# Patient Record
Sex: Male | Born: 1940 | Race: White | Hispanic: No | Smoking: Current every day smoker
Health system: Southern US, Community
[De-identification: ages and names within clinical notes are randomized; demographics above are authoritative.]

## PROBLEM LIST (undated history)

## (undated) DIAGNOSIS — R0600 Dyspnea, unspecified: Secondary | ICD-10-CM

## (undated) DIAGNOSIS — F419 Anxiety disorder, unspecified: Secondary | ICD-10-CM

## (undated) DIAGNOSIS — I10 Essential (primary) hypertension: Secondary | ICD-10-CM

## (undated) DIAGNOSIS — E7800 Pure hypercholesterolemia, unspecified: Secondary | ICD-10-CM

## (undated) DIAGNOSIS — R002 Palpitations: Secondary | ICD-10-CM

## (undated) DIAGNOSIS — K219 Gastro-esophageal reflux disease without esophagitis: Secondary | ICD-10-CM

## (undated) DIAGNOSIS — F32A Depression, unspecified: Secondary | ICD-10-CM

## (undated) DIAGNOSIS — F329 Major depressive disorder, single episode, unspecified: Secondary | ICD-10-CM

## (undated) HISTORY — PX: APPENDECTOMY: SHX54

## (undated) HISTORY — DX: Anxiety disorder, unspecified: F41.9

## (undated) HISTORY — DX: Palpitations: R00.2

## (undated) HISTORY — DX: Dyspnea, unspecified: R06.00

## (undated) HISTORY — DX: Depression, unspecified: F32.A

## (undated) HISTORY — DX: Major depressive disorder, single episode, unspecified: F32.9

---

## 2008-02-25 ENCOUNTER — Ambulatory Visit: Payer: Self-pay | Admitting: Cardiology

## 2008-03-02 ENCOUNTER — Ambulatory Visit: Payer: Self-pay | Admitting: Cardiology

## 2008-03-24 ENCOUNTER — Ambulatory Visit: Payer: Self-pay | Admitting: Cardiology

## 2008-04-06 ENCOUNTER — Ambulatory Visit: Payer: Self-pay | Admitting: Cardiology

## 2009-02-06 DIAGNOSIS — R002 Palpitations: Secondary | ICD-10-CM

## 2009-02-06 DIAGNOSIS — R0602 Shortness of breath: Secondary | ICD-10-CM

## 2010-07-02 ENCOUNTER — Ambulatory Visit: Admit: 2010-07-02 | Payer: Self-pay | Admitting: Internal Medicine

## 2010-10-15 NOTE — Assessment & Plan Note (Signed)
[facility]                          [facility] OFFICE NOTE   SHERRELL, FARISH                      MRN:          [id number]  DATE:02/25/2008                            DOB:          1940-08-16    [name] is a 70 year old gentleman, who I am asked to evaluate for  palpitations.  He also has dyspnea.  The patient has a long history of  palpitations occurring since he was a teenager.  They are described as a  skipping.   They also are described as a sudden flutter.  They do  not appear to be sustained.  They are not associated with any particular  activity.  There is no associated dyspnea, chest pain, or syncope.  He  does state that he feels nervous when he has these.  He has been taking  Inderal for years for these particular episodes.  They have increased in  frequency recently and we were asked to further evaluate.  Note, he does  have some dyspnea on exertion.  There is no orthopnea, PND, pedal edema,  chest pain, or history of syncope.   MEDICATIONS:  1. Inderal 60 mg p.o. b.i.d.  2. Flomax 0.4 mg p.o. daily.  3. Spiriva.  4. He also takes BC powders, sinus pill, tramadol, and Xanax as      needed.   ALLERGIES:  He has an allergy to PENICILLIN.   SOCIAL HISTORY:  He has a long history of tobacco abuse.  He does not  consume alcohol.  He does consume significant amounts of caffeine.   FAMILY HISTORY:  Significant for his father who had a myocardial  infarction, but this appears to be in his late 77s.  He has other  siblings who have palpitations.   PAST MEDICAL HISTORY:  There is no diabetes mellitus, hypertension, or  hyperlipidemia.  He does have COPD.  He has a history of anxiety and  depression.  He has had a previous appendectomy.   REVIEW OF SYSTEMS:  He denies any headaches or fevers or chills.  There  is no productive cough or hemoptysis.  There is no dysphagia,  odynophagia, melena, or hematochezia.  There is no  dysuria or hematuria.  There is no rashes or seizure activity.  There is no orthopnea, PND, or  pedal edema.  There is no claudication noted.  Remaining systems are  negative.   PHYSICAL EXAMINATION:  VITAL SIGNS:  Today, shows a blood pressure that  is elevated to 182/100.  His pulse is 78.  He weighs 129 pounds.  GENERAL:  He is well developed and somewhat frail.  He is in no acute  distress at present.  SKIN:  Warm and dry.  He does not appear to be depressed.  There is no  peripheral clubbing.  BACK:  Normal.  Note, he does have some staining on his fingers from his tobacco use.  HEENT:  Normal with normal eyelids.  NECK:  Supple with normal upstroke bilaterally.  No bruits noted.  There  is no jugular venous distention.  I cannot appreciate thyromegaly.  CHEST:  Diminished breath sounds throughout with a mild expiratory  wheeze.  CARDIOVASCULAR:  Regular rate and rhythm.  Normal S1 and S2.  No  murmurs, rubs, or gallops noted.  ABDOMEN:  Nontender and nondistended.  Positive bowel sounds.  No  hepatosplenomegaly.  No mass appreciated.  There is no abdominal bruit.  He has 2+ femoral pulses bilaterally.  No bruits.  EXTREMITIES:  No edema.  I could palpate no cords.  Distal pulses are  diminished.  NEUROLOGIC:  Grossly intact.   His electrocardiogram shows a sinus rhythm at a rate of 83.  The axis is  normal.  There are no significant ST changes.   DIAGNOSES:  1. Palpitations - we will plan to proceed with a CardioNet monitor to      more fully evaluate, although these sound to be most likely      premature ventricular contractions.  We will also schedule him to      have an echocardiogram to quantify his left ventricular function,      as well as a BMET and a TSH.  I will increase his Inderal to 120 mg      p.o. b.i.d., both blood pressure and his palpitations.  We will see      him back in 6 weeks to review the above.  2. Dyspnea - this is most likely due to his chronic  obstructive      pulmonary disease.  3. Tobacco abuse - we discussed the importance of discontinuing this      for between 3-10 minutes.  4. Anxiety/depression.     Madolyn Frieze Jens Som, MD, [facility]  Electronically Signed    BSC/MedQ  DD: 02/25/2008  DT: 02/25/2008  Job #: [id number]   cc:   Samuel Jester

## 2011-11-19 ENCOUNTER — Encounter: Payer: Self-pay | Admitting: *Deleted

## 2012-04-19 ENCOUNTER — Encounter (HOSPITAL_COMMUNITY): Payer: Self-pay | Admitting: Pharmacy Technician

## 2012-04-23 NOTE — Patient Instructions (Addendum)
Your procedure is scheduled on:  05/06/2012  Report to [facility] at   6;15   AM.  Call this number if you have problems the morning of surgery: [phone]   Remember:   Do not eat or drink :After Midnight.    Take these medicines the morning of surgery with A SIP OF WATER: Pantoprazole, propranolol, and Spiriva   Do not wear jewelry, make-up or nail polish.  Do not wear lotions, powders, or perfumes. You may wear deodorant.  Do not shave 48 hours prior to surgery.  Do not bring valuables to the hospital.  Contacts, dentures or bridgework may not be worn into surgery.  Patients discharged the day of surgery will not be allowed to drive home.  Name and phone number of your driver:    Please read over the following fact sheets that you were given: Pain Booklet, Surgical Site Infection Prevention, Anesthesia Post-op Instructions and Care and Recovery After Surgery  Cataract Surgery  A cataract is a clouding of the lens of the eye. When a lens becomes cloudy, vision is reduced based on the degree and nature of the clouding. Surgery may be needed to improve vision. Surgery removes the cloudy lens and usually replaces it with a substitute lens (intraocular lens, IOL). LET YOUR EYE DOCTOR KNOW ABOUT:  Allergies to food or medicine.   Medicines taken including herbs, eyedrops, over-the-counter medicines, and creams.   Use of steroids (by mouth or creams).   Previous problems with anesthetics or numbing medicine.   History of bleeding problems or blood clots.   Previous surgery.   Other health problems, including diabetes and kidney problems.   Possibility of pregnancy, if this applies.  RISKS AND COMPLICATIONS  Infection.   Inflammation of the eyeball (endophthalmitis) that can spread to both eyes (sympathetic ophthalmia).   Poor wound healing.   If an IOL is inserted, it can later fall out of proper position. This is very uncommon.   Clouding of the part of your eye that  holds an IOL in place. This is called an "after-cataract." These are uncommon, but easily treated.  BEFORE THE PROCEDURE  Do not eat or drink anything except small amounts of water for 8 to 12 before your surgery, or as directed by your caregiver.   Unless you are told otherwise, continue any eyedrops you have been prescribed.   Talk to your primary caregiver about all other medicines that you take (both prescription and non-prescription). In some cases, you may need to stop or change medicines near the time of your surgery. This is most important if you are taking blood-thinning medicine.Do not stop medicines unless you are told to do so.   Arrange for someone to drive you to and from the procedure.   Do not put contact lenses in either eye on the day of your surgery.  PROCEDURE There is more than one method for safely removing a cataract. Your doctor can explain the differences and help determine which is best for you. Phacoemulsification surgery is the most common form of cataract surgery.  An injection is given behind the eye or eyedrops are given to make this a painless procedure.   A small cut (incision) is made on the edge of the clear, dome-shaped surface that covers the front of the eye (cornea).   A tiny probe is painlessly inserted into the eye. This device gives off ultrasound waves that soften and break up the cloudy center of the lens. This makes  it easier for the cloudy lens to be removed by suction.   An IOL may be implanted.   The normal lens of the eye is covered by a clear capsule. Part of that capsule is intentionally left in the eye to support the IOL.   Your surgeon may or may not use stitches to close the incision.  There are other forms of cataract surgery that require a larger incision and stiches to close the eye. This approach is taken in cases where the doctor feels that the cataract cannot be easily removed using phacoemulsification. AFTER THE  PROCEDURE  When an IOL is implanted, it does not need care. It becomes a permanent part of your eye and cannot be seen or felt.   Your doctor will schedule follow-up exams to check on your progress.   Review your other medicines with your doctor to see which can be resumed after surgery.   Use eyedrops or take medicine as prescribed by your doctor.  Document Released: 05/08/2011 Document Reviewed: 05/05/2011 [facility] Patient Information 2012 [place].  .Cataract Surgery Care After Refer to this sheet in the next few weeks. These instructions provide you with information on caring for yourself after your procedure. Your caregiver may also give you more specific instructions. Your treatment has been planned according to current medical practices, but problems sometimes occur. Call your caregiver if you have any problems or questions after your procedure.  HOME CARE INSTRUCTIONS   Avoid strenuous activities as directed by your caregiver.   Ask your caregiver when you can resume driving.   Use eyedrops or other medicines to help healing and control pressure inside your eye as directed by your caregiver.   Only take over-the-counter or prescription medicines for pain, discomfort, or fever as directed by your caregiver.   Do not to touch or rub your eyes.   You may be instructed to use a protective shield during the first few days and nights after surgery. If not, wear sunglasses to protect your eyes. This is to protect the eye from pressure or from being accidentally bumped.   Keep the area around your eye clean and dry. Avoid swimming or allowing water to hit you directly in the face while showering. Keep soap and shampoo out of your eyes.   Do not bend or lift heavy objects. Bending increases pressure in the eye. You can walk, climb stairs, and do light household chores.   Do not put a contact lens into the eye that had surgery until your caregiver says it is okay to do so.    Ask your doctor when you can return to work. This will depend on the kind of work that you do. If you work in a dusty environment, you may be advised to wear protective eyewear for a period of time.   Ask your caregiver when it will be safe to engage in sexual activity.   Continue with your regular eye exams as directed by your caregiver.  What to expect:  It is normal to feel itching and mild discomfort for a few days after cataract surgery. Some fluid discharge is also common, and your eye may be sensitive to light and touch.   After 1 to 2 days, even moderate discomfort should disappear. In most cases, healing will take about 6 weeks.   If you received an intraocular lens (IOL), you may notice that colors are very bright or have a blue tinge. Also, if you have been in bright sunlight,  everything may appear reddish for a few hours. If you see these color tinges, it is because your lens is clear and no longer cloudy. Within a few months after receiving an IOL, these extra colors should go away. When you have healed, you will probably need new glasses.  SEEK MEDICAL CARE IF:   You have increased bruising around your eye.   You have discomfort not helped by medicine.  SEEK IMMEDIATE MEDICAL CARE IF:   You have a fever.   You have a worsening or sudden vision loss.   You have redness, swelling, or increasing pain in the eye.   You have a thick discharge from the eye that had surgery.  MAKE SURE YOU:  Understand these instructions.   Will watch your condition.   Will get help right away if you are not doing well or get worse.  Document Released: 12/06/2004 Document Revised: 05/08/2011 Document Reviewed: 01/10/2011 [facility] Patient Information 2012 [place], [place].

## 2012-04-26 ENCOUNTER — Encounter (HOSPITAL_COMMUNITY): Payer: Self-pay

## 2012-04-26 ENCOUNTER — Other Ambulatory Visit: Payer: Self-pay

## 2012-04-26 ENCOUNTER — Encounter (HOSPITAL_COMMUNITY)
Admission: RE | Admit: 2012-04-26 | Discharge: 2012-04-26 | Disposition: A | Discharge status: Home / Self Care | Payer: Medicare Other | Source: Ambulatory Visit | Attending: Ophthalmology | Admitting: Ophthalmology

## 2012-04-26 HISTORY — DX: Essential (primary) hypertension: I10

## 2012-04-26 HISTORY — DX: Gastro-esophageal reflux disease without esophagitis: K21.9

## 2012-04-26 HISTORY — DX: Pure hypercholesterolemia, unspecified: E78.00

## 2012-04-26 LAB — HEMOGLOBIN AND HEMATOCRIT, BLOOD: Hemoglobin: 13.6 g/dL (ref 13.0–17.0)

## 2012-04-26 LAB — BASIC METABOLIC PANEL
BUN: 12 mg/dL (ref 6–23)
Calcium: 9.5 mg/dL (ref 8.4–10.5)
GFR calc non Af Amer: 88 mL/min — ABNORMAL LOW (ref >90)
Glucose, Bld: 123 mg/dL — ABNORMAL HIGH (ref 70–99)

## 2012-05-05 MED ORDER — TETRACAINE HCL 0.5 % OP SOLN
OPHTHALMIC | Status: AC
  Filled 2012-05-05: qty 2

## 2012-05-05 MED ORDER — CYCLOPENTOLATE-PHENYLEPHRINE 0.2-1 % OP SOLN
OPHTHALMIC | Status: AC
  Filled 2012-05-05: qty 2

## 2012-05-05 MED ORDER — LIDOCAINE HCL (PF) 1 % IJ SOLN
INTRAMUSCULAR | Status: AC
  Filled 2012-05-05: qty 2

## 2012-05-05 MED ORDER — NEOMYCIN-POLYMYXIN-DEXAMETH 3.5-10000-0.1 OP OINT
TOPICAL_OINTMENT | OPHTHALMIC | Status: AC
  Filled 2012-05-05: qty 3.5

## 2012-05-05 MED ORDER — PHENYLEPHRINE HCL 2.5 % OP SOLN
OPHTHALMIC | Status: AC
  Filled 2012-05-05: qty 2

## 2012-05-05 MED ORDER — LIDOCAINE HCL 3.5 % OP GEL
OPHTHALMIC | Status: AC
  Filled 2012-05-05: qty 5

## 2012-05-06 ENCOUNTER — Encounter (HOSPITAL_COMMUNITY): Payer: Self-pay | Admitting: Anesthesiology

## 2012-05-06 ENCOUNTER — Ambulatory Visit (HOSPITAL_COMMUNITY): Payer: Medicare Other | Admitting: Anesthesiology

## 2012-05-06 ENCOUNTER — Encounter (HOSPITAL_COMMUNITY)
Admission: RE | Disposition: A | Discharge status: Home / Self Care | Payer: Self-pay | Source: Ambulatory Visit | Attending: Ophthalmology

## 2012-05-06 ENCOUNTER — Encounter (HOSPITAL_COMMUNITY): Payer: Self-pay | Admitting: *Deleted

## 2012-05-06 ENCOUNTER — Ambulatory Visit (HOSPITAL_COMMUNITY)
Admission: RE | Admit: 2012-05-06 | Discharge: 2012-05-06 | Disposition: A | Discharge status: Home / Self Care | Payer: Medicare Other | Source: Ambulatory Visit | Attending: Ophthalmology | Admitting: Ophthalmology

## 2012-05-06 DIAGNOSIS — Z01812 Encounter for preprocedural laboratory examination: Secondary | ICD-10-CM | POA: Insufficient documentation

## 2012-05-06 DIAGNOSIS — I10 Essential (primary) hypertension: Secondary | ICD-10-CM | POA: Insufficient documentation

## 2012-05-06 DIAGNOSIS — J4489 Other specified chronic obstructive pulmonary disease: Secondary | ICD-10-CM | POA: Insufficient documentation

## 2012-05-06 DIAGNOSIS — Z01810 Encounter for preprocedural cardiovascular examination: Secondary | ICD-10-CM | POA: Insufficient documentation

## 2012-05-06 DIAGNOSIS — H2589 Other age-related cataract: Secondary | ICD-10-CM | POA: Insufficient documentation

## 2012-05-06 DIAGNOSIS — J449 Chronic obstructive pulmonary disease, unspecified: Secondary | ICD-10-CM | POA: Insufficient documentation

## 2012-05-06 DIAGNOSIS — F17200 Nicotine dependence, unspecified, uncomplicated: Secondary | ICD-10-CM | POA: Insufficient documentation

## 2012-05-06 HISTORY — PX: CATARACT EXTRACTION W/PHACO: SHX586

## 2012-05-06 SURGERY — PHACOEMULSIFICATION, CATARACT, WITH IOL INSERTION
Anesthesia: Monitor Anesthesia Care | Site: Eye | Laterality: Right | Wound class: Clean

## 2012-05-06 MED ORDER — LIDOCAINE HCL (PF) 1 % IJ SOLN: INTRAMUSCULAR | Status: DC | PRN

## 2012-05-06 MED ORDER — MIDAZOLAM HCL 2 MG/2ML IJ SOLN
1.0000 mg | INTRAMUSCULAR | Status: DC | PRN
  Administered 2012-05-06 (×2): 2 mg via INTRAVENOUS

## 2012-05-06 MED ORDER — MIDAZOLAM HCL 2 MG/2ML IJ SOLN
INTRAMUSCULAR | Status: AC
  Filled 2012-05-06: qty 2

## 2012-05-06 MED ORDER — MIDAZOLAM HCL 2 MG/2ML IJ SOLN
INTRAMUSCULAR | Status: AC
  Filled 2012-05-06: qty 28

## 2012-05-06 MED ORDER — EPINEPHRINE HCL 1 MG/ML IJ SOLN
INTRAOCULAR | Status: DC | PRN
  Administered 2012-05-06: 07:00:00

## 2012-05-06 MED ORDER — PROVISC 10 MG/ML IO SOLN
INTRAOCULAR | Status: DC | PRN
  Administered 2012-05-06: 8.5 mg via INTRAOCULAR

## 2012-05-06 MED ORDER — TETRACAINE HCL 0.5 % OP SOLN
1.0000 [drp] | OPHTHALMIC | Status: AC
  Administered 2012-05-06 (×3): 1 [drp] via OPHTHALMIC

## 2012-05-06 MED ORDER — LIDOCAINE HCL 3.5 % OP GEL
1.0000 "application " | Freq: Once | OPHTHALMIC | Status: AC
  Administered 2012-05-06: 1 via OPHTHALMIC

## 2012-05-06 MED ORDER — PHENYLEPHRINE HCL 2.5 % OP SOLN
1.0000 [drp] | OPHTHALMIC | Status: AC
  Administered 2012-05-06 (×3): 1 [drp] via OPHTHALMIC

## 2012-05-06 MED ORDER — CYCLOPENTOLATE-PHENYLEPHRINE 0.2-1 % OP SOLN
1.0000 [drp] | OPHTHALMIC | Status: AC
  Administered 2012-05-06 (×3): 1 [drp] via OPHTHALMIC

## 2012-05-06 MED ORDER — BSS IO SOLN
INTRAOCULAR | Status: DC | PRN
  Administered 2012-05-06: 15 mL via INTRAOCULAR

## 2012-05-06 MED ORDER — LIDOCAINE HCL (PF) 1 % IJ SOLN
INTRAOCULAR | Status: DC | PRN
  Administered 2012-05-06: 07:00:00 via OPHTHALMIC

## 2012-05-06 MED ORDER — POVIDONE-IODINE 5 % OP SOLN
OPHTHALMIC | Status: DC | PRN
  Administered 2012-05-06: 1 via OPHTHALMIC

## 2012-05-06 MED ORDER — EPINEPHRINE HCL 1 MG/ML IJ SOLN
INTRAMUSCULAR | Status: AC
  Filled 2012-05-06: qty 1

## 2012-05-06 MED ORDER — NEOMYCIN-POLYMYXIN-DEXAMETH 0.1 % OP OINT
TOPICAL_OINTMENT | OPHTHALMIC | Status: DC | PRN
  Administered 2012-05-06: 1 via OPHTHALMIC

## 2012-05-06 MED ORDER — LACTATED RINGERS IV SOLN
INTRAVENOUS | Status: DC
  Administered 2012-05-06: 1000 mL via INTRAVENOUS

## 2012-05-06 MED ORDER — LIDOCAINE 3.5 % OP GEL OPTIME - NO CHARGE
OPHTHALMIC | Status: DC | PRN
  Administered 2012-05-06: 1 [drp] via OPHTHALMIC

## 2012-05-06 SURGICAL SUPPLY — 32 items

## 2012-05-06 NOTE — Transfer of Care (Signed)
Immediate Anesthesia Transfer of Care Note  Patient: [name]  Procedure(s) Performed: Procedure(s) (LRB) with comments: CATARACT EXTRACTION PHACO AND INTRAOCULAR LENS PLACEMENT (IOC) (Right) - CDE:22.64  Patient Location: PACU and Short Stay  Anesthesia Type:MAC  Level of Consciousness: awake, alert , oriented and patient cooperative  Airway & Oxygen Therapy: Patient Spontanous Breathing  Post-op Assessment: Report given to PACU RN, Post -op Vital signs reviewed and stable and Patient moving all extremities  Post vital signs: Reviewed and stable  Complications: No apparent anesthesia complications

## 2012-05-06 NOTE — Brief Op Note (Signed)
Pre-Op Dx: Cataract OD Post-Op Dx: Cataract OD Surgeon: Kenlyn Lose Anesthesia: Topical with MAC Surgery: Cataract Extraction with Intraocular lens Implant OD Implant: B&L enVista Specimen: None Complications: None 

## 2012-05-06 NOTE — Op Note (Signed)
[name], [name]               ACCOUNT NO.:  [id number]  MEDICAL RECORD NO.:  [id number]  LOCATION:  [facility]                          FACILITY:  [facility]  PHYSICIAN:  Susanne Greenhouse, MD       DATE OF BIRTH:  Mar 24, 1941  DATE OF PROCEDURE:  05/06/2012 DATE OF DISCHARGE:  05/06/2012                              OPERATIVE REPORT   PREOPERATIVE DIAGNOSIS:  Combined cataract, right eye, diagnosis code 366.19.  POSTOPERATIVE DIAGNOSIS:  Combined cataract, right eye, diagnosis code 366.19.  OPERATION PERFORMED:  Phacoemulsification with posterior chamber intraocular lens implantation, right eye.  SURGEON:  Bonne Dolores. Theodoro Koval, MD  ANESTHESIA:  Topical with monitored anesthesia care and IV sedation.  OPERATIVE SUMMARY:  In the preoperative area, dilating drops were placed into the right eye.  The patient was then brought into the operating room where he was placed under general anesthesia.  The eye was then prepped and draped.  Beginning with a 75 blade, a paracentesis port was made at the surgeon's 2 o'clock position.  The anterior chamber was then filled with a 1% nonpreserved lidocaine solution with epinephrine.  This was followed by Viscoat to deepen the chamber.  A small fornix-based peritomy was performed superiorly.  Next, a single iris hook was placed through the limbus superiorly.  A 2.4-mm keratome blade was then used to make a clear corneal incision over the iris hook.  A bent cystotome needle and Utrata forceps were used to create a continuous tear capsulotomy.  Hydrodissection was performed using balanced salt solution on a fine cannula.  The lens nucleus was then removed using phacoemulsification in a quadrant cracking technique.  The cortical material was then removed with irrigation and aspiration.  The capsular bag and anterior chamber were refilled with Provisc.  The wound was widened to approximately 3 mm and a posterior chamber intraocular lens was placed into the capsular  bag without difficulty using an [employer] lens injecting system.  A single 10-0 nylon suture was then used to close the incision as well as stromal hydration.  The Provisc was removed from the anterior chamber and capsular bag with irrigation and aspiration.  At this point, the wounds were tested for leak, which were negative.  The anterior chamber remained deep and stable.  The patient tolerated the procedure well.  There were no operative complications, and he awoke from general anesthesia without problem.  No surgical specimens.  Prosthetic device used is a Theme park manager, model EnVista, model number MX60, power of 23.0, serial number is [id number].          ______________________________ Susanne Greenhouse, MD     KEH/MEDQ  D:  05/06/2012  T:  05/06/2012  Job:  [id number]

## 2012-05-06 NOTE — Anesthesia Postprocedure Evaluation (Signed)
  Anesthesia Post-op Note  Patient: [name]  Procedure(s) Performed: Procedure(s) (LRB) with comments: CATARACT EXTRACTION PHACO AND INTRAOCULAR LENS PLACEMENT (IOC) (Right) - CDE:22.64  Patient Location: Short Stay  Anesthesia Type:MAC  Level of Consciousness: awake, alert , oriented and patient cooperative  Airway and Oxygen Therapy: Patient Spontanous Breathing  Post-op Pain: none  Post-op Assessment: Post-op Vital signs reviewed, Patient's Cardiovascular Status Stable, Respiratory Function Stable, Patent Airway, No signs of Nausea or vomiting and Pain level controlled  Post-op Vital Signs: Reviewed and stable  Complications: No apparent anesthesia complications

## 2012-05-06 NOTE — H&P (Signed)
I have reviewed the H&P, the patient was re-examined, and I have identified no interval changes in medical condition and plan of care since the history and physical of record  

## 2012-05-06 NOTE — Anesthesia Preprocedure Evaluation (Addendum)
Anesthesia Evaluation  Patient identified by MRN, date of birth, ID band Patient awake    Reviewed: Allergy & Precautions, H&P , NPO status , Patient's Chart, lab work & pertinent test results, reviewed documented beta blocker date and time   Airway Mallampati: II TM Distance: <3 FB     Dental  (+) Edentulous Upper and Edentulous Lower   Pulmonary shortness of breath, COPDCurrent Smoker,  breath sounds clear to auscultation  - wheezing      Cardiovascular hypertension, Pt. on home beta blockers and Pt. on medications + dysrhythmias (palpitations) Rhythm:Regular     Neuro/Psych PSYCHIATRIC DISORDERS Anxiety Depression    GI/Hepatic GERD-  Medicated,  Endo/Other    Renal/GU      Musculoskeletal   Abdominal   Peds  Hematology   Anesthesia Other Findings   Reproductive/Obstetrics                           Anesthesia Physical Anesthesia Plan  ASA: III  Anesthesia Plan: MAC   Post-op Pain Management:    Induction: Intravenous  Airway Management Planned: Nasal Cannula  Additional Equipment:   Intra-op Plan:   Post-operative Plan:   Informed Consent: I have reviewed the patients History and Physical, chart, labs and discussed the procedure including the risks, benefits and alternatives for the proposed anesthesia with the patient or authorized representative who has indicated his/her understanding and acceptance.     Plan Discussed with:   Anesthesia Plan Comments:         Anesthesia Quick Evaluation  

## 2012-05-10 ENCOUNTER — Encounter (HOSPITAL_COMMUNITY): Payer: Self-pay | Admitting: Ophthalmology

## 2012-05-13 ENCOUNTER — Encounter (HOSPITAL_COMMUNITY): Payer: Self-pay | Admitting: Pharmacy Technician

## 2012-05-18 ENCOUNTER — Encounter (HOSPITAL_COMMUNITY)
Admission: RE | Admit: 2012-05-18 | Discharge: 2012-05-18 | Payer: Medicare Other | Source: Ambulatory Visit | Admitting: Ophthalmology

## 2012-05-18 MED ORDER — FENTANYL CITRATE 0.05 MG/ML IJ SOLN: 25.0000 ug | INTRAMUSCULAR | Status: DC | PRN

## 2012-05-18 MED ORDER — ONDANSETRON HCL 4 MG/2ML IJ SOLN: 4.0000 mg | Freq: Once | INTRAMUSCULAR | Status: AC | PRN

## 2012-05-18 NOTE — Patient Instructions (Signed)
22 [name]  05/18/2012   Your procedure is scheduled on:  05/24/2012  Report to [facility] at 6:30 AM.  Call this number if you have problems the morning of surgery: [phone]   Remember:   Do not eat food:After Midnight.  May have clear liquids:until Midnight .  Clear liquids include soda, tea, black coffee, apple or grape juice, broth.  Take these medicines the morning of surgery with A SIP OF WATER: Spiriva, Flomax, Inderal, Protonix, and Lexapro   Do not wear jewelry, make-up or nail polish.  Do not wear lotions, powders, or perfumes. You may wear deodorant.  Do not shave 48 hours prior to surgery. Men may shave face and neck.  Do not bring valuables to the hospital.  Contacts, dentures or bridgework may not be worn into surgery.  Leave suitcase in the car. After surgery it may be brought to your room.  For patients admitted to the hospital, checkout time is 11:00 AM the day of discharge.   Patients discharged the day of surgery will not be allowed to drive home.  Name and phone number of your driver: Family  Special Instructions: N/A   Please read over the following fact sheets that you were given: Care and Recovery After Surgery

## 2012-05-24 ENCOUNTER — Ambulatory Visit (HOSPITAL_COMMUNITY)
Admission: RE | Admit: 2012-05-24 | Discharge: 2012-05-24 | Disposition: A | Discharge status: Home / Self Care | Payer: Medicare Other | Source: Ambulatory Visit | Attending: Ophthalmology | Admitting: Ophthalmology

## 2012-05-24 ENCOUNTER — Encounter (HOSPITAL_COMMUNITY): Payer: Self-pay | Admitting: *Deleted

## 2012-05-24 ENCOUNTER — Encounter (HOSPITAL_COMMUNITY): Payer: Self-pay | Admitting: Anesthesiology

## 2012-05-24 ENCOUNTER — Encounter (HOSPITAL_COMMUNITY)
Admission: RE | Disposition: A | Discharge status: Home / Self Care | Payer: Self-pay | Source: Ambulatory Visit | Attending: Ophthalmology

## 2012-05-24 ENCOUNTER — Ambulatory Visit (HOSPITAL_COMMUNITY): Payer: Medicare Other | Admitting: Anesthesiology

## 2012-05-24 DIAGNOSIS — F17200 Nicotine dependence, unspecified, uncomplicated: Secondary | ICD-10-CM | POA: Insufficient documentation

## 2012-05-24 DIAGNOSIS — I10 Essential (primary) hypertension: Secondary | ICD-10-CM | POA: Insufficient documentation

## 2012-05-24 DIAGNOSIS — H2589 Other age-related cataract: Secondary | ICD-10-CM | POA: Insufficient documentation

## 2012-05-24 DIAGNOSIS — J4489 Other specified chronic obstructive pulmonary disease: Secondary | ICD-10-CM | POA: Insufficient documentation

## 2012-05-24 DIAGNOSIS — J449 Chronic obstructive pulmonary disease, unspecified: Secondary | ICD-10-CM | POA: Insufficient documentation

## 2012-05-24 HISTORY — PX: CATARACT EXTRACTION W/PHACO: SHX586

## 2012-05-24 SURGERY — PHACOEMULSIFICATION, CATARACT, WITH IOL INSERTION
Anesthesia: Monitor Anesthesia Care | Site: Eye | Laterality: Left | Wound class: Clean

## 2012-05-24 MED ORDER — LACTATED RINGERS IV SOLN
INTRAVENOUS | Status: DC
  Administered 2012-05-24: 1000 mL via INTRAVENOUS

## 2012-05-24 MED ORDER — CYCLOPENTOLATE-PHENYLEPHRINE 0.2-1 % OP SOLN
1.0000 [drp] | OPHTHALMIC | Status: AC
  Administered 2012-05-24 (×3): 1 [drp] via OPHTHALMIC

## 2012-05-24 MED ORDER — EPINEPHRINE HCL 1 MG/ML IJ SOLN
INTRAMUSCULAR | Status: AC
  Filled 2012-05-24: qty 1

## 2012-05-24 MED ORDER — POVIDONE-IODINE 5 % OP SOLN
OPHTHALMIC | Status: DC | PRN
  Administered 2012-05-24: 1 via OPHTHALMIC

## 2012-05-24 MED ORDER — LIDOCAINE HCL (PF) 1 % IJ SOLN: INTRAMUSCULAR | Status: DC | PRN

## 2012-05-24 MED ORDER — EPINEPHRINE HCL 1 MG/ML IJ SOLN
INTRAOCULAR | Status: DC | PRN
  Administered 2012-05-24: 08:00:00

## 2012-05-24 MED ORDER — TETRACAINE HCL 0.5 % OP SOLN
1.0000 [drp] | OPHTHALMIC | Status: AC
  Administered 2012-05-24 (×3): 1 [drp] via OPHTHALMIC

## 2012-05-24 MED ORDER — MIDAZOLAM HCL 2 MG/2ML IJ SOLN
INTRAMUSCULAR | Status: AC
  Filled 2012-05-24: qty 2

## 2012-05-24 MED ORDER — PHENYLEPHRINE HCL 2.5 % OP SOLN
1.0000 [drp] | OPHTHALMIC | Status: AC
  Administered 2012-05-24 (×3): 1 [drp] via OPHTHALMIC

## 2012-05-24 MED ORDER — LIDOCAINE HCL (PF) 1 % IJ SOLN
INTRAOCULAR | Status: DC | PRN
  Administered 2012-05-24: 08:00:00 via OPHTHALMIC

## 2012-05-24 MED ORDER — BSS IO SOLN
INTRAOCULAR | Status: DC | PRN
  Administered 2012-05-24: 15 mL via INTRAOCULAR

## 2012-05-24 MED ORDER — MIDAZOLAM HCL 2 MG/2ML IJ SOLN
1.0000 mg | INTRAMUSCULAR | Status: DC | PRN
  Administered 2012-05-24 (×2): 2 mg via INTRAVENOUS

## 2012-05-24 MED ORDER — LIDOCAINE HCL 3.5 % OP GEL
1.0000 "application " | Freq: Once | OPHTHALMIC | Status: AC
  Administered 2012-05-24: 1 via OPHTHALMIC

## 2012-05-24 MED ORDER — PROVISC 10 MG/ML IO SOLN
INTRAOCULAR | Status: DC | PRN
  Administered 2012-05-24: 8.5 mg via INTRAOCULAR

## 2012-05-24 MED ORDER — NEOMYCIN-POLYMYXIN-DEXAMETH 0.1 % OP OINT
TOPICAL_OINTMENT | OPHTHALMIC | Status: DC | PRN
  Administered 2012-05-24: 1 via OPHTHALMIC

## 2012-05-24 SURGICAL SUPPLY — 32 items

## 2012-05-24 NOTE — Anesthesia Postprocedure Evaluation (Signed)
  Anesthesia Post-op Note  Patient: [name]  Procedure(s) Performed: Procedure(s) (LRB): CATARACT EXTRACTION PHACO AND INTRAOCULAR LENS PLACEMENT (IOC) (Left)  Patient Location:  Short Stay  Anesthesia Type: MAC  Level of Consciousness: awake  Airway and Oxygen Therapy: Patient Spontanous Breathing  Post-op Pain: none  Post-op Assessment: Post-op Vital signs reviewed, Patient's Cardiovascular Status Stable, Respiratory Function Stable, Patent Airway, No signs of Nausea or vomiting and Pain level controlled  Post-op Vital Signs: Reviewed and stable  Complications: No apparent anesthesia complications

## 2012-05-24 NOTE — Anesthesia Preprocedure Evaluation (Signed)
Anesthesia Evaluation  Patient identified by MRN, date of birth, ID band Patient awake    Reviewed: Allergy & Precautions, H&P , NPO status , Patient's Chart, lab work & pertinent test results, reviewed documented beta blocker date and time   Airway Mallampati: II TM Distance: <3 FB     Dental  (+) Edentulous Upper and Edentulous Lower   Pulmonary shortness of breath, COPDCurrent Smoker,  breath sounds clear to auscultation  - wheezing      Cardiovascular hypertension, Pt. on home beta blockers and Pt. on medications + dysrhythmias (palpitations) Rhythm:Regular     Neuro/Psych PSYCHIATRIC DISORDERS Anxiety Depression    GI/Hepatic GERD-  Medicated,  Endo/Other    Renal/GU      Musculoskeletal   Abdominal   Peds  Hematology   Anesthesia Other Findings   Reproductive/Obstetrics                           Anesthesia Physical Anesthesia Plan  ASA: III  Anesthesia Plan: MAC   Post-op Pain Management:    Induction: Intravenous  Airway Management Planned: Nasal Cannula  Additional Equipment:   Intra-op Plan:   Post-operative Plan:   Informed Consent: I have reviewed the patients History and Physical, chart, labs and discussed the procedure including the risks, benefits and alternatives for the proposed anesthesia with the patient or authorized representative who has indicated his/her understanding and acceptance.     Plan Discussed with:   Anesthesia Plan Comments:         Anesthesia Quick Evaluation

## 2012-05-24 NOTE — Anesthesia Procedure Notes (Signed)
Procedure Name: MAC Date/Time: 05/24/2012 7:50 AM Performed by: Franco Nones Pre-anesthesia Checklist: Patient identified, Emergency Drugs available, Suction available, Timeout performed and Patient being monitored Patient Re-evaluated:Patient Re-evaluated prior to inductionOxygen Delivery Method: Nasal Cannula

## 2012-05-24 NOTE — Op Note (Signed)
[name], [name]               ACCOUNT NO.:  [id number]  MEDICAL RECORD NO.:  [id number]  LOCATION:  [facility]                          FACILITY:  [facility]  PHYSICIAN:  Susanne Greenhouse, MD       DATE OF BIRTH:  01-03-1941  DATE OF PROCEDURE:  05/24/2012 DATE OF DISCHARGE:  05/24/2012                              OPERATIVE REPORT   PREOPERATIVE DIAGNOSIS:  Combined cataract, left eye, diagnosis code 366.19.  POSTOPERATIVE DIAGNOSIS:  Combined cataract, left eye, diagnosis code 366.19.  OPERATION PERFORMED:  Phacoemulsification with posterior chamber intraocular lens implantation, left eye.  SURGEON:  Bonne Dolores. Leary Mcnulty, MD  ANESTHESIA:  Topical with monitored anesthesia care and IV sedation.  OPERATIVE SUMMARY:  In the preoperative area, dilating drops were placed into the left eye.  The patient was then brought into the operating room where he was placed under general anesthesia.  The eye was then prepped and draped.  Beginning with a 75 blade, a paracentesis port was made at the surgeon's 2 o'clock position.  The anterior chamber was then filled with a 1% nonpreserved lidocaine solution with epinephrine.  This was followed by Viscoat to deepen the chamber.  A small fornix-based peritomy was performed superiorly.  Next, a single iris hook was placed through the limbus superiorly.  A 2.4-mm keratome blade was then used to make a clear corneal incision over the iris hook.  A bent cystotome needle and Utrata forceps were used to create a continuous tear capsulotomy.  Hydrodissection was performed using balanced salt solution on a fine cannula.  The lens nucleus was then removed using phacoemulsification in a quadrant cracking technique.  The cortical material was then removed with irrigation and aspiration.  The capsular bag and anterior chamber were refilled with Provisc.  The wound was widened to approximately 3 mm and a posterior chamber intraocular lens was placed into the capsular bag  without difficulty using an [employer] lens injecting system.  A single 10-0 nylon suture was then used to close the incision as well as stromal hydration.  The Provisc was removed from the anterior chamber and capsular bag with irrigation and aspiration.  At this point, the wounds were tested for leak, which were negative.  The anterior chamber remained deep and stable.  The patient tolerated the procedure well.  There were no operative complications, and he awoke from general anesthesia without problem.  No surgical specimens.  Prosthetic device used is a Theme park manager, model EnVista, model number MX60, power of 22.5, serial number is [id number].          ______________________________ Susanne Greenhouse, MD     KEH/MEDQ  D:  05/24/2012  T:  05/24/2012  Job:  [id number]

## 2012-05-24 NOTE — H&P (Signed)
I have reviewed the H&P, the patient was re-examined, and I have identified no interval changes in medical condition and plan of care since the history and physical of record  

## 2012-05-24 NOTE — Brief Op Note (Signed)
Pre-Op Dx: Cataract OS Post-Op Dx: Cataract OS Surgeon: Nelson Noone Anesthesia: Topical with MAC Surgery: Cataract Extraction with Intraocular lens Implant OS Implant: B&L enVista Specimen: None Complications: None 

## 2012-05-24 NOTE — Transfer of Care (Signed)
Immediate Anesthesia Transfer of Care Note  Patient: [name]  Procedure(s) Performed: Procedure(s) (LRB): CATARACT EXTRACTION PHACO AND INTRAOCULAR LENS PLACEMENT (IOC) (Left)  Patient Location: Shortstay  Anesthesia Type: MAC  Level of Consciousness: awake  Airway & Oxygen Therapy: Patient Spontanous Breathing   Post-op Assessment: Report given to PACU RN, Post -op Vital signs reviewed and stable and Patient moving all extremities  Post vital signs: Reviewed and stable  Complications: No apparent anesthesia complications

## 2012-05-31 ENCOUNTER — Encounter (HOSPITAL_COMMUNITY): Payer: Self-pay | Admitting: Ophthalmology

## 2014-09-13 ENCOUNTER — Inpatient Hospital Stay (HOSPITAL_COMMUNITY): Payer: Medicare Other | Admitting: Anesthesiology

## 2014-09-13 ENCOUNTER — Encounter (HOSPITAL_COMMUNITY)
Admission: EM | Disposition: A | Discharge status: Nursing Home (Includes ICF) | Payer: Medicare Other | Source: Home / Self Care | Attending: Cardiothoracic Surgery

## 2014-09-13 ENCOUNTER — Ambulatory Visit (HOSPITAL_COMMUNITY): Admit: 2014-09-13 | Payer: Self-pay | Admitting: Interventional Cardiology

## 2014-09-13 ENCOUNTER — Encounter (HOSPITAL_COMMUNITY): Payer: Self-pay

## 2014-09-13 ENCOUNTER — Inpatient Hospital Stay (HOSPITAL_COMMUNITY)
Admission: EM | Admit: 2014-09-13 | Discharge: 2014-10-05 | DRG: 003 | Disposition: A | Discharge status: Other Acute Inpatient Hospital | Payer: Medicare Other | Attending: Cardiothoracic Surgery | Admitting: Cardiothoracic Surgery

## 2014-09-13 ENCOUNTER — Emergency Department (HOSPITAL_COMMUNITY): Payer: Medicare Other

## 2014-09-13 ENCOUNTER — Inpatient Hospital Stay (HOSPITAL_COMMUNITY): Payer: Medicare Other

## 2014-09-13 ENCOUNTER — Other Ambulatory Visit (HOSPITAL_COMMUNITY): Payer: Medicare Other

## 2014-09-13 ENCOUNTER — Encounter (HOSPITAL_COMMUNITY)
Admission: EM | Disposition: A | Discharge status: Nursing Home (Includes ICF) | Payer: Self-pay | Source: Home / Self Care | Attending: Cardiothoracic Surgery

## 2014-09-13 DIAGNOSIS — G9341 Metabolic encephalopathy: Secondary | ICD-10-CM | POA: Diagnosis not present

## 2014-09-13 DIAGNOSIS — Z951 Presence of aortocoronary bypass graft: Secondary | ICD-10-CM

## 2014-09-13 DIAGNOSIS — F419 Anxiety disorder, unspecified: Secondary | ICD-10-CM | POA: Diagnosis present

## 2014-09-13 DIAGNOSIS — K8010 Calculus of gallbladder with chronic cholecystitis without obstruction: Secondary | ICD-10-CM | POA: Diagnosis present

## 2014-09-13 DIAGNOSIS — I9589 Other hypotension: Secondary | ICD-10-CM | POA: Insufficient documentation

## 2014-09-13 DIAGNOSIS — Z880 Allergy status to penicillin: Secondary | ICD-10-CM

## 2014-09-13 DIAGNOSIS — Z978 Presence of other specified devices: Secondary | ICD-10-CM | POA: Insufficient documentation

## 2014-09-13 DIAGNOSIS — J690 Pneumonitis due to inhalation of food and vomit: Secondary | ICD-10-CM | POA: Diagnosis not present

## 2014-09-13 DIAGNOSIS — E780 Pure hypercholesterolemia: Secondary | ICD-10-CM | POA: Diagnosis present

## 2014-09-13 DIAGNOSIS — R1110 Vomiting, unspecified: Secondary | ICD-10-CM

## 2014-09-13 DIAGNOSIS — K9420 Gastrostomy complication, unspecified: Secondary | ICD-10-CM

## 2014-09-13 DIAGNOSIS — I5023 Acute on chronic systolic (congestive) heart failure: Secondary | ICD-10-CM | POA: Diagnosis present

## 2014-09-13 DIAGNOSIS — Z95811 Presence of heart assist device: Secondary | ICD-10-CM | POA: Insufficient documentation

## 2014-09-13 DIAGNOSIS — I5021 Acute systolic (congestive) heart failure: Secondary | ICD-10-CM | POA: Diagnosis not present

## 2014-09-13 DIAGNOSIS — I480 Paroxysmal atrial fibrillation: Secondary | ICD-10-CM | POA: Diagnosis not present

## 2014-09-13 DIAGNOSIS — J9601 Acute respiratory failure with hypoxia: Secondary | ICD-10-CM

## 2014-09-13 DIAGNOSIS — R739 Hyperglycemia, unspecified: Secondary | ICD-10-CM | POA: Diagnosis not present

## 2014-09-13 DIAGNOSIS — K567 Ileus, unspecified: Secondary | ICD-10-CM | POA: Diagnosis not present

## 2014-09-13 DIAGNOSIS — E876 Hypokalemia: Secondary | ICD-10-CM | POA: Diagnosis not present

## 2014-09-13 DIAGNOSIS — I2101 ST elevation (STEMI) myocardial infarction involving left main coronary artery: Secondary | ICD-10-CM | POA: Diagnosis not present

## 2014-09-13 DIAGNOSIS — J810 Acute pulmonary edema: Secondary | ICD-10-CM | POA: Diagnosis not present

## 2014-09-13 DIAGNOSIS — E871 Hypo-osmolality and hyponatremia: Secondary | ICD-10-CM | POA: Diagnosis not present

## 2014-09-13 DIAGNOSIS — Z8709 Personal history of other diseases of the respiratory system: Secondary | ICD-10-CM

## 2014-09-13 DIAGNOSIS — R945 Abnormal results of liver function studies: Secondary | ICD-10-CM

## 2014-09-13 DIAGNOSIS — Z9911 Dependence on respirator [ventilator] status: Secondary | ICD-10-CM | POA: Diagnosis not present

## 2014-09-13 DIAGNOSIS — F17210 Nicotine dependence, cigarettes, uncomplicated: Secondary | ICD-10-CM | POA: Diagnosis present

## 2014-09-13 DIAGNOSIS — Z87898 Personal history of other specified conditions: Secondary | ICD-10-CM | POA: Diagnosis not present

## 2014-09-13 DIAGNOSIS — J939 Pneumothorax, unspecified: Secondary | ICD-10-CM

## 2014-09-13 DIAGNOSIS — K9423 Gastrostomy malfunction: Secondary | ICD-10-CM

## 2014-09-13 DIAGNOSIS — Z930 Tracheostomy status: Secondary | ICD-10-CM | POA: Insufficient documentation

## 2014-09-13 DIAGNOSIS — I469 Cardiac arrest, cause unspecified: Secondary | ICD-10-CM | POA: Diagnosis not present

## 2014-09-13 DIAGNOSIS — J449 Chronic obstructive pulmonary disease, unspecified: Secondary | ICD-10-CM | POA: Diagnosis present

## 2014-09-13 DIAGNOSIS — E872 Acidosis: Secondary | ICD-10-CM | POA: Diagnosis not present

## 2014-09-13 DIAGNOSIS — I313 Pericardial effusion (noninflammatory): Secondary | ICD-10-CM | POA: Diagnosis not present

## 2014-09-13 DIAGNOSIS — R52 Pain, unspecified: Secondary | ICD-10-CM

## 2014-09-13 DIAGNOSIS — I361 Nonrheumatic tricuspid (valve) insufficiency: Secondary | ICD-10-CM | POA: Diagnosis not present

## 2014-09-13 DIAGNOSIS — K8020 Calculus of gallbladder without cholecystitis without obstruction: Secondary | ICD-10-CM | POA: Insufficient documentation

## 2014-09-13 DIAGNOSIS — J90 Pleural effusion, not elsewhere classified: Secondary | ICD-10-CM | POA: Diagnosis not present

## 2014-09-13 DIAGNOSIS — I10 Essential (primary) hypertension: Secondary | ICD-10-CM | POA: Diagnosis present

## 2014-09-13 DIAGNOSIS — Z8249 Family history of ischemic heart disease and other diseases of the circulatory system: Secondary | ICD-10-CM | POA: Diagnosis not present

## 2014-09-13 DIAGNOSIS — R570 Cardiogenic shock: Secondary | ICD-10-CM | POA: Diagnosis present

## 2014-09-13 DIAGNOSIS — I2510 Atherosclerotic heart disease of native coronary artery without angina pectoris: Secondary | ICD-10-CM | POA: Diagnosis present

## 2014-09-13 DIAGNOSIS — D696 Thrombocytopenia, unspecified: Secondary | ICD-10-CM | POA: Diagnosis not present

## 2014-09-13 DIAGNOSIS — D62 Acute posthemorrhagic anemia: Secondary | ICD-10-CM | POA: Diagnosis not present

## 2014-09-13 DIAGNOSIS — Z789 Other specified health status: Secondary | ICD-10-CM | POA: Diagnosis not present

## 2014-09-13 DIAGNOSIS — I2102 ST elevation (STEMI) myocardial infarction involving left anterior descending coronary artery: Secondary | ICD-10-CM

## 2014-09-13 DIAGNOSIS — N179 Acute kidney failure, unspecified: Secondary | ICD-10-CM | POA: Diagnosis not present

## 2014-09-13 DIAGNOSIS — J811 Chronic pulmonary edema: Secondary | ICD-10-CM

## 2014-09-13 DIAGNOSIS — R0602 Shortness of breath: Secondary | ICD-10-CM

## 2014-09-13 DIAGNOSIS — E785 Hyperlipidemia, unspecified: Secondary | ICD-10-CM | POA: Diagnosis present

## 2014-09-13 DIAGNOSIS — J95822 Acute and chronic postprocedural respiratory failure: Secondary | ICD-10-CM | POA: Diagnosis not present

## 2014-09-13 DIAGNOSIS — J9690 Respiratory failure, unspecified, unspecified whether with hypoxia or hypercapnia: Secondary | ICD-10-CM

## 2014-09-13 DIAGNOSIS — F329 Major depressive disorder, single episode, unspecified: Secondary | ICD-10-CM | POA: Diagnosis present

## 2014-09-13 DIAGNOSIS — Z9289 Personal history of other medical treatment: Secondary | ICD-10-CM | POA: Insufficient documentation

## 2014-09-13 DIAGNOSIS — Z419 Encounter for procedure for purposes other than remedying health state, unspecified: Secondary | ICD-10-CM

## 2014-09-13 DIAGNOSIS — I2109 ST elevation (STEMI) myocardial infarction involving other coronary artery of anterior wall: Principal | ICD-10-CM | POA: Diagnosis present

## 2014-09-13 DIAGNOSIS — I213 ST elevation (STEMI) myocardial infarction of unspecified site: Secondary | ICD-10-CM | POA: Diagnosis present

## 2014-09-13 DIAGNOSIS — Z4659 Encounter for fitting and adjustment of other gastrointestinal appliance and device: Secondary | ICD-10-CM

## 2014-09-13 DIAGNOSIS — R791 Abnormal coagulation profile: Secondary | ICD-10-CM | POA: Diagnosis present

## 2014-09-13 DIAGNOSIS — Z79899 Other long term (current) drug therapy: Secondary | ICD-10-CM | POA: Diagnosis not present

## 2014-09-13 DIAGNOSIS — R7989 Other specified abnormal findings of blood chemistry: Secondary | ICD-10-CM | POA: Insufficient documentation

## 2014-09-13 DIAGNOSIS — K9189 Other postprocedural complications and disorders of digestive system: Secondary | ICD-10-CM

## 2014-09-13 DIAGNOSIS — I509 Heart failure, unspecified: Secondary | ICD-10-CM

## 2014-09-13 DIAGNOSIS — I25110 Atherosclerotic heart disease of native coronary artery with unstable angina pectoris: Secondary | ICD-10-CM

## 2014-09-13 DIAGNOSIS — K219 Gastro-esophageal reflux disease without esophagitis: Secondary | ICD-10-CM | POA: Diagnosis present

## 2014-09-13 DIAGNOSIS — Z9689 Presence of other specified functional implants: Secondary | ICD-10-CM

## 2014-09-13 DIAGNOSIS — K831 Obstruction of bile duct: Secondary | ICD-10-CM | POA: Diagnosis not present

## 2014-09-13 DIAGNOSIS — R17 Unspecified jaundice: Secondary | ICD-10-CM | POA: Diagnosis not present

## 2014-09-13 DIAGNOSIS — J948 Other specified pleural conditions: Secondary | ICD-10-CM | POA: Diagnosis not present

## 2014-09-13 HISTORY — PX: PATENT FORAMEN OVALE CLOSURE: SHX5483

## 2014-09-13 HISTORY — PX: CORONARY ARTERY BYPASS GRAFT: SHX141

## 2014-09-13 HISTORY — PX: CARDIAC CATHETERIZATION: SHX172

## 2014-09-13 HISTORY — PX: CORONARY ANGIOGRAM: SHX5466

## 2014-09-13 LAB — CBC WITH DIFFERENTIAL/PLATELET
BASOS ABS: 0 10*3/uL (ref 0.0–0.1)
BASOS PCT: 0 % (ref 0–1)
Eosinophils Absolute: 0 10*3/uL (ref 0.0–0.7)
Eosinophils Relative: 0 % (ref 0–5)
HCT: 23.7 % — ABNORMAL LOW (ref 39.0–52.0)
Hemoglobin: 8.2 g/dL — ABNORMAL LOW (ref 13.0–17.0)
Lymphocytes Relative: 4 % — ABNORMAL LOW (ref 12–46)
Lymphs Abs: 0.3 10*3/uL — ABNORMAL LOW (ref 0.7–4.0)
MCH: 32.7 pg (ref 26.0–34.0)
MCHC: 34.6 g/dL (ref 30.0–36.0)
MCV: 94.4 fL (ref 78.0–100.0)
MONO ABS: 0.1 10*3/uL (ref 0.1–1.0)
Monocytes Relative: 1 % — ABNORMAL LOW (ref 3–12)
NEUTROS ABS: 7 10*3/uL (ref 1.7–7.7)
Neutrophils Relative %: 96 % — ABNORMAL HIGH (ref 43–77)
PLATELETS: 81 10*3/uL — AB (ref 150–400)
RBC: 2.51 MIL/uL — AB (ref 4.22–5.81)
RDW: 15.8 % — AB (ref 11.5–15.5)
WBC: 7.4 10*3/uL (ref 4.0–10.5)

## 2014-09-13 LAB — BASIC METABOLIC PANEL
Anion gap: 9 (ref 5–15)
BUN: 11 mg/dL (ref 6–23)
CALCIUM: 8.7 mg/dL (ref 8.4–10.5)
CO2: 25 mmol/L (ref 19–32)
CREATININE: 1.11 mg/dL (ref 0.50–1.35)
Chloride: 100 mmol/L (ref 96–112)
GFR calc Af Amer: 75 mL/min — ABNORMAL LOW (ref >90)
GFR calc non Af Amer: 65 mL/min — ABNORMAL LOW (ref >90)
Glucose, Bld: 130 mg/dL — ABNORMAL HIGH (ref 70–99)
Potassium: 4.1 mmol/L (ref 3.5–5.1)
Sodium: 134 mmol/L — ABNORMAL LOW (ref 135–145)

## 2014-09-13 LAB — POCT I-STAT 7, (LYTES, BLD GAS, ICA,H+H)
Acid-base deficit: 3 mmol/L — ABNORMAL HIGH (ref 0.0–2.0)
Bicarbonate: 23.6 mEq/L (ref 20.0–24.0)
Calcium, Ion: 1.27 mmol/L (ref 1.13–1.30)
HCT: 26 % — ABNORMAL LOW (ref 39.0–52.0)
HEMOGLOBIN: 8.8 g/dL — AB (ref 13.0–17.0)
O2 Saturation: 100 %
PH ART: 7.334 — AB (ref 7.350–7.450)
Potassium: 3.6 mmol/L (ref 3.5–5.1)
SODIUM: 136 mmol/L (ref 135–145)
TCO2: 25 mmol/L (ref 0–100)
pCO2 arterial: 43.4 mmHg (ref 35.0–45.0)
pO2, Arterial: 289 mmHg — ABNORMAL HIGH (ref 80.0–100.0)

## 2014-09-13 LAB — POCT I-STAT, CHEM 8
BUN: 12 mg/dL (ref 6–23)
BUN: 10 mg/dL (ref 6–23)
BUN: 9 mg/dL (ref 6–23)
BUN: 10 mg/dL (ref 6–23)
BUN: 9 mg/dL (ref 6–23)
BUN: 9 mg/dL (ref 6–23)
BUN: 9 mg/dL (ref 6–23)
BUN: 8 mg/dL (ref 6–23)
BUN: 10 mg/dL (ref 6–23)
CALCIUM ION: 1.11 mmol/L — AB (ref 1.13–1.30)
CALCIUM ION: 1.2 mmol/L (ref 1.13–1.30)
CHLORIDE: 100 mmol/L (ref 96–112)
CHLORIDE: 94 mmol/L — AB (ref 96–112)
CHLORIDE: 97 mmol/L (ref 96–112)
CHLORIDE: 99 mmol/L (ref 96–112)
CHLORIDE: 101 mmol/L (ref 96–112)
CREATININE: 0.7 mg/dL (ref 0.50–1.35)
CREATININE: 0.7 mg/dL (ref 0.50–1.35)
Calcium, Ion: 1.04 mmol/L — ABNORMAL LOW (ref 1.13–1.30)
Calcium, Ion: 1.23 mmol/L (ref 1.13–1.30)
Calcium, Ion: 1.02 mmol/L — ABNORMAL LOW (ref 1.13–1.30)
Calcium, Ion: 1.33 mmol/L — ABNORMAL HIGH (ref 1.13–1.30)
Calcium, Ion: 1.03 mmol/L — ABNORMAL LOW (ref 1.13–1.30)
Calcium, Ion: 1.22 mmol/L (ref 1.13–1.30)
Calcium, Ion: 1.38 mmol/L — ABNORMAL HIGH (ref 1.13–1.30)
Chloride: 101 mmol/L (ref 96–112)
Chloride: 100 mmol/L (ref 96–112)
Chloride: 101 mmol/L (ref 96–112)
Chloride: 101 mmol/L (ref 96–112)
Creatinine, Ser: 0.8 mg/dL (ref 0.50–1.35)
Creatinine, Ser: 0.6 mg/dL (ref 0.50–1.35)
Creatinine, Ser: 0.8 mg/dL (ref 0.50–1.35)
Creatinine, Ser: 0.7 mg/dL (ref 0.50–1.35)
Creatinine, Ser: 0.6 mg/dL (ref 0.50–1.35)
Creatinine, Ser: 0.7 mg/dL (ref 0.50–1.35)
Creatinine, Ser: 1 mg/dL (ref 0.50–1.35)
GLUCOSE: 186 mg/dL — AB (ref 70–99)
GLUCOSE: 210 mg/dL — AB (ref 70–99)
Glucose, Bld: 197 mg/dL — ABNORMAL HIGH (ref 70–99)
Glucose, Bld: 229 mg/dL — ABNORMAL HIGH (ref 70–99)
Glucose, Bld: 213 mg/dL — ABNORMAL HIGH (ref 70–99)
Glucose, Bld: 216 mg/dL — ABNORMAL HIGH (ref 70–99)
Glucose, Bld: 243 mg/dL — ABNORMAL HIGH (ref 70–99)
Glucose, Bld: 265 mg/dL — ABNORMAL HIGH (ref 70–99)
Glucose, Bld: 178 mg/dL — ABNORMAL HIGH (ref 70–99)
HCT: 38 % — ABNORMAL LOW (ref 39.0–52.0)
HCT: 22 % — ABNORMAL LOW (ref 39.0–52.0)
HCT: 33 % — ABNORMAL LOW (ref 39.0–52.0)
HCT: 15 % — ABNORMAL LOW (ref 39.0–52.0)
HEMATOCRIT: 17 % — AB (ref 39.0–52.0)
HEMATOCRIT: 28 % — AB (ref 39.0–52.0)
HEMATOCRIT: 23 % — AB (ref 39.0–52.0)
HEMATOCRIT: 32 % — AB (ref 39.0–52.0)
HEMATOCRIT: 25 % — AB (ref 39.0–52.0)
HEMOGLOBIN: 5.8 g/dL — AB (ref 13.0–17.0)
HEMOGLOBIN: 9.5 g/dL — AB (ref 13.0–17.0)
HEMOGLOBIN: 8.5 g/dL — AB (ref 13.0–17.0)
HEMOGLOBIN: 5.1 g/dL — AB (ref 13.0–17.0)
Hemoglobin: 12.9 g/dL — ABNORMAL LOW (ref 13.0–17.0)
Hemoglobin: 7.5 g/dL — ABNORMAL LOW (ref 13.0–17.0)
Hemoglobin: 7.8 g/dL — ABNORMAL LOW (ref 13.0–17.0)
Hemoglobin: 10.9 g/dL — ABNORMAL LOW (ref 13.0–17.0)
Hemoglobin: 11.2 g/dL — ABNORMAL LOW (ref 13.0–17.0)
POTASSIUM: 3.6 mmol/L (ref 3.5–5.1)
POTASSIUM: 5 mmol/L (ref 3.5–5.1)
POTASSIUM: 3.5 mmol/L (ref 3.5–5.1)
Potassium: 3.8 mmol/L (ref 3.5–5.1)
Potassium: 3.6 mmol/L (ref 3.5–5.1)
Potassium: 5 mmol/L (ref 3.5–5.1)
Potassium: 3.8 mmol/L (ref 3.5–5.1)
Potassium: 3.2 mmol/L — ABNORMAL LOW (ref 3.5–5.1)
Potassium: 3.1 mmol/L — ABNORMAL LOW (ref 3.5–5.1)
SODIUM: 137 mmol/L (ref 135–145)
SODIUM: 135 mmol/L (ref 135–145)
SODIUM: 139 mmol/L (ref 135–145)
SODIUM: 139 mmol/L (ref 135–145)
Sodium: 138 mmol/L (ref 135–145)
Sodium: 131 mmol/L — ABNORMAL LOW (ref 135–145)
Sodium: 131 mmol/L — ABNORMAL LOW (ref 135–145)
Sodium: 138 mmol/L (ref 135–145)
Sodium: 140 mmol/L (ref 135–145)
TCO2: 24 mmol/L (ref 0–100)
TCO2: 19 mmol/L (ref 0–100)
TCO2: 21 mmol/L (ref 0–100)
TCO2: 23 mmol/L (ref 0–100)
TCO2: 18 mmol/L (ref 0–100)
TCO2: 19 mmol/L (ref 0–100)
TCO2: 21 mmol/L (ref 0–100)
TCO2: 21 mmol/L (ref 0–100)
TCO2: 19 mmol/L (ref 0–100)

## 2014-09-13 LAB — POCT I-STAT 3, ART BLOOD GAS (G3+)
ACID-BASE DEFICIT: 6 mmol/L — AB (ref 0.0–2.0)
ACID-BASE DEFICIT: 2 mmol/L (ref 0.0–2.0)
ACID-BASE DEFICIT: 2 mmol/L (ref 0.0–2.0)
Acid-base deficit: 5 mmol/L — ABNORMAL HIGH (ref 0.0–2.0)
Acid-base deficit: 10 mmol/L — ABNORMAL HIGH (ref 0.0–2.0)
BICARBONATE: 21.4 meq/L (ref 20.0–24.0)
BICARBONATE: 23.9 meq/L (ref 20.0–24.0)
Bicarbonate: 20.5 mEq/L (ref 20.0–24.0)
Bicarbonate: 15.9 mEq/L — ABNORMAL LOW (ref 20.0–24.0)
Bicarbonate: 23.2 mEq/L (ref 20.0–24.0)
O2 SAT: 100 %
O2 SAT: 100 %
O2 Saturation: 90 %
O2 Saturation: 69 %
O2 Saturation: 100 %
PCO2 ART: 39.6 mmHg (ref 35.0–45.0)
PCO2 ART: 43.6 mmHg (ref 35.0–45.0)
PH ART: 7.323 — AB (ref 7.350–7.450)
PH ART: 7.336 — AB (ref 7.350–7.450)
PO2 ART: 63 mmHg — AB (ref 80.0–100.0)
PO2 ART: 41 mmHg — AB (ref 80.0–100.0)
PO2 ART: 397 mmHg — AB (ref 80.0–100.0)
PO2 ART: 297 mmHg — AB (ref 80.0–100.0)
Patient temperature: 34.8
TCO2: 22 mmol/L (ref 0–100)
TCO2: 23 mmol/L (ref 0–100)
TCO2: 17 mmol/L (ref 0–100)
TCO2: 24 mmol/L (ref 0–100)
TCO2: 25 mmol/L (ref 0–100)
pCO2 arterial: 46.6 mmHg — ABNORMAL HIGH (ref 35.0–45.0)
pCO2 arterial: 36.1 mmHg (ref 35.0–45.0)
pCO2 arterial: 41.2 mmHg (ref 35.0–45.0)
pH, Arterial: 7.271 — ABNORMAL LOW (ref 7.350–7.450)
pH, Arterial: 7.251 — ABNORMAL LOW (ref 7.350–7.450)
pH, Arterial: 7.358 (ref 7.350–7.450)
pO2, Arterial: 435 mmHg — ABNORMAL HIGH (ref 80.0–100.0)

## 2014-09-13 LAB — PROTIME-INR
INR: 1.05 (ref 0.00–1.49)
INR: 2.48 — ABNORMAL HIGH (ref 0.00–1.49)
PROTHROMBIN TIME: 13.8 s (ref 11.6–15.2)
Prothrombin Time: 27 seconds — ABNORMAL HIGH (ref 11.6–15.2)

## 2014-09-13 LAB — COMPREHENSIVE METABOLIC PANEL
ALT: 32 U/L (ref 0–53)
ANION GAP: 7 (ref 5–15)
AST: 257 U/L — ABNORMAL HIGH (ref 0–37)
Albumin: 2.1 g/dL — ABNORMAL LOW (ref 3.5–5.2)
Alkaline Phosphatase: 23 U/L — ABNORMAL LOW (ref 39–117)
BILIRUBIN TOTAL: 1.5 mg/dL — AB (ref 0.3–1.2)
BUN: 9 mg/dL (ref 6–23)
CO2: 23 mmol/L (ref 19–32)
Calcium: 7.5 mg/dL — ABNORMAL LOW (ref 8.4–10.5)
Chloride: 108 mmol/L (ref 96–112)
Creatinine, Ser: 0.9 mg/dL (ref 0.50–1.35)
GFR calc Af Amer: >90 mL/min (ref >90)
GFR calc non Af Amer: 84 mL/min — ABNORMAL LOW (ref >90)
Glucose, Bld: 265 mg/dL — ABNORMAL HIGH (ref 70–99)
Potassium: 3.1 mmol/L — ABNORMAL LOW (ref 3.5–5.1)
Sodium: 138 mmol/L (ref 135–145)
Total Protein: 3.3 g/dL — ABNORMAL LOW (ref 6.0–8.3)

## 2014-09-13 LAB — DIFFERENTIAL
BASOS ABS: 0 10*3/uL (ref 0.0–0.1)
Basophils Relative: 0 % (ref 0–1)
EOS PCT: 0 % (ref 0–5)
Eosinophils Absolute: 0 10*3/uL (ref 0.0–0.7)
Lymphocytes Relative: 6 % — ABNORMAL LOW (ref 12–46)
Lymphs Abs: 0.7 10*3/uL (ref 0.7–4.0)
MONO ABS: 0.5 10*3/uL (ref 0.1–1.0)
Monocytes Relative: 5 % (ref 3–12)
NEUTROS ABS: 9.5 10*3/uL — AB (ref 1.7–7.7)
Neutrophils Relative %: 88 % — ABNORMAL HIGH (ref 43–77)

## 2014-09-13 LAB — PREPARE RBC (CROSSMATCH)

## 2014-09-13 LAB — GLUCOSE, CAPILLARY
GLUCOSE-CAPILLARY: 175 mg/dL — AB (ref 70–99)
GLUCOSE-CAPILLARY: 161 mg/dL — AB (ref 70–99)
Glucose-Capillary: 249 mg/dL — ABNORMAL HIGH (ref 70–99)
Glucose-Capillary: 254 mg/dL — ABNORMAL HIGH (ref 70–99)
Glucose-Capillary: 234 mg/dL — ABNORMAL HIGH (ref 70–99)

## 2014-09-13 LAB — TSH: TSH: 0.704 u[IU]/mL (ref 0.350–4.500)

## 2014-09-13 LAB — CBC
HCT: 17.7 % — ABNORMAL LOW (ref 39.0–52.0)
HEMATOCRIT: 42 % (ref 39.0–52.0)
Hemoglobin: 14.7 g/dL (ref 13.0–17.0)
Hemoglobin: 6.2 g/dL — CL (ref 13.0–17.0)
MCH: 35.5 pg — ABNORMAL HIGH (ref 26.0–34.0)
MCH: 30.8 pg (ref 26.0–34.0)
MCHC: 35 g/dL (ref 30.0–36.0)
MCHC: 35 g/dL (ref 30.0–36.0)
MCV: 101.4 fL — AB (ref 78.0–100.0)
MCV: 88.1 fL (ref 78.0–100.0)
Platelets: 178 10*3/uL (ref 150–400)
Platelets: 106 10*3/uL — ABNORMAL LOW (ref 150–400)
RBC: 4.14 MIL/uL — ABNORMAL LOW (ref 4.22–5.81)
RBC: 2.01 MIL/uL — ABNORMAL LOW (ref 4.22–5.81)
RDW: 12.7 % (ref 11.5–15.5)
RDW: 16.4 % — ABNORMAL HIGH (ref 11.5–15.5)
WBC: 10.8 10*3/uL — ABNORMAL HIGH (ref 4.0–10.5)
WBC: 8.4 10*3/uL (ref 4.0–10.5)

## 2014-09-13 LAB — HEMOGLOBIN AND HEMATOCRIT, BLOOD
HCT: 20.7 % — ABNORMAL LOW (ref 39.0–52.0)
Hemoglobin: 7.4 g/dL — ABNORMAL LOW (ref 13.0–17.0)

## 2014-09-13 LAB — ABO/RH: ABO/RH(D): O POS

## 2014-09-13 LAB — CARBOXYHEMOGLOBIN
Carboxyhemoglobin: 1.7 % — ABNORMAL HIGH (ref 0.5–1.5)
Methemoglobin: 1.4 % (ref 0.0–1.5)
O2 Saturation: 72.5 %
Total hemoglobin: 6.1 g/dL — CL (ref 13.5–18.0)

## 2014-09-13 LAB — TROPONIN I

## 2014-09-13 LAB — CREATININE, SERUM
Creatinine, Ser: 1.04 mg/dL (ref 0.50–1.35)
GFR calc Af Amer: 81 mL/min — ABNORMAL LOW (ref >90)
GFR calc non Af Amer: 70 mL/min — ABNORMAL LOW (ref >90)

## 2014-09-13 LAB — APTT
aPTT: 34 seconds (ref 24–37)
aPTT: 72 seconds — ABNORMAL HIGH (ref 24–37)
aPTT: 83 seconds — ABNORMAL HIGH (ref 24–37)

## 2014-09-13 LAB — BRAIN NATRIURETIC PEPTIDE: B Natriuretic Peptide: 96.8 pg/mL (ref 0.0–100.0)

## 2014-09-13 LAB — PLATELET COUNT: Platelets: 46 10*3/uL — ABNORMAL LOW (ref 150–400)

## 2014-09-13 LAB — T4, FREE: FREE T4: 0.78 ng/dL — AB (ref 0.80–1.80)

## 2014-09-13 LAB — FIBRINOGEN
FIBRINOGEN: 134 mg/dL — AB (ref 204–475)
Fibrinogen: 148 mg/dL — ABNORMAL LOW (ref 204–475)

## 2014-09-13 LAB — I-STAT TROPONIN, ED: Troponin i, poc: 3.48 ng/mL (ref 0.00–0.08)

## 2014-09-13 LAB — MAGNESIUM: Magnesium: 2.1 mg/dL (ref 1.5–2.5)

## 2014-09-13 LAB — MRSA PCR SCREENING: MRSA by PCR: NEGATIVE

## 2014-09-13 LAB — POCT ACTIVATED CLOTTING TIME: Activated Clotting Time: 472 seconds

## 2014-09-13 SURGERY — VENTRICULAR ASSIST DEVICE INSERTION

## 2014-09-13 SURGERY — CORONARY ARTERY BYPASS GRAFTING (CABG)
Anesthesia: General | Site: Chest

## 2014-09-13 MED ORDER — NOREPINEPHRINE BITARTRATE 1 MG/ML IV SOLN
0.0000 ug/min | INTRAVENOUS | Status: DC
  Administered 2014-09-13: 50 ug/min via INTRAVENOUS
  Administered 2014-09-13: 65 ug/min via INTRAVENOUS
  Administered 2014-09-14: 35 ug/min via INTRAVENOUS
  Administered 2014-09-15 (×2): 24 ug/min via INTRAVENOUS
  Filled 2014-09-13 (×6): qty 16

## 2014-09-13 MED ORDER — METOPROLOL TARTRATE 12.5 MG HALF TABLET
12.5000 mg | ORAL_TABLET | Freq: Two times a day (BID) | ORAL | Status: DC
  Filled 2014-09-13 (×7): qty 1

## 2014-09-13 MED ORDER — EPINEPHRINE HCL 1 MG/ML IJ SOLN
0.0000 ug/min | INTRAVENOUS | Status: DC
  Administered 2014-09-13: 15 ug/min via INTRAVENOUS
  Administered 2014-09-13 (×2): 20 ug/min via INTRAVENOUS
  Administered 2014-09-13: 15 ug/min via INTRAVENOUS
  Administered 2014-09-14 – 2014-09-15 (×11): 20 ug/min via INTRAVENOUS
  Administered 2014-09-16: 16 ug/min via INTRAVENOUS
  Administered 2014-09-16 (×2): 18 ug/min via INTRAVENOUS
  Administered 2014-09-16: 20 ug/min via INTRAVENOUS
  Administered 2014-09-16: 16 ug/min via INTRAVENOUS
  Administered 2014-09-16 (×2): 20 ug/min via INTRAVENOUS
  Administered 2014-09-17: 14 ug/min via INTRAVENOUS
  Administered 2014-09-17 (×3): 15 ug/min via INTRAVENOUS
  Administered 2014-09-17: 16 ug/min via INTRAVENOUS
  Administered 2014-09-18 (×2): 12 ug/min via INTRAVENOUS
  Administered 2014-09-18: 17 ug/min via INTRAVENOUS
  Administered 2014-09-18: 12 ug/min via INTRAVENOUS
  Administered 2014-09-19: 20 ug/min via INTRAVENOUS
  Administered 2014-09-19: 16 ug/min via INTRAVENOUS
  Administered 2014-09-20: 15 ug/min via INTRAVENOUS
  Administered 2014-09-20: 20 ug/min via INTRAVENOUS
  Administered 2014-09-21: 13 ug/min via INTRAVENOUS
  Administered 2014-09-21: 20 ug/min via INTRAVENOUS
  Administered 2014-09-22: 12 ug/min via INTRAVENOUS
  Administered 2014-09-22: 7.5 ug/min via INTRAVENOUS
  Administered 2014-09-22: 9.5 ug/min via INTRAVENOUS
  Administered 2014-09-23: 4 ug/min via INTRAVENOUS
  Administered 2014-09-24: 2.5 ug/min via INTRAVENOUS
  Administered 2014-09-25 – 2014-09-27 (×3): 2 ug/min via INTRAVENOUS
  Filled 2014-09-13 (×52): qty 4

## 2014-09-13 MED ORDER — ACETAMINOPHEN 500 MG PO TABS
1000.0000 mg | ORAL_TABLET | Freq: Four times a day (QID) | ORAL | Status: DC
  Filled 2014-09-13 (×8): qty 2

## 2014-09-13 MED ORDER — SODIUM CHLORIDE 0.9 % IJ SOLN
INTRAMUSCULAR | Status: DC | PRN
  Administered 2014-09-13 (×4): 4 mL via TOPICAL

## 2014-09-13 MED ORDER — ROCURONIUM BROMIDE 100 MG/10ML IV SOLN
INTRAVENOUS | Status: DC | PRN
  Administered 2014-09-13: 30 mg via INTRAVENOUS
  Administered 2014-09-13 (×2): 50 mg via INTRAVENOUS
  Administered 2014-09-13: 70 mg via INTRAVENOUS

## 2014-09-13 MED ORDER — DOPAMINE-DEXTROSE 3.2-5 MG/ML-% IV SOLN
INTRAVENOUS | Status: DC | PRN
  Administered 2014-09-13: 3 ug/kg/min via INTRAVENOUS

## 2014-09-13 MED ORDER — LIDOCAINE IN D5W 4-5 MG/ML-% IV SOLN: 1.0000 mg/min | INTRAVENOUS | Status: DC

## 2014-09-13 MED ORDER — ETOMIDATE 2 MG/ML IV SOLN
INTRAVENOUS | Status: DC | PRN
  Administered 2014-09-13: 10 mg via INTRAVENOUS

## 2014-09-13 MED ORDER — SODIUM CHLORIDE 0.9 % IJ SOLN: 3.0000 mL | INTRAMUSCULAR | Status: DC | PRN

## 2014-09-13 MED ORDER — NITROGLYCERIN IN D5W 200-5 MCG/ML-% IV SOLN
2.0000 ug/min | INTRAVENOUS | Status: DC
  Filled 2014-09-13: qty 250

## 2014-09-13 MED ORDER — LIDOCAINE IN D5W 4-5 MG/ML-% IV SOLN
1.0000 mg/min | INTRAVENOUS | Status: AC
  Administered 2014-09-13: 1 mg/min via INTRAVENOUS
  Filled 2014-09-13: qty 500

## 2014-09-13 MED ORDER — SODIUM CHLORIDE 0.9 % IV SOLN: Freq: Once | INTRAVENOUS | Status: DC

## 2014-09-13 MED ORDER — BISACODYL 5 MG PO TBEC
10.0000 mg | DELAYED_RELEASE_TABLET | Freq: Every day | ORAL | Status: DC
  Administered 2014-09-27: 10 mg via ORAL
  Filled 2014-09-13 (×2): qty 2

## 2014-09-13 MED ORDER — NITROGLYCERIN IN D5W 200-5 MCG/ML-% IV SOLN: 0.0000 ug/min | INTRAVENOUS | Status: DC

## 2014-09-13 MED ORDER — NOREPINEPHRINE BITARTRATE 1 MG/ML IV SOLN
INTRAVENOUS | Status: AC
  Filled 2014-09-13: qty 4

## 2014-09-13 MED ORDER — DOPAMINE-DEXTROSE 3.2-5 MG/ML-% IV SOLN
0.0000 ug/kg/min | INTRAVENOUS | Status: DC
  Filled 2014-09-13: qty 250

## 2014-09-13 MED ORDER — ALBUMIN HUMAN 5 % IV SOLN
12.5000 g | Freq: Three times a day (TID) | INTRAVENOUS | Status: AC | PRN
  Administered 2014-09-13 (×3): 12.5 g via INTRAVENOUS
  Filled 2014-09-13 (×2): qty 250

## 2014-09-13 MED ORDER — DOCUSATE SODIUM 100 MG PO CAPS: 200.0000 mg | ORAL_CAPSULE | Freq: Every day | ORAL | Status: DC

## 2014-09-13 MED ORDER — HEPARIN SODIUM (PORCINE) 5000 UNIT/ML IJ SOLN: 50000.0000 [IU] | INTRAVENOUS | Status: DC

## 2014-09-13 MED ORDER — HEPARIN SODIUM (PORCINE) 5000 UNIT/ML IJ SOLN
50000.0000 [IU] | INTRAVENOUS | Status: DC
  Filled 2014-09-13 (×2): qty 10

## 2014-09-13 MED ORDER — PHENYLEPHRINE HCL 10 MG/ML IJ SOLN
30.0000 ug/min | INTRAVENOUS | Status: DC
  Filled 2014-09-13: qty 2

## 2014-09-13 MED ORDER — HEPARIN SODIUM (PORCINE) 5000 UNIT/ML IJ SOLN
INTRAMUSCULAR | Status: AC
  Administered 2014-09-13: 5000 [IU]
  Filled 2014-09-13: qty 1

## 2014-09-13 MED ORDER — SODIUM CHLORIDE 0.9 % IV SOLN
INTRAVENOUS | Status: DC
  Filled 2014-09-13: qty 2.5

## 2014-09-13 MED ORDER — BIVALIRUDIN 250 MG IV SOLR
INTRAVENOUS | Status: AC
  Filled 2014-09-13: qty 250

## 2014-09-13 MED ORDER — AMIODARONE LOAD VIA INFUSION
150.0000 mg | Freq: Once | INTRAVENOUS | Status: AC
  Administered 2014-09-13: 150 mg via INTRAVENOUS
  Filled 2014-09-13: qty 83.34

## 2014-09-13 MED ORDER — LIDOCAINE HCL (CARDIAC) 20 MG/ML IV SOLN
INTRAVENOUS | Status: DC | PRN
  Administered 2014-09-13: 100 mg via INTRAVENOUS

## 2014-09-13 MED ORDER — SODIUM CHLORIDE 0.45 % IV SOLN
INTRAVENOUS | Status: DC | PRN
Start: 2014-09-13 — End: 2014-10-01
  Administered 2014-09-14 – 2014-09-15 (×2): via INTRAVENOUS
  Administered 2014-09-17: 10 mL/h via INTRAVENOUS
  Administered 2014-09-18: 10 mL via INTRAVENOUS
  Administered 2014-09-20 – 2014-09-22 (×2): via INTRAVENOUS
  Administered 2014-09-24: 10 mL/h via INTRAVENOUS
  Administered 2014-09-26: 50 mL/h via INTRAVENOUS

## 2014-09-13 MED ORDER — ASPIRIN 81 MG PO CHEW
CHEWABLE_TABLET | ORAL | Status: AC
  Filled 2014-09-13: qty 4

## 2014-09-13 MED ORDER — TIOTROPIUM BROMIDE MONOHYDRATE 18 MCG IN CAPS
18.0000 ug | ORAL_CAPSULE | Freq: Every day | RESPIRATORY_TRACT | Status: DC
  Filled 2014-09-13: qty 5

## 2014-09-13 MED ORDER — MILRINONE IN DEXTROSE 20 MG/100ML IV SOLN
0.3000 ug/kg/min | INTRAVENOUS | Status: DC
  Administered 2014-09-13: 0.3 ug/kg/min via INTRAVENOUS
  Administered 2014-09-13 – 2014-09-16 (×3): 0.2 ug/kg/min via INTRAVENOUS
  Filled 2014-09-13 (×3): qty 100

## 2014-09-13 MED ORDER — MORPHINE SULFATE 2 MG/ML IJ SOLN
2.0000 mg | INTRAMUSCULAR | Status: DC | PRN
  Administered 2014-09-14 (×4): 2 mg via INTRAVENOUS
  Administered 2014-09-14: 4 mg via INTRAVENOUS
  Administered 2014-09-15: 2 mg via INTRAVENOUS
  Administered 2014-09-15 (×2): 4 mg via INTRAVENOUS
  Administered 2014-09-15: 2 mg via INTRAVENOUS
  Administered 2014-09-16 – 2014-09-17 (×3): 4 mg via INTRAVENOUS
  Administered 2014-09-17 – 2014-09-18 (×4): 2 mg via INTRAVENOUS
  Administered 2014-09-19: 4 mg via INTRAVENOUS
  Administered 2014-09-19 (×2): 2 mg via INTRAVENOUS
  Administered 2014-09-19 – 2014-09-20 (×4): 4 mg via INTRAVENOUS
  Administered 2014-09-20 (×2): 2 mg via INTRAVENOUS
  Administered 2014-09-20: 4 mg via INTRAVENOUS
  Filled 2014-09-13 (×2): qty 2
  Filled 2014-09-13: qty 1
  Filled 2014-09-13 (×4): qty 2
  Filled 2014-09-13 (×4): qty 1
  Filled 2014-09-13 (×3): qty 2
  Filled 2014-09-13: qty 1
  Filled 2014-09-13 (×5): qty 2
  Filled 2014-09-13: qty 1

## 2014-09-13 MED ORDER — MILRINONE IN DEXTROSE 20 MG/100ML IV SOLN
0.3750 ug/kg/min | INTRAVENOUS | Status: AC
  Administered 2014-09-13: .3 ug/kg/min via INTRAVENOUS
  Filled 2014-09-13: qty 100

## 2014-09-13 MED ORDER — ONDANSETRON HCL 4 MG/2ML IJ SOLN
4.0000 mg | Freq: Four times a day (QID) | INTRAMUSCULAR | Status: DC | PRN
  Administered 2014-09-20 – 2014-10-03 (×4): 4 mg via INTRAVENOUS
  Filled 2014-09-13 (×4): qty 2

## 2014-09-13 MED ORDER — SUCCINYLCHOLINE CHLORIDE 20 MG/ML IJ SOLN
INTRAMUSCULAR | Status: DC | PRN
  Administered 2014-09-13: 100 mg via INTRAVENOUS

## 2014-09-13 MED ORDER — VANCOMYCIN HCL IN DEXTROSE 1-5 GM/200ML-% IV SOLN
1000.0000 mg | Freq: Two times a day (BID) | INTRAVENOUS | Status: AC
  Administered 2014-09-13 – 2014-09-14 (×3): 1000 mg via INTRAVENOUS
  Filled 2014-09-13 (×3): qty 200

## 2014-09-13 MED ORDER — MIDAZOLAM HCL 2 MG/2ML IJ SOLN
INTRAMUSCULAR | Status: DC | PRN
  Administered 2014-09-13: 2 mg via INTRAVENOUS
  Administered 2014-09-13: 3 mg via INTRAVENOUS
  Administered 2014-09-13: 5 mg via INTRAVENOUS

## 2014-09-13 MED ORDER — CETYLPYRIDINIUM CHLORIDE 0.05 % MT LIQD
7.0000 mL | Freq: Four times a day (QID) | OROMUCOSAL | Status: DC
  Administered 2014-09-14 – 2014-10-05 (×87): 7 mL via OROMUCOSAL

## 2014-09-13 MED ORDER — CHLORHEXIDINE GLUCONATE 0.12 % MT SOLN
15.0000 mL | Freq: Two times a day (BID) | OROMUCOSAL | Status: DC
  Administered 2014-09-13 – 2014-10-05 (×44): 15 mL via OROMUCOSAL
  Filled 2014-09-13 (×44): qty 15

## 2014-09-13 MED ORDER — ACETAMINOPHEN 160 MG/5ML PO SOLN: 650.0000 mg | Freq: Once | ORAL | Status: AC

## 2014-09-13 MED ORDER — FENTANYL CITRATE 0.05 MG/ML IJ SOLN
INTRAMUSCULAR | Status: DC | PRN
  Administered 2014-09-13: 500 ug via INTRAVENOUS

## 2014-09-13 MED ORDER — LEVALBUTEROL HCL 1.25 MG/0.5ML IN NEBU
1.2500 mg | INHALATION_SOLUTION | Freq: Four times a day (QID) | RESPIRATORY_TRACT | Status: DC
  Administered 2014-09-13 (×2): 1.25 mg via RESPIRATORY_TRACT
  Filled 2014-09-13 (×3): qty 0.5

## 2014-09-13 MED ORDER — TRAMADOL HCL 50 MG PO TABS
50.0000 mg | ORAL_TABLET | ORAL | Status: DC | PRN
Start: 2014-09-13 — End: 2014-10-05
  Administered 2014-10-05 (×2): 50 mg via ORAL
  Filled 2014-09-13 (×2): qty 1

## 2014-09-13 MED ORDER — ESCITALOPRAM OXALATE 5 MG PO TABS
15.0000 mg | ORAL_TABLET | Freq: Every day | ORAL | Status: DC
  Administered 2014-09-14: 15 mg via ORAL
  Filled 2014-09-13: qty 1

## 2014-09-13 MED ORDER — SODIUM CHLORIDE 0.9 % IJ SOLN
3.0000 mL | INTRAMUSCULAR | Status: DC | PRN
Start: 2014-09-13 — End: 2014-09-14

## 2014-09-13 MED ORDER — LEVOFLOXACIN IN D5W 750 MG/150ML IV SOLN
750.0000 mg | INTRAVENOUS | Status: AC
  Administered 2014-09-14 – 2014-09-15 (×2): 750 mg via INTRAVENOUS
  Filled 2014-09-13 (×2): qty 150

## 2014-09-13 MED ORDER — MIDAZOLAM HCL 2 MG/2ML IJ SOLN
INTRAMUSCULAR | Status: AC
  Filled 2014-09-13: qty 2

## 2014-09-13 MED ORDER — MIDAZOLAM HCL 2 MG/2ML IJ SOLN
2.0000 mg | INTRAMUSCULAR | Status: DC | PRN
  Administered 2014-09-14 – 2014-09-18 (×43): 2 mg via INTRAVENOUS
  Administered 2014-09-20: 4 mg via INTRAVENOUS
  Administered 2014-09-25: 2 mg via INTRAVENOUS
  Filled 2014-09-13 (×44): qty 2

## 2014-09-13 MED ORDER — LACTATED RINGERS IV SOLN
INTRAVENOUS | Status: DC
  Administered 2014-09-16: 12:00:00 via INTRAVENOUS

## 2014-09-13 MED ORDER — CHLORHEXIDINE GLUCONATE CLOTH 2 % EX PADS: 6.0000 | MEDICATED_PAD | Freq: Once | CUTANEOUS | Status: DC

## 2014-09-13 MED ORDER — HEMOSTATIC AGENTS (NO CHARGE) OPTIME
TOPICAL | Status: DC | PRN
  Administered 2014-09-13 (×3): 1 via TOPICAL

## 2014-09-13 MED ORDER — ALBUMIN HUMAN 5 % IV SOLN
INTRAVENOUS | Status: DC | PRN
  Administered 2014-09-13 (×2): via INTRAVENOUS

## 2014-09-13 MED ORDER — SODIUM CHLORIDE 0.9 % IV SOLN
INTRAVENOUS | Status: DC
  Administered 2014-09-13 (×2): 7.6 [IU]/h via INTRAVENOUS
  Administered 2014-09-14: 20:00:00 via INTRAVENOUS
  Administered 2014-09-14: 28.6 [IU]/h via INTRAVENOUS
  Administered 2014-09-15: 19:00:00 via INTRAVENOUS
  Administered 2014-09-16: 2.8 [IU]/h via INTRAVENOUS
  Filled 2014-09-13 (×5): qty 2.5

## 2014-09-13 MED ORDER — SODIUM CHLORIDE 0.9 % IV SOLN
INTRAVENOUS | Status: DC
  Administered 2014-09-18: 12:00:00 via INTRAVENOUS

## 2014-09-13 MED ORDER — ALBUMIN HUMAN 5 % IV SOLN
INTRAVENOUS | Status: AC
  Filled 2014-09-13: qty 750

## 2014-09-13 MED ORDER — FENTANYL CITRATE 0.05 MG/ML IJ SOLN
INTRAMUSCULAR | Status: AC
  Filled 2014-09-13: qty 5

## 2014-09-13 MED ORDER — PLASMA-LYTE 148 IV SOLN
INTRAVENOUS | Status: DC | PRN
  Administered 2014-09-13: 500 mL via INTRAVASCULAR

## 2014-09-13 MED ORDER — HEPARIN SODIUM (PORCINE) 1000 UNIT/ML IJ SOLN
INTRAMUSCULAR | Status: DC | PRN
  Administered 2014-09-13: 1000 [IU] via INTRAVENOUS
  Administered 2014-09-13: 5000 [IU] via INTRAVENOUS

## 2014-09-13 MED ORDER — ALBUMIN HUMAN 5 % IV SOLN
12.5000 g | Freq: Once | INTRAVENOUS | Status: AC
  Administered 2014-09-13: 12.5 g via INTRAVENOUS

## 2014-09-13 MED ORDER — ASPIRIN 81 MG PO CHEW
324.0000 mg | CHEWABLE_TABLET | Freq: Every day | ORAL | Status: DC
  Administered 2014-09-14 – 2014-10-05 (×20): 324 mg
  Filled 2014-09-13 (×20): qty 4

## 2014-09-13 MED ORDER — METOPROLOL TARTRATE 1 MG/ML IV SOLN: 2.5000 mg | INTRAVENOUS | Status: DC | PRN

## 2014-09-13 MED ORDER — MAGNESIUM SULFATE 50 % IJ SOLN
40.0000 meq | INTRAMUSCULAR | Status: DC
  Filled 2014-09-13: qty 10

## 2014-09-13 MED ORDER — LACTATED RINGERS IV SOLN
INTRAVENOUS | Status: DC
  Administered 2014-09-14: 20 mL/h via INTRAVENOUS
  Administered 2014-09-22: 23:00:00 via INTRAVENOUS

## 2014-09-13 MED ORDER — FAMOTIDINE IN NACL 20-0.9 MG/50ML-% IV SOLN
20.0000 mg | Freq: Two times a day (BID) | INTRAVENOUS | Status: AC
  Administered 2014-09-13 (×2): 20 mg via INTRAVENOUS
  Filled 2014-09-13: qty 50

## 2014-09-13 MED ORDER — TEMAZEPAM 15 MG PO CAPS: 15.0000 mg | ORAL_CAPSULE | Freq: Once | ORAL | Status: DC | PRN

## 2014-09-13 MED ORDER — AMIODARONE HCL IN DEXTROSE 360-4.14 MG/200ML-% IV SOLN
60.0000 mg/h | INTRAVENOUS | Status: AC
  Filled 2014-09-13: qty 200

## 2014-09-13 MED ORDER — VANCOMYCIN HCL 1000 MG IV SOLR
1000.0000 mg | INTRAVENOUS | Status: DC | PRN
  Administered 2014-09-13: 1250 mg via INTRAVENOUS

## 2014-09-13 MED ORDER — AMIODARONE HCL IN DEXTROSE 360-4.14 MG/200ML-% IV SOLN
30.0000 mg/h | INTRAVENOUS | Status: DC
  Administered 2014-09-13: 60 mg/h via INTRAVENOUS
  Administered 2014-09-13 – 2014-09-19 (×13): 30 mg/h via INTRAVENOUS
  Administered 2014-09-19 – 2014-09-22 (×11): 60 mg/h via INTRAVENOUS
  Administered 2014-09-22 – 2014-09-30 (×18): 30 mg/h via INTRAVENOUS
  Filled 2014-09-13 (×92): qty 200

## 2014-09-13 MED ORDER — PROTAMINE SULFATE 10 MG/ML IV SOLN
INTRAVENOUS | Status: AC
  Filled 2014-09-13: qty 25

## 2014-09-13 MED ORDER — PHENYLEPHRINE HCL 10 MG/ML IJ SOLN
20.0000 mg | INTRAVENOUS | Status: DC | PRN
  Administered 2014-09-13: 26 ug/min via INTRAVENOUS

## 2014-09-13 MED ORDER — ASPIRIN 81 MG PO CHEW
324.0000 mg | CHEWABLE_TABLET | Freq: Once | ORAL | Status: AC
  Administered 2014-09-13: 324 mg via ORAL

## 2014-09-13 MED ORDER — POTASSIUM CHLORIDE 10 MEQ/50ML IV SOLN
10.0000 meq | INTRAVENOUS | Status: AC
  Administered 2014-09-13 (×3): 10 meq via INTRAVENOUS

## 2014-09-13 MED ORDER — AMINOCAPROIC ACID 250 MG/ML IV SOLN
10.0000 g | INTRAVENOUS | Status: DC | PRN
  Administered 2014-09-13: 5 g/h via INTRAVENOUS

## 2014-09-13 MED ORDER — LIDOCAINE HCL (PF) 1 % IJ SOLN
INTRAMUSCULAR | Status: AC
  Filled 2014-09-13: qty 30

## 2014-09-13 MED ORDER — ASPIRIN EC 81 MG PO TBEC: 81.0000 mg | DELAYED_RELEASE_TABLET | Freq: Every day | ORAL | Status: DC

## 2014-09-13 MED ORDER — POTASSIUM CHLORIDE 10 MEQ/50ML IV SOLN
10.0000 meq | INTRAVENOUS | Status: AC
  Administered 2014-09-13 (×3): 10 meq via INTRAVENOUS
  Filled 2014-09-13 (×2): qty 50

## 2014-09-13 MED ORDER — MIDAZOLAM HCL 10 MG/2ML IJ SOLN
INTRAMUSCULAR | Status: AC
  Filled 2014-09-13: qty 2

## 2014-09-13 MED ORDER — NOREPINEPHRINE BITARTRATE 1 MG/ML IV SOLN
0.0000 ug/min | INTRAVENOUS | Status: DC
  Administered 2014-09-13: 30 ug/min via INTRAVENOUS
  Filled 2014-09-13: qty 4

## 2014-09-13 MED ORDER — SIMVASTATIN 20 MG PO TABS
20.0000 mg | ORAL_TABLET | Freq: Every evening | ORAL | Status: DC
  Filled 2014-09-13 (×3): qty 1

## 2014-09-13 MED ORDER — HEPARIN (PORCINE) IN NACL 2-0.9 UNIT/ML-% IJ SOLN
INTRAMUSCULAR | Status: AC
  Filled 2014-09-13: qty 1000

## 2014-09-13 MED ORDER — DEXMEDETOMIDINE HCL IN NACL 400 MCG/100ML IV SOLN
0.4000 ug/kg/h | INTRAVENOUS | Status: DC
  Filled 2014-09-13: qty 100

## 2014-09-13 MED ORDER — NITROGLYCERIN 0.4 MG SL SUBL: 0.4000 mg | SUBLINGUAL_TABLET | SUBLINGUAL | Status: DC | PRN

## 2014-09-13 MED ORDER — ACETAMINOPHEN 160 MG/5ML PO SOLN
1000.0000 mg | Freq: Four times a day (QID) | ORAL | Status: DC
  Administered 2014-09-14 – 2014-09-15 (×5): 1000 mg
  Filled 2014-09-13 (×5): qty 40.6

## 2014-09-13 MED ORDER — HEPARIN (PORCINE) IN NACL 100-0.45 UNIT/ML-% IJ SOLN
200.0000 [IU]/h | INTRAMUSCULAR | Status: DC
  Filled 2014-09-13: qty 250

## 2014-09-13 MED ORDER — FUROSEMIDE 10 MG/ML IJ SOLN
INTRAMUSCULAR | Status: AC
  Filled 2014-09-13: qty 4

## 2014-09-13 MED ORDER — EPINEPHRINE HCL 0.1 MG/ML IJ SOSY
PREFILLED_SYRINGE | INTRAMUSCULAR | Status: DC | PRN
  Administered 2014-09-13: .01 mg via INTRAVENOUS
  Administered 2014-09-13: .05 mg via INTRAVENOUS
  Administered 2014-09-13: .001 mg via INTRAVENOUS

## 2014-09-13 MED ORDER — INSULIN REGULAR HUMAN 100 UNIT/ML IJ SOLN
250.0000 [IU] | INTRAMUSCULAR | Status: DC | PRN
  Administered 2014-09-13: 3.8 [IU]/h via INTRAVENOUS

## 2014-09-13 MED ORDER — OXYCODONE HCL 5 MG PO TABS: 5.0000 mg | ORAL_TABLET | ORAL | Status: DC | PRN

## 2014-09-13 MED ORDER — LEVOFLOXACIN IN D5W 750 MG/150ML IV SOLN: 750.0000 mg | INTRAVENOUS | Status: DC

## 2014-09-13 MED ORDER — SODIUM CHLORIDE 0.9 % IJ SOLN
3.0000 mL | Freq: Two times a day (BID) | INTRAMUSCULAR | Status: DC
  Administered 2014-09-13 – 2014-09-14 (×2): 3 mL via INTRAVENOUS

## 2014-09-13 MED ORDER — PANTOPRAZOLE SODIUM 40 MG PO TBEC: 40.0000 mg | DELAYED_RELEASE_TABLET | Freq: Every day | ORAL | Status: DC

## 2014-09-13 MED ORDER — BISACODYL 10 MG RE SUPP
10.0000 mg | Freq: Every day | RECTAL | Status: DC
  Administered 2014-09-20 – 2014-10-01 (×10): 10 mg via RECTAL
  Filled 2014-09-13 (×10): qty 1

## 2014-09-13 MED ORDER — INSULIN REGULAR BOLUS VIA INFUSION
0.0000 [IU] | Freq: Three times a day (TID) | INTRAVENOUS | Status: DC
  Filled 2014-09-13: qty 10

## 2014-09-13 MED ORDER — LEVOFLOXACIN IN D5W 500 MG/100ML IV SOLN
INTRAVENOUS | Status: DC | PRN
  Administered 2014-09-13: 500 mg via INTRAVENOUS

## 2014-09-13 MED ORDER — SODIUM CHLORIDE 0.9 % IV SOLN
INTRAVENOUS | Status: DC
  Filled 2014-09-13: qty 40

## 2014-09-13 MED ORDER — SODIUM CHLORIDE 0.9 % IV SOLN
250.0000 mL | INTRAVENOUS | Status: DC
  Administered 2014-09-14 – 2014-09-21 (×2): 250 mL via INTRAVENOUS

## 2014-09-13 MED ORDER — DEXMEDETOMIDINE HCL IN NACL 200 MCG/50ML IV SOLN
0.0000 ug/kg/h | INTRAVENOUS | Status: DC
  Administered 2014-09-13: 0.7 ug/kg/h via INTRAVENOUS
  Administered 2014-09-13: 0.5 ug/kg/h via INTRAVENOUS
  Administered 2014-09-14 (×2): 0.7 ug/kg/h via INTRAVENOUS
  Filled 2014-09-13 (×3): qty 50

## 2014-09-13 MED ORDER — ALBUMIN HUMAN 5 % IV SOLN
250.0000 mL | INTRAVENOUS | Status: AC | PRN
  Administered 2014-09-13 (×4): 250 mL via INTRAVENOUS
  Filled 2014-09-13 (×2): qty 250

## 2014-09-13 MED ORDER — SODIUM CHLORIDE 0.9 % IV SOLN
INTRAVENOUS | Status: DC
  Filled 2014-09-13: qty 30

## 2014-09-13 MED ORDER — ACETAMINOPHEN 325 MG PO TABS: 650.0000 mg | ORAL_TABLET | ORAL | Status: DC | PRN

## 2014-09-13 MED ORDER — ACETAMINOPHEN 650 MG RE SUPP
650.0000 mg | Freq: Once | RECTAL | Status: AC
  Administered 2014-09-13: 650 mg via RECTAL

## 2014-09-13 MED ORDER — CALCIUM CHLORIDE 10 % IV SOLN
INTRAVENOUS | Status: DC | PRN
  Administered 2014-09-13: .2 g via INTRAVENOUS
  Administered 2014-09-13: 1 g via INTRAVENOUS

## 2014-09-13 MED ORDER — MORPHINE SULFATE 2 MG/ML IJ SOLN
1.0000 mg | INTRAMUSCULAR | Status: AC | PRN
  Filled 2014-09-13: qty 1

## 2014-09-13 MED ORDER — PHENYLEPHRINE HCL 10 MG/ML IJ SOLN
0.0000 ug/min | INTRAVENOUS | Status: DC
  Administered 2014-09-13: 50 ug/min via INTRAVENOUS
  Filled 2014-09-13: qty 4

## 2014-09-13 MED ORDER — EPINEPHRINE HCL 1 MG/ML IJ SOLN
4000.0000 ug | INTRAVENOUS | Status: DC | PRN
  Administered 2014-09-13: 5 ug/min via INTRAVENOUS

## 2014-09-13 MED ORDER — POTASSIUM CHLORIDE 10 MEQ/50ML IV SOLN
10.0000 meq | INTRAVENOUS | Status: AC
  Administered 2014-09-13: 10 meq via INTRAVENOUS
  Filled 2014-09-13: qty 50

## 2014-09-13 MED ORDER — FENTANYL CITRATE 0.05 MG/ML IJ SOLN
INTRAMUSCULAR | Status: AC
  Filled 2014-09-13: qty 2

## 2014-09-13 MED ORDER — HEPARIN SODIUM (PORCINE) 5000 UNIT/ML IJ SOLN: 4000.0000 [IU] | Freq: Once | INTRAMUSCULAR | Status: DC

## 2014-09-13 MED ORDER — DOPAMINE-DEXTROSE 3.2-5 MG/ML-% IV SOLN
0.0000 ug/kg/min | INTRAVENOUS | Status: DC
  Administered 2014-09-13: 3 ug/kg/min via INTRAVENOUS
  Administered 2014-09-15 – 2014-10-01 (×9): 5 ug/kg/min via INTRAVENOUS
  Filled 2014-09-13 (×11): qty 250

## 2014-09-13 MED ORDER — ONDANSETRON HCL 4 MG/2ML IJ SOLN: 4.0000 mg | Freq: Four times a day (QID) | INTRAMUSCULAR | Status: DC | PRN

## 2014-09-13 MED ORDER — BISACODYL 5 MG PO TBEC
5.0000 mg | DELAYED_RELEASE_TABLET | Freq: Once | ORAL | Status: DC
  Filled 2014-09-13: qty 1

## 2014-09-13 MED ORDER — ASPIRIN EC 325 MG PO TBEC
325.0000 mg | DELAYED_RELEASE_TABLET | Freq: Every day | ORAL | Status: DC
  Administered 2014-09-17: 325 mg via ORAL
  Filled 2014-09-13 (×22): qty 1

## 2014-09-13 MED ORDER — SODIUM BICARBONATE 8.4 % IV SOLN
INTRAVENOUS | Status: AC
  Filled 2014-09-13: qty 50

## 2014-09-13 MED ORDER — POTASSIUM CHLORIDE 2 MEQ/ML IV SOLN
80.0000 meq | INTRAVENOUS | Status: DC
  Filled 2014-09-13: qty 40

## 2014-09-13 MED ORDER — SODIUM CHLORIDE 0.9 % IJ SOLN
3.0000 mL | Freq: Two times a day (BID) | INTRAMUSCULAR | Status: DC
  Administered 2014-09-14 – 2014-09-21 (×11): 3 mL via INTRAVENOUS
  Administered 2014-09-21: 10 mL via INTRAVENOUS
  Administered 2014-09-22: 3 mL via INTRAVENOUS

## 2014-09-13 MED ORDER — SODIUM CHLORIDE 0.9 % IV SOLN
Freq: Once | INTRAVENOUS | Status: AC
  Administered 2014-09-13: 20:00:00 via INTRAVENOUS

## 2014-09-13 MED ORDER — LACTATED RINGERS IV SOLN: 500.0000 mL | Freq: Once | INTRAVENOUS | Status: DC | PRN

## 2014-09-13 MED ORDER — PROTAMINE SULFATE 10 MG/ML IV SOLN
INTRAVENOUS | Status: DC | PRN
Start: 2014-09-13 — End: 2014-09-13
  Administered 2014-09-13 (×2): 50 mg via INTRAVENOUS
  Administered 2014-09-13: 30 mg via INTRAVENOUS
  Administered 2014-09-13: 50 mg via INTRAVENOUS
  Administered 2014-09-13: 10 mg via INTRAVENOUS

## 2014-09-13 MED ORDER — NITROGLYCERIN 1 MG/10 ML FOR IR/CATH LAB
INTRA_ARTERIAL | Status: AC
  Filled 2014-09-13: qty 10

## 2014-09-13 MED ORDER — MAGNESIUM SULFATE 4 GM/100ML IV SOLN
4.0000 g | Freq: Once | INTRAVENOUS | Status: AC
  Administered 2014-09-13: 4 g via INTRAVENOUS
  Filled 2014-09-13: qty 100

## 2014-09-13 MED ORDER — METOPROLOL TARTRATE 12.5 MG HALF TABLET: 12.5000 mg | ORAL_TABLET | Freq: Once | ORAL | Status: DC

## 2014-09-13 MED ORDER — SODIUM CHLORIDE 0.9 % IV SOLN
20.0000 ug | INTRAVENOUS | Status: AC
  Administered 2014-09-13: 20 ug via INTRAVENOUS
  Filled 2014-09-13: qty 5

## 2014-09-13 MED ORDER — 0.9 % SODIUM CHLORIDE (POUR BTL) OPTIME
TOPICAL | Status: DC | PRN
  Administered 2014-09-13: 6000 mL

## 2014-09-13 MED ORDER — METOPROLOL TARTRATE 25 MG/10 ML ORAL SUSPENSION
12.5000 mg | Freq: Two times a day (BID) | ORAL | Status: DC
  Filled 2014-09-13 (×7): qty 5

## 2014-09-13 MED ORDER — EPINEPHRINE HCL 1 MG/ML IJ SOLN
0.0000 ug/min | INTRAVENOUS | Status: DC
  Filled 2014-09-13: qty 4

## 2014-09-13 MED ORDER — AMIODARONE HCL IN DEXTROSE 360-4.14 MG/200ML-% IV SOLN
60.0000 mg/h | INTRAVENOUS | Status: DC
  Filled 2014-09-13: qty 200

## 2014-09-13 MED ORDER — DEXMEDETOMIDINE HCL IN NACL 200 MCG/50ML IV SOLN
INTRAVENOUS | Status: DC | PRN
  Administered 2014-09-13: 11:00:00 via INTRAVENOUS
  Administered 2014-09-13: 0.4 ug/kg/h via INTRAVENOUS

## 2014-09-13 MED ORDER — CALCIUM CHLORIDE 10 % IV SOLN
1.0000 g | Freq: Once | INTRAVENOUS | Status: AC
Start: 2014-09-13 — End: 2014-09-13
  Administered 2014-09-13: 1 g via INTRAVENOUS

## 2014-09-13 MED ORDER — VANCOMYCIN HCL IN DEXTROSE 1-5 GM/200ML-% IV SOLN
1000.0000 mg | Freq: Once | INTRAVENOUS | Status: DC
  Filled 2014-09-13: qty 200

## 2014-09-13 MED ORDER — PHENYLEPHRINE HCL 10 MG/ML IJ SOLN
0.0000 ug/min | INTRAVENOUS | Status: DC
  Administered 2014-09-13: 60 ug/min via INTRAVENOUS
  Filled 2014-09-13: qty 2

## 2014-09-13 MED ORDER — BUDESONIDE 0.25 MG/2ML IN SUSP
0.2500 mg | Freq: Two times a day (BID) | RESPIRATORY_TRACT | Status: DC
  Administered 2014-09-13 – 2014-09-14 (×2): 0.25 mg via RESPIRATORY_TRACT
  Filled 2014-09-13 (×4): qty 2

## 2014-09-13 MED ORDER — SODIUM CHLORIDE 0.9 % IV SOLN: 250.0000 mL | INTRAVENOUS | Status: DC | PRN

## 2014-09-13 MED ORDER — NOREPINEPHRINE BITARTRATE 1 MG/ML IV SOLN
4000.0000 ug | INTRAVENOUS | Status: DC | PRN
  Administered 2014-09-13: 3 ug/min via INTRAVENOUS

## 2014-09-13 MED ORDER — DEXMEDETOMIDINE HCL IN NACL 400 MCG/100ML IV SOLN
0.1000 ug/kg/h | INTRAVENOUS | Status: DC
  Filled 2014-09-13: qty 100

## 2014-09-13 MED ORDER — VANCOMYCIN HCL 10 G IV SOLR
1250.0000 mg | INTRAVENOUS | Status: DC
  Filled 2014-09-13: qty 1250

## 2014-09-13 MED ORDER — LACTATED RINGERS IV SOLN
INTRAVENOUS | Status: DC | PRN
  Administered 2014-09-13 (×5): via INTRAVENOUS

## 2014-09-13 MED ORDER — PLASMA-LYTE 148 IV SOLN
INTRAVENOUS | Status: DC
  Filled 2014-09-13: qty 2.5

## 2014-09-13 MED ORDER — LEVOFLOXACIN IN D5W 500 MG/100ML IV SOLN
500.0000 mg | INTRAVENOUS | Status: DC
  Filled 2014-09-13: qty 100

## 2014-09-13 MED ORDER — NOREPINEPHRINE BITARTRATE 1 MG/ML IV SOLN
0.0000 ug/min | INTRAVENOUS | Status: DC
  Filled 2014-09-13: qty 4

## 2014-09-13 MED ORDER — LIDOCAINE HCL (CARDIAC) 20 MG/ML IV SOLN
INTRAVENOUS | Status: AC
  Filled 2014-09-13: qty 5

## 2014-09-13 MED ORDER — PROTAMINE SULFATE 10 MG/ML IV SOLN
INTRAVENOUS | Status: AC
  Filled 2014-09-13: qty 5

## 2014-09-13 SURGICAL SUPPLY — 106 items
ADAPTER CARDIO PERF ANTE/RETRO (ADAPTER) ×4 IMPLANT
BAG DECANTER FOR FLEXI CONT (MISCELLANEOUS) ×4 IMPLANT
BANDAGE ELASTIC 4 VELCRO ST LF (GAUZE/BANDAGES/DRESSINGS) ×4 IMPLANT
BANDAGE ELASTIC 6 VELCRO ST LF (GAUZE/BANDAGES/DRESSINGS) ×4 IMPLANT
BASKET HEART  (ORDER IN 25'S) (MISCELLANEOUS) ×1
BASKET HEART (ORDER IN 25'S) (MISCELLANEOUS) ×1
BASKET HEART (ORDER IN 25S) (MISCELLANEOUS) ×2 IMPLANT
BLADE STERNUM SYSTEM 6 (BLADE) ×4 IMPLANT
BLADE SURG 12 STRL SS (BLADE) ×4 IMPLANT
BLADE SURG ROTATE 9660 (MISCELLANEOUS) ×4 IMPLANT
BNDG GAUZE ELAST 4 BULKY (GAUZE/BANDAGES/DRESSINGS) ×4 IMPLANT
CANISTER SUCTION 2500CC (MISCELLANEOUS) ×4 IMPLANT
CANN PRFSN 3/8XRT ANG TPR 14 (MISCELLANEOUS) ×2
CANNULA GUNDRY RCSP 15FR (MISCELLANEOUS) ×4 IMPLANT
CANNULA PRFSN 3/8XRT ANG TPR14 (MISCELLANEOUS) ×2 IMPLANT
CANNULA VEN MTL TIP RT (MISCELLANEOUS) ×2
CANNULA VRC MALB SNGL STG 28FR (MISCELLANEOUS) ×2 IMPLANT
CATH CPB KIT VANTRIGT (MISCELLANEOUS) ×4 IMPLANT
CATH HEART VENT LEFT (CATHETERS) ×4 IMPLANT
CATH ROBINSON RED A/P 18FR (CATHETERS) ×16 IMPLANT
CATH THORACIC 36FR RT ANG (CATHETERS) IMPLANT
CLIP TI WIDE RED SMALL 24 (CLIP) ×4 IMPLANT
CONN 1/2X1/2X1/2  BEN (MISCELLANEOUS) ×2
CONN 1/2X1/2X1/2 BEN (MISCELLANEOUS) ×2 IMPLANT
CONN 3/8X1/2 ST GISH (MISCELLANEOUS) ×8 IMPLANT
COVER SURGICAL LIGHT HANDLE (MISCELLANEOUS) ×4 IMPLANT
CRADLE DONUT ADULT HEAD (MISCELLANEOUS) ×4 IMPLANT
DRAIN CHANNEL 32F RND 10.7 FF (WOUND CARE) IMPLANT
DRAPE CARDIOVASCULAR INCISE (DRAPES) ×2
DRAPE SLUSH/WARMER DISC (DRAPES) ×4 IMPLANT
DRAPE SRG 135X102X78XABS (DRAPES) ×2 IMPLANT
DRSG AQUACEL AG ADV 3.5X14 (GAUZE/BANDAGES/DRESSINGS) ×4 IMPLANT
ELECT BLADE 4.0 EZ CLEAN MEGAD (MISCELLANEOUS) ×4
ELECT BLADE 6.5 EXT (BLADE) ×4 IMPLANT
ELECT CAUTERY BLADE 6.4 (BLADE) ×4 IMPLANT
ELECT REM PT RETURN 9FT ADLT (ELECTROSURGICAL) ×8
ELECTRODE BLDE 4.0 EZ CLN MEGD (MISCELLANEOUS) ×2 IMPLANT
ELECTRODE REM PT RTRN 9FT ADLT (ELECTROSURGICAL) ×4 IMPLANT
GAUZE SPONGE 4X4 12PLY STRL (GAUZE/BANDAGES/DRESSINGS) ×8 IMPLANT
GLOVE BIO SURGEON STRL SZ7.5 (GLOVE) ×12 IMPLANT
GOWN STRL REUS W/ TWL LRG LVL3 (GOWN DISPOSABLE) ×8 IMPLANT
GOWN STRL REUS W/TWL LRG LVL3 (GOWN DISPOSABLE) ×8
HEMOSTAT POWDER SURGIFOAM 1G (HEMOSTASIS) ×16 IMPLANT
HEMOSTAT SURGICEL 2X14 (HEMOSTASIS) ×4 IMPLANT
INSERT FOGARTY XLG (MISCELLANEOUS) IMPLANT
KIT BASIN OR (CUSTOM PROCEDURE TRAY) ×4 IMPLANT
KIT ROOM TURNOVER OR (KITS) ×4 IMPLANT
KIT SUCTION CATH 14FR (SUCTIONS) ×8 IMPLANT
KIT VASOVIEW W/TROCAR VH 2000 (KITS) ×4 IMPLANT
LEAD PACING MYOCARDI (MISCELLANEOUS) ×4 IMPLANT
LINE VENT (MISCELLANEOUS) ×4 IMPLANT
LOOP VESSEL SUPERMAXI WHITE (MISCELLANEOUS) ×4 IMPLANT
MARKER GRAFT CORONARY BYPASS (MISCELLANEOUS) ×12 IMPLANT
NS IRRIG 1000ML POUR BTL (IV SOLUTION) ×24 IMPLANT
PACK OPEN HEART (CUSTOM PROCEDURE TRAY) ×4 IMPLANT
PAD ARMBOARD 7.5X6 YLW CONV (MISCELLANEOUS) ×8 IMPLANT
PAD ELECT DEFIB RADIOL ZOLL (MISCELLANEOUS) ×4 IMPLANT
PENCIL BUTTON HOLSTER BLD 10FT (ELECTRODE) ×4 IMPLANT
PUNCH AORTIC ROT 4.0MM RCL 40 (MISCELLANEOUS) ×4 IMPLANT
PUNCH AORTIC ROTATE  4.5MM 8IN (MISCELLANEOUS) ×4 IMPLANT
PUNCH AORTIC ROTATE 4.0MM (MISCELLANEOUS) IMPLANT
PUNCH AORTIC ROTATE 4.5MM 8IN (MISCELLANEOUS) IMPLANT
PUNCH AORTIC ROTATE 5MM 8IN (MISCELLANEOUS) IMPLANT
SET CARDIOPLEGIA MPS 5001102 (MISCELLANEOUS) ×4 IMPLANT
SPONGE LAP 18X18 X RAY DECT (DISPOSABLE) ×8 IMPLANT
SPONGE LAP 4X18 X RAY DECT (DISPOSABLE) ×4 IMPLANT
SURGIFLO W/THROMBIN 8M KIT (HEMOSTASIS) ×8 IMPLANT
SUT BONE WAX W31G (SUTURE) ×4 IMPLANT
SUT ETHIBOND 2 0 SH (SUTURE) ×2
SUT ETHIBOND 2 0 SH 36X2 (SUTURE) ×2 IMPLANT
SUT MNCRL AB 4-0 PS2 18 (SUTURE) IMPLANT
SUT PROLENE 3 0 SH DA (SUTURE) ×4 IMPLANT
SUT PROLENE 3 0 SH1 36 (SUTURE) IMPLANT
SUT PROLENE 4 0 RB 1 (SUTURE) ×10
SUT PROLENE 4 0 SH DA (SUTURE) ×12 IMPLANT
SUT PROLENE 4-0 RB1 .5 CRCL 36 (SUTURE) ×10 IMPLANT
SUT PROLENE 5 0 C 1 36 (SUTURE) IMPLANT
SUT PROLENE 6 0 C 1 30 (SUTURE) ×16 IMPLANT
SUT PROLENE 6 0 CC (SUTURE) ×12 IMPLANT
SUT PROLENE 8 0 BV175 6 (SUTURE) IMPLANT
SUT PROLENE BLUE 7 0 (SUTURE) ×8 IMPLANT
SUT SILK  1 MH (SUTURE) ×2
SUT SILK 1 MH (SUTURE) ×2 IMPLANT
SUT SILK 1 TIES 10X30 (SUTURE) ×4 IMPLANT
SUT SILK 2 0 SH CR/8 (SUTURE) ×4 IMPLANT
SUT SILK 3 0 SH CR/8 (SUTURE) IMPLANT
SUT STEEL 6MS V (SUTURE) ×8 IMPLANT
SUT STEEL SZ 6 DBL 3X14 BALL (SUTURE) ×4 IMPLANT
SUT VIC AB 1 CTX 36 (SUTURE) ×6
SUT VIC AB 1 CTX36XBRD ANBCTR (SUTURE) ×6 IMPLANT
SUT VIC AB 2-0 CT1 27 (SUTURE)
SUT VIC AB 2-0 CT1 36 (SUTURE) ×4 IMPLANT
SUT VIC AB 2-0 CT1 TAPERPNT 27 (SUTURE) IMPLANT
SUT VIC AB 2-0 CTX 27 (SUTURE) IMPLANT
SUT VIC AB 3-0 X1 27 (SUTURE) ×4 IMPLANT
SUTURE E-PAK OPEN HEART (SUTURE) ×4 IMPLANT
SYSTEM SAHARA CHEST DRAIN ATS (WOUND CARE) ×4 IMPLANT
TAPE CLOTH SURG 4X10 WHT LF (GAUZE/BANDAGES/DRESSINGS) ×4 IMPLANT
TOWEL OR 17X24 6PK STRL BLUE (TOWEL DISPOSABLE) ×8 IMPLANT
TOWEL OR 17X26 10 PK STRL BLUE (TOWEL DISPOSABLE) ×8 IMPLANT
TRAY FOLEY IC TEMP SENS 16FR (CATHETERS) ×4 IMPLANT
TUBING INSUFFLATION (TUBING) ×4 IMPLANT
UNDERPAD 30X30 INCONTINENT (UNDERPADS AND DIAPERS) ×4 IMPLANT
VENT LEFT HEART 12002 (CATHETERS) ×8
VRC MALLEABLE SINGLE STG 28FR (MISCELLANEOUS) ×4
WATER STERILE IRR 1000ML POUR (IV SOLUTION) ×8 IMPLANT

## 2014-09-13 NOTE — Brief Op Note (Signed)
09/13/2014      [address]       [place] [id number]             [phone]     09/13/2014  10:27 AM  PATIENT:  [name]  74 y.o. male  PRE-OPERATIVE DIAGNOSIS:  coronary artery disease  POST-OPERATIVE DIAGNOSIS:  coronary artery disease  PROCEDURE:  Procedure(s): EMERGENCY CORONARY ARTERY BYPASS GRAFTING (CABG)X3 LIMA-LAD; SVG-OM; SVG-RAMUS PATENT FORAMEN OVALE CLOSURE EVH WITH LEFT GREATER SAPHENOUS VEIN  SURGEON:  Surgeon(s): [name], MD  PHYSICIAN ASSISTANT: Danahi Reddish PA-C  ANESTHESIA:   general  PATIENT CONDITION:  ICU - intubated and hemodynamically stable.  PRE-OPERATIVE WEIGHT: 61kg  EBL: SEE ANEST/PERFUSION RECORDS  COMPLICATIONS: NO KNOWN, IMPELLA PLACED IN CATH LAB

## 2014-09-13 NOTE — Progress Notes (Signed)
Advised Dr. Donata ClayVan Trigt that we are unable to doppler a pulse in right foot.  Sanguinous drainage from OGT - ordered irrigation of OGT with cold water until clear.  Advised we are at maximum on all pressors.  Ordered an albumin with calcium chloride X1.

## 2014-09-13 NOTE — ED Notes (Signed)
Pt ready for cath lab, awaiting backup cath lab team.

## 2014-09-13 NOTE — Progress Notes (Signed)
Order for sheath removal verified with RN at bedside. Rt Radial artery access site assessed: level 0. 6French Sheath removed and TR band applied with 10cc of air at 15:20. RN confirmed condition of site.

## 2014-09-13 NOTE — ED Notes (Signed)
Per EMS, pt complaining of epigastric pain since yesterday at 1200. Pt has taken goody powders and 2-2mg  clozapam and 2-0.5mg  clozapam without relief. Pt is on potassium and doesn't know why and states that he does not have a cardiologist. Pt not complaining of cp, back pain, shoulder pain. Pt has hx of gerd. Pt on 3L supplemental o2. Pt is not on o2 at home. ST depression noted on 12 lead.

## 2014-09-13 NOTE — Progress Notes (Signed)
Patient ID: Justin MattocksDennis W Caffey, male   DOB: 13-Jan-1941, 74 y.o.   MRN: [id number]   SICU Evening Rounds:   Hemodynamically labile on Milrinone 0.2, levophed 60 mcg, epi 20 mcg, dop 5 mcg, neo 50, Impella flow at 3.0 CI = 1.9 Co-ox 72.5 posotp  Asleep on vent   Urine output good  CT output 100 per hr now from MT. Postop coagulopathy  CBC    Component Value Date/Time   WBC 7.4 09/13/2014 1310   RBC 2.51* 09/13/2014 1310   HGB 8.5* 09/13/2014 1331   HCT 25.0* 09/13/2014 1331   PLT 81* 09/13/2014 1310   MCV 94.4 09/13/2014 1310   MCH 32.7 09/13/2014 1310   MCHC 34.6 09/13/2014 1310   RDW 15.8* 09/13/2014 1310   LYMPHSABS 0.3* 09/13/2014 1310   MONOABS 0.1 09/13/2014 1310   EOSABS 0.0 09/13/2014 1310   BASOSABS 0.0 09/13/2014 1310     BMET    Component Value Date/Time   NA 138 09/13/2014 1400   K 3.1* 09/13/2014 1400   CL 108 09/13/2014 1400   CO2 23 09/13/2014 1400   GLUCOSE 265* 09/13/2014 1400   BUN 9 09/13/2014 1400   CREATININE 0.90 09/13/2014 1400   CALCIUM 7.5* 09/13/2014 1400   GFRNONAA 84* 09/13/2014 1400   GFRAA >90 09/13/2014 1400     A/P:  He is critically in on multiple high dose inotropes and vasopressors. Continue current plans.

## 2014-09-13 NOTE — H&P (Signed)
[name] is an 74 y.o. male.   Primary Cardiologist: PMD: Chief Complaint: chest pain HPI: 74 y/o man with h/o HTN, HLD and tobacco use presents with epigastric pain that began about 11 pm ad has been continuous. This was associated with SOB and nausea. Symptoms initially were intermittent.  Family reveals that the patient is genereally inactive other than walking in teh yard. He has been getting tired easily and has smoked a lot for a long time. In the ED, ECG revealed SR with ST elevation in aVR, aVL, V1-2 with diffuse deep ST depressions inferior and anterolateral leads.  His pain is currently better, down to a 3/10.  Past Medical History  Diagnosis Date  . Dyspnea   . Palpitations   . Anxiety and depression   . GERD (gastroesophageal reflux disease)   . Anxiety   . Depression   . Hypertension   . Hypercholesteremia     Past Surgical History  Procedure Laterality Date  . Appendectomy    . Cataract extraction w/phaco  05/06/2012    Procedure: CATARACT EXTRACTION PHACO AND INTRAOCULAR LENS PLACEMENT (IOC);  Surgeon: Tonny Branch, MD;  Location: [facility];  Service: Ophthalmology;  Laterality: Right;  CDE:22.64  . Cataract extraction w/phaco  05/24/2012    Procedure: CATARACT EXTRACTION PHACO AND INTRAOCULAR LENS PLACEMENT (IOC);  Surgeon: Tonny Branch, MD;  Location: [facility];  Service: Ophthalmology;  Laterality: Left;  CDE: 27.08    Family History  Problem Relation Age of Onset  . Coronary artery disease     Social History:  reports that he has been smoking Cigarettes.  He has a 40 pack-year smoking history. He does not have any smokeless tobacco history on file. He reports that he does not drink alcohol. His drug history is not on file.  Allergies:  Allergies  Allergen Reactions  . Penicillins Nausea And Vomiting    Medications Prior to Admission  Medication Sig Dispense Refill  . ALPRAZolam (XANAX) 0.5 MG tablet Take 0.5 mg by mouth 3 (three) times daily as needed.  Anxiety.    Marland Kitchen escitalopram (LEXAPRO) 10 MG tablet Take 15 mg by mouth daily.    . pantoprazole (PROTONIX) 40 MG tablet Take 40 mg by mouth daily.    . potassium chloride SA (K-DUR,KLOR-CON) 20 MEQ tablet Take 20 mEq by mouth daily.    . propranolol ER (INDERAL LA) 120 MG 24 hr capsule Take 120 mg by mouth 2 (two) times daily.     . simvastatin (ZOCOR) 20 MG tablet Take 20 mg by mouth every evening.    . Tamsulosin HCl (FLOMAX) 0.4 MG CAPS Take 0.4 mg by mouth at bedtime.     Marland Kitchen tiotropium (SPIRIVA) 18 MCG inhalation capsule Place 18 mcg into inhaler and inhale daily.      Results for orders placed or performed during the hospital encounter of 09/13/14 (from the past 48 hour(s))  CBC     Status: Abnormal   Collection Time: 09/13/14  3:57 AM  Result Value Ref Range   WBC 10.8 (H) 4.0 - 10.5 K/uL   RBC 4.14 (L) 4.22 - 5.81 MIL/uL   Hemoglobin 14.7 13.0 - 17.0 g/dL   HCT 42.0 39.0 - 52.0 %   MCV 101.4 (H) 78.0 - 100.0 fL   MCH 35.5 (H) 26.0 - 34.0 pg   MCHC 35.0 30.0 - 36.0 g/dL   RDW 12.7 11.5 - 15.5 %   Platelets 178 150 - 400 K/uL  Differential  Status: Abnormal   Collection Time: 09/13/14  3:57 AM  Result Value Ref Range   Neutrophils Relative % 88 (H) 43 - 77 %   Neutro Abs 9.5 (H) 1.7 - 7.7 K/uL   Lymphocytes Relative 6 (L) 12 - 46 %   Lymphs Abs 0.7 0.7 - 4.0 K/uL   Monocytes Relative 5 3 - 12 %   Monocytes Absolute 0.5 0.1 - 1.0 K/uL   Eosinophils Relative 0 0 - 5 %   Eosinophils Absolute 0.0 0.0 - 0.7 K/uL   Basophils Relative 0 0 - 1 %   Basophils Absolute 0.0 0.0 - 0.1 K/uL  Protime-INR     Status: None   Collection Time: 09/13/14  3:57 AM  Result Value Ref Range   Prothrombin Time 13.8 11.6 - 15.2 seconds   INR 1.05 0.00 - 1.49  APTT     Status: None   Collection Time: 09/13/14  3:57 AM  Result Value Ref Range   aPTT 34 24 - 37 seconds  Basic metabolic panel     Status: Abnormal   Collection Time: 09/13/14  3:57 AM  Result Value Ref Range   Sodium 134 (L) 135  - 145 mmol/L   Potassium 4.1 3.5 - 5.1 mmol/L   Chloride 100 96 - 112 mmol/L   CO2 25 19 - 32 mmol/L   Glucose, Bld 130 (H) 70 - 99 mg/dL   BUN 11 6 - 23 mg/dL   Creatinine, Ser 1.11 0.50 - 1.35 mg/dL   Calcium 8.7 8.4 - 10.5 mg/dL   GFR calc non Af Amer 65 (L) >90 mL/min   GFR calc Af Amer 75 (L) >90 mL/min    Comment: (NOTE) The eGFR has been calculated using the CKD EPI equation. This calculation has not been validated in all clinical situations. eGFR's persistently <90 mL/min signify possible Chronic Kidney Disease.    Anion gap 9 5 - 15  I-stat troponin, ED (0, 3, 6 hours) - do not order at [facility]     Status: Abnormal   Collection Time: 09/13/14  3:58 AM  Result Value Ref Range   Troponin i, poc 3.48 (HH) 0.00 - 0.08 ng/mL   Comment NOTIFIED PHYSICIAN    Comment 3            Comment: Due to the release kinetics of cTnI, a negative result within the first hours of the onset of symptoms does not rule out myocardial infarction with certainty. If myocardial infarction is still suspected, repeat the test at appropriate intervals.   I-STAT 3, arterial blood gas (G3+)     Status: Abnormal   Collection Time: 09/13/14  5:05 AM  Result Value Ref Range   pH, Arterial 7.323 (L) 7.350 - 7.450   pCO2 arterial 39.6 35.0 - 45.0 mmHg   pO2, Arterial 63.0 (L) 80.0 - 100.0 mmHg   Bicarbonate 20.5 20.0 - 24.0 mEq/L   TCO2 22 0 - 100 mmol/L   O2 Saturation 90.0 %   Acid-base deficit 5.0 (H) 0.0 - 2.0 mmol/L   Sample type ARTERIAL   POCT Activated clotting time     Status: None   Collection Time: 09/13/14  5:19 AM  Result Value Ref Range   Activated Clotting Time 472 seconds  Type and screen     Status: None (Preliminary result)   Collection Time: 09/13/14  6:15 AM  Result Value Ref Range   ABO/RH(D) O POS    Antibody Screen NEG    Sample  Expiration 09/16/2014    Unit Number [id number]    Blood Component Type RED CELLS,LR    Unit division 00    Status of Unit ISSUED     Transfusion Status OK TO TRANSFUSE    Crossmatch Result Compatible    Unit Number [id number]    Blood Component Type RED CELLS,LR    Unit division 00    Status of Unit ISSUED    Transfusion Status OK TO TRANSFUSE    Crossmatch Result Compatible    Unit Number [id number]    Blood Component Type RED CELLS,LR    Unit division 00    Status of Unit ISSUED    Transfusion Status OK TO TRANSFUSE    Crossmatch Result Compatible    Unit Number [id number]    Blood Component Type RED CELLS,LR    Unit division 00    Status of Unit ISSUED    Transfusion Status OK TO TRANSFUSE    Crossmatch Result Compatible    Unit Number [id number]    Blood Component Type RED CELLS,LR    Unit division 00    Status of Unit ISSUED    Transfusion Status OK TO TRANSFUSE    Crossmatch Result Compatible    Unit Number [id number]    Blood Component Type RED CELLS,LR    Unit division 00    Status of Unit ISSUED    Transfusion Status OK TO TRANSFUSE    Crossmatch Result Compatible   ABO/Rh     Status: None   Collection Time: 09/13/14  6:15 AM  Result Value Ref Range   ABO/RH(D) O POS   Prepare RBC (crossmatch)     Status: None   Collection Time: 09/13/14  6:15 AM  Result Value Ref Range   Order Confirmation ORDER PROCESSED BY BLOOD BANK   I-STAT 3, arterial blood gas (G3+)     Status: Abnormal   Collection Time: 09/13/14  6:16 AM  Result Value Ref Range   pH, Arterial 7.271 (L) 7.350 - 7.450   pCO2 arterial 46.6 (H) 35.0 - 45.0 mmHg   pO2, Arterial 41.0 (L) 80.0 - 100.0 mmHg   Bicarbonate 21.4 20.0 - 24.0 mEq/L   TCO2 23 0 - 100 mmol/L   O2 Saturation 69.0 %   Acid-base deficit 6.0 (H) 0.0 - 2.0 mmol/L   Sample type ARTERIAL   Prepare Pheresed Platelets     Status: None (Preliminary result)   Collection Time: 09/13/14  9:49 AM  Result Value Ref Range   Unit Number [id number]    Blood Component Type PLTP LR1 PAS    Unit division 00    Status of Unit ISSUED    Transfusion Status OK  TO TRANSFUSE   Prepare platelet pheresis     Status: None (Preliminary result)   Collection Time: 09/13/14  9:50 AM  Result Value Ref Range   Unit Number [id number]    Blood Component Type PLTP LR1 PAS    Unit division 00    Status of Unit ISSUED    Transfusion Status OK TO TRANSFUSE   Prepare fresh frozen plasma     Status: None (Preliminary result)   Collection Time: 09/13/14  9:51 AM  Result Value Ref Range   Unit Number [id number]    Blood Component Type THAWED PLASMA    Unit division 00    Status of Unit ISSUED    Transfusion Status OK TO TRANSFUSE    Unit Number [id number]    Blood Component Type THAWED PLASMA  Unit division 00    Status of Unit ISSUED    Transfusion Status OK TO TRANSFUSE   Fibrinogen     Status: Abnormal   Collection Time: 09/13/14 10:06 AM  Result Value Ref Range   Fibrinogen 134 (L) 204 - 475 mg/dL  Hemoglobin and hematocrit, blood     Status: Abnormal   Collection Time: 09/13/14 10:06 AM  Result Value Ref Range   Hemoglobin 7.4 (L) 13.0 - 17.0 g/dL    Comment: RESULT CALLED TO, READ BACK BY AND VERIFIED WITH: M PELANCE,RN AT 1030 09/13/14 BY K BARR    HCT 20.7 (L) 39.0 - 52.0 %    Comment: RESULT CALLED TO, READ BACK BY AND VERIFIED WITH: Vaughan Sine AT 1030 09/13/14 BY K BARR   Platelet count     Status: Abnormal   Collection Time: 09/13/14 10:06 AM  Result Value Ref Range   Platelets 46 (L) 150 - 400 K/uL    Comment: RESULT CALLED TO, READ BACK BY AND VERIFIED WITH: M PELANCE,RN AT 1030 09/13/14 BY K BARR PLATELET COUNT CONFIRMED BY SMEAR SPECIMEN CHECKED FOR CLOTS   Prepare RBC (crossmatch)     Status: None   Collection Time: 09/13/14 10:14 AM  Result Value Ref Range   Order Confirmation ORDER PROCESSED BY BLOOD BANK    Dg Chest Portable 1 View  09/13/2014   CLINICAL DATA:  Chest pain and shortness of breath  EXAM: PORTABLE CHEST - 1 VIEW  COMPARISON:  None.  FINDINGS: Hyperinflation and emphysematous change. There is no  edema, consolidation, effusion, or pneumothorax. There is a 6 mm nodular density projecting over the right base. Normal heart size and aortic contours.  IMPRESSION: 1. COPD without acute superimposed disease. 2. Probable nipple shadow on the right. When clinically feasible, recommend two view chest with nipple markers.   Electronically Signed   By: Monte Fantasia M.D.   On: 09/13/2014 04:17    ROS:CP, SHOB, no bleeding problems, no upcoming elective surgery  OBJECTIVE:   Vitals:   Filed Vitals:   09/13/14 0415 09/13/14 0423 09/13/14 0430 09/13/14 0457  BP: 101/69 103/73 93/67   Pulse:  92 64 100  Temp:      TempSrc:      Resp:  21 28   Height:      Weight:      SpO2: 99% 97% 99%    I&O's:   Intake/Output Summary (Last 24 hours) at 09/13/14 1149 Last data filed at 09/13/14 1138  Gross per 24 hour  Intake   2771 ml  Output   3000 ml  Net   -229 ml   TELEMETRY: Reviewed telemetry pt in AFib, although he was initially in NSR:     PHYSICAL EXAM General: Well developed, well nourished, in no acute distress Head:   Normal cephalic and atramatic  Lungs:  Bilateral wheezing. Heart:  Irregularly irregular S1 S2  No JVD.  1+ right radial pulse; 2+ right femoral pulse Abdomen: abdomen soft and non-tender Msk:  Back normal,  Normal strength and tone for age. Extremities:  No edema.   Neuro: Alert and oriented. Psych:  Normal affect, responds appropriately  LABS: Basic Metabolic Panel:  Recent Labs  09/13/14 0357  NA 134*  K 4.1  CL 100  CO2 25  GLUCOSE 130*  BUN 11  CREATININE 1.11  CALCIUM 8.7   Liver Function Tests: No results for input(s): AST, ALT, ALKPHOS, BILITOT, PROT, ALBUMIN in the last 72 hours. No results  for input(s): LIPASE, AMYLASE in the last 72 hours. CBC:  Recent Labs  09/13/14 0357 09/13/14 1006  WBC 10.8*  --   NEUTROABS 9.5*  --   HGB 14.7 7.4*  HCT 42.0 20.7*  MCV 101.4*  --   PLT 178 46*   Cardiac Enzymes: No results for input(s):  CKTOTAL, CKMB, CKMBINDEX, TROPONINI in the last 72 hours. BNP: Invalid input(s): POCBNP D-Dimer: No results for input(s): DDIMER in the last 72 hours. Hemoglobin A1C: No results for input(s): HGBA1C in the last 72 hours. Fasting Lipid Panel: No results for input(s): CHOL, HDL, LDLCALC, TRIG, CHOLHDL, LDLDIRECT in the last 72 hours. Thyroid Function Tests: No results for input(s): TSH, T4TOTAL, T3FREE, THYROIDAB in the last 72 hours.  Invalid input(s): FREET3 Anemia Panel: No results for input(s): VITAMINB12, FOLATE, FERRITIN, TIBC, IRON, RETICCTPCT in the last 72 hours. Coag Panel:   Lab Results  Component Value Date   INR 1.05 09/13/2014       Assessment/Plan 1) acute anterolateral wall MI: ECG is markedly abnormal. There is ST elevation in aVR which is concerning for possible left main disease. Plan for emergency heart catheterization. If stent is required, he should be a good candidate for drug-eluting stent. 2) blood pressure is borderline. I'm also afraid that he may be having some signs of heart failure. He has had several hours of symptoms. He likely has underlying lung disease as well. Will use Levophed  as first pressor of choice.  3)  He'll need aggressive risk factor modification and tobacco cessation. Further plans based on the cath result.   Madina Galati S. 09/13/2014, 11:49 AM

## 2014-09-13 NOTE — Transfer of Care (Signed)
Immediate Anesthesia Transfer of Care Note  Patient: [name]  Procedure(s) Performed: Procedure(s): CORONARY ARTERY BYPASS GRAFTING (CABG), ON PUMP, TIMES THREE, USING LEFT INTERNAL MAMMARY ARTERY, LEFT GREATER SAPHENOUS VEIN HARVESTED ENDOSCOPICALLY (N/A) PATENT FORAMEN OVALE CLOSURE  Patient Location: SICU  Anesthesia Type:General  Level of Consciousness: sedated and unresponsive  Airway & Oxygen Therapy: Patient remains intubated per anesthesia plan and Patient placed on Ventilator (see vital sign flow sheet for setting)  Post-op Assessment: Report given to RN and Post -op Vital signs reviewed and stable  Post vital signs: Reviewed and stable  Last Vitals:  Filed Vitals:   09/13/14 1319  BP:   Pulse: 91  Temp:   Resp: 18    Complications: No apparent anesthesia complications

## 2014-09-13 NOTE — OR Nursing (Signed)
Second call to SICU at 1229.

## 2014-09-13 NOTE — ED Notes (Signed)
[facility] Rapid Response Nurse in room

## 2014-09-13 NOTE — Progress Notes (Addendum)
Spoke with Dr. Maren BeachVanTrigt pt not maintaining goal MAP.  MAP goal reduced to 60.  Levophed can be titrated to 10570mcg/min.  Milrinone can be reduced to 0.72mcg/kg/min.  If pt tolerates reduction in sedation, titrate.  CBC at 1900 and call for a hgb less than 8.2.  Obtain CVPs.  Pt can be administered up to a total of 7 albumins.  4 have been administered.

## 2014-09-13 NOTE — Progress Notes (Signed)
Day of Surgery Procedure(s) (LRB): LEFT HEART CATHETERIZATION WITH CORONARY ANGIOGRAM (N/A)   Emergency Cath lab Consult Patient examined and coronary angiogram is reviewed with his cardiologist Dr. Hedy JacobJV  74 year old Caucasian male smoker with acute epigastric pain last night persistent. He presented to the ED with ST elevation MI, hypotension, and initial troponin 3.4. Chest x-ray showed COPD without significant edema. 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 The patient underwent PCI via the right radial artery of the distal left main which opened flow to the ramus and circumflex. The patient required levo fed for blood pressure support until a transfemoral arterial Impala LVAD was placed into the LV. The patient has not been intubated but is on oxygen mask nonrebreather.  I have discussed emergency CABG with the patient which has been recommended by the patient's cardiologist. He demonstrates understanding of this procedure and the severity of his current condition.  The patient's past medical history is positive for smoking history, prior cataract surgery, and no major previous thoracic trauma or procedures.  The patient presented in atrial fibrillation but there is no record of previous cardiac arrhythmia in his problem list.  Subjective: The patient is short of breath on the Cath Lab table with an Impala LVAD via the right femoral artery and a sheath in the right radial artery.  The patient is responsive and moves all extremities.   Objective: Vital signs in last 24 hours: Temp:  [97.6 F (36.4 C)] 97.6 F (36.4 C) (04/13 0344) Pulse Rate:  [64-92] 64 (04/13 0430) Cardiac Rhythm:  [-] Atrial fibrillation (04/13 0425) Resp:  [19-28] 28 (04/13 0430) BP: (93-104)/(45-81) 93/67 mmHg (04/13 0430) SpO2:  [94 %-100 %] 99 % (04/13 0430) Weight:  [135 lb (61.236 kg)] 135 lb  (61.236 kg) (04/13 0358)  Hemodynamic parameters for last 24 hours:   patient has been hypotensive unstable  Intake/Output from previous day:   Intake/Output this shift:     General: Elderly Caucasian male on the Cath Lab table acutely ill HEENT: Normocephalic pupils equal , dentition adequate Neck: Supple without JVD, adenopathy, or bruit Chest coarse breath sounds but, symmetrical breath sounds,i, no tenderness             or deformity Cardiovascular: Irregular rhythm of atrial fibrillation, no murmur, no gallop, peripheral pulses not             palpable in all extremities Abdomen:  Soft, nontender, no palpable mass or organomegaly Extremities: Warm, mild  Clubbing without  cyanosis edema or tenderness,              no venous stasis changes of the legs Rectal/GU: Deferred Neuro: Grossly non--focal and symmetrical throughout Skin: Clean and dry without rash or ulceration   Lab Results:  Recent Labs  09/13/14 0357  WBC 10.8*  HGB 14.7  HCT 42.0  PLT 178   BMET:  Recent Labs  09/13/14 0357  NA 134*  K 4.1  CL 100  CO2 25  GLUCOSE 130*  BUN 11  CREATININE 1.11  CALCIUM 8.7    PT/INR:  Recent Labs  09/13/14 0357  LABPROT 13.8  INR 1.05   ABG No results found for: PHART, HCO3, TCO2, ACIDBASEDEF, O2SAT CBG (last 3)  No results for input(s): GLUCAP in the last 72 hours.  Assessment/Plan: S/P Procedure(s) (LRB): LEFT HEART CATHETERIZATION WITH CORONARY ANGIOGRAM (N/A) Plan emergency CABG with left IMA to LAD and vein  grafts to the ramus and circumflex.   LOS: 0 days    Kathlee Nations Trigt III 09/13/2014

## 2014-09-13 NOTE — ED Provider Notes (Addendum)
CSN: [id number]     Arrival date & time 09/13/14  8341 History  This chart was scribed for Derwood Kaplan, MD by Tanda Rockers, ED Scribe. This patient was seen in room B14C/B14C and the patient's care was started at 3:41 AM.    Chief Complaint  Patient presents with  . Abdominal Pain  . Code STEMI   The history is provided by the patient and a relative. No language interpreter was used.     HPI Comments: [name] is a 74 y.o. male brought in by ambulance, with hx GERD, HTN, HLD who presents to the Emergency Department complaining of epigastric abdominal pain that began around noon or 4 pm yesterday, worsening around 1 AM tonight. Denies chest pain, back pain or shortness of breath. No similar symptoms in the last couple of days. Family does note that pt has been having dizzy spells as of late. Pt denies melena, hematochezia, or any other symptoms.  Pt does smoke a pack of cigarettes per day.  Past Medical History  Diagnosis Date  . Dyspnea   . Palpitations   . Anxiety and depression   . GERD (gastroesophageal reflux disease)   . Anxiety   . Depression   . Hypertension   . Hypercholesteremia    Past Surgical History  Procedure Laterality Date  . Appendectomy    . Cataract extraction w/phaco  05/06/2012    Procedure: CATARACT EXTRACTION PHACO AND INTRAOCULAR LENS PLACEMENT (IOC);  Surgeon: Gemma Payor, MD;  Location: [facility];  Service: Ophthalmology;  Laterality: Right;  CDE:22.64  . Cataract extraction w/phaco  05/24/2012    Procedure: CATARACT EXTRACTION PHACO AND INTRAOCULAR LENS PLACEMENT (IOC);  Surgeon: Gemma Payor, MD;  Location: [facility];  Service: Ophthalmology;  Laterality: Left;  CDE: 27.08   Family History  Problem Relation Age of Onset  . Coronary artery disease     History  Substance Use Topics  . Smoking status: Current Every Day Smoker -- 1.00 packs/day for 40 years    Types: Cigarettes  . Smokeless tobacco: Not on file  . Alcohol Use: No    Review of  Systems  Respiratory: Negative for shortness of breath.   Cardiovascular: Negative for chest pain.  Gastrointestinal: Positive for abdominal pain (Epigastric pain. ).  Musculoskeletal: Negative for back pain.  Neurological: Negative for numbness.  All other systems reviewed and are negative.     Allergies  Penicillins  Home Medications   Prior to Admission medications   Medication Sig Start Date End Date Taking? Authorizing Provider  ALPRAZolam Prudy Feeler) 0.5 MG tablet Take 0.5 mg by mouth 3 (three) times daily as needed. Anxiety.    Historical Provider, MD  Aspirin-Salicylamide-Caffeine (BC HEADACHE POWDER PO) Take 1 packet by mouth daily as needed. headaches    Historical Provider, MD  Besifloxacin HCl (BESIVANCE) 0.6 % SUSP Apply 1 drop to eye 3 (three) times daily. Instill in the right eye    Historical Provider, MD  Difluprednate (DUREZOL) 0.05 % EMUL Apply 1 drop to eye 2 (two) times daily. Instill in the right eye    Historical Provider, MD  escitalopram (LEXAPRO) 10 MG tablet Take 15 mg by mouth daily.    Historical Provider, MD  fluticasone (FLONASE) 50 MCG/ACT nasal spray Place 2 sprays into the nose daily.    Historical Provider, MD  nepafenac (NEVANAC) 0.1 % ophthalmic suspension Place 1 drop into the right eye daily.    Historical Provider, MD  pantoprazole (PROTONIX) 40 MG tablet Take  40 mg by mouth daily.    Historical Provider, MD  potassium chloride SA (K-DUR,KLOR-CON) 20 MEQ tablet Take 20 mEq by mouth daily.    Historical Provider, MD  propranolol ER (INDERAL LA) 120 MG 24 hr capsule Take 120 mg by mouth 2 (two) times daily.     Historical Provider, MD  simvastatin (ZOCOR) 20 MG tablet Take 20 mg by mouth every evening.    Historical Provider, MD  Tamsulosin HCl (FLOMAX) 0.4 MG CAPS Take 0.4 mg by mouth at bedtime.     Historical Provider, MD  tiotropium (SPIRIVA) 18 MCG inhalation capsule Place 18 mcg into inhaler and inhale daily.    Historical Provider, MD   Triage  Vitals: BP 95/45 mmHg  Pulse 86  Temp(Src) 97.6 F (36.4 C) (Oral)  Resp 25  SpO2 100%   Physical Exam  Constitutional: He is oriented to person, place, and time. He appears well-developed and well-nourished. No distress.  HENT:  Head: Normocephalic and atraumatic.  Eyes: Conjunctivae and EOM are normal.  Neck: Neck supple. No JVD present. No tracheal deviation present.  Cardiovascular: Normal rate, regular rhythm, normal heart sounds and intact distal pulses.   Pulmonary/Chest: Effort normal and breath sounds normal. No respiratory distress.  Abdominal: Soft. He exhibits no mass. There is no tenderness.  Musculoskeletal: Normal range of motion. He exhibits no edema (No pitting edema in lower extremities. ).  Neurological: He is alert and oriented to person, place, and time.  2+ equal radial pulses.  1+ distal femoral pulses.    Skin: Skin is warm and dry.  Psychiatric: He has a normal mood and affect. His behavior is normal.  Nursing note and vitals reviewed.   ED Course  Procedures (including critical care time)  DIAGNOSTIC STUDIES: Oxygen Saturation is 100% on RA, normal by my interpretation.    COORDINATION OF CARE: 3:45 AM-Discussed treatment plan which includes immediate consult with cardiologist on call for code STEMI with pt at bedside and pt agreed to plan.   Labs Review Labs Reviewed  CBC  DIFFERENTIAL  PROTIME-INR  APTT  BASIC METABOLIC PANEL  I-STAT TROPOININ, ED    Imaging Review No results found.   EKG Interpretation   Date/Time:  Wednesday September 13 2014 03:40:28 EDT Ventricular Rate:  82 PR Interval:  174 QRS Duration: 111 QT Interval:  324 QTC Calculation: 378 R Axis:   -28 Text Interpretation:  Sinus rhythm ST elevation avR, avL, v1-v2 ST  depression inferior and lateral leads ** ** ACUTE MI / STEMI ** **  Confirmed by Rhunette Croft, MD, Janey Genta [phone]) on 09/13/2014 4:04:21 AM      ED ECG REPORT   Date: 09/13/2014  Rate: 82  Rhythm: normal  sinus rhythm  QRS Axis: normal  Intervals: normal  ST/T Wave abnormalities: ST elevations anteriorly, ST depressions inferiorly and ST depressions laterally  Conduction Disutrbances:none  Narrative Interpretation:   Old EKG Reviewed: changes noted  I have personally reviewed the EKG tracing and agree with the computerized printout as noted.  MDM   Final diagnoses:  ST elevation myocardial infarction (STEMI), unspecified artery    I personally performed the services described in this documentation, which was scribed in my presence. The recorded information has been reviewed and is accurate.  Pt comes in with cc of epigastric abd pain. Patient is a poor historian. Pain started around dinner time, but reports it was 12 pm or 4 pm.  No chest pain, back pain, jaw, shoulder pain. No nausea, no  diaphoresis, no dib. Pt has no palpable mass. He has equal and bounding femoral and radial pulse bilaterally.  STEMI concerns, and CATH Lab was activated immediately. Heparin and Aspirin to be started in the ER. Pt to be managed in the ER until on call cath tem arrives, as the cath team is in the middle of intervention currently.  CRITICAL CARE Performed by: Derwood KaplanNanavati, Braidyn Peace   Total critical care time: 35 minutes  Critical care time was exclusive of separately billable procedures and treating other patients.  Critical care was necessary to treat or prevent imminent or life-threatening deterioration.  Critical care was time spent personally by me on the following activities: development of treatment plan with patient and/or surrogate as well as nursing, discussions with consultants, evaluation of patient's response to treatment, examination of patient, obtaining history from patient or surrogate, ordering and performing treatments and interventions, ordering and review of laboratory studies, ordering and review of radiographic studies, pulse oximetry and re-evaluation of patient's  condition.     Derwood KaplanAnkit Chinita Schimpf, MD 09/14/14 [id number]    Derwood KaplanAnkit Marline Morace, MD 09/16/14 [phone]

## 2014-09-13 NOTE — Anesthesia Procedure Notes (Signed)
Procedure Name: Intubation Date/Time: 09/13/2014 6:35 AM Performed by: Molli HazardGORDON, Nazareth Norenberg M Pre-anesthesia Checklist: Patient identified, Emergency Drugs available, Suction available and Patient being monitored Patient Re-evaluated:Patient Re-evaluated prior to inductionOxygen Delivery Method: Ambu bag Preoxygenation: Pre-oxygenation with 100% oxygen Intubation Type: IV induction, Rapid sequence and Cricoid Pressure applied Ventilation: Mask ventilation without difficulty Laryngoscope Size: Miller and 2 Grade View: Grade I Tube type: Oral Tube size: 8.0 mm Number of attempts: 1 Placement Confirmation: ETT inserted through vocal cords under direct vision,  positive ETCO2 and breath sounds checked- equal and bilateral Secured at: 23 cm Tube secured with: Tape Dental Injury: Teeth and Oropharynx as per pre-operative assessment  Comments: Pt intubated in cath lab by Lynelle Smoke. Harlis Champoux, CRNA.

## 2014-09-13 NOTE — Progress Notes (Signed)
Utilization Review Completed.  

## 2014-09-13 NOTE — Anesthesia Postprocedure Evaluation (Signed)
  Anesthesia Post-op Note  Patient: Justin MattocksDennis W Knight  Procedure(s) Performed: Procedure(s): CORONARY ARTERY BYPASS GRAFTING (CABG), ON PUMP, TIMES THREE, USING LEFT INTERNAL MAMMARY ARTERY, LEFT GREATER SAPHENOUS VEIN HARVESTED ENDOSCOPICALLY (N/A) PATENT FORAMEN OVALE CLOSURE  Patient Location: SICU  Anesthesia Type:General  Level of Consciousness: sedated and unresponsive  Airway and Oxygen Therapy: Patient remains intubated per anesthesia plan and Patient placed on Ventilator (see vital sign flow sheet for setting)  Post-op Pain: none  Post-op Assessment: Post-op Vital signs reviewed, Patient's Cardiovascular Status Stable, Respiratory Function Stable, Patent Airway and No signs of Nausea or vomiting  Post-op Vital Signs: Reviewed and stable  Last Vitals:  Filed Vitals:   09/13/14 1319  BP:   Pulse: 91  Temp:   Resp: 18    Complications: No apparent anesthesia complications

## 2014-09-13 NOTE — Progress Notes (Signed)
CRITICAL VALUE ALERT  Critical value received:  1925  Date of notification:  09/13/14  Time of notification:  1925  Critical value read back: yes  Nurse who received alert: Lorita OfficerN. Chianna Spirito  MD notified (1st page):  Dr. Maren BeachVanTrigt  Time of first page:  1927  MD notified (2nd page):  Time of second page:  Responding MD:  n/a  Time MD responded:  [phone]

## 2014-09-13 NOTE — OR Nursing (Signed)
First call to SICU at 1123.

## 2014-09-13 NOTE — ED Notes (Signed)
Dr Rhunette CroftNanavati in room with pt

## 2014-09-13 NOTE — Progress Notes (Signed)
Confirmed parameters for pressors / verfied maximum levels with Dr. Maren BeachVanTrigt

## 2014-09-13 NOTE — CV Procedure (Signed)
PROCEDURE:  Left heart catheterization with selective coronary angiography, PCI Left Main, LAD, Impella ventricular assist device placement.  INDICATIONS:  Acute anterior/ anterolateral ST elevation MI, cardiogenic shock, acute respiratory failure  The risks, benefits, and details of the procedure were explained to the patient.  The patient verbalized understanding and wanted to proceed.  Emergency consent was obtained.  PROCEDURE TECHNIQUE:  After Xylocaine anesthesia a 73F slender sheath was placed in the right radial artery with a single anterior needle wall stick.   IV Heparin was given.  Right coronary angiography was done using a Judkins R4 guide catheter.  Left coronary angiography was done using a CLS 3.0 guide catheter. The intervention was performed. Left heart cath was done using a pigtail catheter. Right femoral access was obtained after Xylocaine anesthesia in a sterile fashion. An Impella ventricular assist device was placed.  The ventricular system device was secured as were the guide catheter and coronary wires. The patient was transported to the operating room. Before transport, he was intubated.   CONTRAST:  Total of 90 cc.  COMPLICATIONS:  None.    HEMODYNAMICS:  Aortic pressure was 80/50; LV pressure was 80/15; LVEDP 38.  There was no gradient between the left ventricle and aorta.    ANGIOGRAPHIC DATA:   The left main coronary artery is occluded in the mid to distal section. There is moderate disease in the mid vessel. Proximally, the vessel is large and patent.  The left anterior descending artery is seen only after opening the left main. The LAD is occluded proximally.  The left circumflex artery is a medium size vessel. There is a large first marginal which is patent. There is a large ramus vessel which is patent.  The second marginal is widely patent as well. The circumflex system looks small and this is likely due to his cardiogenic shock and generalized  vasoconstriction.  The right coronary artery is a large, dominant vessel. The proximal vessel has mild, diffuse disease. The posterior lateral artery is very large and widely patent. The posterior descending artery is large and patent. There are collaterals going from the PDA into the LAD. The distal LAD fills well with collaterals.  LEFT VENTRICULOGRAM:  Left ventricular angiogram was not done.  LVEDP was 38 mmHg.  PCI NARRATIVE: A CLS 3.0 guide catheter was used to engage the left main. IV Angiomax used for anticoagulation. ACT was used to check that the angina max therapeutic. A pro-water wire was initially advanced into the circumflex. This restored TIMI 3 flow into the circumflex system. It appeared that the LAD had proximal disease. We placed a BMW wire down the vessel that we thought was the LAD. A 2.5 x 12 balloon was inflated several times. There is improvement in flow in what turned out to be a ramus vessel. There is a residual 40% stenosis in the left main. However the orientation of this BMW wire did not seem like the LAD. Further angiography revealed that the proximal LAD was occluded. The pro-water wire was then removed from the circumflex and then wired into the LAD system.  A 2.0 x 12 balloon was advanced to the proximal LAD and inflated. Despite several inflations, there is no significant flow restored to the LAD system. It was unclear whether our wire was in a septal perforator or if just the LAD was very underfilled. Given his multivessel disease and cardiogenic shock, it seemed that emergent bypass surgery would be a better treatment option  for him. I was hesitant to be more aggressive with balloons as we did not want to perforate the LAD. At that point, cardiac surgery was called. The decision was made to place an Impella ventricular assist device. The patient was hypotensive and Levophed was started. He also became hypoxemic and he was changed to a nonrebreather mask to help with his  oxygen saturations.  The right groin was prepped. Femoral angiography showed that the right common femoral was suitable for a large bore sheath. A pigtail catheter was placed into the left ventricle. The 0.018 inch wire was then advanced into the left ventricle for the pigtail. The Impella device was advanced. The patient's hemodynamics improved immediately. The Levophed was weaned and his blood pressure maintained in the 100-110 systolic range. He did sound like he was in pulmonary edema with a cough. The surgeon came and reviewed the films. He agreed that emergent bypass surgery was the best treatment option. All the catheters were secured. The Impella position was adjusted.  Due to his hypoxemia and need for emergency surgery, the patient was intubated in the Cath Lab and then transported to the operating room. The guide catheter which been placed to the right radial artery was secured and the wires were left in place. The sheath was sewn in. The Impella catheter was also sewn in.  IMPRESSIONS:  1. Occluded left main coronary artery.  This was treated with balloon angioplasty with restoration of TIMI-3 flow into the circumflex system.. 2. Occluded left anterior descending artery and its branches.  This was treated with balloon angioplasty but did not have any significant flow return into the proximal to mid LAD. 3. Widely patent left circumflex artery and its branches. 4. Widely patent right coronary artery with right to left collaterals feeding particularly the distal LAD. 5. Likely severe left ventricular systolic dysfunction.  LVEDP 38 mmHg.  6. Cardiogenic shock. Successful placement of Impella ventricular assist device.   RECOMMENDATION:  Emergent, successful balloon angioplasty of the left main and LAD to restore some forward flow to his left system. Due to his multivessel disease, he will go to the operating room for emergency bypass surgery as percutaneous intervention with stents would  likely not be successful.  Further care will be based on his surgical course. He will likely need the Impella ventricular assist device for a few days after surgery.

## 2014-09-13 NOTE — Progress Notes (Signed)
Advised Dr. Maren BeachVanTrigt of critical HGB of 6.2 - 2 units of PRBCs ordered.  Advised that oncoming nurse is to page on-call physician with any further concerns.

## 2014-09-13 NOTE — Progress Notes (Signed)
Cryo sent up with pt from OR. Issue time not listed, Blood bank notified told to write in "time of out cooler." Cryo administered at 1350, end time 1430. VS saved in doc flowsheets. Blood bank notified as well as [employer] who said to insert progress note. Information verified by Renold GentaBrianna Holley-RN, Unit #[id number]. Ranald Alessio L

## 2014-09-13 NOTE — Progress Notes (Signed)
  Echocardiogram 2D Echocardiogram has been performed.  Leta JunglingCooper, Jaylun Fleener M 09/13/2014, 8:39 AM

## 2014-09-13 NOTE — ED Notes (Signed)
Shown high lab vaules to dr.nanavati

## 2014-09-13 NOTE — ED Notes (Signed)
Zoll in room and pads placed on patient.

## 2014-09-13 NOTE — Progress Notes (Signed)
Impella alarming placement signal lumen blocked.  Discussed with [employer] rep who advised that this is due to the narrow pulse pressure of the placement signal.  We did follow the procedure to aspirate and flush the placement signal lumen with no resolution of alarm.

## 2014-09-13 NOTE — Anesthesia Preprocedure Evaluation (Signed)
Anesthesia Evaluation  Patient identified by MRN, date of birth, ID band Patient awake    Reviewed: Allergy & Precautions, H&P , NPO status , Patient's Chart, lab work & pertinent test results, reviewed documented beta blocker date and time   Airway Mallampati: II  TM Distance: <3 FB     Dental  (+) Edentulous Upper, Edentulous Lower   Pulmonary shortness of breath, COPDCurrent Smoker,  breath sounds clear to auscultation  - wheezing      Cardiovascular hypertension, Pt. on home beta blockers and Pt. on medications + Past MI + dysrhythmias (palpitations) Rhythm:Regular     Neuro/Psych PSYCHIATRIC DISORDERS Anxiety Depression negative neurological ROS  negative psych ROS   GI/Hepatic Neg liver ROS, GERD-  Medicated,  Endo/Other  negative endocrine ROS  Renal/GU negative Renal ROS     Musculoskeletal negative musculoskeletal ROS (+)   Abdominal   Peds  Hematology negative hematology ROS (+)   Anesthesia Other Findings   Reproductive/Obstetrics negative OB ROS                             Anesthesia Physical  Anesthesia Plan  ASA: V and emergent  Anesthesia Plan: General   Post-op Pain Management:    Induction: Intravenous  Airway Management Planned: Oral ETT  Additional Equipment: Arterial line, CVP, PA Cath, Ultrasound Guidance Line Placement and TEE  Intra-op Plan:   Post-operative Plan: Post-operative intubation/ventilation  Informed Consent: I have reviewed the patients History and Physical, chart, labs and discussed the procedure including the risks, benefits and alternatives for the proposed anesthesia with the patient or authorized representative who has indicated his/her understanding and acceptance.   Dental advisory given  Plan Discussed with: CRNA  Anesthesia Plan Comments:         Anesthesia Quick Evaluation

## 2014-09-14 ENCOUNTER — Inpatient Hospital Stay (HOSPITAL_COMMUNITY): Payer: Medicare Other

## 2014-09-14 ENCOUNTER — Encounter (HOSPITAL_COMMUNITY): Payer: Self-pay | Admitting: Interventional Cardiology

## 2014-09-14 DIAGNOSIS — Z951 Presence of aortocoronary bypass graft: Secondary | ICD-10-CM

## 2014-09-14 DIAGNOSIS — J9601 Acute respiratory failure with hypoxia: Secondary | ICD-10-CM

## 2014-09-14 LAB — POCT I-STAT, CHEM 8
BUN: 10 mg/dL (ref 6–23)
BUN: 13 mg/dL (ref 6–23)
BUN: 18 mg/dL (ref 6–23)
CALCIUM ION: 0.9 mmol/L — AB (ref 1.13–1.30)
CALCIUM ION: 1.3 mmol/L (ref 1.13–1.30)
CREATININE: 1.1 mg/dL (ref 0.50–1.35)
CREATININE: 1.5 mg/dL — AB (ref 0.50–1.35)
Calcium, Ion: 1.12 mmol/L — ABNORMAL LOW (ref 1.13–1.30)
Chloride: 102 mmol/L (ref 96–112)
Chloride: 88 mmol/L — ABNORMAL LOW (ref 96–112)
Chloride: 99 mmol/L (ref 96–112)
Creatinine, Ser: 1.1 mg/dL (ref 0.50–1.35)
Glucose, Bld: 124 mg/dL — ABNORMAL HIGH (ref 70–99)
Glucose, Bld: 136 mg/dL — ABNORMAL HIGH (ref 70–99)
Glucose, Bld: 186 mg/dL — ABNORMAL HIGH (ref 70–99)
HCT: 19 % — ABNORMAL LOW (ref 39.0–52.0)
HEMATOCRIT: 13 % — AB (ref 39.0–52.0)
HEMATOCRIT: 28 % — AB (ref 39.0–52.0)
HEMOGLOBIN: 4.4 g/dL — AB (ref 13.0–17.0)
Hemoglobin: 6.5 g/dL — CL (ref 13.0–17.0)
Hemoglobin: 9.5 g/dL — ABNORMAL LOW (ref 13.0–17.0)
POTASSIUM: 3.8 mmol/L (ref 3.5–5.1)
Potassium: 3.2 mmol/L — ABNORMAL LOW (ref 3.5–5.1)
Potassium: 3.9 mmol/L (ref 3.5–5.1)
Sodium: 131 mmol/L — ABNORMAL LOW (ref 135–145)
Sodium: 135 mmol/L (ref 135–145)
Sodium: 158 mmol/L — ABNORMAL HIGH (ref 135–145)
TCO2: 18 mmol/L (ref 0–100)
TCO2: 22 mmol/L (ref 0–100)
TCO2: 46 mmol/L (ref 0–100)

## 2014-09-14 LAB — CBC
HCT: 15.9 % — ABNORMAL LOW (ref 39.0–52.0)
HCT: 33.8 % — ABNORMAL LOW (ref 39.0–52.0)
HCT: 29.9 % — ABNORMAL LOW (ref 39.0–52.0)
HCT: 28.2 % — ABNORMAL LOW (ref 39.0–52.0)
HCT: 28.2 % — ABNORMAL LOW (ref 39.0–52.0)
HEMOGLOBIN: 10.4 g/dL — AB (ref 13.0–17.0)
Hemoglobin: 5.5 g/dL — CL (ref 13.0–17.0)
Hemoglobin: 12 g/dL — ABNORMAL LOW (ref 13.0–17.0)
Hemoglobin: 10.8 g/dL — ABNORMAL LOW (ref 13.0–17.0)
Hemoglobin: 10.2 g/dL — ABNORMAL LOW (ref 13.0–17.0)
MCH: 28.5 pg (ref 26.0–34.0)
MCH: 29.7 pg (ref 26.0–34.0)
MCH: 30 pg (ref 26.0–34.0)
MCH: 30 pg (ref 26.0–34.0)
MCH: 30.4 pg (ref 26.0–34.0)
MCHC: 34.6 g/dL (ref 30.0–36.0)
MCHC: 35.5 g/dL (ref 30.0–36.0)
MCHC: 36.1 g/dL — ABNORMAL HIGH (ref 30.0–36.0)
MCHC: 36.9 g/dL — ABNORMAL HIGH (ref 30.0–36.0)
MCHC: 36.2 g/dL — ABNORMAL HIGH (ref 30.0–36.0)
MCV: 82.4 fL (ref 78.0–100.0)
MCV: 84.5 fL (ref 78.0–100.0)
MCV: 84.2 fL (ref 78.0–100.0)
MCV: 81.3 fL (ref 78.0–100.0)
MCV: 82.2 fL (ref 78.0–100.0)
PLATELETS: 127 10*3/uL — AB (ref 150–400)
PLATELETS: 108 10*3/uL — AB (ref 150–400)
Platelets: 51 10*3/uL — ABNORMAL LOW (ref 150–400)
Platelets: 112 10*3/uL — ABNORMAL LOW (ref 150–400)
Platelets: 100 10*3/uL — ABNORMAL LOW (ref 150–400)
RBC: 1.93 MIL/uL — ABNORMAL LOW (ref 4.22–5.81)
RBC: 4 MIL/uL — ABNORMAL LOW (ref 4.22–5.81)
RBC: 3.55 MIL/uL — AB (ref 4.22–5.81)
RBC: 3.47 MIL/uL — ABNORMAL LOW (ref 4.22–5.81)
RBC: 3.43 MIL/uL — ABNORMAL LOW (ref 4.22–5.81)
RDW: 17.4 % — ABNORMAL HIGH (ref 11.5–15.5)
RDW: 14.8 % (ref 11.5–15.5)
RDW: 15 % (ref 11.5–15.5)
RDW: 15.5 % (ref 11.5–15.5)
RDW: 15.9 % — ABNORMAL HIGH (ref 11.5–15.5)
WBC: 10.1 10*3/uL (ref 4.0–10.5)
WBC: 10.3 10*3/uL (ref 4.0–10.5)
WBC: 5.1 10*3/uL (ref 4.0–10.5)
WBC: 6.1 10*3/uL (ref 4.0–10.5)
WBC: 8.5 10*3/uL (ref 4.0–10.5)

## 2014-09-14 LAB — PREPARE CRYOPRECIPITATE
Unit division: 0
Unit division: 0
Unit division: 0

## 2014-09-14 LAB — BLOOD GAS, ARTERIAL
Acid-base deficit: 2.9 mmol/L — ABNORMAL HIGH (ref 0.0–2.0)
Bicarbonate: 21.2 mEq/L (ref 20.0–24.0)
Drawn by: 43098
FIO2: 0.85 %
MECHVT: 600 mL
O2 Saturation: 99.7 %
PEEP: 8 cmH2O
Patient temperature: 98.6
RATE: 22 resp/min
TCO2: 22.3 mmol/L (ref 0–100)
pCO2 arterial: 35.5 mmHg (ref 35.0–45.0)
pH, Arterial: 7.393 (ref 7.350–7.450)
pO2, Arterial: 216 mmHg — ABNORMAL HIGH (ref 80.0–100.0)

## 2014-09-14 LAB — CALCIUM, IONIZED: CALCIUM ION: 1.16 mmol/L (ref 1.12–1.32)

## 2014-09-14 LAB — POCT I-STAT 3, ART BLOOD GAS (G3+)
ACID-BASE DEFICIT: 6 mmol/L — AB (ref 0.0–2.0)
ACID-BASE EXCESS: 1 mmol/L (ref 0.0–2.0)
Acid-base deficit: 7 mmol/L — ABNORMAL HIGH (ref 0.0–2.0)
BICARBONATE: 20.5 meq/L (ref 20.0–24.0)
BICARBONATE: 20.7 meq/L (ref 20.0–24.0)
BICARBONATE: 24.7 meq/L — AB (ref 20.0–24.0)
BICARBONATE: 25.5 meq/L — AB (ref 20.0–24.0)
O2 Saturation: 100 %
O2 Saturation: 100 %
O2 Saturation: 100 %
O2 Saturation: 84 %
PH ART: 7.22 — AB (ref 7.350–7.450)
PH ART: 7.315 — AB (ref 7.350–7.450)
PO2 ART: 253 mmHg — AB (ref 80.0–100.0)
PO2 ART: 60 mmHg — AB (ref 80.0–100.0)
Patient temperature: 36.5
Patient temperature: 37.4
Patient temperature: 37.6
TCO2: 22 mmol/L (ref 0–100)
TCO2: 22 mmol/L (ref 0–100)
TCO2: 26 mmol/L (ref 0–100)
TCO2: 27 mmol/L (ref 0–100)
pCO2 arterial: 36.8 mmHg (ref 35.0–45.0)
pCO2 arterial: 43.8 mmHg (ref 35.0–45.0)
pCO2 arterial: 49.8 mmHg — ABNORMAL HIGH (ref 35.0–45.0)
pCO2 arterial: 50.4 mmHg — ABNORMAL HIGH (ref 35.0–45.0)
pH, Arterial: 7.275 — ABNORMAL LOW (ref 7.350–7.450)
pH, Arterial: 7.436 (ref 7.350–7.450)
pO2, Arterial: 275 mmHg — ABNORMAL HIGH (ref 80.0–100.0)
pO2, Arterial: 280 mmHg — ABNORMAL HIGH (ref 80.0–100.0)

## 2014-09-14 LAB — BASIC METABOLIC PANEL
Anion gap: 14 (ref 5–15)
BUN: 11 mg/dL (ref 6–23)
CO2: 20 mmol/L (ref 19–32)
Calcium: 8.8 mg/dL (ref 8.4–10.5)
Chloride: 103 mmol/L (ref 96–112)
Creatinine, Ser: 1.24 mg/dL (ref 0.50–1.35)
GFR calc Af Amer: 66 mL/min — ABNORMAL LOW (ref >90)
GFR calc non Af Amer: 57 mL/min — ABNORMAL LOW (ref >90)
Glucose, Bld: 188 mg/dL — ABNORMAL HIGH (ref 70–99)
Potassium: 3.2 mmol/L — ABNORMAL LOW (ref 3.5–5.1)
Sodium: 137 mmol/L (ref 135–145)

## 2014-09-14 LAB — PREPARE RBC (CROSSMATCH)

## 2014-09-14 LAB — PREPARE FRESH FROZEN PLASMA
Unit division: 0
Unit division: 0

## 2014-09-14 LAB — CARBOXYHEMOGLOBIN
Carboxyhemoglobin: 1.1 % (ref 0.5–1.5)
Carboxyhemoglobin: 1.4 % (ref 0.5–1.5)
Methemoglobin: 1.4 % (ref 0.0–1.5)
Methemoglobin: 1.4 % (ref 0.0–1.5)
O2 Saturation: 73.4 %
O2 Saturation: 79.5 %
Total hemoglobin: 12 g/dL — ABNORMAL LOW (ref 13.5–18.0)
Total hemoglobin: 9.7 g/dL — ABNORMAL LOW (ref 13.5–18.0)

## 2014-09-14 LAB — GLUCOSE, CAPILLARY
GLUCOSE-CAPILLARY: 132 mg/dL — AB (ref 70–99)
GLUCOSE-CAPILLARY: 182 mg/dL — AB (ref 70–99)
GLUCOSE-CAPILLARY: 170 mg/dL — AB (ref 70–99)
GLUCOSE-CAPILLARY: 267 mg/dL — AB (ref 70–99)
GLUCOSE-CAPILLARY: 120 mg/dL — AB (ref 70–99)
GLUCOSE-CAPILLARY: 117 mg/dL — AB (ref 70–99)
Glucose-Capillary: 114 mg/dL — ABNORMAL HIGH (ref 70–99)
Glucose-Capillary: 96 mg/dL (ref 70–99)
Glucose-Capillary: 121 mg/dL — ABNORMAL HIGH (ref 70–99)
Glucose-Capillary: 138 mg/dL — ABNORMAL HIGH (ref 70–99)
Glucose-Capillary: 57 mg/dL — ABNORMAL LOW (ref 70–99)
Glucose-Capillary: 169 mg/dL — ABNORMAL HIGH (ref 70–99)
Glucose-Capillary: 130 mg/dL — ABNORMAL HIGH (ref 70–99)
Glucose-Capillary: 146 mg/dL — ABNORMAL HIGH (ref 70–99)
Glucose-Capillary: 142 mg/dL — ABNORMAL HIGH (ref 70–99)
Glucose-Capillary: 127 mg/dL — ABNORMAL HIGH (ref 70–99)
Glucose-Capillary: 115 mg/dL — ABNORMAL HIGH (ref 70–99)

## 2014-09-14 LAB — PREPARE PLATELET PHERESIS
UNIT DIVISION: 0
Unit division: 0
Unit division: 0

## 2014-09-14 LAB — LACTIC ACID, PLASMA
LACTIC ACID, VENOUS: 3.1 mmol/L — AB (ref 0.5–2.0)
Lactic Acid, Venous: 7.4 mmol/L (ref 0.5–2.0)
Lactic Acid, Venous: 5.1 mmol/L (ref 0.5–2.0)
Lactic Acid, Venous: 3.1 mmol/L (ref 0.5–2.0)

## 2014-09-14 LAB — HEMOGLOBIN A1C
Hgb A1c MFr Bld: 5.3 % (ref 4.8–5.6)
Mean Plasma Glucose: 105 mg/dL

## 2014-09-14 LAB — CREATININE, SERUM
CREATININE: 1.76 mg/dL — AB (ref 0.50–1.35)
GFR calc Af Amer: 43 mL/min — ABNORMAL LOW (ref >90)
GFR calc non Af Amer: 37 mL/min — ABNORMAL LOW (ref >90)

## 2014-09-14 LAB — MAGNESIUM
MAGNESIUM: 1.9 mg/dL (ref 1.5–2.5)
MAGNESIUM: 1.7 mg/dL (ref 1.5–2.5)
Magnesium: 1.9 mg/dL (ref 1.5–2.5)

## 2014-09-14 LAB — PROTIME-INR
INR: 1.6 — ABNORMAL HIGH (ref 0.00–1.49)
Prothrombin Time: 19.2 seconds — ABNORMAL HIGH (ref 11.6–15.2)

## 2014-09-14 LAB — FIBRINOGEN: Fibrinogen: 284 mg/dL (ref 204–475)

## 2014-09-14 LAB — TROPONIN I

## 2014-09-14 MED ORDER — HEPARIN (PORCINE) IN NACL 100-0.45 UNIT/ML-% IJ SOLN
200.0000 [IU]/h | INTRAMUSCULAR | Status: DC
  Administered 2014-09-16: 200 [IU]/h via INTRAVENOUS
  Administered 2014-09-18 – 2014-09-20 (×3): 900 [IU]/h via INTRAVENOUS
  Filled 2014-09-14 (×9): qty 250

## 2014-09-14 MED ORDER — SODIUM CHLORIDE 0.9 % IV SOLN
1.0000 g | Freq: Once | INTRAVENOUS | Status: AC
  Administered 2014-09-14: 1 g via INTRAVENOUS
  Filled 2014-09-14: qty 10

## 2014-09-14 MED ORDER — SODIUM CHLORIDE 0.9 % IV SOLN: Freq: Once | INTRAVENOUS | Status: AC

## 2014-09-14 MED ORDER — IPRATROPIUM-ALBUTEROL 0.5-2.5 (3) MG/3ML IN SOLN
3.0000 mL | Freq: Four times a day (QID) | RESPIRATORY_TRACT | Status: DC
  Administered 2014-09-14 – 2014-09-21 (×28): 3 mL via RESPIRATORY_TRACT
  Filled 2014-09-14 (×28): qty 3

## 2014-09-14 MED ORDER — SODIUM BICARBONATE 8.4 % IV SOLN
INTRAVENOUS | Status: AC
  Administered 2014-09-14: 50 meq
  Filled 2014-09-14: qty 50

## 2014-09-14 MED ORDER — PANTOPRAZOLE SODIUM 40 MG IV SOLR
40.0000 mg | INTRAVENOUS | Status: DC
  Administered 2014-09-14 – 2014-09-24 (×11): 40 mg via INTRAVENOUS
  Filled 2014-09-14 (×15): qty 40

## 2014-09-14 MED ORDER — DEXMEDETOMIDINE HCL IN NACL 400 MCG/100ML IV SOLN
0.4000 ug/kg/h | INTRAVENOUS | Status: DC
  Administered 2014-09-14 – 2014-09-16 (×6): 0.7 ug/kg/h via INTRAVENOUS
  Administered 2014-09-16: 1 ug/kg/h via INTRAVENOUS
  Administered 2014-09-16: 0.7 ug/kg/h via INTRAVENOUS
  Administered 2014-09-17 – 2014-09-19 (×14): 1.2 ug/kg/h via INTRAVENOUS
  Administered 2014-09-19: 1.199 ug/kg/h via INTRAVENOUS
  Administered 2014-09-19 – 2014-09-21 (×13): 1.2 ug/kg/h via INTRAVENOUS
  Administered 2014-09-22 – 2014-09-26 (×20): 1 ug/kg/h via INTRAVENOUS
  Administered 2014-09-27: 0.7 ug/kg/h via INTRAVENOUS
  Administered 2014-09-27: 0.6 ug/kg/h via INTRAVENOUS
  Filled 2014-09-14 (×58): qty 100

## 2014-09-14 MED ORDER — COAGULATION FACTOR VIIA RECOMB 1 MG IV SOLR
45.0000 ug/kg | Freq: Once | INTRAVENOUS | Status: AC
  Administered 2014-09-14: 3000 ug via INTRAVENOUS
  Filled 2014-09-14: qty 3

## 2014-09-14 MED ORDER — DEXTROSE 5 % IV SOLN
50000.0000 [IU] | INTRAVENOUS | Status: DC
  Administered 2014-09-15 – 2014-09-20 (×3): 50000 [IU]
  Filled 2014-09-14 (×8): qty 10

## 2014-09-14 MED ORDER — SODIUM BICARBONATE 8.4 % IV SOLN
50.0000 meq | Freq: Once | INTRAVENOUS | Status: AC
  Administered 2014-09-14: 50 meq via INTRAVENOUS
  Filled 2014-09-14: qty 50

## 2014-09-14 MED ORDER — POTASSIUM CHLORIDE 10 MEQ/50ML IV SOLN
10.0000 meq | INTRAVENOUS | Status: AC
  Administered 2014-09-14 (×3): 10 meq via INTRAVENOUS
  Filled 2014-09-14 (×3): qty 50

## 2014-09-14 MED ORDER — BUDESONIDE 0.5 MG/2ML IN SUSP
0.5000 mg | Freq: Two times a day (BID) | RESPIRATORY_TRACT | Status: DC
Start: 2014-09-14 — End: 2014-10-05
  Administered 2014-09-14 – 2014-10-04 (×40): 0.5 mg via RESPIRATORY_TRACT
  Filled 2014-09-14 (×46): qty 2

## 2014-09-14 MED ORDER — LEVALBUTEROL HCL 0.63 MG/3ML IN NEBU
0.6300 mg | INHALATION_SOLUTION | RESPIRATORY_TRACT | Status: DC | PRN
  Administered 2014-09-19: 0.63 mg via RESPIRATORY_TRACT
  Filled 2014-09-14 (×2): qty 3

## 2014-09-14 MED ORDER — LEVALBUTEROL HCL 1.25 MG/0.5ML IN NEBU
1.2500 mg | INHALATION_SOLUTION | Freq: Four times a day (QID) | RESPIRATORY_TRACT | Status: DC
  Administered 2014-09-14 (×2): 1.25 mg via RESPIRATORY_TRACT
  Filled 2014-09-14 (×5): qty 0.5

## 2014-09-14 MED FILL — Magnesium Sulfate Inj 50%: INTRAMUSCULAR | Qty: 10 | Status: AC

## 2014-09-14 MED FILL — Heparin Sodium (Porcine) Inj 1000 Unit/ML: INTRAMUSCULAR | Qty: 30 | Status: AC

## 2014-09-14 MED FILL — Sodium Chloride IV Soln 0.9%: INTRAVENOUS | Qty: 50 | Status: AC

## 2014-09-14 MED FILL — Potassium Chloride Inj 2 mEq/ML: INTRAVENOUS | Qty: 40 | Status: AC

## 2014-09-14 NOTE — Progress Notes (Signed)
Patient Profile: 74 y/o male admitted for acute anterolateral MI. 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 Taken for emergent surgical revascularization. Day 1 s/p 3V CABG.    Subjective: Intubated. Critically ill.   Objective: Vital signs in last 24 hours: Temp:  [93.9 F (34.4 C)-100.2 F (37.9 C)] 99.3 F (37.4 C) (04/14 1400) Pulse Rate:  [25-106] 89 (04/14 1400) Resp:  [10-32] 22 (04/14 1400) BP: (34-98)/(14-68) 66/54 mmHg (04/13 1900) SpO2:  [54 %-100 %] 100 % (04/14 1400) Arterial Line BP: (53-150)/(25-117) 97/76 mmHg (04/14 1400) FiO2 (%):  [50 %-100 %] 85 % (04/14 1200) Weight:  [161 lb 13.1 oz (73.4 kg)] 161 lb 13.1 oz (73.4 kg) (04/14 0500)    Intake/Output from previous day: 04/13 0701 - 04/14 0700 In: 19281.7 [I.V.:8111.9; VQQVZ:5638; NG/GT:30; IV Piggyback:3350] Out: 75643 [Urine:5195; Emesis/NG output:500; Blood:2400; Chest Tube:3950] Intake/Output this shift: Total I/O In: 1644.5 [I.V.:1335.1; Other:99.4; NG/GT:60; IV Piggyback:150] Out: 700 [Urine:175; Emesis/NG output:100; Chest Tube:425]  Medications Current Facility-Administered Medications  Medication Dose Route Frequency Provider Last Rate Last Dose  . 0.45 % sodium chloride infusion   Intravenous Continuous PRN Rowe Clack, PA-C 20 mL/hr at 09/14/14 0600    . 0.9 %  sodium chloride infusion  250 mL Intravenous PRN Mariea Stable, MD 20 mL/hr at 09/13/14 1325 250 mL at 09/13/14 1325  . 0.9 %  sodium chloride infusion  250 mL Intravenous Continuous Rowe Clack, PA-C 1 mL/hr at 09/14/14 0611 250 mL at 09/14/14 0611  . 0.9 %  sodium chloride infusion   Intravenous Continuous Wayne E Gold, PA-C 20 mL/hr at 09/13/14 1315 20 mL/hr at 09/13/14 1315  . 0.9 %  sodium chloride infusion   Intravenous Once Kathlee Nations Trigt, MD      . 0.9 %  sodium chloride infusion   Intravenous Once Kathlee Nations Trigt, MD      . 0.9 %  sodium chloride infusion   Intravenous Once Kerin Perna, MD      . acetaminophen (TYLENOL)  tablet 1,000 mg  1,000 mg Oral 4 times per day Rowe Clack, PA-C   1,000 mg at 09/14/14 0000   Or  . acetaminophen (TYLENOL) solution 1,000 mg  1,000 mg Per Tube 4 times per day Rowe Clack, PA-C   1,000 mg at 09/14/14 1147  . acetaminophen (TYLENOL) tablet 650 mg  650 mg Oral Q4H PRN Mariea Stable, MD      . albumin human 5 % solution 12.5 g  12.5 g Intravenous TID PRN Kerin Perna, MD   12.5 g at 09/13/14 1932  . amiodarone (NEXTERONE PREMIX) 360 MG/200ML (1.8 mg/mL) IV infusion  30 mg/hr Intravenous Continuous Kerin Perna, MD 16.7 mL/hr at 09/14/14 1421 30 mg/hr at 09/14/14 1421  . antiseptic oral rinse (CPC / CETYLPYRIDINIUM CHLORIDE 0.05%) solution 7 mL  7 mL Mouth Rinse QID Kerin Perna, MD   7 mL at 09/14/14 1200  . aspirin EC tablet 325 mg  325 mg Oral Daily Wayne E Gold, PA-C       Or  . aspirin chewable tablet 324 mg  324 mg Per Tube Daily Rowe Clack, PA-C   324 mg at 09/14/14 0901  . bisacodyl (DULCOLAX) EC tablet 10 mg  10 mg Oral Daily Rowe Clack, PA-C   10 mg at 09/14/14 1000   Or  . bisacodyl (DULCOLAX) suppository 10 mg  10 mg Rectal Daily Rowe Clack, PA-C      .  budesonide (PULMICORT) nebulizer solution 0.5 mg  0.5 mg Nebulization BID Rahul P Desai, PA-C      . chlorhexidine (PERIDEX) 0.12 % solution 15 mL  15 mL Mouth Rinse BID Kerin Perna, MD   15 mL at 09/14/14 0747  . dexmedetomidine (PRECEDEX) 400 MCG/100ML (4 mcg/mL) infusion  0.4-1.2 mcg/kg/hr Intravenous Titrated Kerin Perna, MD 12.8 mL/hr at 09/14/14 1422 0.7 mcg/kg/hr at 09/14/14 1422  . docusate sodium (COLACE) capsule 200 mg  200 mg Oral Daily Wayne E Gold, PA-C   200 mg at 09/14/14 1000  . DOPamine (INTROPIN) 800 mg in dextrose 5 % 250 mL (3.2 mg/mL) infusion  0-5 mcg/kg/min Intravenous Titrated Kerin Perna, MD 5.7 mL/hr at 09/14/14 1200 5 mcg/kg/min at 09/14/14 1200  . EPINEPHrine (ADRENALIN) 4 mg in dextrose 5 % 250 mL (0.016 mg/mL) infusion  0-20 mcg/min Intravenous Titrated Kerin Perna, MD 75 mL/hr at 09/14/14 1308 20 mcg/min at 09/14/14 1308  . [START ON 09/15/2014] heparin 50,000 Units in dextrose 5 % 1,000 mL (50 Units/mL)  50,000 Units Intracatheter Continuous Kerin Perna, MD      . Melene Muller ON 09/15/2014] heparin ADULT infusion 100 units/mL (25000 units/250 mL)  200-700 Units/hr Intravenous Continuous Kerin Perna, MD      . insulin regular (NOVOLIN R,HUMULIN R) 250 Units in sodium chloride 0.9 % 250 mL (1 Units/mL) infusion   Intravenous Continuous Glenice Laine Gold, PA-C 28.6 mL/hr at 09/14/14 0956 28.6 Units/hr at 09/14/14 0956  . insulin regular bolus via infusion 0-10 Units  0-10 Units Intravenous TID WC Wayne E Gold, PA-C   0 Units at 09/13/14 1800  . ipratropium-albuterol (DUONEB) 0.5-2.5 (3) MG/3ML nebulizer solution 3 mL  3 mL Nebulization Q6H Rahul P Desai, PA-C      . lactated ringers infusion   Intravenous Continuous Rowe Clack, PA-C 20 mL/hr at 09/13/14 1315    . lactated ringers infusion   Intravenous Continuous Rowe Clack, PA-C 20 mL/hr at 09/14/14 0825 20 mL/hr at 09/14/14 0825  . levalbuterol (XOPENEX) nebulizer solution 0.63 mg  0.63 mg Nebulization Q3H PRN Rahul P Desai, PA-C      . levofloxacin (LEVAQUIN) IVPB 750 mg  750 mg Intravenous Q24H Kerin Perna, MD   750 mg at 09/14/14 0901  . lidocaine (cardiac) IV  infusion 4 mg/mL  1 mg/min Intravenous Titrated Kerin Perna, MD 15 mL/hr at 09/14/14 0800 1 mg/min at 09/14/14 0800  . metoprolol (LOPRESSOR) injection 2.5-5 mg  2.5-5 mg Intravenous Q2H PRN Wayne E Gold, PA-C      . metoprolol tartrate (LOPRESSOR) tablet 12.5 mg  12.5 mg Oral BID Wayne E Gold, PA-C   12.5 mg at 09/13/14 1645   Or  . metoprolol tartrate (LOPRESSOR) 25 mg/10 mL oral suspension 12.5 mg  12.5 mg Per Tube BID Wayne E Gold, PA-C      . midazolam (VERSED) injection 2 mg  2 mg Intravenous Q1H PRN Wayne E Gold, PA-C   2 mg at 09/14/14 1421  . milrinone (PRIMACOR) 20 MG/100ML (0.2 mg/mL) infusion  0.3 mcg/kg/min Intravenous  Continuous Kerin Perna, MD 3.7 mL/hr at 09/14/14 1200 0.2 mcg/kg/min at 09/14/14 1200  . morphine 2 MG/ML injection 2-5 mg  2-5 mg Intravenous Q1H PRN Wayne E Gold, PA-C   2 mg at 09/14/14 1051  . nitroGLYCERIN (NITROSTAT) SL tablet 0.4 mg  0.4 mg Sublingual Q5 Min x 3 PRN Mariea Stable, MD      .  nitroGLYCERIN 50 mg in dextrose 5 % 250 mL (0.2 mg/mL) infusion  0-100 mcg/min Intravenous Titrated Rowe ClackWayne E Gold, PA-C   Stopped at 09/13/14 1315  . norepinephrine (LEVOPHED) 16 mg in dextrose 5 % 250 mL (0.064 mg/mL) infusion  0-70 mcg/min Intravenous Titrated [name], MD 32.8 mL/hr at 09/14/14 1307 35 mcg/min at 09/14/14 1307  . ondansetron (ZOFRAN) injection 4 mg  4 mg Intravenous Q6H PRN Wayne E Gold, PA-C      . oxyCODONE (Oxy IR/ROXICODONE) immediate release tablet 5-10 mg  5-10 mg Oral Q3H PRN Wayne E Gold, PA-C      . pantoprazole (PROTONIX) injection 40 mg  40 mg Intravenous Q24H [name], MD   40 mg at 09/14/14 [id number]  . phenylephrine (NEO-SYNEPHRINE) 40 mg in dextrose 5 % 250 mL (0.16 mg/mL) infusion  0-50 mcg/min Intravenous Titrated [name], MD   Stopped at 09/14/14 0245  . simvastatin (ZOCOR) tablet 20 mg  20 mg Oral QPM Mariea StableManrique Alvarez, MD      . sodium chloride 0.9 % injection 3 mL  3 mL Intravenous Q12H Mariea StableManrique Alvarez, MD   3 mL at 09/14/14 0829  . sodium chloride 0.9 % injection 3 mL  3 mL Intravenous PRN Mariea StableManrique Alvarez, MD      . sodium chloride 0.9 % injection 3 mL  3 mL Intravenous Q12H Wayne E Gold, PA-C   3 mL at 09/14/14 0830  . sodium chloride 0.9 % injection 3 mL  3 mL Intravenous PRN Rowe ClackWayne E Gold, PA-C      . traMADol Janean Sark(ULTRAM) tablet 50-100 mg  50-100 mg Oral Q4H PRN Wayne E Gold, PA-C      . vancomycin (VANCOCIN) IVPB 1000 mg/200 mL premix  1,000 mg Intravenous Q12H [name], MD   1,000 mg at 09/14/14 0554    PE: General appearance: Intubated. Critically ill Lungs: clear to auscultation bilaterally Heart: regular rate and  rhythm Pulses: 2+ and symmetric Neurologic:Intubated and sedated  Lab Results:   Recent Labs  09/14/14 0020 09/14/14 0027 09/14/14 0322 09/14/14 0730  WBC 8.5  --  5.1 6.1  HGB 5.5* 4.4* 12.0* 10.8*  HCT 15.9* 13.0* 33.8* 29.9*  PLT 51*  --  112* 127*   BMET  Recent Labs  09/13/14 0357  09/13/14 1400  09/13/14 2352 09/14/14 0027 09/14/14 0322  NA 134*  < > 138  < > 135 158* 137  K 4.1  < > 3.1*  < > 3.9 3.2* 3.2*  CL 100  < > 108  < > 102 88* 103  CO2 25  --  23  --   --   --  20  GLUCOSE 130*  < > 265*  < > 136* 186* 188*  BUN 11  < > 9  < > 13 10 11   CREATININE 1.11  < > 0.90  < > 1.10 1.10 1.24  CALCIUM 8.7  --  7.5*  --   --   --  8.8  < > = values in this interval not displayed. PT/INR  Recent Labs  09/13/14 0357 09/13/14 1859 09/14/14 0323  LABPROT 13.8 27.0* 19.2*  INR 1.05 2.48* 1.60*    Assessment/Plan  Active Problems:   STEMI (ST elevation myocardial infarction)   Cardiogenic shock   Acute MI, anterolateral wall, initial episode of care   SOB (shortness of breath)   S/P CABG x 3   Patient in SICU. He remains critically ill after emergent  CABG for multivessel CAD. Cardiogenic shock on Impella and multiple pressor support. CT surgery and Critical Care managing. Nothing to add. General Cardiology will be available if needed.    LOS: 1 day    Brittainy M. Delmer Islam 09/14/2014 2:23 PM  Attending Note:   The patient was seen and examined.  Agree with assessment and plan as noted above.  Changes made to the above note as needed.  Continue supportive care Critically ill    Alvia Grove., MD, [facility] 09/14/2014, 3:39 PM [address]  [address] Office [phone] Pager [phone]

## 2014-09-14 NOTE — Progress Notes (Signed)
Upon initial assessment of the patient, it was noted that his pressure via arterial line and impella sense device was non-pulsatile. MAP's are correlating via devices and in the 60-70's. O2 saturation also non-pulsatile. MD Laneta SimmersBartle and PA Gold made aware. Order for 2D echo  Will monitor   [name], [name]

## 2014-09-14 NOTE — Progress Notes (Signed)
CRITICAL VALUE ALERT  Critical value received:  Lactic acid 5.1  Date of notification:  09/14/2014  Time of notification:  0417  Critical value read back:Yes.    Nurse who received alert:  Roselie AwkwardShannon Love  MD notified (1st page):  Bartle  Time of first page:  0440  MD notified (2nd page):  Time of second page:  Responding MD:  Laneta SimmersBartle  Time MD responded:  [phone]

## 2014-09-14 NOTE — Progress Notes (Signed)
[facility] BRIEF SICU PROGRESS NOTE  1 Day Post-Op  S/P Procedure(s) (LRB): CORONARY ARTERY BYPASS GRAFTING (CABG), ON PUMP, TIMES THREE, USING LEFT INTERNAL MAMMARY ARTERY, LEFT GREATER SAPHENOUS VEIN HARVESTED ENDOSCOPICALLY (N/A) PATENT FORAMEN OVALE CLOSURE   Sedated on vent AV paced Impella flows stable 2.8-3.0 L/min MAP 70-80 on Epi @ 20 Dop @5  Milrinone @ 0.2 Levophed @33  PA pressures relatively low O2 sats 100% on FiO2 85% UOP 20-60 mL/hr Labs stable w/ Hgb 10.4, creatinine up to 1.5  Plan: Continue current plan.  Wean drips slowly as tolerated.  Wean FiO2  [name] 09/14/2014 8:21 PM

## 2014-09-14 NOTE — Progress Notes (Signed)
Pt. Was breath-staking and his PIP continued to rise from the 50's to the 60's, unresolved by HHN, suctioning, or repositioning. Pt placed in PRVC mode and PIP decreased to low 40's. Nurses at bedside.

## 2014-09-14 NOTE — Progress Notes (Signed)
CRITICAL VALUE ALERT  Critical value received:  HGB 5.5  Date of notification:  09/14/14  Time of notification:  0104  Critical value read back:Yes.    Nurse who received alert:  Roselie AwkwardShannon Liela Rylee, RN  MD notified (1st page):  Dr. Laneta SimmersBartle at bedside

## 2014-09-14 NOTE — Progress Notes (Signed)
Dr. Laneta SimmersBartle updated with morning lab results and informed of low cardiac index. Orders received for a repeat CBC.  Will continue to monitor.

## 2014-09-14 NOTE — Progress Notes (Signed)
Vent MODE change: Pt. Was breath-staking and his PIP continued to rise from the 50's to the 60's, unresolved by HHN, suctioning, or repositioning. Pt placed in PRVC mode and PIP decreased to low 40's. Nurses at bedside.

## 2014-09-14 NOTE — Progress Notes (Signed)
Order frequency of HHN remains Q6 hours, but treatment times have been modified per RT treatment times.

## 2014-09-14 NOTE — Progress Notes (Signed)
Assisted the CODE team in resuscitating this patient. Pt ultimately placed back on the vent.

## 2014-09-14 NOTE — Progress Notes (Signed)
Patient ID: [name], male   DOB: 1941/05/09, 74 y.o.   MRN: [id number]  Cardiothoracic Surgery  Patient had been " relatively" stable this evening on multiple high dose inotropes and vasopressors but then BP started to decline and he suddenly lost pacemaker capture and rhythm with no pulse. Code blue called and CPR started. Pt given multiple amps of bicarb for acidosis and epi with return of wide complex tachycardia but minimal BP. Hgb from lab came back 5.5. His chest tube output this evening was only about 100 cc per hour through all the tubes together. After resuscitation started about 1500 cc drained from the pleural tubes that looked dark. MT output has remained low. 6 units of blood ordered and is just about it. He was cardioverted and is now paced at 90. With volume replacement he now has systolic BP in the 90's. Review of his labs from earlier this evening shows ongoing coagulopathy with fibrinogen of 148, INR of 2.48, PTT 83.  Plts now 51K. Will correct coagulopathy. CXR looks ok with narrow heart shadow and no significant pleural effusion so I think the chest tubes are draining ok now. It may be that the pleural tubes clotted off earlier. Cardiology fellow did a bedside echo with the small portable ultrasound but could not see much. The MT's seem to be draining ok so I am less suspicious about tamponade. Overall prognosis is not very good for this critically ill patient.

## 2014-09-14 NOTE — Anesthesia Postprocedure Evaluation (Signed)
Anesthesia Post Note  Patient: Justin MattocksDennis W Geffre  Procedure(s) Performed: Procedure(s) (LRB): CORONARY ARTERY BYPASS GRAFTING (CABG), ON PUMP, TIMES THREE, USING LEFT INTERNAL MAMMARY ARTERY, LEFT GREATER SAPHENOUS VEIN HARVESTED ENDOSCOPICALLY (N/A) PATENT FORAMEN OVALE CLOSURE  Anesthesia type: General  Patient location: SICU  Post pain: sedated and intubated  Post assessment: Post-op Vital signs reviewed  Last Vitals: BP 66/54 mmHg  Pulse 91  Temp(Src) 37.2 C (Core (Comment))  Resp 22  Ht 5\' 7"  (1.702 m)  Wt 161 lb 13.1 oz (73.4 kg)  BMI 25.34 kg/m2  SpO2 100%  Post vital signs: Reviewed  Level of consciousness:  sedated and intubated  Postop course: pt on maximum medical therapy with very little improvement. Coded last night as well.   Complications: No apparent anesthesia complications

## 2014-09-14 NOTE — Progress Notes (Signed)
Decreased fio2 to 60 % per abg results. Will continue to decrease throughout the night

## 2014-09-14 NOTE — Progress Notes (Addendum)
T[facility] DAILY ICU PROGRESS NOTE                   [address]            [place] [id number]          [phone]   1 Day Post-Op Procedure(s) (LRB): CORONARY ARTERY BYPASS GRAFTING (CABG), ON PUMP, TIMES THREE, USING LEFT INTERNAL MAMMARY ARTERY, LEFT GREATER SAPHENOUS VEIN HARVESTED ENDOSCOPICALLY (N/A) PATENT FORAMEN OVALE CLOSURE  Total Length of Stay:  LOS: 1 day   Subjective: Events noted cardiac arrest last night from prob tamponade from occluded chest tubes currently no inherent LV ejection with full LVAD support 3L/min  adequate RV function with CO-ox 72% systemic flow Impella generated. Complete asystole under the pacer- av paced with good thresholds. Patient responds to stimuli Persistent coagulopathy- hold heparin today and give one dose of novoseven  Objective: Vital signs in last 24 hours: Temp:  [93.9 F (34.4 C)-100.2 F (37.9 C)] 100.2 F (37.9 C) (04/14 0715) Pulse Rate:  [31-106] 42 (04/14 0630) Cardiac Rhythm:  [-] Atrial paced (04/14 0400) Resp:  [10-32] 22 (04/14 0715) BP: (34-98)/(14-68) 66/54 mmHg (04/13 1900) SpO2:  [54 %-100 %] 86 % (04/14 0729) Arterial Line BP: (53-150)/(25-117) 74/71 mmHg (04/14 0645) FiO2 (%):  [50 %-100 %] 85 % (04/14 0729) Weight:  [161 lb 13.1 oz (73.4 kg)] 161 lb 13.1 oz (73.4 kg) (04/14 0500)  Filed Weights   09/13/14 0358 09/14/14 0500  Weight: 135 lb (61.236 kg) 161 lb 13.1 oz (73.4 kg)    Weight change: 26 lb 13.1 oz (12.164 kg)   Hemodynamic parameters for last 24 hours: PAP: (13-66)/(7-50) 26/20 mmHg CVP:  [9 mmHg-46 mmHg] 16 mmHg CO:  [2.4 L/min-4.4 L/min] 2.4 L/min CI:  [1.4 L/min/m2-2.6 L/min/m2] 1.4 L/min/m2  Intake/Output from previous day: 04/13 0701 - 04/14 0700 In: 19281.7 [I.V.:8111.9; BJYNW:2956; NG/GT:30; IV Piggyback:3350] Out: 21308 [Urine:5195; Emesis/NG output:500; Blood:2400; Chest Tube:3950]  Vent Mode:  [-] PRVC FiO2 (%):  [50 %-100 %] 85 % Set Rate:  [12 bmp-22 bmp] 22  bmp Vt Set:  [600 mL] 600 mL PEEP:  [5 cmH20-8 cmH20] 8 cmH20 Pressure Support:  [10 cmH20] 10 cmH20 Plateau Pressure:  [12 cmH20-32 cmH20] 32 cmH20   Intake/Output this shift:    Current Meds: Scheduled Meds: . sodium chloride   Intravenous Once  . sodium chloride   Intravenous Once  . sodium chloride   Intravenous Once  . acetaminophen  1,000 mg Oral 4 times per day   Or  . acetaminophen (TYLENOL) oral liquid 160 mg/5 mL  1,000 mg Per Tube 4 times per day  . antiseptic oral rinse  7 mL Mouth Rinse QID  . aspirin EC  325 mg Oral Daily   Or  . aspirin  324 mg Per Tube Daily  . bisacodyl  10 mg Oral Daily   Or  . bisacodyl  10 mg Rectal Daily  . budesonide (PULMICORT) nebulizer solution  0.25 mg Nebulization BID  . chlorhexidine  15 mL Mouth Rinse BID  . docusate sodium  200 mg Oral Daily  . escitalopram  15 mg Oral Daily  . insulin regular  0-10 Units Intravenous TID WC  . levalbuterol  1.25 mg Nebulization Q6H  . levofloxacin (LEVAQUIN) IV  750 mg Intravenous Q24H  . metoprolol tartrate  12.5 mg Oral BID   Or  . metoprolol tartrate  12.5 mg Per Tube BID  . [START ON 09/15/2014] pantoprazole  40 mg Oral Daily  . potassium chloride  10 mEq Intravenous Q1 Hr x 3  . simvastatin  20 mg Oral QPM  . sodium chloride  3 mL Intravenous Q12H  . sodium chloride  3 mL Intravenous Q12H  . tiotropium  18 mcg Inhalation Daily  . vancomycin  1,000 mg Intravenous Q12H   Continuous Infusions: . sodium chloride 20 mL/hr at 09/14/14 0600  . sodium chloride 250 mL (09/14/14 0611)  . sodium chloride 20 mL/hr (09/13/14 1315)  . amiodarone 30 mg/hr (09/14/14 0532)  . dexmedetomidine 0.7 mcg/kg/hr (09/14/14 0532)  . DOPamine 5 mcg/kg/min (09/13/14 1900)  . EPINEPHrine 4 mg in dextrose 5% 250 mL infusion (16 mcg/mL) 20 mcg/min (09/14/14 0555)  . impella catheter heparin 50 unit/mL in dextrose 5%    . heparin    . insulin (NOVOLIN-R) infusion 8.4 Units/hr (09/14/14 0500)  . lactated ringers  20 mL/hr at 09/13/14 1315  . lactated ringers 20 mL/hr (09/13/14 1315)  . lidocaine 1 mg/min (09/13/14 1900)  . milrinone 0.2 mcg/kg/min (09/13/14 1900)  . nitroGLYCERIN Stopped (09/13/14 1315)  . norepinephrine (LEVOPHED) Adult infusion 10 mcg/min (09/14/14 0600)  . phenylephrine (NEO-SYNEPHRINE) Adult infusion Stopped (09/14/14 0245)   PRN Meds:.sodium chloride, sodium chloride, acetaminophen, albumin human, metoprolol, midazolam, morphine injection, nitroGLYCERIN, ondansetron (ZOFRAN) IV, oxyCODONE, sodium chloride, sodium chloride, traMADol  Sedated on vent, responds to painful stimuli, pupils react( has had cataract sugery Coarse breath sounds Mild edema + doppler signal in extremities  Lab Results: CBC: Recent Labs  09/14/14 0020 09/14/14 0027 09/14/14 0322  WBC 8.5  --  5.1  HGB 5.5* 4.4* 12.0*  HCT 15.9* 13.0* 33.8*  PLT 51*  --  112*   BMET:  Recent Labs  09/13/14 1400  09/14/14 0027 09/14/14 0322  NA 138  < > 158* 137  K 3.1*  < > 3.2* 3.2*  CL 108  < > 88* 103  CO2 23  --   --  20  GLUCOSE 265*  < > 186* 188*  BUN 9  < > 10 11  CREATININE 0.90  < > 1.10 1.24  CALCIUM 7.5*  --   --  8.8  < > = values in this interval not displayed.  PT/INR:  Recent Labs  09/14/14 0323  LABPROT 19.2*  INR 1.60*   ABG    Component Value Date/Time   PHART 7.393 09/14/2014 0403   PCO2ART 35.5 09/14/2014 0403   PO2ART 216.0* 09/14/2014 0403   HCO3 21.2 09/14/2014 0403   TCO2 22.3 09/14/2014 0403   ACIDBASEDEF 2.9* 09/14/2014 0403   O2SAT 73.4 09/14/2014 0411     Radiology: Dg Chest Portable 1 View  09/13/2014   CLINICAL DATA:  74 year old male status post CABG. Initial encounter.  EXAM: PORTABLE CHEST - 1 VIEW  COMPARISON:  09/13/2014 preoperative study.  FINDINGS: Portable AP supine view at 1255 hours. Intubated, endotracheal tube tip at the level the clavicles. Enteric tube courses to the left upper quadrant, tip not included. Mediastinal drain and bilateral chest  tubes in place. No pneumothorax identified. Right IJ approach Swan-Ganz catheter, tip at the level of the main pulmonary artery. Questionable intra aortic catheter or balloon pump, versus overlying external wires.  Sequelae of CABG.  Overall stable mediastinal contours.  Mild pulmonary vascular congestion. No consolidation. Mild atelectasis along the minor fissure.  IMPRESSION: 1. Lines and tubes appear appropriately placed as above. No pneumothorax. 2. Mild pulmonary vascular congestion.   Electronically Signed   By: HRexene Edison  Margo Aye M.D.   On: 09/13/2014 13:21   Dg Chest Portable 1 View  09/13/2014   CLINICAL DATA:  Chest pain and shortness of breath  EXAM: PORTABLE CHEST - 1 VIEW  COMPARISON:  None.  FINDINGS: Hyperinflation and emphysematous change. There is no edema, consolidation, effusion, or pneumothorax. There is a 6 mm nodular density projecting over the right base. Normal heart size and aortic contours.  IMPRESSION: 1. COPD without acute superimposed disease. 2. Probable nipple shadow on the right. When clinically feasible, recommend two view chest with nipple markers.   Electronically Signed   By: Marnee Spring M.D.   On: 09/13/2014 04:17     Assessment/Plan: S/P Procedure(s) (LRB): CORONARY ARTERY BYPASS GRAFTING (CABG), ON PUMP, TIMES THREE, USING LEFT INTERNAL MAMMARY ARTERY, LEFT GREATER SAPHENOUS VEIN HARVESTED ENDOSCOPICALLY (N/A) PATENT FORAMEN OVALE CLOSURE  Cont LVAD support for at least 72hrs but wait to start heparin with severe postop coagulopathy With increasing O2 requirement and severe COPD will ask for CCM consult Situation d/w daughter Burna Mortimer [phone]   GOLD,WAYNE E 09/14/2014 7:36 AM

## 2014-09-14 NOTE — Op Note (Signed)
[name], [name] NO.:  [id number]  MEDICAL RECORD NO.:  [id number]  LOCATION:  2S04C                        FACILITY:  [facility]  PHYSICIAN:  Kerin Perna, M.D.  DATE OF BIRTH:  [birth year]  DATE OF PROCEDURE:  09/13/2014 DATE OF DISCHARGE:                              OPERATIVE REPORT   OPERATION: 1. Emergency coronary artery bypass grafting x3 (left internal mammary     artery to LAD, saphenous vein graft to ramus intermediate,     saphenous vein graft to obtuse marginal). 2. Endoscopic harvest of right leg greater saphenous vein.  SURGEON:  Kerin Perna MD.  ASSISTANT:  Rowe Clack, PA-C  PREOPERATIVE DIAGNOSES: 1. Acute myocardial infarction. 2. Cardiogenic shock with severe LV dysfunction. 3. Preoperative percutaneous LVAD Impella CP.  POSTOPERATIVE DIAGNOSES: 1. Acute myocardial infarction. 2. Cardiogenic shock with severe LV dysfunction. 3. Preoperative percutaneous LVAD Impella CP.  ANESTHESIA:  General by Dr. Claybon Jabs.  INDICATIONS:  The patient is a 74 year old Caucasian male, heavy smoker with COPD who presented to the emergency department after several hours of chest and epigastric pain which worsened associated with shortness of breath.  On admission, he was hypotensive and had positive cardiac enzymes with a troponin of 3.2.  Emergency cardiac catheterization for ST elevation MI had demonstrated a dominant right coronary with minimal disease.  His left main had a total occlusion which was opened with PTCA and wires.  A ventriculogram was not performed.  The cath was performed via right radial artery and Impella percutaneous LVAD was placed via right femoral artery into the ventricle through the aortic valve to provide adequate cardiac output.  The patient had been on Levophed and high-dose pressors until the Impella had been placed.  I evaluated the patient in the cardiac catheterization lab with the patient's cardiologist  and agreed with the recommendation for emergency coronary artery bypass grafting.  The patient was initially responsive and breathing spontaneously, and I discussed the procedure with the patient including the risks, benefits, alternatives, and he agreed to proceed with surgery.  Over the course of preparation for surgery, his pulmonary status worsened and he required intubation in the cardiac cath lab.  I did discuss the procedure with the patient's family who were in the waiting room and reviewed the same information regarding the details of surgery, the alternatives to surgery, the risks involved, and the high risk nature of this operation with a 30% risk of death or major morbidity.  DESCRIPTION OF PROCEDURE:  The patient was brought directly to the operating room and placed supine on the operating room table.  General anesthesia was induced.  Invasive monitoring lines were placed and a transesophageal echo probe was placed.  This demonstrated akinesia of the anterior wall.  The inferior wall had adequate function.  RV function was adequate.  There was no significant MR.  The patient was prepped and draped as a sterile field.  A proper time- out was performed.  A sternal incision was made as the saphenous vein was harvested endoscopically.  The patient had received bivalirudin and heparin in the cath lab and was fully anticoagulated.  The mammary artery was quickly harvested  as a pedicle graft and then the sternal retractor was placed.  The pericardium was opened and pursestrings were placed in the ascending aorta and right atrium.  The patient was then cannulated for bypass and the ACT was documented as being therapeutic greater than 500 seconds.  The vein was harvested.  The coronary arteries were identified for grafting.  The LAD was diffusely diseased and the anastomosis was placed distally.  It was chronically occluded proximally.  The ramus intermediate branch was the largest  vessel and this was in the myocardial.  The circumflex marginal was a small, but graftable vessel.  The circumflex marginal was a 1.2-mm in diameter.  A left ventricular vent was placed via the right superior pulmonary vein and cardioplegic cannulas were placed for both antegrade and retrograde cold blood cardioplegia.  The patient was cooled to 32 degrees.  The aortic crossclamp was applied with the Impella catheter in place. Cardioplegia was administered through the antegrade aortic and retrograde coronary sinus catheters and a good cardioplegic arrest. Septal temperature dropped less than 14 degrees.  Cardioplegia was delivered every 20 minutes.  It should be noted that the patient has severe emphysema and both lungs cover the anterior surface of the heart.  Numerous blebs were noted in both lungs.  The first distal anastomosis was to the circumflex marginal.  This was a 1.2-mm vessel at a proximal 95% stenosis.  A reverse saphenous vein was sewn end-to-side with running 7-0 Prolene with good flow through the graft.  Cardioplegia was redosed.  The second distal anastomosis was the ramus intermediate branch of the left coronary.  It had a 95% proximal stenosis.  It was intramyocardial. A reverse saphenous vein was sewn end-to-side with running 7-0 Prolene with good flow through the graft.  Cardioplegia was redosed.  The third distal anastomosis was to the distal LAD.  The left IMA pedicle was brought through an opening in the left lateral pericardium, was brought down onto the LAD and sewn end-to-side with running 8-0 Prolene.  There was good flow through the anastomosis after briefly releasing the pedicle bulldog on the mammary artery.  The bulldog was reapplied and the pedicle was secured with 6-0 Prolene's.  Cardioplegia was redosed.  While the crossclamp was still in place, 2 proximal vein anastomoses were placed on the ascending aorta.  The ascending aorta was  very thickened and had plaque in the wall.  A 4.5 mm punch and running 6-0 Prolene was used to establish the proximal anastomoses.  Prior to removing the crossclamp, air was evacuated from the coronaries with a dose of retrograde warm blood cardioplegia.  The crossclamp was removed.  The heart was cardioverted back to a regular rhythm.  The vein grafts were de-aired in open, each had good flow.  Hemostasis was documented at the proximal and distal sites.  The patient was rewarmed and reperfused.  The right atrium was very friable and the cannulation sutures tore easily.  For that reason, a right atriotomy was not performed to close a PFO which had been noted by the preoperative screening transesophageal echocardiogram.  The patient was rewarmed and reperfused.  Temporary pacing wires were applied.  It was evident the patient had significant coagulopathy and platelets and FFP, as well as cryoprecipitate were ordered as the patient's platelet count was only 40,000 and the fibrogen level was only 140.  The patient was started on low-to-moderate dose of pressors and the Impella catheter was turned to full speed as we weaned the  patient off cardiopulmonary bypass and filled the heart with volume.  The patient came off bypass on the first attempt.  Echo showed improvement in global LV function.  Protamine was administered without adverse reaction. There was good significant coagulopathy and the patient was given blood products including platelets, FFP, and cryoprecipitate with improved coagulation function.  The superior pericardial fat was closed over the aorta.  Bilateral pleural tubes in the anterior and posterior mediastinal chest tubes were placed and brought through separate incisions.  These were secured to the skin.  The sternum was closed with interrupted steel wire.  The chest was closed carefully.  Hemodynamics remained adequate.  The pectoralis fascia was closed in running  #1 Vicryl.  The subcutaneous and skin layers were closed in running Vicryl. Sterile dressings were applied.  Total cardiopulmonary bypass time was 130 minutes.     [name], M.D.     PV/MEDQ  D:  09/13/2014  T:  09/14/2014  Job:  [id number]

## 2014-09-14 NOTE — Progress Notes (Signed)
INITIAL NUTRITION ASSESSMENT  DOCUMENTATION CODES Per approved criteria  -Not Applicable   INTERVENTION:  If TF started recommend, Vital AF 1.2 formula -- initiate at 15 ml/hr and increase by 10 ml every 4 hours to goal rate of 65 ml/hr to provide 1872 kcals, 117 gm protein, 1259 ml of free water RD to follow for nutrition care plan  NUTRITION DIAGNOSIS: Inadequate oral intake related to inability to eat as evidenced by NPO status  Goal: Pt to meet >/= 90% of their estimated nutrition needs   Monitor:  TF initiation & tolerance, respiratory status, weight, labs, I/O's  Reason for Assessment: VDRF  74 y.o. male  Admitting Dx: chest pain  ASSESSMENT: 74 y/o Male with h/o HTN, HLD and tobacco use presents with epigastric pain that began about 11 pm ad has been continuous. This was associated with SOB and nausea. Symptoms initially were intermittent.He has been getting tired easily and has smoked a lot for a long time. In the ED, ECG revealed SR with ST elevation in aVR, aVL, V1-2 with diffuse deep ST depressions inferior and anterolateral leads.  Patient s/p procedures 4/13: CORONARY ARTERY BYPASS GRAFTING (CABG), ON PUMP, TIMES THREE, USING LEFT INTERNAL MAMMARY ARTERY, LEFT GREATER SAPHENOUS VEIN HARVESTED ENDOSCOPICALLY  PATENT FORAMEN OVALE CLOSURE  Patient is currently intubated on ventilator support -- OGT in place MV: 12.7 L/min Temp (24hrs), Avg:97.5 F (36.4 C), Min:93.9 F (34.4 C), Max:100.2 F (37.9 C)   RD unable to obtain nutrition hx at this time.  No nutrition problems identified PTA.  Nutrition focused physical exam completed.  No muscle or subcutaneous fat depletion noticed.  Height: Ht Readings from Last 1 Encounters:  09/13/14  (1.702 m)    Weight: Wt Readings from Last 1 Encounters:  09/14/14 161 lb 13.1 oz (73.4 kg)    Ideal Body Weight: 148 lb  % Ideal Body Weight: 108%  Wt Readings from Last 10 Encounters:  09/14/14 161 lb  13.1 oz (73.4 kg)  05/18/12 130 lb (58.968 kg)    Usual Body Weight: unable to obtain  % Usual Body Weight: ---  BMI:  Body mass index is 25.34 kg/(m^2).  Estimated Nutritional Needs: Kcal: 1900-2150 Protein: 110-120 gm Fluid: per MD  Skin: surgical chest, leg incisions   Diet Order: Diet NPO time specified Except for: Sips with Meds  EDUCATION NEEDS: -No education needs identified at this time   Intake/Output Summary (Last 24 hours) at 09/14/14 1149 Last data filed at 09/14/14 1100  Gross per 24 hour  Intake 16296.19 ml  Output   9360 ml  Net 6936.19 ml   Labs:   Recent Labs Lab 09/13/14 0357  09/13/14 1400  09/13/14 1859 09/13/14 2352 09/14/14 0020 09/14/14 0027 09/14/14 0322  NA 134*  < > 138  < >  --  135  --  158* 137  K 4.1  < > 3.1*  < >  --  3.9  --  3.2* 3.2*  CL 100  < > 108  < >  --  102  --  88* 103  CO2 25  --  23  --   --   --   --   --  20  BUN 11  < > 9  < >  --  13  --  10 11  CREATININE 1.11  < > 0.90  < > 1.04 1.10  --  1.10 1.24  CALCIUM 8.7  --  7.5*  --   --   --   --   --  8.8  MG  --   --   --   --  2.1  --  1.9  --  1.9  GLUCOSE 130*  < > 265*  < >  --  136*  --  186* 188*  < > = values in this interval not displayed.  CBG (last 3)   Recent Labs  09/14/14 0403 09/14/14 0458 09/14/14 0605  GLUCAP 170* 138* 57*    Scheduled Meds: . sodium chloride   Intravenous Once  . sodium chloride   Intravenous Once  . sodium chloride   Intravenous Once  . acetaminophen  1,000 mg Oral 4 times per day   Or  . acetaminophen (TYLENOL) oral liquid 160 mg/5 mL  1,000 mg Per Tube 4 times per day  . antiseptic oral rinse  7 mL Mouth Rinse QID  . aspirin EC  325 mg Oral Daily   Or  . aspirin  324 mg Per Tube Daily  . bisacodyl  10 mg Oral Daily   Or  . bisacodyl  10 mg Rectal Daily  . budesonide (PULMICORT) nebulizer solution  0.25 mg Nebulization BID  . chlorhexidine  15 mL Mouth Rinse BID  . docusate sodium  200 mg Oral Daily  .  escitalopram  15 mg Oral Daily  . insulin regular  0-10 Units Intravenous TID WC  . levalbuterol  1.25 mg Nebulization Q6H  . levofloxacin (LEVAQUIN) IV  750 mg Intravenous Q24H  . metoprolol tartrate  12.5 mg Oral BID   Or  . metoprolol tartrate  12.5 mg Per Tube BID  . pantoprazole (PROTONIX) IV  40 mg Intravenous Q24H  . simvastatin  20 mg Oral QPM  . sodium chloride  3 mL Intravenous Q12H  . sodium chloride  3 mL Intravenous Q12H  . tiotropium  18 mcg Inhalation Daily  . vancomycin  1,000 mg Intravenous Q12H    Continuous Infusions: . sodium chloride 20 mL/hr at 09/14/14 0600  . sodium chloride 250 mL (09/14/14 0611)  . sodium chloride 20 mL/hr (09/13/14 1315)  . amiodarone 30 mg/hr (09/14/14 0800)  . dexmedetomidine 0.7 mcg/kg/hr (09/14/14 0957)  . DOPamine 5 mcg/kg/min (09/14/14 0800)  . EPINEPHrine 4 mg in dextrose 5% 250 mL infusion (16 mcg/mL) 20 mcg/min (09/14/14 0924)  . [START ON 09/15/2014] impella catheter heparin 50 unit/mL in dextrose 5%    . [START ON 09/15/2014] heparin    . insulin (NOVOLIN-R) infusion 28.6 Units/hr (09/14/14 0956)  . lactated ringers 20 mL/hr at 09/13/14 1315  . lactated ringers 20 mL/hr (09/14/14 0825)  . lidocaine 1 mg/min (09/14/14 0800)  . milrinone 0.2 mcg/kg/min (09/14/14 0800)  . nitroGLYCERIN Stopped (09/13/14 1315)  . norepinephrine (LEVOPHED) Adult infusion 30 mcg/min (09/14/14 1100)  . phenylephrine (NEO-SYNEPHRINE) Adult infusion Stopped (09/14/14 0245)    Past Medical History  Diagnosis Date  . Dyspnea   . Palpitations   . Anxiety and depression   . GERD (gastroesophageal reflux disease)   . Anxiety   . Depression   . Hypertension   . Hypercholesteremia     Past Surgical History  Procedure Laterality Date  . Appendectomy    . Cataract extraction w/phaco  05/06/2012    Procedure: CATARACT EXTRACTION PHACO AND INTRAOCULAR LENS PLACEMENT (IOC);  Surgeon: Gemma Payor, MD;  Location: [facility];  Service: Ophthalmology;   Laterality: Right;  CDE:22.64  . Cataract extraction w/phaco  05/24/2012    Procedure: CATARACT EXTRACTION PHACO AND INTRAOCULAR LENS PLACEMENT (IOC);  Surgeon: Gemma Payor,  MD;  Location: [facility];  Service: Ophthalmology;  Laterality: Left;  CDE: 27.08  . Cardiac catheterization  09/13/2014    Procedure: VENTRICULAR ASSIST DEVICE INSERTION;  Surgeon: Corky CraftsJayadeep S Varanasi, MD;  [facility];  Service: Cardiovascular;;  . Cardiac catheterization  09/13/2014    Procedure: CORONARY BALLOON ANGIOPLASTY;  Surgeon: Corky CraftsJayadeep S Varanasi, MD;  [facility];  Service: Cardiovascular;;  . Coronary angiogram  09/13/2014    Procedure: CORONARY ANGIOGRAM;  Surgeon: Corky CraftsJayadeep S Varanasi, MD;  [facility];  Service: Cardiovascular;;    Maureen ChattersKatie Mataeo Ingwersen, RD, LDN Pager #: [phone] After-Hours Pager #: [phone]

## 2014-09-14 NOTE — Progress Notes (Signed)
  Patient Name: [name]   MRN: [id number]   Date of Birth/ Sex: July 02, 1940 , male      Admission Date: 09/13/2014  Attending Provider: [name], MD  Primary Diagnosis: <principal problem not specified>   Indication: Pt was in his usual state of health until this AM, when he was noted to be pulseless. Code blue was subsequently called. At the time of arrival on scene, ACLS protocol was underway.   Technical Description:  - CPR performance duration:  8 minutes  - Was defibrillation or cardioversion used? Yes   - Was external pacer placed? No  - Was patient intubated pre/post CPR? Yes   Medications Administered: Y = Yes; Blank = No Amiodarone  y  Atropine  n  Calcium  n  Epinephrine  y  Lidocaine  n  Magnesium  n  Norepinephrine  n  Phenylephrine  n  Sodium bicarbonate  y  Vasopressin  n   Post CPR evaluation:  - Final Status - Was patient successfully resuscitated ? Yes - What is current rhythm? NSR - What is current hemodynamic status? Stable  Miscellaneous Information:  - Labs sent, including: CBC,, troponinm chem 8, arterial blood gas, lactate  - Primary team notified?  Yes  - Family Notified? Yes  - Additional notes/ transfer status:      Annett Gularacy N McLean, MD  09/14/2014, 12:33 AM

## 2014-09-14 NOTE — Progress Notes (Signed)
Pt acutely developed SQ air in upper chest and abdomen with onset of acute drop in BP. MD Molli KnockYacoub on floor and came to bedside. STAT PCXR ordered.   Will monitor  [name], [name]

## 2014-09-14 NOTE — Consult Note (Signed)
PULMONARY / CRITICAL CARE MEDICINE   Name: [name] MRN: [id number] DOB: [birth year]    ADMISSION DATE:  09/13/2014 CONSULTATION DATE:  09/14/2014  REFERRING MD :  Donata Clay  CHIEF COMPLAINT:  Epigastric pain  INITIAL PRESENTATION:  74 y.o. M brought to [facility] 4/13 with epigastric pain.  Found to have acute anterolateral MI.  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  Following day 4/13, developed worsening shock so PCCM consulted for assistance with medical management.   STUDIES:  EKG 4/13 >>>  ST elevation in aVR, aVL, V1-2 with diffuse deep ST depressions inferior and anterolateral leads PCI 4/13 >>> occluded left main which was treated with balloon angioplasty with restoration of TIMI 3 flow into the circumflex system, occluded LAD and it's branches also treated with balloon angioplasty but did not have any significant flow return into the proximal mid LAD, widely patent left circ and it's branches, widely patent RCA with right to left collaterals feeding particularly the distal LAD, likely severe LV systolic dysfunction, LVEDP 38, cardiogenic shock with successful placement of Impella. Echo 4/13 >>> CXR 4/14 >>> slightly increased vascular congestion with new right and mid lower lung infiltrate.  SIGNIFICANT EVENTS: 4/13 - PCI then to OR for emergent CABG (CABG x 3, LIMA - LAD, SVG - OM, SVG - Ramus, PFO closure, EVH with left greater saphenous vein).  Suffered cardiac arrest later that night likely from tamponade due to occlude chest tubes.  No inherent LV ejection with full LVAD support at 3L / min.  Complete asystole under the pacer - av paced with good thresholds.  Had persistent coagulopathy, given one dose of novosen. 4/14 - PCCM consulted   HISTORY OF PRESENT ILLNESS:  Pt is encephalopathic; therefore, this HPI is obtained from chart review. [name] is a 74 y.o. M with  PMH as outlined below.  He presented to [facility] ED during early AM hours 4/13 with epigastric pain that began the night prior and was associated with SOB and nausea.  In ED, EKG revealed acute anterolateral MI (ST elevation in aVR, aVL, V1-2 with diffuse deep ST depressions inferior and anterolateral leads). He was taken for emergent PCI which revealed severe multivessel disease.  Due to his multivessel disease, he was taken to the OR for emergent CABG that same morning.  He had CABG x 3 with PFO closure.  Later that night, he suffered a cardiac arrest likely from tamponade due to an occluded chest tube.  He had no inherent LV ejection with full LVAD support at 3L / min.  Complete asystole under the pacer - av paced with good thresholds.  Had persistent coagulopathy, given one dose of novosen.  On 4/14, PCCM was consulted due to labile hemodynamics despite support via multiple vasopressors.  Impella BP and arterial line BP correlating with mean in high 50's (currently on levophed, epinephrine, dopamine, 0.72mcg milrinone).  In addition, RN noted new crepitus on exam on right and left chest, extending into upper abdomen.   US probe immediately placed on bilateral chest walls due to concern for pneumothorax.  Sliding lung seen bilaterally as well as [place] sign on M-mode.  STAT CXR also obtained and did not show any pneumothorax.  PAST MEDICAL HISTORY :   has a past medical history of Dyspnea; Palpitations; Anxiety and depression; GERD (gastroesophageal reflux disease); Anxiety; Depression;  Hypertension; and Hypercholesteremia.  has past surgical history that includes Appendectomy; Cataract extraction w/PHACO (05/06/2012); Cataract extraction w/PHACO (05/24/2012); Cardiac catheterization (09/13/2014); Cardiac catheterization (09/13/2014); and coronary angiogram (09/13/2014). Prior to Admission medications   Medication Sig Start Date End Date Taking? Authorizing Provider  ALPRAZolam Prudy Feeler)  0.5 MG tablet Take 0.5 mg by mouth 3 (three) times daily as needed. Anxiety.   Yes Historical Provider, MD  escitalopram (LEXAPRO) 10 MG tablet Take 15 mg by mouth daily.   Yes Historical Provider, MD  pantoprazole (PROTONIX) 40 MG tablet Take 40 mg by mouth daily.   Yes Historical Provider, MD  potassium chloride SA (K-DUR,KLOR-CON) 20 MEQ tablet Take 20 mEq by mouth daily.   Yes Historical Provider, MD  propranolol ER (INDERAL LA) 120 MG 24 hr capsule Take 120 mg by mouth 2 (two) times daily.    Yes Historical Provider, MD  simvastatin (ZOCOR) 20 MG tablet Take 20 mg by mouth every evening.   Yes Historical Provider, MD  Tamsulosin HCl (FLOMAX) 0.4 MG CAPS Take 0.4 mg by mouth at bedtime.    Yes Historical Provider, MD  tiotropium (SPIRIVA) 18 MCG inhalation capsule Place 18 mcg into inhaler and inhale daily.   Yes Historical Provider, MD   Allergies  Allergen Reactions  . Penicillins Nausea And Vomiting    FAMILY HISTORY:  Family History  Problem Relation Age of Onset  . Coronary artery disease      SOCIAL HISTORY:  reports that he has been smoking Cigarettes.  He has a 40 pack-year smoking history. He does not have any smokeless tobacco history on file. He reports that he does not drink alcohol. His drug history is not on file.  REVIEW OF SYSTEMS:  Unable to obtain as pt is encephalopathic.  SUBJECTIVE:   VITAL SIGNS: Temp:  [93.9 F (34.4 C)-100.2 F (37.9 C)] 100 F (37.8 C) (04/14 1030) Pulse Rate:  [25-106] 25 (04/14 1030) Resp:  [10-32] 22 (04/14 1030) BP: (34-98)/(14-68) 66/54 mmHg (04/13 1900) SpO2:  [54 %-100 %] 90 % (04/14 1030) Arterial Line BP: (53-150)/(25-117) 62/57 mmHg (04/14 1030) FiO2 (%):  [50 %-100 %] 85 % (04/14 1000) Weight:  [73.4 kg (161 lb 13.1 oz)] 73.4 kg (161 lb 13.1 oz) (04/14 0500) HEMODYNAMICS: PAP: (13-66)/(7-50) 30/20 mmHg CVP:  [9 mmHg-46 mmHg] 19 mmHg CO:  [2.4 L/min-4.4 L/min] 2.4 L/min CI:  [1.4 L/min/m2-2.6 L/min/m2] 1.4  L/min/m2 VENTILATOR SETTINGS: Vent Mode:  [-] PRVC FiO2 (%):  [50 %-100 %] 85 % Set Rate:  [12 bmp-22 bmp] 22 bmp Vt Set:  [600 mL] 600 mL PEEP:  [5 cmH20-8 cmH20] 8 cmH20 Pressure Support:  [10 cmH20] 10 cmH20 Plateau Pressure:  [12 cmH20-32 cmH20] 32 cmH20 INTAKE / OUTPUT: Intake/Output      04/13 0701 - 04/14 0700 04/14 0701 - 04/15 0700   I.V. (mL/kg) 8111.9 (110.5) 569.7 (7.8)   Blood 7576    Other 213.8 42.6   NG/GT 30 60   IV Piggyback 3350 150   Total Intake(mL/kg) 19281.7 (262.7) 822.3 (11.2)   Urine (mL/kg/hr) 5195 (2.9) 90 (0.3)   Emesis/NG output 500 (0.3) 100 (0.3)   Other 0 (0)    Blood 2400 (1.4)    Chest Tube 3950 (2.2) 50 (0.2)   Total Output 96045 240   Net +7236.7 +582.3          PHYSICAL EXAMINATION: General: Critically ill appearing. Neuro: Sedated, does not follow commands. HEENT: [place]/AT. PERRL, sclerae anicteric. Cardiovascular: RRR, no M/R/G. Crepitus in  bilateral chest walls extending inferiorly into abdomen. Lungs: Respirations shallow and labored on full vent support. Abdomen: BS x 4, soft, NT/ND.  Musculoskeletal: No gross deformities, no edema.  Skin: Intact, warm, no rashes.  LABS:  CBC  Recent Labs Lab 09/14/14 0020 09/14/14 0027 09/14/14 0322 09/14/14 0730  WBC 8.5  --  5.1 6.1  HGB 5.5* 4.4* 12.0* 10.8*  HCT 15.9* 13.0* 33.8* 29.9*  PLT 51*  --  112* 127*   Coag's  Recent Labs Lab 09/13/14 0357 09/13/14 1400 09/13/14 1859 09/14/14 0323  APTT 34 72* 83*  --   INR 1.05  --  2.48* 1.60*   BMET  Recent Labs Lab 09/13/14 0357  09/13/14 1400  09/13/14 2352 09/14/14 0027 09/14/14 0322  NA 134*  < > 138  < > 135 158* 137  K 4.1  < > 3.1*  < > 3.9 3.2* 3.2*  CL 100  < > 108  < > 102 88* 103  CO2 25  --  23  --   --   --  20  BUN 11  < > 9  < > 13 10 11   CREATININE 1.11  < > 0.90  < > 1.10 1.10 1.24  GLUCOSE 130*  < > 265*  < > 136* 186* 188*  < > = values in this interval not displayed. Electrolytes  Recent  Labs Lab 09/13/14 0357 09/13/14 1400 09/13/14 1859 09/14/14 0020 09/14/14 0322  CALCIUM 8.7 7.5*  --   --  8.8  MG  --   --  2.1 1.9 1.9   Sepsis Markers  Recent Labs Lab 09/14/14 0025 09/14/14 0325  LATICACIDVEN 7.4* 5.1*   ABG  Recent Labs Lab 09/14/14 0038 09/14/14 0202 09/14/14 0403  PHART 7.315* 7.275* 7.393  PCO2ART 49.8* 43.8 35.5  PO2ART 280.0* 275.0* 216.0*   Liver Enzymes  Recent Labs Lab 09/13/14 1400  AST 257*  ALT 32  ALKPHOS 23*  BILITOT 1.5*  ALBUMIN 2.1*   Cardiac Enzymes  Recent Labs Lab 09/13/14 1400 09/13/14 1859 09/14/14 0020  TROPONINI >80.00* >80.00* >80.00*   Glucose  Recent Labs Lab 09/13/14 2204 09/13/14 2258 09/14/14 0241 09/14/14 0403 09/14/14 0458 09/14/14 0605  GLUCAP 96 121* 182* 170* 138* 57*    Imaging Dg Chest Portable 1 View  09/13/2014   CLINICAL DATA:  74 year old male status post CABG. Initial encounter.  EXAM: PORTABLE CHEST - 1 VIEW  COMPARISON:  09/13/2014 preoperative study.  FINDINGS: Portable AP supine view at 1255 hours. Intubated, endotracheal tube tip at the level the clavicles. Enteric tube courses to the left upper quadrant, tip not included. Mediastinal drain and bilateral chest tubes in place. No pneumothorax identified. Right IJ approach Swan-Ganz catheter, tip at the level of the main pulmonary artery. Questionable intra aortic catheter or balloon pump, versus overlying external wires.  Sequelae of CABG.  Overall stable mediastinal contours.  Mild pulmonary vascular congestion. No consolidation. Mild atelectasis along the minor fissure.  IMPRESSION: 1. Lines and tubes appear appropriately placed as above. No pneumothorax. 2. Mild pulmonary vascular congestion.   Electronically Signed   By: Odessa FlemingH  Hall M.D.   On: 09/13/2014 13:21   Dg Chest Portable 1 View  09/13/2014   CLINICAL DATA:  Chest pain and shortness of breath  EXAM: PORTABLE CHEST - 1 VIEW  COMPARISON:  None.  FINDINGS: Hyperinflation and  emphysematous change. There is no edema, consolidation, effusion, or pneumothorax. There is a 6 mm nodular density projecting over the  right base. Normal heart size and aortic contours.  IMPRESSION: 1. COPD without acute superimposed disease. 2. Probable nipple shadow on the right. When clinically feasible, recommend two view chest with nipple markers.   Electronically Signed   By: Marnee Spring M.D.   On: 09/13/2014 04:17     ASSESSMENT / PLAN:  CARDIOVASCULAR Bilateral chest tubes and mediastinal chest tube 4/13 >>> A:  Cardiogenic shock following acute anterolateral MI S/p impella placement S/p CABG x 3 with closure of PFO 4/13 Zenaida Niece Trigt) Hx HTN, HLD P:  Continue vasopressors per TCTS. Goal MAP > 60. CVP's. Trend lactate. Hold outpatient propranolol.  PULMONARY OETT 4/13 >>> A: Acute hypoxic respiratory failure following CABG x 3 Hx COPD Tobacco use disorder P:   Full mechanical support, wean as able. VAP bundle. SBT once more hemodynamically stable. DuoNebs / Levalbuterol / Budesonide. ABG and CXR in AM.  RENAL A:   Hypokalemia P:   Potassium x 3 runs. BMP in AM.  GASTROINTESTINAL A:   GERD Nutrition P:   Famotidine. NPO.  HEMATOLOGIC A:   Anemia Thrombocytopenia Coagulopathy - received one dose novoseven P:  Transfuse for Hgb < 8. Monitor platelets. SCD's. CBC in AM.  INFECTIOUS A:   Surgical prophylaxis P:   Levaquin.  ENDOCRINE A:   Hyperglycemia P:   CBG's q4hr. SSI.  NEUROLOGIC A:   Acute metabolic encephalopathy Anxiety / Depression P:   Sedation:  Precedex gtt / Midazolam PRN / Morphine PRN. RASS goal: 0 to -1. Hold WUA until more hemodynamically stable. D/c outpatient escitalopram. Hold outpatient alprazolam.  Family updated: None.  Interdisciplinary Family Meeting v Palliative Care Meeting:  Due by: 4/20.  Rutherford Guys, PA - C  Pulmonary & Critical Care Medicine Pager: [phone]  or [phone]  [phone]  No heart beat noted on echo, BP maintained strictly artificially.  Multiple pressors for BP support.  Replace electrolytes as indicated.  Continue IVF.  Maintain on full vent support.  The patient is critically ill with multiple organ systems failure and requires high complexity decision making for assessment and support, frequent evaluation and titration of therapies, application of advanced monitoring technologies and extensive interpretation of multiple databases.   Critical Care Time devoted to patient care services described in this note is  35  Minutes. This time reflects time of care of this signee Dr Koren Bound. This critical care time does not reflect procedure time, or teaching time or supervisory time of PA/NP/Med student/Med Resident etc but could involve care discussion time.  Alyson Reedy, M.D. [facility] Pulmonary/Critical Care Medicine. Pager: [phone]. After hours pager: [phone].  09/14/2014, 11:06 AM

## 2014-09-15 ENCOUNTER — Inpatient Hospital Stay (HOSPITAL_COMMUNITY): Payer: Medicare Other

## 2014-09-15 LAB — PREPARE PLATELET PHERESIS
UNIT DIVISION: 0
Unit division: 0

## 2014-09-15 LAB — POCT ACTIVATED CLOTTING TIME
ACTIVATED CLOTTING TIME: 147 s
Activated Clotting Time: 140 seconds
Activated Clotting Time: 140 seconds
Activated Clotting Time: 153 seconds
Activated Clotting Time: 147 seconds
Activated Clotting Time: 147 seconds
Activated Clotting Time: 134 seconds

## 2014-09-15 LAB — BLOOD GAS, ARTERIAL
Acid-base deficit: 2.3 mmol/L — ABNORMAL HIGH (ref 0.0–2.0)
Bicarbonate: 21.3 mEq/L (ref 20.0–24.0)
FIO2: 0.6 %
MECHVT: 600 mL
O2 Saturation: 98.6 %
PEEP: 8 cmH2O
Patient temperature: 98.6
RATE: 22 resp/min
TCO2: 22.2 mmol/L (ref 0–100)
pCO2 arterial: 32.2 mmHg — ABNORMAL LOW (ref 35.0–45.0)
pH, Arterial: 7.435 (ref 7.350–7.450)
pO2, Arterial: 140 mmHg — ABNORMAL HIGH (ref 80.0–100.0)

## 2014-09-15 LAB — PREPARE FRESH FROZEN PLASMA
UNIT DIVISION: 0
Unit division: 0

## 2014-09-15 LAB — CBC
HCT: 28.1 % — ABNORMAL LOW (ref 39.0–52.0)
HCT: 26.8 % — ABNORMAL LOW (ref 39.0–52.0)
HCT: 30 % — ABNORMAL LOW (ref 39.0–52.0)
Hemoglobin: 10.2 g/dL — ABNORMAL LOW (ref 13.0–17.0)
Hemoglobin: 9.7 g/dL — ABNORMAL LOW (ref 13.0–17.0)
Hemoglobin: 10.8 g/dL — ABNORMAL LOW (ref 13.0–17.0)
MCH: 29.9 pg (ref 26.0–34.0)
MCH: 29.8 pg (ref 26.0–34.0)
MCH: 29.8 pg (ref 26.0–34.0)
MCHC: 36.3 g/dL — ABNORMAL HIGH (ref 30.0–36.0)
MCHC: 36.2 g/dL — ABNORMAL HIGH (ref 30.0–36.0)
MCHC: 36 g/dL (ref 30.0–36.0)
MCV: 82.4 fL (ref 78.0–100.0)
MCV: 82.2 fL (ref 78.0–100.0)
MCV: 82.9 fL (ref 78.0–100.0)
Platelets: 78 10*3/uL — ABNORMAL LOW (ref 150–400)
Platelets: 60 10*3/uL — ABNORMAL LOW (ref 150–400)
Platelets: 101 10*3/uL — ABNORMAL LOW (ref 150–400)
RBC: 3.41 MIL/uL — ABNORMAL LOW (ref 4.22–5.81)
RBC: 3.26 MIL/uL — ABNORMAL LOW (ref 4.22–5.81)
RBC: 3.62 MIL/uL — ABNORMAL LOW (ref 4.22–5.81)
RDW: 15.9 % — ABNORMAL HIGH (ref 11.5–15.5)
RDW: 15.9 % — ABNORMAL HIGH (ref 11.5–15.5)
RDW: 15.7 % — ABNORMAL HIGH (ref 11.5–15.5)
WBC: 10.5 10*3/uL (ref 4.0–10.5)
WBC: 10.6 10*3/uL — ABNORMAL HIGH (ref 4.0–10.5)
WBC: 12.6 10*3/uL — ABNORMAL HIGH (ref 4.0–10.5)

## 2014-09-15 LAB — GLUCOSE, CAPILLARY
GLUCOSE-CAPILLARY: 107 mg/dL — AB (ref 70–99)
GLUCOSE-CAPILLARY: 109 mg/dL — AB (ref 70–99)
GLUCOSE-CAPILLARY: 114 mg/dL — AB (ref 70–99)
GLUCOSE-CAPILLARY: 114 mg/dL — AB (ref 70–99)
GLUCOSE-CAPILLARY: 115 mg/dL — AB (ref 70–99)
GLUCOSE-CAPILLARY: 118 mg/dL — AB (ref 70–99)
GLUCOSE-CAPILLARY: 119 mg/dL — AB (ref 70–99)
GLUCOSE-CAPILLARY: 119 mg/dL — AB (ref 70–99)
GLUCOSE-CAPILLARY: 121 mg/dL — AB (ref 70–99)
GLUCOSE-CAPILLARY: 121 mg/dL — AB (ref 70–99)
GLUCOSE-CAPILLARY: 125 mg/dL — AB (ref 70–99)
GLUCOSE-CAPILLARY: 133 mg/dL — AB (ref 70–99)
Glucose-Capillary: 129 mg/dL — ABNORMAL HIGH (ref 70–99)
Glucose-Capillary: 162 mg/dL — ABNORMAL HIGH (ref 70–99)
Glucose-Capillary: 153 mg/dL — ABNORMAL HIGH (ref 70–99)
Glucose-Capillary: 141 mg/dL — ABNORMAL HIGH (ref 70–99)
Glucose-Capillary: 135 mg/dL — ABNORMAL HIGH (ref 70–99)
Glucose-Capillary: 124 mg/dL — ABNORMAL HIGH (ref 70–99)
Glucose-Capillary: 118 mg/dL — ABNORMAL HIGH (ref 70–99)
Glucose-Capillary: 115 mg/dL — ABNORMAL HIGH (ref 70–99)
Glucose-Capillary: 120 mg/dL — ABNORMAL HIGH (ref 70–99)
Glucose-Capillary: 112 mg/dL — ABNORMAL HIGH (ref 70–99)
Glucose-Capillary: 131 mg/dL — ABNORMAL HIGH (ref 70–99)
Glucose-Capillary: 141 mg/dL — ABNORMAL HIGH (ref 70–99)
Glucose-Capillary: 130 mg/dL — ABNORMAL HIGH (ref 70–99)
Glucose-Capillary: 129 mg/dL — ABNORMAL HIGH (ref 70–99)
Glucose-Capillary: 132 mg/dL — ABNORMAL HIGH (ref 70–99)
Glucose-Capillary: 114 mg/dL — ABNORMAL HIGH (ref 70–99)
Glucose-Capillary: 102 mg/dL — ABNORMAL HIGH (ref 70–99)
Glucose-Capillary: 97 mg/dL (ref 70–99)
Glucose-Capillary: 101 mg/dL — ABNORMAL HIGH (ref 70–99)

## 2014-09-15 LAB — CARBOXYHEMOGLOBIN
Carboxyhemoglobin: 1.3 % (ref 0.5–1.5)
Methemoglobin: 1.4 % (ref 0.0–1.5)
O2 Saturation: 70.3 %
Total hemoglobin: 11.9 g/dL — ABNORMAL LOW (ref 13.5–18.0)

## 2014-09-15 LAB — PHOSPHORUS: Phosphorus: 2.9 mg/dL (ref 2.3–4.6)

## 2014-09-15 LAB — COMPREHENSIVE METABOLIC PANEL
ALT: 24 U/L (ref 0–53)
AST: 181 U/L — ABNORMAL HIGH (ref 0–37)
Albumin: 2.2 g/dL — ABNORMAL LOW (ref 3.5–5.2)
Alkaline Phosphatase: 36 U/L — ABNORMAL LOW (ref 39–117)
Anion gap: 8 (ref 5–15)
BUN: 17 mg/dL (ref 6–23)
CO2: 21 mmol/L (ref 19–32)
Calcium: 7.4 mg/dL — ABNORMAL LOW (ref 8.4–10.5)
Chloride: 100 mmol/L (ref 96–112)
Creatinine, Ser: 1.64 mg/dL — ABNORMAL HIGH (ref 0.50–1.35)
GFR calc Af Amer: 47 mL/min — ABNORMAL LOW (ref >90)
GFR calc non Af Amer: 40 mL/min — ABNORMAL LOW (ref >90)
Glucose, Bld: 121 mg/dL — ABNORMAL HIGH (ref 70–99)
Potassium: 3.5 mmol/L (ref 3.5–5.1)
Sodium: 129 mmol/L — ABNORMAL LOW (ref 135–145)
Total Bilirubin: 2.4 mg/dL — ABNORMAL HIGH (ref 0.3–1.2)
Total Protein: 3.7 g/dL — ABNORMAL LOW (ref 6.0–8.3)

## 2014-09-15 LAB — LACTIC ACID, PLASMA: LACTIC ACID, VENOUS: 3.5 mmol/L — AB (ref 0.5–2.0)

## 2014-09-15 LAB — PREPARE CRYOPRECIPITATE
Unit division: 0
Unit division: 0

## 2014-09-15 LAB — PREPARE RBC (CROSSMATCH)

## 2014-09-15 LAB — MAGNESIUM: MAGNESIUM: 1.6 mg/dL (ref 1.5–2.5)

## 2014-09-15 MED ORDER — POTASSIUM CHLORIDE 10 MEQ/50ML IV SOLN
10.0000 meq | INTRAVENOUS | Status: AC
  Administered 2014-09-15 (×3): 10 meq via INTRAVENOUS
  Filled 2014-09-15 (×3): qty 50

## 2014-09-15 MED ORDER — POTASSIUM CHLORIDE 10 MEQ/50ML IV SOLN
10.0000 meq | INTRAVENOUS | Status: AC
  Administered 2014-09-15 (×2): 10 meq via INTRAVENOUS
  Filled 2014-09-15 (×2): qty 50

## 2014-09-15 MED ORDER — SODIUM CHLORIDE 0.9 % IV SOLN
Freq: Once | INTRAVENOUS | Status: AC
  Administered 2014-09-15: 10:00:00 via INTRAVENOUS

## 2014-09-15 MED ORDER — SODIUM CHLORIDE 0.9 % IV SOLN
Freq: Once | INTRAVENOUS | Status: AC
  Administered 2014-09-15: 14:00:00 via INTRAVENOUS

## 2014-09-15 MED FILL — Calcium Chloride Inj 10%: INTRAVENOUS | Qty: 10 | Status: AC

## 2014-09-15 MED FILL — Sodium Chloride IV Soln 0.9%: INTRAVENOUS | Qty: 4000 | Status: AC

## 2014-09-15 MED FILL — Electrolyte-R (PH 7.4) Solution: INTRAVENOUS | Qty: 5000 | Status: AC

## 2014-09-15 MED FILL — Heparin Sodium (Porcine) Inj 1000 Unit/ML: INTRAMUSCULAR | Qty: 10 | Status: AC

## 2014-09-15 MED FILL — Mannitol IV Soln 20%: INTRAVENOUS | Qty: 500 | Status: AC

## 2014-09-15 MED FILL — Medication: Qty: 1 | Status: AC

## 2014-09-15 MED FILL — Sodium Bicarbonate IV Soln 8.4%: INTRAVENOUS | Qty: 50 | Status: AC

## 2014-09-15 MED FILL — Lidocaine HCl IV Inj 20 MG/ML: INTRAVENOUS | Qty: 10 | Status: AC

## 2014-09-15 NOTE — Progress Notes (Signed)
Echocardiogram Echocardiogram Transesophageal has been performed.  Dorothey BasemanReel, Eliah Marquard M 09/15/2014, 11:48 AM

## 2014-09-15 NOTE — Progress Notes (Signed)
2 Days Post-Op Procedure(s) (LRB): CORONARY ARTERY BYPASS GRAFTING (CABG), ON PUMP, TIMES THREE, USING LEFT INTERNAL MAMMARY ARTERY, LEFT GREATER SAPHENOUS VEIN HARVESTED ENDOSCOPICALLY (N/A) PATENT FORAMEN OVALE CLOSURE Subjective:  Intubated on vent. Nurse notes that he will arouse and move extremities but not really following commands. Still on Precedex.  Objective: Vital signs in last 24 hours: Temp:  [97.7 F (36.5 C)-100.2 F (37.9 C)] 97.7 F (36.5 C) (04/15 0729) Pulse Rate:  [25-102] 87 (04/15 0729) Cardiac Rhythm:  [-] Normal sinus rhythm (04/15 0645) Resp:  [0-29] 22 (04/15 0715) BP: (86)/(65) 86/65 mmHg (04/15 0729) SpO2:  [73 %-100 %] 100 % (04/15 0729) Arterial Line BP: (57-123)/(53-90) 87/67 mmHg (04/15 0715) FiO2 (%):  [40 %-85 %] 40 % (04/15 0729) Weight:  [75.1 kg (165 lb 9.1 oz)] 75.1 kg (165 lb 9.1 oz) (04/15 0500)  Hemodynamic parameters for last 24 hours: PAP: (28-47)/(15-27) 33/16 mmHg CVP:  [8 mmHg-18 mmHg] 11 mmHg CO:  [2.5 L/min-3.5 L/min] 3.4 L/min CI:  [1.5 L/min/m2-2 L/min/m2] 2 L/min/m2   On Milrinone 0.2, epi 20, levophed 20, dop 5, amio 30 Co-ox 70%  Intake/Output from previous day: 04/14 0701 - 04/15 0700 In: 5303.1 [I.V.:4434.4; NG/GT:90; IV Piggyback:450] Out: 2895 [Urine:1080; Emesis/NG output:550; Chest Tube:1265] Intake/Output this shift: Total I/O In: 2010.2 [I.V.:1960.2; IV Piggyback:50] Out: 180 [Urine:50; Chest Tube:130]  Heart: regular rate and rhythm, S1, S2 normal, no murmur, click, rub or gallop Lungs: clear to auscultation bilaterally Abdomen: soft, nontender, hypoactive BS Extremities: edema mild Wound: dressings dry  Lab Results:  Recent Labs  09/14/14 2115 09/15/14 0319  WBC 10.1 10.5  HGB 10.2* 10.2*  HCT 28.2* 28.1*  PLT 100* 78*   BMET:  Recent Labs  09/14/14 0322  09/14/14 1604 09/15/14 0319  NA 137  --  131* 129*  K 3.2*  --  3.8 3.5  CL 103  --  99 100  CO2 20  --   --  21  GLUCOSE 188*  --   124* 121*  BUN 11  --  18 17  CREATININE 1.24  < > 1.50* 1.64*  CALCIUM 8.8  --   --  7.4*  < > = values in this interval not displayed.  PT/INR:  Recent Labs  09/14/14 0323  LABPROT 19.2*  INR 1.60*   ABG    Component Value Date/Time   PHART 7.435 09/15/2014 0500   HCO3 21.3 09/15/2014 0500   TCO2 22.2 09/15/2014 0500   ACIDBASEDEF 2.3* 09/15/2014 0500   O2SAT 70.3 09/15/2014 0500   O2SAT 98.6 09/15/2014 0500   CBG (last 3)   Recent Labs  09/15/14 0458 09/15/14 0557 09/15/14 0643  GLUCAP 118* 115* 120*   CXR: bibasilar atelectasis, may be small effusions but he has pleural tubes in adequate position so I suspect it is mostly atelectasis. Heart shadow size ok. Mild pulm vasc congestion.  Assessment/Plan: S/P Procedure(s) (LRB): CORONARY ARTERY BYPASS GRAFTING (CABG), ON PUMP, TIMES THREE, USING LEFT INTERNAL MAMMARY ARTERY, LEFT GREATER SAPHENOUS VEIN HARVESTED ENDOSCOPICALLY (N/A) PATENT FORAMEN OVALE CLOSURE  CV: hemodynamics fairly stable with adequate CI and Co-ox 70 on current high dose inotrope and vasopressor support with Impella at 3.0. PAP and CVP on the low normal side. Will discuss getting a TEE today with Dr. Gala Romney to evaluate Impella position and ventricular function since he has not had this done since his cardiac arrest and CPR wed overnight. The Impella waveforms look ok. Transthoracic echo yesterday could see much due to surgery,  tubes, sub Q air.  Start heparin in the Impella purge today to get ACT 160. Chest tube output has been ok.  Resp: Vent dependent at this time due to critical illness.  Renal: Good urine output 50/hr with fairly stable creat of 1.64 on no diuretic.  GI: NPO. Likely will need tube feeds for a little while.  Thrombocytopenia: Will give a unit today. Check HIT for completeness.   LOS: 2 days    Alleen BorneBryan K Tayjah Lobdell 09/15/2014

## 2014-09-15 NOTE — CV Procedure (Signed)
    TRANSESOPHAGEAL ECHOCARDIOGRAM   NAME:  [name]   MRN: [id number] DOB:  16-Feb-1941   ADMIT DATE: 09/13/2014  INDICATIONS: Shock assess Impella position.   PROCEDURE:   Informed consent was obtained prior to the procedure. The risks, benefits and alternatives for the procedure were discussed with family who comprehended these risks.  Risks include, but are not limited to, cough, sore throat, vomiting, nausea, somnolence, esophageal and stomach trauma or perforation, bleeding, low blood pressure, aspiration, pneumonia, infection, trauma to the teeth and death.    The patient was sedated on the vent. After a procedural time-out, the transesophageal probe was inserted in the esophagus and stomach without difficulty and multiple views were obtained.    COMPLICATIONS:    There were no immediate complications.  FINDINGS:  LEFT VENTRICLE: EF = 25-30%.Akinesis on anterior, anteroseptal and anterolateral walls. Impella catheter properly positioned with inflow site about 3.5 cm below AoV.   RIGHT VENTRICLE: Normal size and function. Catheter in place.   LEFT ATRIUM: Dialted  LEFT ATRIAL APPENDAGE: Not visualized  RIGHT ATRIUM: Normal  AORTIC VALVE:  Trileaflet. Mild AI due to Impella catheter  MITRAL VALVE:    Normal. Trivial MR  TRICUSPID VALVE: Normal. Mild TR  PULMONIC VALVE: Not visualized  INTERATRIAL SEPTUM: No obvious PFO or ASD.  PERICARDIUM: No effusion  DESCENDING AORTA: Moderate plaque  Daniel Bensimhon,MD 11:33 AM

## 2014-09-15 NOTE — Consult Note (Signed)
PULMONARY / CRITICAL CARE MEDICINE   Name: [name] MRN: [id number] DOB: February 23, 1941    ADMISSION DATE:  09/13/2014 CONSULTATION DATE:  09/15/2014  REFERRING MD :  Donata Clay  CHIEF COMPLAINT:  Epigastric pain  INITIAL PRESENTATION:  74 y.o. M brought to [facility] 4/13 with epigastric pain.  Found to have acute anterolateral MI.  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  Following day 4/13, developed worsening shock so PCCM consulted for assistance with medical management.   STUDIES:  EKG 4/13 >>>  ST elevation in aVR, aVL, V1-2 with diffuse deep ST depressions inferior and anterolateral leads PCI 4/13 >>> occluded left main which was treated with balloon angioplasty with restoration of TIMI 3 flow into the circumflex system, occluded LAD and it's branches also treated with balloon angioplasty but did not have any significant flow return into the proximal mid LAD, widely patent left circ and it's branches, widely patent RCA with right to left collaterals feeding particularly the distal LAD, likely severe LV systolic dysfunction, LVEDP 38, cardiogenic shock with successful placement of Impella. Echo 4/13 >>> CXR 4/14 >>> slightly increased vascular congestion with new right and mid lower lung infiltrate.  SIGNIFICANT EVENTS: 4/13 - PCI then to OR for emergent CABG (CABG x 3, LIMA - LAD, SVG - OM, SVG - Ramus, PFO closure, EVH with left greater saphenous vein).  Suffered cardiac arrest later that night likely from tamponade due to occlude chest tubes.  No inherent LV ejection with full LVAD support at 3L / min.  Complete asystole under the pacer - av paced with good thresholds.  Had persistent coagulopathy, given one dose of novosen. 4/14 - PCCM consulted  SUBJECTIVE: No events overnight, unresponsive, has a heart beat today.  VITAL SIGNS: Temp:  [97.7 F (36.5 C)-100 F (37.8 C)] 98.1 F  (36.7 C) (04/15 1000) Pulse Rate:  [25-102] 87 (04/15 1000) Resp:  [0-29] 0 (04/15 1000) BP: (86)/(65) 86/65 mmHg (04/15 0729) SpO2:  [73 %-100 %] 100 % (04/15 1000) Arterial Line BP: (57-123)/(48-90) 81/62 mmHg (04/15 1000) FiO2 (%):  [40 %-85 %] 40 % (04/15 0828) Weight:  [75.1 kg (165 lb 9.1 oz)] 75.1 kg (165 lb 9.1 oz) (04/15 0500) HEMODYNAMICS: PAP: (28-47)/(14-27) 30/18 mmHg CVP:  [8 mmHg-19 mmHg] 12 mmHg CO:  [2.5 L/min-3.5 L/min] 3.3 L/min CI:  [1.5 L/min/m2-2 L/min/m2] 1.9 L/min/m2 VENTILATOR SETTINGS: Vent Mode:  [-] PRVC FiO2 (%):  [40 %-85 %] 40 % Set Rate:  [22 bmp] 22 bmp Vt Set:  [600 mL] 600 mL PEEP:  [8 cmH20] 8 cmH20 Plateau Pressure:  [21 cmH20-35 cmH20] 21 cmH20 INTAKE / OUTPUT: Intake/Output      04/14 0701 - 04/15 0700 04/15 0701 - 04/16 0700   I.V. (mL/kg) 4434.4 (59) 2476.8 (33)   Blood     Other 328.7 33.6   NG/GT 90    IV Piggyback 450 50   Total Intake(mL/kg) 5303.1 (70.6) 2560.4 (34.1)   Urine (mL/kg/hr) 1080 (0.6) 225 (0.9)   Emesis/NG output 550 (0.3)    Other     Blood     Chest Tube 1265 (0.7) 680 (2.7)   Total Output 2895 905   Net +2408.1 +1655.4         PHYSICAL EXAMINATION: General: Critically ill appearing. Neuro: Sedated, does not follow commands but woke up during TTE. HEENT: [place]/AT. PERRL, sclerae anicteric. Cardiovascular:  RRR, no M/R/G. Crepitus in bilateral chest walls extending inferiorly into abdomen. Lungs: Respirations shallow and labored on full vent support. Abdomen: BS x 4, soft, NT/ND.  Musculoskeletal: No gross deformities, no edema.  Skin: Intact, warm, no rashes.  LABS:  CBC  Recent Labs Lab 09/14/14 2115 09/15/14 0319 09/15/14 0910  WBC 10.1 10.5 10.6*  HGB 10.2* 10.2* 9.7*  HCT 28.2* 28.1* 26.8*  PLT 100* 78* 60*   Coag's  Recent Labs Lab 09/13/14 0357 09/13/14 1400 09/13/14 1859 09/14/14 0323  APTT 34 72* 83*  --   INR 1.05  --  2.48* 1.60*   BMET  Recent Labs Lab 09/13/14 1400   09/14/14 0322 09/14/14 1510 09/14/14 1604 09/15/14 0319  NA 138  < > 137  --  131* 129*  K 3.1*  < > 3.2*  --  3.8 3.5  CL 108  < > 103  --  99 100  CO2 23  --  20  --   --  21  BUN 9  < > 11  --  18 17  CREATININE 0.90  < > 1.24 1.76* 1.50* 1.64*  GLUCOSE 265*  < > 188*  --  124* 121*  < > = values in this interval not displayed. Electrolytes  Recent Labs Lab 09/13/14 1400  09/14/14 0322 09/14/14 1510 09/15/14 0319  CALCIUM 7.5*  --  8.8  --  7.4*  MG  --   < > 1.9 1.7 1.6  PHOS  --   --   --   --  2.9  < > = values in this interval not displayed. Sepsis Markers  Recent Labs Lab 09/14/14 1314 09/14/14 1600 09/14/14 2359  LATICACIDVEN 3.1* 3.1* 3.5*   ABG  Recent Labs Lab 09/14/14 0403 09/14/14 1558 09/15/14 0500  PHART 7.393 7.436 7.435  PCO2ART 35.5 36.8 32.2*  PO2ART 216.0* 253.0* 140.0*   Liver Enzymes  Recent Labs Lab 09/13/14 1400 09/15/14 0319  AST 257* 181*  ALT 32 24  ALKPHOS 23* 36*  BILITOT 1.5* 2.4*  ALBUMIN 2.1* 2.2*   Cardiac Enzymes  Recent Labs Lab 09/13/14 1400 09/13/14 1859 09/14/14 0020  TROPONINI >80.00* >80.00* >80.00*   Glucose  Recent Labs Lab 09/15/14 0200 09/15/14 0306 09/15/14 0359 09/15/14 0458 09/15/14 0557 09/15/14 0643  GLUCAP 118* 115* 119* 118* 115* 120*    Imaging Dg Chest Port 1 View  09/14/2014   CLINICAL DATA:  Coronary artery disease.  Status post CABG.  EXAM: PORTABLE CHEST - 1 VIEW  COMPARISON:  09/14/2014 and 09/13/2014  FINDINGS: Endotracheal tube, Swan-Ganz catheter, and chest tubes appear in good position, unchanged. Tip of the NG tube is at the gastroesophageal junction.  There are hazy bilateral infiltrates which may represent pulmonary edema. There small bilateral pleural effusions with slight atelectasis at the left lung base posterior medially.  Subcutaneous emphysema has slightly diminished since the prior exam.  IMPRESSION: Diminished subcutaneous emphysema. Increased haziness in the  lungs which probably represents slight pulmonary edema. Small bilateral effusions with left base atelectasis, unchanged.   Electronically Signed   By: Francene BoyersJames  Maxwell M.D.   On: 09/14/2014 11:30   Dg Chest Port 1 View  09/14/2014   CLINICAL DATA:  Postop CABG.  EXAM: PORTABLE CHEST - 1 VIEW  COMPARISON:  09/14/2014 at 0032 hours  FINDINGS: Sequelae of CABG are again identified. Endotracheal tube is unchanged, terminating at the level of the clavicular heads. Right jugular Swan-Ganz catheter is unchanged, projecting over the main pulmonary  artery. Impella device remains in place. Enteric tube courses into the left upper abdomen. Mediastinal drain and bilateral chest tubes remain. The cardiomediastinal silhouette is unchanged. No pleural effusion or pneumothorax is identified. Pulmonary vascular congestion is stable to slightly increased. Left perihilar opacities have mildly improved. There is worsening opacity in the right mid and lower lung. Bilateral subcutaneous chest wall emphysema is new.  IMPRESSION: 1. Unchanged support devices as above. 2. Stable to slightly increased vascular congestion with new right mid and lower lung infiltrate. Improved left perihilar opacities.   Electronically Signed   By: Sebastian Ache   On: 09/14/2014 08:07   Dg Chest Port 1 View  09/14/2014   CLINICAL DATA:  Post CPR.  EXAM: PORTABLE CHEST - 1 VIEW  COMPARISON:  09/13/2014 at 12:55  FINDINGS: Left lung base is excluded from the field of view. Endotracheal tube at the thoracic inlet. Enteric tube in place, tip below the diaphragm not included. Tip of the right Swan-Ganz catheter in the pulmonary outflow tract. Chest tubes and mediastinal drains remain in place. Patient is post median sternotomy. Increasing left perihilar opacities. Increasing vascular congestion. No definite pneumothorax.  IMPRESSION: 1. Increasing left perihilar opacity, may reflect atelectasis or aspiration. Worsening vascular congestion. 2. Left lung base  excluded from the field of view. 3. Support apparatus appears unchanged.   Electronically Signed   By: Rubye Oaks M.D.   On: 09/14/2014 02:28   ASSESSMENT / PLAN:  CARDIOVASCULAR Bilateral chest tubes and mediastinal chest tube 4/13 >>> A:  Cardiogenic shock following acute anterolateral MI S/p impella placement S/p CABG x 3 with closure of PFO 4/13 Zenaida Niece Trigt) Hx HTN, HLD P:  Continue vasopressors Epi 20, norepi 24, dopa 5 and milrinone 2.5. Goal MAP > 60 but doubt will achieve. PCWP negligible. Trend lactate. Hold outpatient propranolol.  PULMONARY OETT 4/13 >>> A: Acute hypoxic respiratory failure following CABG x 3 Hx COPD Tobacco use disorder P:   Full mechanical support, hold weaning. VAP bundle. DuoNebs / Levalbuterol / Budesonide. ABG and CXR in AM.  RENAL A:   Hypokalemia P:   Potassium x 2 runs. BMP in AM.  GASTROINTESTINAL A:   GERD Nutrition P:   Famotidine. NPO for TEE today, will reevaluate for TF in AM but unable to put head up with cardiac assist device.  HEMATOLOGIC A:   Anemia Thrombocytopenia Coagulopathy - received one dose novoseven P:  Transfuse for Hgb < 8. Monitor platelets. SCD's. CBC in AM.  INFECTIOUS A:   Surgical prophylaxis P:   Levaquin.  ENDOCRINE A:   Hyperglycemia P:   CBG's q4hr. SSI.  NEUROLOGIC A:   Acute metabolic encephalopathy Anxiety / Depression P:   Sedation:  Precedex gtt / Midazolam PRN / Morphine PRN. RASS goal: 0 to -1. Hold WUA until more hemodynamically stable. D/c outpatient escitalopram. Hold outpatient alprazolam.  Family updated: None.  Interdisciplinary Family Meeting v Palliative Care Meeting:  Due by: 4/20.  The patient is critically ill with multiple organ systems failure and requires high complexity decision making for assessment and support, frequent evaluation and titration of therapies, application of advanced monitoring technologies and extensive interpretation of  multiple databases.   Critical Care Time devoted to patient care services described in this note is  35  Minutes. This time reflects time of care of this signee Dr Koren Bound. This critical care time does not reflect procedure time, or teaching time or supervisory time of PA/NP/Med student/Med Resident etc but could involve care  discussion time.  Alyson Reedy, M.D. [facility] Pulmonary/Critical Care Medicine. Pager: [phone]. After hours pager: [phone].  09/15/2014, 10:24 AM

## 2014-09-15 NOTE — Progress Notes (Addendum)
TCTS DAILY ICU PROGRESS NOTE                   [address]            [place] [address]          [phone]   2 Days Post-Op Procedure(s) (LRB): CORONARY ARTERY BYPASS GRAFTING (CABG), ON PUMP, TIMES THREE, USING LEFT INTERNAL MAMMARY ARTERY, LEFT GREATER SAPHENOUS VEIN HARVESTED ENDOSCOPICALLY (N/A) PATENT FORAMEN OVALE CLOSURE  Total Length of Stay:  LOS: 2 days   Subjective: Has regained some cardiac pulsatility, sedated on vent  Objective: Vital signs in last 24 hours: Temp:  [97.7 F (36.5 C)-100.2 F (37.9 C)] 97.7 F (36.5 C) (04/15 0729) Pulse Rate:  [25-102] 87 (04/15 0729) Cardiac Rhythm:  [-] Normal sinus rhythm (04/15 0645) Resp:  [0-29] 22 (04/15 0715) BP: (86)/(65) 86/65 mmHg (04/15 0729) SpO2:  [73 %-100 %] 100 % (04/15 0729) Arterial Line BP: (57-123)/(53-90) 87/67 mmHg (04/15 0715) FiO2 (%):  [40 %-85 %] 40 % (04/15 0729) Weight:  [165 lb 9.1 oz (75.1 kg)] 165 lb 9.1 oz (75.1 kg) (04/15 0500)  Filed Weights   09/13/14 0358 09/14/14 0500 09/15/14 0500  Weight: 135 lb (61.236 kg) 161 lb 13.1 oz (73.4 kg) 165 lb 9.1 oz (75.1 kg)    Weight change: 3 lb 12 oz (1.7 kg)   Hemodynamic parameters for last 24 hours: PAP: (27-47)/(15-27) 33/16 mmHg CVP:  [8 mmHg-19 mmHg] 11 mmHg CO:  [2.4 L/min-3.5 L/min] 3.4 L/min CI:  [1.4 L/min/m2-2 L/min/m2] 2 L/min/m2  Intake/Output from previous day: 04/14 0701 - 04/15 0700 In: 5303.1 [I.V.:4434.4; NG/GT:90; IV Piggyback:450] Out: 2895 [Urine:1080; Emesis/NG output:550; Chest Tube:1265]   Vent Mode:  [-] PRVC FiO2 (%):  [40 %-85 %] 40 % Set Rate:  [22 bmp] 22 bmp Vt Set:  [600 mL] 600 mL PEEP:  [8 cmH20] 8 cmH20 Plateau Pressure:  [27 cmH20-35 cmH20] 29 cmH20 Intake/Output this shift:    Current Meds: Scheduled Meds: . sodium chloride   Intravenous Once  . sodium chloride   Intravenous Once  . sodium chloride   Intravenous Once  . acetaminophen  1,000 mg Oral 4 times per day   Or  .  acetaminophen (TYLENOL) oral liquid 160 mg/5 mL  1,000 mg Per Tube 4 times per day  . antiseptic oral rinse  7 mL Mouth Rinse QID  . aspirin EC  325 mg Oral Daily   Or  . aspirin  324 mg Per Tube Daily  . bisacodyl  10 mg Oral Daily   Or  . bisacodyl  10 mg Rectal Daily  . budesonide (PULMICORT) nebulizer solution  0.5 mg Nebulization BID  . chlorhexidine  15 mL Mouth Rinse BID  . docusate sodium  200 mg Oral Daily  . insulin regular  0-10 Units Intravenous TID WC  . ipratropium-albuterol  3 mL Nebulization Q6H  . levofloxacin (LEVAQUIN) IV  750 mg Intravenous Q24H  . metoprolol tartrate  12.5 mg Oral BID   Or  . metoprolol tartrate  12.5 mg Per Tube BID  . pantoprazole (PROTONIX) IV  40 mg Intravenous Q24H  . potassium chloride  10 mEq Intravenous Q1 Hr x 3  . simvastatin  20 mg Oral QPM  . sodium chloride  3 mL Intravenous Q12H   Continuous Infusions: . sodium chloride 20 mL/hr at 09/14/14 0600  . sodium chloride 250 mL (09/14/14 0611)  . sodium chloride 20 mL/hr (09/13/14 1315)  .  amiodarone 30 mg/hr (09/15/14 0500)  . dexmedetomidine 0.7 mcg/kg/hr (09/14/14 2300)  . DOPamine 5 mcg/kg/min (09/15/14 0100)  . EPINEPHrine 4 mg in dextrose 5% 250 mL infusion (16 mcg/mL) 20 mcg/min (09/15/14 0345)  . impella catheter heparin 50 unit/mL in dextrose 5%    . heparin    . insulin (NOVOLIN-R) infusion 8.7 Units/hr (09/15/14 0600)  . lactated ringers 20 mL/hr at 09/13/14 1315  . lactated ringers 20 mL/hr (09/14/14 0825)  . lidocaine 1 mg/min (09/14/14 0800)  . milrinone 0.2 mcg/kg/min (09/15/14 0100)  . nitroGLYCERIN Stopped (09/13/14 1315)  . norepinephrine (LEVOPHED) Adult infusion 20 mcg/min (09/15/14 0530)  . phenylephrine (NEO-SYNEPHRINE) Adult infusion Stopped (09/14/14 0245)   PRN Meds:.sodium chloride, acetaminophen, albumin human, levalbuterol, metoprolol, midazolam, morphine injection, nitroGLYCERIN, ondansetron (ZOFRAN) IV, oxyCODONE, sodium chloride, traMADol  General  appearance: sedated on vent Neurologic: moves extrems at times,not following commands Heart: regular rate and rhythm Lungs: slightly coarse, sub q air a bit improved Abdomen: soft Extremities: no LE edema Wound: dressings intact some oozing + DP doppler signals , feet cool Lab Results: CBC: Recent Labs  09/14/14 2115 09/15/14 0319  WBC 10.1 10.5  HGB 10.2* 10.2*  HCT 28.2* 28.1*  PLT 100* 78*   BMET:  Recent Labs  09/14/14 0322  09/14/14 1604 09/15/14 0319  NA 137  --  131* 129*  K 3.2*  --  3.8 3.5  CL 103  --  99 100  CO2 20  --   --  21  GLUCOSE 188*  --  124* 121*  BUN 11  --  18 17  CREATININE 1.24  < > 1.50* 1.64*  CALCIUM 8.8  --   --  7.4*  < > = values in this interval not displayed.  PT/INR:  Recent Labs  09/14/14 0323  LABPROT 19.2*  INR 1.60*    ABG    Component Value Date/Time   PHART 7.435 09/15/2014 0500   PCO2ART 32.2* 09/15/2014 0500   PO2ART 140.0* 09/15/2014 0500   HCO3 21.3 09/15/2014 0500   TCO2 22.2 09/15/2014 0500   ACIDBASEDEF 2.3* 09/15/2014 0500   O2SAT 70.3 09/15/2014 0500   O2SAT 98.6 09/15/2014 0500    Radiology: Dg Chest Port 1 View  09/14/2014   CLINICAL DATA:  Coronary artery disease.  Status post CABG.  EXAM: PORTABLE CHEST - 1 VIEW  COMPARISON:  09/14/2014 and 09/13/2014  FINDINGS: Endotracheal tube, Swan-Ganz catheter, and chest tubes appear in good position, unchanged. Tip of the NG tube is at the gastroesophageal junction.  There are hazy bilateral infiltrates which may represent pulmonary edema. There small bilateral pleural effusions with slight atelectasis at the left lung base posterior medially.  Subcutaneous emphysema has slightly diminished since the prior exam.  IMPRESSION: Diminished subcutaneous emphysema. Increased haziness in the lungs which probably represents slight pulmonary edema. Small bilateral effusions with left base atelectasis, unchanged.   Electronically Signed   By: Francene Boyers M.D.   On:  09/14/2014 11:30   Dg Chest Port 1 View  09/14/2014   CLINICAL DATA:  Postop CABG.  EXAM: PORTABLE CHEST - 1 VIEW  COMPARISON:  09/14/2014 at 0032 hours  FINDINGS: Sequelae of CABG are again identified. Endotracheal tube is unchanged, terminating at the level of the clavicular heads. Right jugular Swan-Ganz catheter is unchanged, projecting over the main pulmonary artery. Impella device remains in place. Enteric tube courses into the left upper abdomen. Mediastinal drain and bilateral chest tubes remain. The cardiomediastinal silhouette is unchanged. No  pleural effusion or pneumothorax is identified. Pulmonary vascular congestion is stable to slightly increased. Left perihilar opacities have mildly improved. There is worsening opacity in the right mid and lower lung. Bilateral subcutaneous chest wall emphysema is new.  IMPRESSION: 1. Unchanged support devices as above. 2. Stable to slightly increased vascular congestion with new right mid and lower lung infiltrate. Improved left perihilar opacities.   Electronically Signed   By: Sebastian AcheAllen  Grady   On: 09/14/2014 08:07   Dg Chest Port 1 View  09/14/2014   CLINICAL DATA:  Post CPR.  EXAM: PORTABLE CHEST - 1 VIEW  COMPARISON:  09/13/2014 at 12:55  FINDINGS: Left lung base is excluded from the field of view. Endotracheal tube at the thoracic inlet. Enteric tube in place, tip below the diaphragm not included. Tip of the right Swan-Ganz catheter in the pulmonary outflow tract. Chest tubes and mediastinal drains remain in place. Patient is post median sternotomy. Increasing left perihilar opacities. Increasing vascular congestion. No definite pneumothorax.  IMPRESSION: 1. Increasing left perihilar opacity, may reflect atelectasis or aspiration. Worsening vascular congestion. 2. Left lung base excluded from the field of view. 3. Support apparatus appears unchanged.   Electronically Signed   By: Rubye OaksMelanie  Ehinger M.D.   On: 09/14/2014 02:28     Assessment/Plan: S/P  Procedure(s) (LRB): CORONARY ARTERY BYPASS GRAFTING (CABG), ON PUMP, TIMES THREE, USING LEFT INTERNAL MAMMARY ARTERY, LEFT GREATER SAPHENOUS VEIN HARVESTED ENDOSCOPICALLY (N/A) PATENT FORAMEN OVALE CLOSURE  1 remains critically ill on multiple pressors, Impella with good flows. Plan to start heparin today. INR is 1.6 2 VDRF- CCM assisting with management. Sub q air is improved, i dont see pneumothorax 3 platelets low, may need to transfuse with oozing - monitor for now 4 Creat is rising slowly- UOP is low, does not appear signif volume overloaded 5 H/H stable, no leukocytosis- CT drainage is slowing but had almost 4 liters yesterday- keep all in place 6 neuro status is uncertain  GOLD,WAYNE E 09/15/2014 7:30 AM  Patient seen and examined, agree with above He has a rhythm and some pulsatility  Ct output decreasing but remains relatively high Will start heparin via pump but hold off on systemic heparin due to thrombocytopenia and oozing  Viviann SpareSteven C. Dorris FetchHendrickson, MD [facility] [phone]

## 2014-09-15 NOTE — Progress Notes (Signed)
      [address]       [place] [id number]             [phone]      Intubated, sedated  Maintaining good flows with impella  BP 120/79 mmHg  Pulse 90  Temp(Src) 99 F (37.2 C) (Core (Comment))  Resp 0  Ht 5\' 7"  (1.702 m)  Wt 165 lb 9.1 oz (75.1 kg)  BMI 25.93 kg/m2  SpO2 99%   Intake/Output Summary (Last 24 hours) at 09/15/14 1843 Last data filed at 09/15/14 1800  Gross per 24 hour  Intake 6785.93 ml  Output   4408 ml  Net 2377.93 ml   HCt 30 and PLT 101 post transfusion  Continue current care  Almir Botts C. Dorris FetchHendrickson, MD [facility] [phone]

## 2014-09-15 NOTE — Progress Notes (Signed)
CRITICAL VALUE ALERT  Critical value received:  Lactic acid 3.5  Date of notification:  09/15/2014  Time of notification:  0038  Critical value read back:Yes.    Nurse who received alert:  Devona KonigAvery Daniel  MD not notified per previous instructions.

## 2014-09-16 ENCOUNTER — Inpatient Hospital Stay (HOSPITAL_COMMUNITY): Payer: Medicare Other

## 2014-09-16 ENCOUNTER — Encounter (HOSPITAL_COMMUNITY): Payer: Self-pay | Admitting: Cardiothoracic Surgery

## 2014-09-16 DIAGNOSIS — Z4659 Encounter for fitting and adjustment of other gastrointestinal appliance and device: Secondary | ICD-10-CM | POA: Insufficient documentation

## 2014-09-16 DIAGNOSIS — Z789 Other specified health status: Secondary | ICD-10-CM

## 2014-09-16 DIAGNOSIS — Z95811 Presence of heart assist device: Secondary | ICD-10-CM

## 2014-09-16 DIAGNOSIS — Z9289 Personal history of other medical treatment: Secondary | ICD-10-CM

## 2014-09-16 DIAGNOSIS — Z87898 Personal history of other specified conditions: Secondary | ICD-10-CM

## 2014-09-16 DIAGNOSIS — Z978 Presence of other specified devices: Secondary | ICD-10-CM | POA: Insufficient documentation

## 2014-09-16 LAB — COMPREHENSIVE METABOLIC PANEL
ALT: 23 U/L (ref 0–53)
AST: 148 U/L — ABNORMAL HIGH (ref 0–37)
Albumin: 1.9 g/dL — ABNORMAL LOW (ref 3.5–5.2)
Alkaline Phosphatase: 52 U/L (ref 39–117)
Anion gap: 8 (ref 5–15)
BUN: 13 mg/dL (ref 6–23)
CO2: 21 mmol/L (ref 19–32)
Calcium: 7.4 mg/dL — ABNORMAL LOW (ref 8.4–10.5)
Chloride: 99 mmol/L (ref 96–112)
Creatinine, Ser: 1.43 mg/dL — ABNORMAL HIGH (ref 0.50–1.35)
GFR calc Af Amer: 55 mL/min — ABNORMAL LOW (ref >90)
GFR calc non Af Amer: 48 mL/min — ABNORMAL LOW (ref >90)
Glucose, Bld: 100 mg/dL — ABNORMAL HIGH (ref 70–99)
Potassium: 4.5 mmol/L (ref 3.5–5.1)
Sodium: 128 mmol/L — ABNORMAL LOW (ref 135–145)
Total Bilirubin: 3.6 mg/dL — ABNORMAL HIGH (ref 0.3–1.2)
Total Protein: 3.9 g/dL — ABNORMAL LOW (ref 6.0–8.3)

## 2014-09-16 LAB — GLUCOSE, CAPILLARY
GLUCOSE-CAPILLARY: 100 mg/dL — AB (ref 70–99)
GLUCOSE-CAPILLARY: 99 mg/dL (ref 70–99)
GLUCOSE-CAPILLARY: 98 mg/dL (ref 70–99)
GLUCOSE-CAPILLARY: 116 mg/dL — AB (ref 70–99)
GLUCOSE-CAPILLARY: 120 mg/dL — AB (ref 70–99)
GLUCOSE-CAPILLARY: 124 mg/dL — AB (ref 70–99)
GLUCOSE-CAPILLARY: 124 mg/dL — AB (ref 70–99)
GLUCOSE-CAPILLARY: 123 mg/dL — AB (ref 70–99)
GLUCOSE-CAPILLARY: 113 mg/dL — AB (ref 70–99)
Glucose-Capillary: 100 mg/dL — ABNORMAL HIGH (ref 70–99)
Glucose-Capillary: 105 mg/dL — ABNORMAL HIGH (ref 70–99)
Glucose-Capillary: 108 mg/dL — ABNORMAL HIGH (ref 70–99)
Glucose-Capillary: 112 mg/dL — ABNORMAL HIGH (ref 70–99)
Glucose-Capillary: 94 mg/dL (ref 70–99)
Glucose-Capillary: 95 mg/dL (ref 70–99)
Glucose-Capillary: 112 mg/dL — ABNORMAL HIGH (ref 70–99)
Glucose-Capillary: 113 mg/dL — ABNORMAL HIGH (ref 70–99)
Glucose-Capillary: 115 mg/dL — ABNORMAL HIGH (ref 70–99)
Glucose-Capillary: 124 mg/dL — ABNORMAL HIGH (ref 70–99)
Glucose-Capillary: 191 mg/dL — ABNORMAL HIGH (ref 70–99)
Glucose-Capillary: 99 mg/dL (ref 70–99)
Glucose-Capillary: 118 mg/dL — ABNORMAL HIGH (ref 70–99)

## 2014-09-16 LAB — CBC
HCT: 30.3 % — ABNORMAL LOW (ref 39.0–52.0)
Hemoglobin: 11 g/dL — ABNORMAL LOW (ref 13.0–17.0)
MCH: 29.7 pg (ref 26.0–34.0)
MCHC: 36.3 g/dL — ABNORMAL HIGH (ref 30.0–36.0)
MCV: 81.9 fL (ref 78.0–100.0)
Platelets: 72 10*3/uL — ABNORMAL LOW (ref 150–400)
RBC: 3.7 MIL/uL — ABNORMAL LOW (ref 4.22–5.81)
RDW: 16 % — ABNORMAL HIGH (ref 11.5–15.5)
WBC: 13.1 10*3/uL — ABNORMAL HIGH (ref 4.0–10.5)

## 2014-09-16 LAB — CARBOXYHEMOGLOBIN
Carboxyhemoglobin: 1.4 % (ref 0.5–1.5)
Methemoglobin: 1.6 % — ABNORMAL HIGH (ref 0.0–1.5)
O2 Saturation: 66.4 %
Total hemoglobin: 11 g/dL — ABNORMAL LOW (ref 13.5–18.0)

## 2014-09-16 LAB — MAGNESIUM: Magnesium: 1.5 mg/dL (ref 1.5–2.5)

## 2014-09-16 LAB — BLOOD GAS, ARTERIAL
Acid-base deficit: 1.6 mmol/L (ref 0.0–2.0)
Bicarbonate: 21.9 mEq/L (ref 20.0–24.0)
FIO2: 0.4 %
MECHVT: 600 mL
O2 Saturation: 97.7 %
PEEP: 5 cmH2O
Patient temperature: 98.6
RATE: 18 resp/min
TCO2: 22.9 mmol/L (ref 0–100)
pCO2 arterial: 32.1 mmHg — ABNORMAL LOW (ref 35.0–45.0)
pH, Arterial: 7.448 (ref 7.350–7.450)
pO2, Arterial: 101 mmHg — ABNORMAL HIGH (ref 80.0–100.0)

## 2014-09-16 LAB — POCT ACTIVATED CLOTTING TIME
ACTIVATED CLOTTING TIME: 159 s
ACTIVATED CLOTTING TIME: 177 s
Activated Clotting Time: 153 seconds
Activated Clotting Time: 153 seconds
Activated Clotting Time: 153 seconds
Activated Clotting Time: 153 seconds
Activated Clotting Time: 153 seconds
Activated Clotting Time: 159 seconds
Activated Clotting Time: 171 seconds
Activated Clotting Time: 177 seconds

## 2014-09-16 LAB — PLATELET COUNT: Platelets: 57 10*3/uL — ABNORMAL LOW (ref 150–400)

## 2014-09-16 LAB — PREPARE PLATELET PHERESIS: Unit division: 0

## 2014-09-16 LAB — URINALYSIS, ROUTINE W REFLEX MICROSCOPIC
Bilirubin Urine: NEGATIVE
Glucose, UA: NEGATIVE mg/dL
Ketones, ur: 15 mg/dL — AB
NITRITE: NEGATIVE
Protein, ur: 100 mg/dL — AB
SPECIFIC GRAVITY, URINE: 1.007 (ref 1.005–1.030)
UROBILINOGEN UA: 1 mg/dL (ref 0.0–1.0)
pH: 6.5 (ref 5.0–8.0)

## 2014-09-16 LAB — URINE MICROSCOPIC-ADD ON

## 2014-09-16 LAB — HEMOGLOBIN AND HEMATOCRIT, BLOOD
HEMATOCRIT: 27.7 % — AB (ref 39.0–52.0)
Hemoglobin: 10.1 g/dL — ABNORMAL LOW (ref 13.0–17.0)

## 2014-09-16 LAB — PHOSPHORUS: Phosphorus: 2.7 mg/dL (ref 2.3–4.6)

## 2014-09-16 LAB — HEPARIN INDUCED THROMBOCYTOPENIA PNL: HEPARIN INDUCED PLT AB: 0.232 {OD_unit} (ref 0.000–0.400)

## 2014-09-16 MED ORDER — SODIUM CHLORIDE 0.9 % IV SOLN
Freq: Once | INTRAVENOUS | Status: AC
  Administered 2014-09-16: 19:00:00 via INTRAVENOUS

## 2014-09-16 MED ORDER — MILRINONE IN DEXTROSE 20 MG/100ML IV SOLN
0.1250 ug/kg/min | INTRAVENOUS | Status: DC
  Administered 2014-09-16 – 2014-09-20 (×10): 0.375 ug/kg/min via INTRAVENOUS
  Administered 2014-09-21: 0.25 ug/kg/min via INTRAVENOUS
  Administered 2014-09-21: 0.375 ug/kg/min via INTRAVENOUS
  Administered 2014-09-22 – 2014-09-27 (×8): 0.25 ug/kg/min via INTRAVENOUS
  Administered 2014-09-28 – 2014-09-29 (×3): 0.125 ug/kg/min via INTRAVENOUS
  Filled 2014-09-16 (×21): qty 100

## 2014-09-16 MED ORDER — ATORVASTATIN CALCIUM 40 MG PO TABS
40.0000 mg | ORAL_TABLET | Freq: Every day | ORAL | Status: DC
  Administered 2014-09-16 – 2014-10-04 (×17): 40 mg via ORAL
  Filled 2014-09-16 (×20): qty 1

## 2014-09-16 NOTE — Progress Notes (Signed)
      [address]       [place] [id number]             [phone]      Sedated. Did wake up and follow commands earlier  BP 95/64 mmHg  Pulse 90  Temp(Src) 98.2 F (36.8 C) (Core (Comment))  Resp 21  Ht 5\' 7"  (1.702 m)  Wt 166 lb 7.2 oz (75.5 kg)  BMI 26.06 kg/m2  SpO2 100%   Intake/Output Summary (Last 24 hours) at 09/16/14 1912 Last data filed at 09/16/14 1900  Gross per 24 hour  Intake 4536.6 ml  Output   5433 ml  Net -896.4 ml    CBG OK  RN unable to get [place] in place  Dewy RoseSteven C. Dorris FetchHendrickson, MD [facility] [phone]

## 2014-09-16 NOTE — Progress Notes (Signed)
PULMONARY / CRITICAL CARE MEDICINE   Name: [name] MRN: [id number] DOB: [birth year]    ADMISSION DATE:  09/13/2014 CONSULTATION DATE:  09/16/2014  REFERRING MD :  Donata ClayVan Trigt  CHIEF COMPLAINT:  Epigastric pain  INITIAL PRESENTATION:  74 y.o. M [facility] with epigastric pain.  Found to have acute anterolateral MI.  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  Following day 4/13, developed worsening shock so PCCM consulted for assistance with medical management.  STUDIES:  EKG 4/13 >>>  ST elevation in aVR, aVL, V1-2 with diffuse deep ST depressions inferior and anterolateral leads PCI 4/13 >>> occluded left main which was treated with balloon angioplasty with restoration of TIMI 3 flow into the circumflex system, occluded LAD and it's branches also treated with balloon angioplasty but did not have any significant flow return into the proximal mid LAD, widely patent left circ and it's branches, widely patent RCA with right to left collaterals feeding particularly the distal LAD, likely severe LV systolic dysfunction, LVEDP 38, cardiogenic shock with successful placement of Impella. Echo 4/13 >>>diffuse hypokinesis EF 35% CXR 4/14 >>> slightly increased vascular congestion with new right and mid lower lung infiltrate.  SIGNIFICANT EVENTS: 4/13 - PCI then to OR for emergent CABG (CABG x 3, LIMA - LAD, SVG - OM, SVG - Ramus, PFO closure, EVH with left greater saphenous vein).  Suffered cardiac arrest later that night likely from tamponade due to occlude chest tubes.  No inherent LV ejection with full LVAD support at 3L / min.  Complete asystole under the pacer - av paced with good thresholds.  Had persistent coagulopathy, given one dose of novosen. 4/14 - PCCM consulted  SUBJECTIVE: No distress   VITAL SIGNS: Temp:  [97.9 F (36.6 C)-99.9 F (37.7 C)] 99.3 F (37.4 C) (04/16  0800) Pulse Rate:  [31-92] 91 (04/16 0800) Resp:  [0-31] 20 (04/16 0800) BP: (65-120)/(56-79) 118/77 mmHg (04/16 0336) SpO2:  [93 %-100 %] 100 % (04/16 0811) Arterial Line BP: (57-124)/(48-79) 124/79 mmHg (04/16 0800) FiO2 (%):  [40 %] 40 % (04/16 0811) Weight:  [75.5 kg (166 lb 7.2 oz)] 75.5 kg (166 lb 7.2 oz) (04/16 0500) HEMODYNAMICS: PAP: (30-45)/(14-28) 33/19 mmHg CVP:  [9 mmHg-19 mmHg] 9 mmHg CO:  [3.2 L/min-3.5 L/min] 3.5 L/min CI:  [1.8 L/min/m2-2.1 L/min/m2] 2.1 L/min/m2 VENTILATOR SETTINGS: Vent Mode:  [-] PRVC FiO2 (%):  [40 %] 40 % Set Rate:  [18 bmp] 18 bmp Vt Set:  [600 mL] 600 mL PEEP:  [5 cmH20] 5 cmH20 Plateau Pressure:  [17 cmH20-20 cmH20] 17 cmH20 INTAKE / OUTPUT: Intake/Output      04/15 0701 - 04/16 0700 04/16 0701 - 04/17 0700   I.V. (mL/kg) 5625.8 (74.5) 158.6 (2.1)   Blood 531    Other 274.7 11.9   NG/GT 30    IV Piggyback 100    Total Intake(mL/kg) 6561.5 (86.9) 170.5 (2.3)   Urine (mL/kg/hr) 3410 (1.9) 500 (3.7)   Emesis/NG output 300 (0.2) 100 (0.7)   Chest Tube 1570 (0.9) 30 (0.2)   Total Output 5280 630   Net +1281.5 -459.6         PHYSICAL EXAMINATION: General: Critically ill appearing. Neuro: Sedated, will f/c HEENT: [place]/AT. PERRL, sclerae anicteric. Cardiovascular: RRR, no M/R/G.  Lungs: Respirations shallow and labored on full vent support. Abdomen: BS x 4, soft, NT/ND.  Musculoskeletal: No  gross deformities, no edema.  Skin: Intact, warm, no rashes.  LABS:  CBC  Recent Labs Lab 09/15/14 0910 09/15/14 1630 09/16/14 0400  WBC 10.6* 12.6* 13.1*  HGB 9.7* 10.8* 11.0*  HCT 26.8* 30.0* 30.3*  PLT 60* 101* 72*   Coag's  Recent Labs Lab 09/13/14 0357 09/13/14 1400 09/13/14 1859 09/14/14 0323  APTT 34 72* 83*  --   INR 1.05  --  2.48* 1.60*   BMET  Recent Labs Lab 09/14/14 0322  09/14/14 1604 09/15/14 0319 09/16/14 0400  NA 137  --  131* 129* 128*  K 3.2*  --  3.8 3.5 4.5  CL 103  --  99 100 99  CO2 20  --   --   21 21  BUN 11  --  CREATININE 1.24  < > 1.50* 1.64* 1.43*  GLUCOSE 188*  --  124* 121* 100*  < > = values in this interval not displayed. Electrolytes  Recent Labs Lab 09/14/14 0322 09/14/14 1510 09/15/14 0319 09/16/14 0400  CALCIUM 8.8  --  7.4* 7.4*  MG 1.9 1.7 1.6 1.5  PHOS  --   --  2.9 2.7   Sepsis Markers  Recent Labs Lab 09/14/14 1314 09/14/14 1600 09/14/14 2359  LATICACIDVEN 3.1* 3.1* 3.5*   ABG  Recent Labs Lab 09/14/14 1558 09/15/14 0500 09/16/14 0359  PHART 7.436 7.435 7.448  PCO2ART 36.8 32.2* 32.1*  PO2ART 253.0* 140.0* 101.0*   Liver Enzymes  Recent Labs Lab 09/13/14 1400 09/15/14 0319 09/16/14 0400  AST 257* 181* 148*  ALT 32 24 23  ALKPHOS 23* 36* 52  BILITOT 1.5* 2.4* 3.6*  ALBUMIN 2.1* 2.2* 1.9*   Cardiac Enzymes  Recent Labs Lab 09/13/14 1400 09/13/14 1859 09/14/14 0020  TROPONINI >80.00* >80.00* >80.00*   Glucose  Recent Labs Lab 09/16/14 0202 09/16/14 0257 09/16/14 0354 09/16/14 0500 09/16/14 0546 09/16/14 0645  GLUCAP 100* 99 98 94 95 108*    Imaging Dg Chest Port 1 View  09/15/2014   CLINICAL DATA:  CABG  EXAM: PORTABLE CHEST - 1 VIEW  COMPARISON:  09/14/2014  FINDINGS: Endotracheal tube in good position. Swan-Ganz catheter tip in the main pulmonary artery. Bilateral chest tubes remain in good position. No pneumothorax.  Progression of bibasilar atelectasis and small effusions. Vascular congestion and mild edema slightly improved.  IMPRESSION: Support lines remain in good position  Increase in bibasilar atelectasis. Mild improvement in pulmonary edema.   Electronically Signed   By: Marlan Palau M.D.   On: 09/15/2014 08:22  marked improved aeration on CXR  ASSESSMENT / PLAN:  CARDIOVASCULAR Bilateral chest tubes and mediastinal chest tube 4/13 >>> A:  Cardiogenic shock following acute anterolateral MI; EF 25-30% S/p impella placement S/p CABG x 3 with closure of PFO 4/13 Zenaida Niece Trigt) Hx HTN, HLD P:   Continue vasopressors  Per CVTS; off all except epi Goal MAP > 60  impella per thoracic surg, seems to have made a huge difference Hold outpatient propranolol.  PULMONARY OETT 4/13 >>> A: Acute hypoxic respiratory failure following CABG x 3 Hx COPD Tobacco use disorder pulm edema improved 4/16 P:   Full mechanical support, hold weaning. VAP bundle. DuoNebs / Levalbuterol / Budesonide. ABG and CXR in AM. abg reviewed, consider slight reduction MV  RENAL A:   Hyponatremia, volume overlaod P:   BMP in AM Improved with CO improvement Na should improve once to neg balance  GASTROINTESTINAL A:   GERD Nutrition P:  Famotidine. TF start  HEMATOLOGIC A:   Anemia Thrombocytopenia Coagulopathy - received one dose novoseven P:  Transfuse for Hgb < 8. Monitor platelets. SCD's. CBC in AM.  INFECTIOUS A:   Surgical prophylaxis P:   Levaquin.  ENDOCRINE A:   Hyperglycemia P:   CBG's q4hr. SSI.  NEUROLOGIC A:   Acute metabolic encephalopathy Anxiety / Depression P:   Sedation:  Precedex gtt / Midazolam PRN / Morphine PRN. RASS goal: 0 to -1. Hold WUA until more hemodynamically stable. D/c outpatient escitalopram. Hold outpatient alprazolam.  Family updated: None.  Interdisciplinary Family Meeting v Palliative Care Meeting:  Due by: 4/20.  NP summary S/p arrest w/ cardiogenic shock. Still on impella. Weaning pressors. Goal is to support over weekend and not wean impella until Monday. We will see again then.   Simonne Martinet ACNP-BC [facility] Pulmonary/Critical Care Pager # [phone] OR # [phone] if no answer  09/16/2014, 8:49 AM   STAFF NOTE: I, Rory Percy, MD FACP have personally reviewed patient's available data, including medical history, events of note, physical examination and test results as part of my evaluation. I have discussed with resident/NP and other care providers such as pharmacist, RN and RRT. In addition, I personally  evaluated patient and elicited key findings of: awake, appears well, pcxr and renal fxn improved with cardiac output support, unable to wean while on impella support, reduce rate vent slight, abg in am, feeds needed, appears epi will be dc'ed today as neg balance and improved CO, will return Monday , hope can attempt weaning if impella weaned The patient is critically ill with multiple organ systems failure and requires high complexity decision making for assessment and support, frequent evaluation and titration of therapies, application of advanced monitoring technologies and extensive interpretation of multiple databases.   Critical Care Time devoted to patient care services described in this note is30 Minutes. This time reflects time of care of this signee: Rory Percy, MD FACP. This critical care time does not reflect procedure time, or teaching time or supervisory time of PA/NP/Med student/Med Resident etc but could involve care discussion time. Rest per NP/medical resident whose note is outlined above and that I agree with   Mcarthur Rossetti. Tyson Alias, MD, FACP Pgr: [phone] [place] [facility] 09/16/2014 9:15 AM

## 2014-09-16 NOTE — Progress Notes (Signed)
3 Days Post-Op Procedure(s) (LRB): CORONARY ARTERY BYPASS GRAFTING (CABG), ON PUMP, TIMES THREE, USING LEFT INTERNAL MAMMARY ARTERY, LEFT GREATER SAPHENOUS VEIN HARVESTED ENDOSCOPICALLY (N/A) PATENT FORAMEN OVALE CLOSURE Subjective: Intubated and sedated Per RN has woken up and followed some commands   Objective: Vital signs in last 24 hours: Temp:  [98.1 F (36.7 C)-99.9 F (37.7 C)] 99.1 F (37.3 C) (04/16 0845) Pulse Rate:  [87-92] 92 (04/16 0845) Cardiac Rhythm:  [-] A-V Sequential paced (04/16 0800) Resp:  [0-31] 9 (04/16 0845) BP: (65-120)/(56-79) 118/77 mmHg (04/16 0336) SpO2:  [95 %-100 %] 99 % (04/16 0845) Arterial Line BP: (67-124)/(56-79) 89/62 mmHg (04/16 0845) FiO2 (%):  [40 %] 40 % (04/16 0811) Weight:  [166 lb 7.2 oz (75.5 kg)] 166 lb 7.2 oz (75.5 kg) (04/16 0500)  Hemodynamic parameters for last 24 hours: PAP: (25-45)/(12-28) 34/18 mmHg CVP:  [5 mmHg-15 mmHg] 9 mmHg CO:  [3.2 L/min-3.5 L/min] 3.5 L/min CI:  [1.8 L/min/m2-2.1 L/min/m2] 2.1 L/min/m2  Intake/Output from previous day: 04/15 0701 - 04/16 0700 In: 6561.5 [I.V.:5625.8; Blood:531; NG/GT:30; IV Piggyback:100] Out: 5280 [Urine:3410; Emesis/NG output:300; Chest Tube:1570] Intake/Output this shift: Total I/O In: 170.5 [I.V.:158.6; Other:11.9] Out: 630 [Urine:500; Emesis/NG output:100; Chest Tube:30]  General appearance: alert and no distress Neurologic: sedated Heart: regular rate and rhythm Lungs: clear to auscultation bilaterally Abdomen: normal findings: soft  Lab Results:  Recent Labs  09/15/14 1630 09/16/14 0400  WBC 12.6* 13.1*  HGB 10.8* 11.0*  HCT 30.0* 30.3*  PLT 101* 72*   BMET:  Recent Labs  09/15/14 0319 09/16/14 0400  NA 129* 128*  K 3.5 4.5  CL 100 99  CO2 21 21  GLUCOSE 121* 100*  BUN 17 13  CREATININE 1.64* 1.43*  CALCIUM 7.4* 7.4*    PT/INR:  Recent Labs  09/14/14 0323  LABPROT 19.2*  INR 1.60*   ABG    Component Value Date/Time   PHART 7.448  09/16/2014 0359   HCO3 21.9 09/16/2014 0359   TCO2 22.9 09/16/2014 0359   ACIDBASEDEF 1.6 09/16/2014 0359   O2SAT 66.4 09/16/2014 0359   O2SAT 97.7 09/16/2014 0359   CBG (last 3)   Recent Labs  09/16/14 0500 09/16/14 0546 09/16/14 0645  GLUCAP 94 95 108*    Assessment/Plan: S/P Procedure(s) (LRB): CORONARY ARTERY BYPASS GRAFTING (CABG), ON PUMP, TIMES THREE, USING LEFT INTERNAL MAMMARY ARTERY, LEFT GREATER SAPHENOUS VEIN HARVESTED ENDOSCOPICALLY (N/A) PATENT FORAMEN OVALE CLOSURE -  CV- remains stable with Impella support  Junctional rhythm in 80s, does conduct- change pacer to AAI  Continue full impella support  RESP- intubated and sedated. Improved RLL aeration on chest x-ray  RENAL- creatinine better this AM  ENDO- CBG well controlled  Nutrition- will place Panda in preparation for tube feeds  Anemia stable  Thrombocytopenia- PLT 72K this AM, HITT has been sent, but needs heparin for pump  No overt bleeding at present    LOS: 3 days    Loreli SlotSteven C Hendrickson 09/16/2014

## 2014-09-16 NOTE — Progress Notes (Signed)
Advanced Heart Failure Rounding Note   Subjective:    Currently sedated but responds to commands when awake.   Off levophed and neo. Remains on epi 20 and milrinone 0.2  Impella waveform looks good. Flow 2.80-3.2  Swan numbers done personally at bedside with RNs  CVP 9 PA 33/18 (23) PCWP 11 SVR 1523 Thermo 3.4/2.0    Objective:   Weight Range:  Vital Signs:   Temp:  [98.1 F (36.7 C)-99.9 F (37.7 C)] 99.1 F (37.3 C) (04/16 0845) Pulse Rate:  [88-92] 92 (04/16 0845) Resp:  [0-31] 9 (04/16 0845) BP: (65-120)/(56-79) 97/67 mmHg (04/16 0900) SpO2:  [95 %-100 %] 99 % (04/16 0845) Arterial Line BP: (67-124)/(56-79) 89/62 mmHg (04/16 0845) FiO2 (%):  [40 %] 40 % (04/16 0811) Weight:  [75.5 kg (166 lb 7.2 oz)] 75.5 kg (166 lb 7.2 oz) (04/16 0500)    Weight change: Filed Weights   09/14/14 0500 09/15/14 0500 09/16/14 0500  Weight: 73.4 kg (161 lb 13.1 oz) 75.1 kg (165 lb 9.1 oz) 75.5 kg (166 lb 7.2 oz)    Intake/Output:   Intake/Output Summary (Last 24 hours) at 09/16/14 1031 Last data filed at 09/16/14 1000  Gross per 24 hour  Intake 5053.5 ml  Output   5285 ml  Net -231.5 ml     Physical Exam: General:  Intubated sedated HEENT: ETT Neck: supple.  RIJ swan Carotids 2+ bilat; no bruits. No lymphadenopathy or thryomegaly appreciated. Cor: Sternal dressing. +CTs PMI nondisplaced. Regular rate & rhythm. Impella hum Lungs: clear Abdomen: soft, nontender, nondistended. No hepatosplenomegaly. No bruits or masses. Hypoactive bowel sounds. Extremities: no cyanosis, clubbing, rash, 1+ edema Impella site ok Neuro: alert & orientedx3, cranial nerves grossly intact. moves all 4 extremities w/o difficulty. Affect pleasant  Telemetry: A-paced at 90s. Junctional in 70s underneath  Labs: Basic Metabolic Panel:  Recent Labs Lab 09/13/14 0357  09/13/14 1400  09/14/14 0020 09/14/14 0027 09/14/14 0322 09/14/14 1510 09/14/14 1604 09/15/14 0319 09/16/14 0400  NA  134*  < > 138  < >  --  158* 137  --  131* 129* 128*  K 4.1  < > 3.1*  < >  --  3.2* 3.2*  --  3.8 3.5 4.5  CL 100  < > 108  < >  --  88* 103  --  99 100 99  CO2 25  --  23  --   --   --  20  --   --  21 21  GLUCOSE 130*  < > 265*  < >  --  186* 188*  --  124* 121* 100*  BUN 11  < > 9  < >  --  10 11  --  18 17 13   CREATININE 1.11  < > 0.90  < >  --  1.10 1.24 1.76* 1.50* 1.64* 1.43*  CALCIUM 8.7  --  7.5*  --   --   --  8.8  --   --  7.4* 7.4*  MG  --   --   --   < > 1.9  --  1.9 1.7  --  1.6 1.5  PHOS  --   --   --   --   --   --   --   --   --  2.9 2.7  < > = values in this interval not displayed.  Liver Function Tests:  Recent Labs Lab 09/13/14 1400 09/15/14 0319 09/16/14 0400  AST 257* 181*  148*  ALT 32 24 23  ALKPHOS 23* 36* 52  BILITOT 1.5* 2.4* 3.6*  PROT 3.3* 3.7* 3.9*  ALBUMIN 2.1* 2.2* 1.9*   No results for input(s): LIPASE, AMYLASE in the last 168 hours. No results for input(s): AMMONIA in the last 168 hours.  CBC:  Recent Labs Lab 09/13/14 0357  09/13/14 1310  09/14/14 2115 09/15/14 0319 09/15/14 0910 09/15/14 1630 09/16/14 0400  WBC 10.8*  --  7.4  < > 10.1 10.5 10.6* 12.6* 13.1*  NEUTROABS 9.5*  --  7.0  --   --   --   --   --   --   HGB 14.7  < > 8.2*  < > 10.2* 10.2* 9.7* 10.8* 11.0*  HCT 42.0  < > 23.7*  < > 28.2* 28.1* 26.8* 30.0* 30.3*  MCV 101.4*  --  94.4  < > 82.2 82.4 82.2 82.9 81.9  PLT 178  < > 81*  < > 100* 78* 60* 101* 72*  < > = values in this interval not displayed.  Cardiac Enzymes:  Recent Labs Lab 09/13/14 1400 09/13/14 1859 09/14/14 0020  TROPONINI >80.00* >80.00* >80.00*    BNP: BNP (last 3 results)  Recent Labs  09/13/14 1310  BNP 96.8    ProBNP (last 3 results) No results for input(s): PROBNP in the last 8760 hours.    Other results:  Imaging: Dg Chest Port 1 View  09/16/2014   CLINICAL DATA:  Postop day 3 from Emergency CABG surgery.  EXAM: PORTABLE CHEST - 1 VIEW  COMPARISON:  09/15/2014  FINDINGS:  Cardiac silhouette normal in size and configuration. There is no mediastinal widening.  Lung base opacity has significantly improved since the prior study. There is no evidence of pneumonia or pulmonary edema.  There is no pneumothorax.  Endotracheal tube, nasogastric tube, right internal jugular Swan-Ganz catheter, mediastinal tube and bilateral chest tubes are stable.  There is persistent bilateral subcutaneous emphysema.  IMPRESSION: 1. Improved lung aeration. Specifically, lung base opacity noted previously has mostly resolved. 2. No evidence of pulmonary edema or pneumonia.  No pneumothorax. 3. Support apparatus is stable and well positioned.   Electronically Signed   By: Amie Portland M.D.   On: 09/16/2014 07:39   Dg Chest Port 1 View  09/15/2014   CLINICAL DATA:  CABG  EXAM: PORTABLE CHEST - 1 VIEW  COMPARISON:  09/14/2014  FINDINGS: Endotracheal tube in good position. Swan-Ganz catheter tip in the main pulmonary artery. Bilateral chest tubes remain in good position. No pneumothorax.  Progression of bibasilar atelectasis and small effusions. Vascular congestion and mild edema slightly improved.  IMPRESSION: Support lines remain in good position  Increase in bibasilar atelectasis. Mild improvement in pulmonary edema.   Electronically Signed   By: Marlan Palau M.D.   On: 09/15/2014 08:22   Dg Chest Port 1 View  09/14/2014   CLINICAL DATA:  Coronary artery disease.  Status post CABG.  EXAM: PORTABLE CHEST - 1 VIEW  COMPARISON:  09/14/2014 and 09/13/2014  FINDINGS: Endotracheal tube, Swan-Ganz catheter, and chest tubes appear in good position, unchanged. Tip of the NG tube is at the gastroesophageal junction.  There are hazy bilateral infiltrates which may represent pulmonary edema. There small bilateral pleural effusions with slight atelectasis at the left lung base posterior medially.  Subcutaneous emphysema has slightly diminished since the prior exam.  IMPRESSION: Diminished subcutaneous emphysema.  Increased haziness in the lungs which probably represents slight pulmonary edema. Small bilateral effusions with left  base atelectasis, unchanged.   Electronically Signed   By: Francene Boyers M.D.   On: 09/14/2014 11:30      Medications:     Scheduled Medications: . antiseptic oral rinse  7 mL Mouth Rinse QID  . aspirin EC  325 mg Oral Daily   Or  . aspirin  324 mg Per Tube Daily  . bisacodyl  10 mg Oral Daily   Or  . bisacodyl  10 mg Rectal Daily  . budesonide (PULMICORT) nebulizer solution  0.5 mg Nebulization BID  . chlorhexidine  15 mL Mouth Rinse BID  . docusate sodium  200 mg Oral Daily  . ipratropium-albuterol  3 mL Nebulization Q6H  . metoprolol tartrate  12.5 mg Oral BID   Or  . metoprolol tartrate  12.5 mg Per Tube BID  . pantoprazole (PROTONIX) IV  40 mg Intravenous Q24H  . simvastatin  20 mg Oral QPM  . sodium chloride  3 mL Intravenous Q12H     Infusions: . sodium chloride 10 mL/hr at 09/15/14 1745  . sodium chloride 250 mL (09/14/14 0611)  . sodium chloride Stopped (09/15/14 1600)  . amiodarone 30 mg/hr (09/16/14 0900)  . dexmedetomidine 0.7 mcg/kg/hr (09/16/14 0900)  . DOPamine 5 mcg/kg/min (09/16/14 0900)  . EPINEPHrine 4 mg in dextrose 5% 250 mL infusion (16 mcg/mL) 20 mcg/min (09/16/14 0900)  . impella catheter heparin 50 unit/mL in dextrose 5%    . heparin    . insulin (NOVOLIN-R) infusion 2 mL/hr at 09/16/14 1000  . lactated ringers Stopped (09/15/14 1200)  . lactated ringers 20 mL/hr at 09/15/14 1500  . lidocaine Stopped (09/15/14 0800)  . milrinone 0.2 mcg/kg/min (09/16/14 0900)  . nitroGLYCERIN Stopped (09/13/14 1315)  . norepinephrine (LEVOPHED) Adult infusion Stopped (09/16/14 1000)  . phenylephrine (NEO-SYNEPHRINE) Adult infusion Stopped (09/14/14 0245)     PRN Medications:  sodium chloride, albumin human, levalbuterol, metoprolol, midazolam, morphine injection, nitroGLYCERIN, ondansetron (ZOFRAN) IV, oxyCODONE, sodium chloride,  traMADol   Assessment:   1. Cardiogenic shock    -Impella CP in place 2. Acute systolic HF EF 25-30% by TEE 4/15 3. Acute anterolateral STEMI    --Emergent CABG 4/13 4. Acute respiratory failure 5. Thrombocytopenia 6. Acute renal failure, stage III 7. Hyponatremia   Plan/Discussion:    Improving slowly but most of output still coming from pump. Continue to wean epi slowly. Increase milrinone to 0.375. Continue Impella support. Agree with heparin. Watch platelets closely. Switch simva to atorva. Continue ASA.    The patient is critically ill with multiple organ systems failure and requires high complexity decision making for assessment and support, frequent evaluation and titration of therapies, application of advanced monitoring technologies and extensive interpretation of multiple databases.   Critical Care Time devoted to patient care services described in this note is 35 Minutes.   Length of Stay: 3   Arvilla Meres MD 09/16/2014, 10:31 AM  Advanced Heart Failure Team Pager [phone] (M-F; 7a - 4p)  Please contact [facility] for night-coverage after hours (4p -7a ) and weekends on [email or web address]

## 2014-09-17 ENCOUNTER — Inpatient Hospital Stay (HOSPITAL_COMMUNITY): Payer: Medicare Other

## 2014-09-17 LAB — POCT I-STAT 3, ART BLOOD GAS (G3+)
Acid-base deficit: 1 mmol/L (ref 0.0–2.0)
BICARBONATE: 23 meq/L (ref 20.0–24.0)
O2 Saturation: 97 %
PCO2 ART: 34.4 mmHg — AB (ref 35.0–45.0)
PO2 ART: 94 mmHg (ref 80.0–100.0)
TCO2: 24 mmol/L (ref 0–100)
pH, Arterial: 7.435 (ref 7.350–7.450)

## 2014-09-17 LAB — CBC
HCT: 25.2 % — ABNORMAL LOW (ref 39.0–52.0)
HEMATOCRIT: 24.1 % — AB (ref 39.0–52.0)
HEMOGLOBIN: 8.5 g/dL — AB (ref 13.0–17.0)
Hemoglobin: 9.1 g/dL — ABNORMAL LOW (ref 13.0–17.0)
MCH: 30 pg (ref 26.0–34.0)
MCH: 29.7 pg (ref 26.0–34.0)
MCHC: 36.1 g/dL — ABNORMAL HIGH (ref 30.0–36.0)
MCHC: 35.3 g/dL (ref 30.0–36.0)
MCV: 83.2 fL (ref 78.0–100.0)
MCV: 84.3 fL (ref 78.0–100.0)
PLATELETS: 67 10*3/uL — AB (ref 150–400)
Platelets: 85 10*3/uL — ABNORMAL LOW (ref 150–400)
RBC: 3.03 MIL/uL — ABNORMAL LOW (ref 4.22–5.81)
RBC: 2.86 MIL/uL — AB (ref 4.22–5.81)
RDW: 16.4 % — ABNORMAL HIGH (ref 11.5–15.5)
RDW: 16.4 % — AB (ref 11.5–15.5)
WBC: 12.9 10*3/uL — ABNORMAL HIGH (ref 4.0–10.5)
WBC: 12.9 10*3/uL — AB (ref 4.0–10.5)

## 2014-09-17 LAB — TYPE AND SCREEN
ABO/RH(D): O POS
ANTIBODY SCREEN: NEGATIVE
UNIT DIVISION: 0
UNIT DIVISION: 0
UNIT DIVISION: 0
UNIT DIVISION: 0
UNIT DIVISION: 0
UNIT DIVISION: 0
UNIT DIVISION: 0
UNIT DIVISION: 0
UNIT DIVISION: 0
Unit division: 0
Unit division: 0
Unit division: 0
Unit division: 0
Unit division: 0
Unit division: 0
Unit division: 0
Unit division: 0

## 2014-09-17 LAB — POCT ACTIVATED CLOTTING TIME
ACTIVATED CLOTTING TIME: 171 s
ACTIVATED CLOTTING TIME: 165 s
ACTIVATED CLOTTING TIME: 159 s
ACTIVATED CLOTTING TIME: 171 s
Activated Clotting Time: 165 seconds
Activated Clotting Time: 165 seconds
Activated Clotting Time: 159 seconds
Activated Clotting Time: 159 seconds
Activated Clotting Time: 171 seconds
Activated Clotting Time: 171 seconds

## 2014-09-17 LAB — GLUCOSE, CAPILLARY
GLUCOSE-CAPILLARY: 100 mg/dL — AB (ref 70–99)
GLUCOSE-CAPILLARY: 102 mg/dL — AB (ref 70–99)
GLUCOSE-CAPILLARY: 102 mg/dL — AB (ref 70–99)
GLUCOSE-CAPILLARY: 105 mg/dL — AB (ref 70–99)
GLUCOSE-CAPILLARY: 105 mg/dL — AB (ref 70–99)
GLUCOSE-CAPILLARY: 107 mg/dL — AB (ref 70–99)
GLUCOSE-CAPILLARY: 110 mg/dL — AB (ref 70–99)
GLUCOSE-CAPILLARY: 115 mg/dL — AB (ref 70–99)
GLUCOSE-CAPILLARY: 127 mg/dL — AB (ref 70–99)
Glucose-Capillary: 101 mg/dL — ABNORMAL HIGH (ref 70–99)
Glucose-Capillary: 102 mg/dL — ABNORMAL HIGH (ref 70–99)
Glucose-Capillary: 104 mg/dL — ABNORMAL HIGH (ref 70–99)
Glucose-Capillary: 108 mg/dL — ABNORMAL HIGH (ref 70–99)
Glucose-Capillary: 115 mg/dL — ABNORMAL HIGH (ref 70–99)
Glucose-Capillary: 96 mg/dL (ref 70–99)
Glucose-Capillary: 97 mg/dL (ref 70–99)
Glucose-Capillary: 97 mg/dL (ref 70–99)

## 2014-09-17 LAB — COMPREHENSIVE METABOLIC PANEL
ALT: 19 U/L (ref 0–53)
AST: 132 U/L — ABNORMAL HIGH (ref 0–37)
Albumin: 1.6 g/dL — ABNORMAL LOW (ref 3.5–5.2)
Alkaline Phosphatase: 93 U/L (ref 39–117)
Anion gap: 8 (ref 5–15)
BUN: 13 mg/dL (ref 6–23)
CO2: 23 mmol/L (ref 19–32)
Calcium: 7.3 mg/dL — ABNORMAL LOW (ref 8.4–10.5)
Chloride: 99 mmol/L (ref 96–112)
Creatinine, Ser: 1.35 mg/dL (ref 0.50–1.35)
GFR calc Af Amer: 59 mL/min — ABNORMAL LOW (ref >90)
GFR calc non Af Amer: 51 mL/min — ABNORMAL LOW (ref >90)
Glucose, Bld: 100 mg/dL — ABNORMAL HIGH (ref 70–99)
Potassium: 3.7 mmol/L (ref 3.5–5.1)
Sodium: 130 mmol/L — ABNORMAL LOW (ref 135–145)
Total Bilirubin: 4.6 mg/dL — ABNORMAL HIGH (ref 0.3–1.2)
Total Protein: 3.5 g/dL — ABNORMAL LOW (ref 6.0–8.3)

## 2014-09-17 LAB — PREPARE PLATELET PHERESIS: UNIT DIVISION: 0

## 2014-09-17 LAB — CARBOXYHEMOGLOBIN
Carboxyhemoglobin: 1.7 % — ABNORMAL HIGH (ref 0.5–1.5)
Methemoglobin: 1.3 % (ref 0.0–1.5)
O2 Saturation: 69 %
Total hemoglobin: 9.8 g/dL — ABNORMAL LOW (ref 13.5–18.0)

## 2014-09-17 MED ORDER — POTASSIUM CHLORIDE 10 MEQ/50ML IV SOLN
10.0000 meq | INTRAVENOUS | Status: AC
  Administered 2014-09-17 (×3): 10 meq via INTRAVENOUS
  Filled 2014-09-17 (×2): qty 50

## 2014-09-17 MED ORDER — INSULIN ASPART 100 UNIT/ML ~~LOC~~ SOLN
0.0000 [IU] | SUBCUTANEOUS | Status: DC
  Administered 2014-09-17 – 2014-09-19 (×4): 2 [IU] via SUBCUTANEOUS
  Administered 2014-09-20: 1 [IU] via SUBCUTANEOUS
  Administered 2014-09-20: 2 [IU] via SUBCUTANEOUS
  Administered 2014-09-20: 1 [IU] via SUBCUTANEOUS
  Administered 2014-09-20: 2 [IU] via SUBCUTANEOUS
  Administered 2014-09-20: 1 [IU] via SUBCUTANEOUS
  Administered 2014-09-21 (×4): 2 [IU] via SUBCUTANEOUS
  Administered 2014-09-21 – 2014-09-22 (×2): 1 [IU] via SUBCUTANEOUS
  Administered 2014-09-23 – 2014-09-24 (×2): 2 [IU] via SUBCUTANEOUS
  Administered 2014-09-24 – 2014-09-25 (×3): 1 [IU] via SUBCUTANEOUS
  Administered 2014-09-25: 2 [IU] via SUBCUTANEOUS
  Administered 2014-09-25: 1 [IU] via SUBCUTANEOUS
  Administered 2014-09-25 – 2014-09-27 (×3): 2 [IU] via SUBCUTANEOUS
  Administered 2014-09-28 – 2014-10-01 (×7): 1 [IU] via SUBCUTANEOUS
  Administered 2014-10-01: 2 [IU] via SUBCUTANEOUS
  Administered 2014-10-01 – 2014-10-04 (×14): 1 [IU] via SUBCUTANEOUS
  Administered 2014-10-05: 2 [IU] via SUBCUTANEOUS

## 2014-09-17 MED ORDER — INSULIN DETEMIR 100 UNIT/ML ~~LOC~~ SOLN
15.0000 [IU] | Freq: Two times a day (BID) | SUBCUTANEOUS | Status: DC
  Administered 2014-09-17 – 2014-10-05 (×28): 15 [IU] via SUBCUTANEOUS
  Filled 2014-09-17 (×40): qty 0.15

## 2014-09-17 NOTE — Plan of Care (Addendum)
Pharmacy called re: heparin: at 700u/hr with ACT 159, orders received. Aware that Dr. Dorris FetchHendrickson said to call him if ACT 130, then he would address the level.

## 2014-09-17 NOTE — Plan of Care (Signed)
Patient with developing foot drop and external rotation  of lower legs, footdrop boots started.

## 2014-09-17 NOTE — Progress Notes (Signed)
Orthopedic Tech Progress Note Patient Details:  [name] 01/02/1941 [id number]  Ortho Devices Type of Ortho Device:  (prafo boots) Ortho Device/Splint Location: bilateral Ortho Device/Splint Interventions: Application   [name], [name] 09/17/2014, 12:04 PM

## 2014-09-17 NOTE — Progress Notes (Signed)
4 Days Post-Op Procedure(s) (LRB): CORONARY ARTERY BYPASS GRAFTING (CABG), ON PUMP, TIMES THREE, USING LEFT INTERNAL MAMMARY ARTERY, LEFT GREATER SAPHENOUS VEIN HARVESTED ENDOSCOPICALLY (N/A) PATENT FORAMEN OVALE CLOSURE Subjective: Intubated, sedated  Objective: Vital signs in last 24 hours: Temp:  [98.1 F (36.7 C)-99.7 F (37.6 C)] 99 F (37.2 C) (04/17 0900) Pulse Rate:  [45-91] 89 (04/17 0900) Cardiac Rhythm:  [-] Atrial paced (04/17 0733) Resp:  [7-30] 18 (04/17 0900) BP: (85-98)/(60-66) 93/64 mmHg (04/17 0800) SpO2:  [94 %-100 %] 100 % (04/17 0900) Arterial Line BP: (61-106)/(43-69) 86/62 mmHg (04/17 0900) FiO2 (%):  [40 %] 40 % (04/17 0805) Weight:  [158 lb 11.7 oz (72 kg)] 158 lb 11.7 oz (72 kg) (04/17 0600)  Hemodynamic parameters for last 24 hours: PAP: (26-39)/(7-22) 34/22 mmHg CVP:  [8 mmHg-18 mmHg] 8 mmHg PCWP:  [11 mmHg] 11 mmHg CO:  [3.4 L/min-4.2 L/min] 4.1 L/min CI:  [2 L/min/m2-2.5 L/min/m2] 2.4 L/min/m2  Intake/Output from previous day: 04/16 0701 - 04/17 0700 In: 4424.5 [I.V.:3518.4; Blood:390; NG/GT:180; IV Piggyback:50] Out: 5750 [Urine:4220; Emesis/NG output:650; Chest Tube:880] Intake/Output this shift: Total I/O In: 385.8 [I.V.:259.4; Other:26.4; IV Piggyback:100] Out: 330 [Urine:300; Chest Tube:30]  General appearance: no distress Neurologic: follows commands when sedation light Heart: regular rate and rhythm Lungs: clear to auscultation bilaterally Abdomen: normal findings: soft, non-tender Wound: oozing from impella site  Lab Results:  Recent Labs  09/16/14 0400 09/16/14 1500 09/17/14 0353  WBC 13.1*  --  12.9*  HGB 11.0* 10.1* 9.1*  HCT 30.3* 27.7* 25.2*  PLT 72* 57* 85*   BMET:  Recent Labs  09/16/14 0400 09/17/14 0353  NA 128* 130*  K 4.5 3.7  CL 99 99  CO2 21 23  GLUCOSE 100* 100*  BUN 13 13  CREATININE 1.43* 1.35  CALCIUM 7.4* 7.3*    PT/INR: No results for input(s): LABPROT, INR in the last 72 hours. ABG     Component Value Date/Time   PHART 7.435 09/17/2014 0352   HCO3 23.0 09/17/2014 0352   TCO2 24 09/17/2014 0352   ACIDBASEDEF 1.0 09/17/2014 0352   O2SAT 97.0 09/17/2014 0352   CBG (last 3)   Recent Labs  09/17/14 0558 09/17/14 0636 09/17/14 0750  GLUCAP 101* 97 102*    Assessment/Plan: S/P Procedure(s) (LRB): CORONARY ARTERY BYPASS GRAFTING (CABG), ON PUMP, TIMES THREE, USING LEFT INTERNAL MAMMARY ARTERY, LEFT GREATER SAPHENOUS VEIN HARVESTED ENDOSCOPICALLY (N/A) PATENT FORAMEN OVALE CLOSURE -  CV- remains stable with impella functioning well, in SR in 80s- change pacer to back up  RESP- remains intubated, gas exchange is good, CXR ok  RENAL- creatinine back to normal range, good UOP  ENDO CBG well controlled  Nutrition- I placed panda. Bilious fluid aspirated- KUB to check position  Thrombocytopenia- improved after transfusion, HITT panel sent, still pending   LOS: 4 days    Loreli SlotSteven C Jhovany Weidinger 09/17/2014

## 2014-09-17 NOTE — Progress Notes (Signed)
      [address]       [place] [id number]             [phone]      Stable day  BP 93/64 mmHg  Pulse 88  Temp(Src) 99.9 F (37.7 C) (Core (Comment))  Resp 19  Ht 5\' 7"  (1.702 m)  Wt 158 lb 11.7 oz (72 kg)  BMI 24.85 kg/m2  SpO2 100%   Intake/Output Summary (Last 24 hours) at 09/17/14 1825 Last data filed at 09/17/14 1800  Gross per 24 hour  Intake 4987.9 ml  Output   5300 ml  Net -312.1 ml    Panda at pylorus will hold off on TF until postpyloric given high OG output  Davi Rotan C. Dorris FetchHendrickson, MD [facility] [phone]

## 2014-09-17 NOTE — Progress Notes (Signed)
K+= 3.7 and creat= 1.35 w/ urine o/p > 30cc/hr; TCTS KCL protocol initiated with 10 mEq KCL in 50cc IV x 3, each over one hour.

## 2014-09-17 NOTE — Progress Notes (Signed)
Advanced Heart Failure Rounding Note   Subjective:    Currently sedated but responds to commands when awake.   Off levophed and neo. EPI now at 15. Milrinone at 0.375.   Impella waveform looks good. Flow 2.8. Co-ox 69%.   PLTs at 85k after transfusion. Hgb down slightly. No urobili. Renal function improved. UO is great off lasix.  Oozing around Impella sheath    Swan numbers done personally at bedside with RNs   PA 27/12 (17) PCWP 10 Thermo 4.1/2.4    Objective:   Weight Range:  Vital Signs:   Temp:  [98.1 F (36.7 C)-99.7 F (37.6 C)] 99.1 F (37.3 C) (04/17 1100) Pulse Rate:  [45-91] 87 (04/17 1100) Resp:  [7-30] 19 (04/17 1100) BP: (85-98)/(60-66) 93/64 mmHg (04/17 0800) SpO2:  [94 %-100 %] 100 % (04/17 1100) Arterial Line BP: (61-113)/(43-70) 113/70 mmHg (04/17 1100) FiO2 (%):  [40 %] 40 % (04/17 0805) Weight:  [72 kg (158 lb 11.7 oz)] 72 kg (158 lb 11.7 oz) (04/17 0600)    Weight change: Filed Weights   09/15/14 0500 09/16/14 0500 09/17/14 0600  Weight: 75.1 kg (165 lb 9.1 oz) 75.5 kg (166 lb 7.2 oz) 72 kg (158 lb 11.7 oz)    Intake/Output:   Intake/Output Summary (Last 24 hours) at 09/17/14 1107 Last data filed at 09/17/14 1100  Gross per 24 hour  Intake 5025.9 ml  Output   5285 ml  Net -259.1 ml     Physical Exam: General:  Intubated sedated HEENT: ETT Neck: supple.  RIJ swan Carotids 2+ bilat; no bruits. No lymphadenopathy or thryomegaly appreciated. Cor: Sternal dressing. +CTs PMI nondisplaced. Regular rate & rhythm. Impella hum Lungs: clear Abdomen: soft, nontender, nondistended. No hepatosplenomegaly. No bruits or masses. Hypoactive bowel sounds. Extremities: no cyanosis, clubbing, rash, tr edema Impella site oozing.  Neuro: alert & orientedx3, cranial nerves grossly intact. moves all 4 extremities w/o difficulty. Affect pleasant  Telemetry: SR 80s   Labs: Basic Metabolic Panel:  Recent Labs Lab 09/13/14 1400  09/14/14 0020   09/14/14 0322 09/14/14 1510 09/14/14 1604 09/15/14 0319 09/16/14 0400 09/17/14 0353  NA 138  < >  --   < > 137  --  131* 129* 128* 130*  K 3.1*  < >  --   < > 3.2*  --  3.8 3.5 4.5 3.7  CL 108  < >  --   < > 103  --  99 100 99 99  CO2 23  --   --   --  20  --   --  GLUCOSE 265*  < >  --   < > 188*  --  124* 121* 100* 100*  BUN 9  < >  --   < > 11  --  CREATININE 0.90  < >  --   < > 1.24 1.76* 1.50* 1.64* 1.43* 1.35  CALCIUM 7.5*  --   --   --  8.8  --   --  7.4* 7.4* 7.3*  MG  --   < > 1.9  --  1.9 1.7  --  1.6 1.5  --   PHOS  --   --   --   --   --   --   --  2.9 2.7  --   < > = values in this interval not displayed.  Liver Function Tests:  Recent Labs Lab 09/13/14 1400 09/15/14 0319 09/16/14 0400  09/17/14 0353  AST 257* 181* 148* 132*  ALT 32 ALKPHOS 23* 36* 52 93  BILITOT 1.5* 2.4* 3.6* 4.6*  PROT 3.3* 3.7* 3.9* 3.5*  ALBUMIN 2.1* 2.2* 1.9* 1.6*   No results for input(s): LIPASE, AMYLASE in the last 168 hours. No results for input(s): AMMONIA in the last 168 hours.  CBC:  Recent Labs Lab 09/13/14 0357  09/13/14 1310  09/15/14 0319 09/15/14 0910 09/15/14 1630 09/16/14 0400 09/16/14 1500 09/17/14 0353  WBC 10.8*  --  7.4  < > 10.5 10.6* 12.6* 13.1*  --  12.9*  NEUTROABS 9.5*  --  7.0  --   --   --   --   --   --   --   HGB 14.7  < > 8.2*  < > 10.2* 9.7* 10.8* 11.0* 10.1* 9.1*  HCT 42.0  < > 23.7*  < > 28.1* 26.8* 30.0* 30.3* 27.7* 25.2*  MCV 101.4*  --  94.4  < > 82.4 82.2 82.9 81.9  --  83.2  PLT 178  < > 81*  < > 78* 60* 101* 72* 57* 85*  < > = values in this interval not displayed.  Cardiac Enzymes:  Recent Labs Lab 09/13/14 1400 09/13/14 1859 09/14/14 0020  TROPONINI >80.00* >80.00* >80.00*    BNP: BNP (last 3 results)  Recent Labs  09/13/14 1310  BNP 96.8    ProBNP (last 3 results) No results for input(s): PROBNP in the last 8760 hours.    Other results:  Imaging: Dg Chest Port 1  View  09/17/2014   CLINICAL DATA:  Bypass surgery.  EXAM: PORTABLE CHEST - 1 VIEW  COMPARISON:  09/16/2014.  FINDINGS: The support apparatus is stable. The Swan-Ganz catheter tip is in the proximal left pulmonary artery. No definite pneumothorax and resolving subcutaneous emphysema bilaterally. Patchy areas of atelectasis but no definite pleural effusion or pulmonary edema.  IMPRESSION: Stable support apparatus.  No definite pneumothorax and resolving subcutaneous emphysema.  Streaky areas of atelectasis but no edema or effusion.   Electronically Signed   By: Rudie Meyer M.D.   On: 09/17/2014 07:18   Dg Chest Port 1 View  09/16/2014   CLINICAL DATA:  Postop day 3 from Emergency CABG surgery.  EXAM: PORTABLE CHEST - 1 VIEW  COMPARISON:  09/15/2014  FINDINGS: Cardiac silhouette normal in size and configuration. There is no mediastinal widening.  Lung base opacity has significantly improved since the prior study. There is no evidence of pneumonia or pulmonary edema.  There is no pneumothorax.  Endotracheal tube, nasogastric tube, right internal jugular Swan-Ganz catheter, mediastinal tube and bilateral chest tubes are stable.  There is persistent bilateral subcutaneous emphysema.  IMPRESSION: 1. Improved lung aeration. Specifically, lung base opacity noted previously has mostly resolved. 2. No evidence of pulmonary edema or pneumonia.  No pneumothorax. 3. Support apparatus is stable and well positioned.   Electronically Signed   By: Amie Portland M.D.   On: 09/16/2014 07:39     Medications:     Scheduled Medications: . antiseptic oral rinse  7 mL Mouth Rinse QID  . aspirin EC  325 mg Oral Daily   Or  . aspirin  324 mg Per Tube Daily  . atorvastatin  40 mg Oral q1800  . bisacodyl  10 mg Oral Daily   Or  . bisacodyl  10 mg Rectal Daily  . budesonide (PULMICORT) nebulizer solution  0.5 mg Nebulization BID  . chlorhexidine  15  mL Mouth Rinse BID  . docusate sodium  200 mg Oral Daily  . insulin  aspart  0-9 Units Subcutaneous 6 times per day  . insulin detemir  15 Units Subcutaneous BID  . ipratropium-albuterol  3 mL Nebulization Q6H  . pantoprazole (PROTONIX) IV  40 mg Intravenous Q24H  . sodium chloride  3 mL Intravenous Q12H    Infusions: . sodium chloride 10 mL/hr at 09/17/14 1100  . sodium chloride 250 mL (09/14/14 0611)  . sodium chloride Stopped (09/15/14 1600)  . amiodarone 30 mg/hr (09/17/14 1100)  . dexmedetomidine 1.199 mcg/kg/hr (09/17/14 1100)  . DOPamine 4.967 mcg/kg/min (09/17/14 1100)  . EPINEPHrine 4 mg in dextrose 5% 250 mL infusion (16 mcg/mL) 15.013 mcg/min (09/17/14 1100)  . impella catheter heparin 50 unit/mL in dextrose 5%    . heparin 600 Units/hr (09/17/14 1100)  . lactated ringers 20 mL/hr at 09/17/14 0900  . lactated ringers 20 mL/hr at 09/17/14 1100  . lidocaine Stopped (09/15/14 0800)  . milrinone 0.375 mcg/kg/min (09/17/14 1100)  . nitroGLYCERIN Stopped (09/13/14 1315)  . norepinephrine (LEVOPHED) Adult infusion Stopped (09/16/14 1000)    PRN Medications: sodium chloride, albumin human, levalbuterol, midazolam, morphine injection, nitroGLYCERIN, ondansetron (ZOFRAN) IV, oxyCODONE, sodium chloride, traMADol   Assessment:   1. Cardiogenic shock    -Impella CP in place 2. Acute systolic HF EF 25-30% by TEE 4/15 3. Acute anterolateral STEMI    --Emergent CABG 4/13 4. Acute respiratory failure 5. Thrombocytopenia 6. Acute renal failure, stage III 7. Hyponatremia   Plan/Discussion:    Improving slowly. Cardiac output now augmenting significantly over pump flow. Continue to wean epi as tolerated but he appears very sensitive to it. Continue milrinone at 0.375. Continue Impella support. Agree with heparin. Watch platelets closely.  Continue atorva and ASA. No need for lasix currently. Watch closely for infection.   Impella waveform looks good. Sheath advanced and oozing slowed.   Check CBC and T/S at 2pm.    The patient is critically  ill with multiple organ systems failure and requires high complexity decision making for assessment and support, frequent evaluation and titration of therapies, application of advanced monitoring technologies and extensive interpretation of multiple databases.   Critical Care Time devoted to patient care services described in this note is 35 Minutes.   Length of Stay: 4   Arvilla Meresaniel Prosperity Darrough MD 09/17/2014, 11:07 AM  Advanced Heart Failure Team Pager [phone] (M-F; 7a - 4p)  Please contact [facility] for night-coverage after hours (4p -7a ) and weekends on [email or web address]

## 2014-09-18 ENCOUNTER — Inpatient Hospital Stay (HOSPITAL_COMMUNITY): Payer: Medicare Other

## 2014-09-18 DIAGNOSIS — I480 Paroxysmal atrial fibrillation: Secondary | ICD-10-CM

## 2014-09-18 DIAGNOSIS — Z95811 Presence of heart assist device: Secondary | ICD-10-CM | POA: Insufficient documentation

## 2014-09-18 LAB — POCT I-STAT 3, ART BLOOD GAS (G3+)
ACID-BASE DEFICIT: 1 mmol/L (ref 0.0–2.0)
Acid-base deficit: 2 mmol/L (ref 0.0–2.0)
BICARBONATE: 23.3 meq/L (ref 20.0–24.0)
BICARBONATE: 22.2 meq/L (ref 20.0–24.0)
O2 SAT: 98 %
O2 Saturation: 94 %
PH ART: 7.41 (ref 7.350–7.450)
PO2 ART: 100 mmHg (ref 80.0–100.0)
Patient temperature: 37
Patient temperature: 37.1
TCO2: 24 mmol/L (ref 0–100)
TCO2: 23 mmol/L (ref 0–100)
pCO2 arterial: 35.8 mmHg (ref 35.0–45.0)
pCO2 arterial: 35 mmHg (ref 35.0–45.0)
pH, Arterial: 7.421 (ref 7.350–7.450)
pO2, Arterial: 69 mmHg — ABNORMAL LOW (ref 80.0–100.0)

## 2014-09-18 LAB — COMPREHENSIVE METABOLIC PANEL
ALT: 20 U/L (ref 0–53)
AST: 81 U/L — ABNORMAL HIGH (ref 0–37)
Albumin: 1.5 g/dL — ABNORMAL LOW (ref 3.5–5.2)
Alkaline Phosphatase: 66 U/L (ref 39–117)
Anion gap: 6 (ref 5–15)
BUN: 12 mg/dL (ref 6–23)
CO2: 23 mmol/L (ref 19–32)
Calcium: 7.1 mg/dL — ABNORMAL LOW (ref 8.4–10.5)
Chloride: 100 mmol/L (ref 96–112)
Creatinine, Ser: 1.18 mg/dL (ref 0.50–1.35)
GFR calc Af Amer: 70 mL/min — ABNORMAL LOW (ref >90)
GFR calc non Af Amer: 60 mL/min — ABNORMAL LOW (ref >90)
Glucose, Bld: 112 mg/dL — ABNORMAL HIGH (ref 70–99)
Potassium: 3.5 mmol/L (ref 3.5–5.1)
Sodium: 129 mmol/L — ABNORMAL LOW (ref 135–145)
Total Bilirubin: 7.4 mg/dL — ABNORMAL HIGH (ref 0.3–1.2)
Total Protein: 3.5 g/dL — ABNORMAL LOW (ref 6.0–8.3)

## 2014-09-18 LAB — POCT I-STAT, CHEM 8
BUN: 12 mg/dL (ref 6–23)
Calcium, Ion: 1.13 mmol/L (ref 1.13–1.30)
Chloride: 97 mmol/L (ref 96–112)
Creatinine, Ser: 1.1 mg/dL (ref 0.50–1.35)
Glucose, Bld: 102 mg/dL — ABNORMAL HIGH (ref 70–99)
HEMATOCRIT: 28 % — AB (ref 39.0–52.0)
Hemoglobin: 9.5 g/dL — ABNORMAL LOW (ref 13.0–17.0)
Potassium: 3.8 mmol/L (ref 3.5–5.1)
Sodium: 130 mmol/L — ABNORMAL LOW (ref 135–145)
TCO2: 21 mmol/L (ref 0–100)

## 2014-09-18 LAB — CBC
HCT: 23.7 % — ABNORMAL LOW (ref 39.0–52.0)
Hemoglobin: 8.2 g/dL — ABNORMAL LOW (ref 13.0–17.0)
MCH: 29.4 pg (ref 26.0–34.0)
MCHC: 34.6 g/dL (ref 30.0–36.0)
MCV: 84.9 fL (ref 78.0–100.0)
Platelets: 40 10*3/uL — ABNORMAL LOW (ref 150–400)
RBC: 2.79 MIL/uL — ABNORMAL LOW (ref 4.22–5.81)
RDW: 16.4 % — ABNORMAL HIGH (ref 11.5–15.5)
WBC: 11.8 10*3/uL — ABNORMAL HIGH (ref 4.0–10.5)

## 2014-09-18 LAB — POCT ACTIVATED CLOTTING TIME
ACTIVATED CLOTTING TIME: 177 s
ACTIVATED CLOTTING TIME: 165 s
ACTIVATED CLOTTING TIME: 165 s
Activated Clotting Time: 165 seconds

## 2014-09-18 LAB — GLUCOSE, CAPILLARY
GLUCOSE-CAPILLARY: 97 mg/dL (ref 70–99)
Glucose-Capillary: 110 mg/dL — ABNORMAL HIGH (ref 70–99)

## 2014-09-18 LAB — CARBOXYHEMOGLOBIN
Carboxyhemoglobin: 2.2 % — ABNORMAL HIGH (ref 0.5–1.5)
Methemoglobin: 1.2 % (ref 0.0–1.5)
O2 Saturation: 69.4 %
Total hemoglobin: 8.2 g/dL — ABNORMAL LOW (ref 13.5–18.0)

## 2014-09-18 LAB — PREPARE RBC (CROSSMATCH)

## 2014-09-18 LAB — LACTATE DEHYDROGENASE: LDH: 1023 U/L — ABNORMAL HIGH (ref 94–250)

## 2014-09-18 MED ORDER — VITAL 1.5 CAL PO LIQD
1000.0000 mL | ORAL | Status: DC
  Administered 2014-09-18 – 2014-09-19 (×2): 1000 mL
  Filled 2014-09-18 (×5): qty 1000

## 2014-09-18 MED ORDER — POTASSIUM CHLORIDE 10 MEQ/50ML IV SOLN
INTRAVENOUS | Status: AC
  Administered 2014-09-18: 10 meq via INTRAVENOUS
  Filled 2014-09-18: qty 50

## 2014-09-18 MED ORDER — AMIODARONE LOAD VIA INFUSION
300.0000 mg | Freq: Once | INTRAVENOUS | Status: AC
  Administered 2014-09-18: 300 mg via INTRAVENOUS
  Filled 2014-09-18: qty 166.67

## 2014-09-18 MED ORDER — POTASSIUM CHLORIDE 10 MEQ/50ML IV SOLN
10.0000 meq | Freq: Once | INTRAVENOUS | Status: AC
  Administered 2014-09-18: 10 meq via INTRAVENOUS
  Filled 2014-09-18: qty 50

## 2014-09-18 MED ORDER — POTASSIUM CHLORIDE 10 MEQ/50ML IV SOLN
10.0000 meq | INTRAVENOUS | Status: AC
  Administered 2014-09-18 (×4): 10 meq via INTRAVENOUS
  Filled 2014-09-18 (×4): qty 50

## 2014-09-18 MED ORDER — POTASSIUM CHLORIDE 10 MEQ/50ML IV SOLN
10.0000 meq | INTRAVENOUS | Status: AC
  Administered 2014-09-18 (×2): 10 meq via INTRAVENOUS

## 2014-09-18 MED ORDER — DIGOXIN 0.25 MG/ML IJ SOLN
0.1250 mg | Freq: Every day | INTRAMUSCULAR | Status: DC
Start: 2014-09-18 — End: 2014-09-22
  Administered 2014-09-18 – 2014-09-21 (×4): 0.125 mg via INTRAVENOUS
  Filled 2014-09-18 (×5): qty 0.5

## 2014-09-18 MED ORDER — METOPROLOL TARTRATE 1 MG/ML IV SOLN: 2.5000 mg | INTRAVENOUS | Status: DC | PRN

## 2014-09-18 MED ORDER — SODIUM CHLORIDE 0.9 % IV SOLN
1.0000 mg/h | INTRAVENOUS | Status: DC
  Administered 2014-09-18: 1 mg/h via INTRAVENOUS
  Administered 2014-09-19: 4 mg/h via INTRAVENOUS
  Administered 2014-09-20: 8 mg/h via INTRAVENOUS
  Administered 2014-09-20: 2 mg/h via INTRAVENOUS
  Administered 2014-09-20: 3 mg/h via INTRAVENOUS
  Administered 2014-09-21 (×2): 4 mg/h via INTRAVENOUS
  Administered 2014-09-22 – 2014-09-24 (×6): 6 mg/h via INTRAVENOUS
  Filled 2014-09-18 (×13): qty 10

## 2014-09-18 MED ORDER — AMIODARONE IV BOLUS ONLY 150 MG/100ML
150.0000 mg | Freq: Once | INTRAVENOUS | Status: AC
  Administered 2014-09-18: 150 mg via INTRAVENOUS

## 2014-09-18 NOTE — Progress Notes (Signed)
K+= 3.5 and creat= 1.18 w/ urine o/p > 30cc/hr; TCTS KCL protocol initiated with 10 mEq KCL in 50cc IV x 3, each over one hour.

## 2014-09-18 NOTE — Progress Notes (Signed)
PULMONARY / CRITICAL CARE MEDICINE   Name: [name] MRN: [id number] DOB: [birth year]    ADMISSION DATE:  09/13/2014 CONSULTATION DATE:  09/18/2014  REFERRING MD :  Donata Clay  CHIEF COMPLAINT:  Epigastric pain  INITIAL PRESENTATION:  74 y.o. M brought to [facility] 4/13 with epigastric pain.  Found to have acute anterolateral MI.  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  Following day 4/13, developed worsening shock so PCCM consulted for assistance with medical management.  STUDIES:  EKG 4/13 >>>  ST elevation in aVR, aVL, V1-2 with diffuse deep ST depressions inferior and anterolateral leads PCI 4/13 >>> occluded left main which was treated with balloon angioplasty with restoration of TIMI 3 flow into the circumflex system, occluded LAD and it's branches also treated with balloon angioplasty but did not have any significant flow return into the proximal mid LAD, widely patent left circ and it's branches, widely patent RCA with right to left collaterals feeding particularly the distal LAD, likely severe LV systolic dysfunction, LVEDP 38, cardiogenic shock with successful placement of Impella. Echo 4/13 >>>diffuse hypokinesis EF 35% CXR 4/14 >>> slightly increased vascular congestion with new right and mid lower lung infiltrate.  SIGNIFICANT EVENTS: 4/13 - PCI then to OR for emergent CABG (CABG x 3, LIMA - LAD, SVG - OM, SVG - Ramus, PFO closure, EVH with left greater saphenous vein).  Suffered cardiac arrest later that night likely from tamponade due to occlude chest tubes.  No inherent LV ejection with full LVAD support at 3L / min.  Complete asystole under the pacer - av paced with good thresholds.  Had persistent coagulopathy, given one dose of novosen. 4/14 - PCCM consulted  SUBJECTIVE: No distress   VITAL SIGNS: Temp:  [98.2 F (36.8 C)-99.9 F (37.7 C)] 98.2 F (36.8 C) (04/18  1000) Pulse Rate:  [49-134] 78 (04/18 1000) Resp:  [12-25] 16 (04/18 1000) BP: (83-89)/(60-62) 83/62 mmHg (04/18 0525) SpO2:  [82 %-100 %] 100 % (04/18 1000) Arterial Line BP: (68-113)/(52-71) 106/69 mmHg (04/18 1000) FiO2 (%):  [40 %] 40 % (04/18 0945) Weight:  [75.8 kg (167 lb 1.7 oz)] 75.8 kg (167 lb 1.7 oz) (04/18 0600) HEMODYNAMICS: PAP: (23-37)/(10-23) 31/16 mmHg CVP:  [6 mmHg-12 mmHg] 9 mmHg CO:  [3.1 L/min-4.2 L/min] 3.1 L/min CI:  [1.8 L/min/m2-2.5 L/min/m2] 1.8 L/min/m2 VENTILATOR SETTINGS: Vent Mode:  [-] PRVC FiO2 (%):  [40 %] 40 % Set Rate:  [14 bmp] 14 bmp Vt Set:  [600 mL] 600 mL PEEP:  [5 cmH20] 5 cmH20 Plateau Pressure:  [15 cmH20-21 cmH20] 21 cmH20 INTAKE / OUTPUT: Intake/Output      04/17 0701 - 04/18 0700 04/18 0701 - 04/19 0700   I.V. (mL/kg) 3955.9 (52.2) 786.9 (10.4)   Blood  30   Other 307.6    NG/GT 150    IV Piggyback 250 50   Total Intake(mL/kg) 4663.5 (61.5) 866.9 (11.4)   Urine (mL/kg/hr) 3995 (2.2) 475 (1.7)   Emesis/NG output 450 (0.2)    Chest Tube 510 (0.3) 140 (0.5)   Total Output 4955 615   Net -291.5 +251.9         PHYSICAL EXAMINATION: General: Critically ill appearing. Neuro: Sedated, will f/c HEENT: [place]/AT. PERRL, sclerae anicteric. Cardiovascular: RRR, no M/R/G.  Lungs: Respirations shallow and labored on full vent support. Abdomen: BS x 4, soft, NT/ND.  Musculoskeletal: No gross  deformities, no edema.  Skin: Intact, warm, no rashes.  LABS:  CBC  Recent Labs Lab 09/17/14 0353 09/17/14 1400 09/18/14 0342  WBC 12.9* 12.9* 11.8*  HGB 9.1* 8.5* 8.2*  HCT 25.2* 24.1* 23.7*  PLT 85* 67* 40*   Coag's  Recent Labs Lab 09/13/14 0357 09/13/14 1400 09/13/14 1859 09/14/14 0323  APTT 34 72* 83*  --   INR 1.05  --  2.48* 1.60*   BMET  Recent Labs Lab 09/16/14 0400 09/17/14 0353 09/18/14 0342  NA 128* 130* 129*  K 4.5 3.7 3.5  CL 99 99 100  CO2 21 23 23   BUN 13 13 12   CREATININE 1.43* 1.35 1.18  GLUCOSE 100*  100* 112*   Electrolytes  Recent Labs Lab 09/14/14 1510 09/15/14 0319 09/16/14 0400 09/17/14 0353 09/18/14 0342  CALCIUM  --  7.4* 7.4* 7.3* 7.1*  MG 1.7 1.6 1.5  --   --   PHOS  --  2.9 2.7  --   --    Sepsis Markers  Recent Labs Lab 09/14/14 1314 09/14/14 1600 09/14/14 2359  LATICACIDVEN 3.1* 3.1* 3.5*   ABG  Recent Labs Lab 09/17/14 0352 09/18/14 0352 09/18/14 0611  PHART 7.435 7.421 7.410  PCO2ART 34.4* 35.8 35.0  PO2ART 94.0 100.0 69.0*   Liver Enzymes  Recent Labs Lab 09/16/14 0400 09/17/14 0353 09/18/14 0342  AST 148* 132* 81*  ALT 23 19 20   ALKPHOS 52 93 66  BILITOT 3.6* 4.6* 7.4*  ALBUMIN 1.9* 1.6* 1.5*   Cardiac Enzymes  Recent Labs Lab 09/13/14 1400 09/13/14 1859 09/14/14 0020  TROPONINI >80.00* >80.00* >80.00*   Glucose  Recent Labs Lab 09/17/14 1120 09/17/14 1519 09/17/14 1958 09/17/14 2349 09/18/14 0352 09/18/14 0810  GLUCAP 108* 127* 110* 115* 97 110*    Imaging Dg Chest Port 1 View  09/17/2014   CLINICAL DATA:  Bypass surgery.  EXAM: PORTABLE CHEST - 1 VIEW  COMPARISON:  09/16/2014.  FINDINGS: The support apparatus is stable. The Swan-Ganz catheter tip is in the proximal left pulmonary artery. No definite pneumothorax and resolving subcutaneous emphysema bilaterally. Patchy areas of atelectasis but no definite pleural effusion or pulmonary edema.  IMPRESSION: Stable support apparatus.  No definite pneumothorax and resolving subcutaneous emphysema.  Streaky areas of atelectasis but no edema or effusion.   Electronically Signed   By: Rudie MeyerP.  Gallerani M.D.   On: 09/17/2014 07:18   Dg Abd Portable 1v  09/17/2014   CLINICAL DATA:  74 year old male with a history of feeding tube placement.  EXAM: PORTABLE ABDOMEN - 1 VIEW  COMPARISON:  Chest x-ray 09/17/2014  FINDINGS: Single frontal supine view of the abdomen demonstrates metallic tip enteric feeding tube with the tip at the stomach pylorus. Coaxial wire has been withdrawn.  Gas  within the stomach.  Transcutaneous pacing leads project over the upper abdomen as well as thoracostomy tubes.  Paucity of small bowel gas.  No acute fracture identified.  IMPRESSION: Metallic tip feeding tube terminates at the pylorus of the stomach.  Support apparatus project over the upper abdomen, in this patient status post CABG.  Signed,  Yvone NeuJaime S. Loreta AveWagner, DO  [facility]  [facility]   Electronically Signed   By: Gilmer MorJaime  Wagner D.O.   On: 09/17/2014 11:39  marked improved aeration on CXR  ASSESSMENT / PLAN:  CARDIOVASCULAR Bilateral chest tubes and mediastinal chest tube 4/13 >>> A:  Cardiogenic shock following acute anterolateral MI; EF 25-30% S/p impella placement S/p CABG x  3 with closure of PFO 4/13 Zenaida Niece Trigt) Hx HTN, HLD P:  Continue vasopressors, epi 17, dopamine 5 and milrinone 0.375 Goal MAP > 60  Impella per cards Hold outpatient propranolol.  PULMONARY OETT 4/13 >>> A: Acute hypoxic respiratory failure following CABG x 3 Hx COPD Tobacco use disorder pulm edema improved 4/16 P:   Full mechanical support, hold weaning while on pressors and Impella, weaned relatively ok when changed bedside, hemodynamics preclude extubation however. VAP bundle. DuoNebs / Levalbuterol / Budesonide. ABG and CXR in AM. abg reviewed, consider slight reduction MV  RENAL A:   Hyponatremia, volume overlaod P:   BMP in AM Improved with CO improvement Na should improve once to neg balance Hold all diureses at this point  GASTROINTESTINAL A:   GERD Nutrition P:   Famotidine. TF per nutrition  HEMATOLOGIC A:   Anemia Thrombocytopenia Coagulopathy - received one dose novoseven P:  Transfuse for Hgb < 8. Monitor platelets. SCD's. CBC in AM.  INFECTIOUS A:   Surgical prophylaxis P:   Levaquin.  ENDOCRINE A:   Hyperglycemia P:   CBG's q4hr. SSI.  NEUROLOGIC A:   Acute metabolic encephalopathy Anxiety /  Depression P:   Sedation:  Precedex gtt / Midazolam PRN / Morphine PRN. RASS goal: 0 to -1. Hold WUA until more hemodynamically stable. D/c outpatient escitalopram. Hold outpatient alprazolam.  Family updated: None.  Interdisciplinary Family Meeting v Palliative Care Meeting:  Due by: 4/20.  The patient is critically ill with multiple organ systems failure and requires high complexity decision making for assessment and support, frequent evaluation and titration of therapies, application of advanced monitoring technologies and extensive interpretation of multiple databases.   Critical Care Time devoted to patient care services described in this note is  35  Minutes. This time reflects time of care of this signee Dr Koren Bound. This critical care time does not reflect procedure time, or teaching time or supervisory time of PA/NP/Med student/Med Resident etc but could involve care discussion time.  Alyson Reedy, M.D. [facility] Pulmonary/Critical Care Medicine. Pager: [phone]. After hours pager: [phone].  09/18/2014, 10:38 AM

## 2014-09-18 NOTE — Progress Notes (Signed)
T[facility] DAILY ICU PROGRESS NOTE                   [address]            [place] [id number]          [phone]   5 Days Post-Op Procedure(s) (LRB): CORONARY ARTERY BYPASS GRAFTING (CABG), ON PUMP, TIMES THREE, USING LEFT INTERNAL MAMMARY ARTERY, LEFT GREATER SAPHENOUS VEIN HARVESTED ENDOSCOPICALLY (N/A) PATENT FORAMEN OVALE CLOSURE  Total Length of Stay:  LOS: 5 days   Subjective: Hemodynamics have not been as good with afib, also more sedation required for agitation . Epi and milrinone have been increased some.   Objective: Vital signs in last 24 hours: Temp:  [98.6 F (37 C)-99.9 F (37.7 C)] 98.8 F (37.1 C) (04/18 0700) Pulse Rate:  [49-134] 134 (04/18 0700) Cardiac Rhythm:  [-] Atrial fibrillation (04/18 0500) Resp:  [12-25] 23 (04/18 0700) BP: (83-93)/(60-64) 83/62 mmHg (04/18 0525) SpO2:  [82 %-100 %] 82 % (04/18 0700) Arterial Line BP: (68-113)/(52-71) 82/59 mmHg (04/18 0700) FiO2 (%):  [40 %] 40 % (04/18 0525) Weight:  [167 lb 1.7 oz (75.8 kg)] 167 lb 1.7 oz (75.8 kg) (04/18 0600)  Filed Weights   09/16/14 0500 09/17/14 0600 09/18/14 0600  Weight: 166 lb 7.2 oz (75.5 kg) 158 lb 11.7 oz (72 kg) 167 lb 1.7 oz (75.8 kg)    Weight change: 8 lb 6 oz (3.8 kg)   Hemodynamic parameters for last 24 hours: PAP: (24-37)/(10-23) 25/15 mmHg CVP:  [6 mmHg-12 mmHg] 8 mmHg CO:  [3.9 L/min-4.2 L/min] 3.9 L/min CI:  [2.3 L/min/m2-2.5 L/min/m2] 2.3 L/min/m2  Intake/Output from previous day: 04/17 0701 - 04/18 0700 In: 4663.5 [I.V.:3955.9; NG/GT:150; IV Piggyback:250] Out: 4955 [Urine:3995; Emesis/NG output:450; Chest Tube:510]  Intake/Output this shift:   Vent Mode:  [-] PRVC FiO2 (%):  [40 %] 40 % Set Rate:  [14 bmp] 14 bmp Vt Set:  [600 mL] 600 mL PEEP:  [5 cmH20] 5 cmH20 Plateau Pressure:  [15 cmH20-20 cmH20] 15 cmH20 Current Meds: Scheduled Meds: . antiseptic oral rinse  7 mL Mouth Rinse QID  . aspirin EC  325 mg Oral Daily   Or  . aspirin   324 mg Per Tube Daily  . atorvastatin  40 mg Oral q1800  . bisacodyl  10 mg Oral Daily   Or  . bisacodyl  10 mg Rectal Daily  . budesonide (PULMICORT) nebulizer solution  0.5 mg Nebulization BID  . chlorhexidine  15 mL Mouth Rinse BID  . docusate sodium  200 mg Oral Daily  . insulin aspart  0-9 Units Subcutaneous 6 times per day  . insulin detemir  15 Units Subcutaneous BID  . ipratropium-albuterol  3 mL Nebulization Q6H  . pantoprazole (PROTONIX) IV  40 mg Intravenous Q24H  . potassium chloride  10 mEq Intravenous Q1 Hr x 3  . sodium chloride  3 mL Intravenous Q12H   Continuous Infusions: . sodium chloride 10 mL/hr at 09/17/14 1900  . sodium chloride 250 mL (09/14/14 0611)  . sodium chloride Stopped (09/15/14 1600)  . amiodarone 30 mg/hr (09/18/14 0700)  . dexmedetomidine 1.2 mcg/kg/hr (09/18/14 0700)  . DOPamine 5 mcg/kg/min (09/18/14 0700)  . EPINEPHrine 4 mg in dextrose 5% 250 mL infusion (16 mcg/mL) 20 mcg/min (09/18/14 0700)  . impella catheter heparin 50 unit/mL in dextrose 5%    . heparin 900 Units/hr (09/18/14 0700)  . lactated ringers 20 mL/hr at 09/17/14 0900  .  lactated ringers 20 mL/hr at 09/17/14 1900  . lidocaine Stopped (09/15/14 0800)  . milrinone 0.375 mcg/kg/min (09/18/14 0700)  . nitroGLYCERIN Stopped (09/13/14 1315)  . norepinephrine (LEVOPHED) Adult infusion Stopped (09/16/14 1000)   PRN Meds:.sodium chloride, albumin human, levalbuterol, metoprolol, midazolam, morphine injection, nitroGLYCERIN, ondansetron (ZOFRAN) IV, oxyCODONE, sodium chloride, traMADol  General appearance: sedated Neurologic: moves all extrems Heart: irregularly irregular rhythm Lungs: + wheezes Abdomen: soft, non-distended Extremities: + edema has increased Wound: incis ok  Lab Results: CBC: Recent Labs  09/17/14 1400 09/18/14 0342  WBC 12.9* 11.8*  HGB 8.5* 8.2*  HCT 24.1* 23.7*  PLT 67* 40*   BMET:  Recent Labs  09/17/14 0353 09/18/14 0342  NA 130* 129*  K 3.7  3.5  CL 99 100  CO2 23 23  GLUCOSE 100* 112*  BUN 13 12  CREATININE 1.35 1.18  CALCIUM 7.3* 7.1*    ABG    Component Value Date/Time   PHART 7.410 09/18/2014 0611   PCO2ART 35.0 09/18/2014 0611   PO2ART 69.0* 09/18/2014 0611   HCO3 22.2 09/18/2014 0611   TCO2 23 09/18/2014 0611   ACIDBASEDEF 2.0 09/18/2014 0611   O2SAT 94.0 09/18/2014 0611     PT/INR: No results for input(s): LABPROT, INR in the last 72 hours. Radiology: Dg Chest Port 1 View  09/17/2014   CLINICAL DATA:  Bypass surgery.  EXAM: PORTABLE CHEST - 1 VIEW  COMPARISON:  09/16/2014.  FINDINGS: The support apparatus is stable. The Swan-Ganz catheter tip is in the proximal left pulmonary artery. No definite pneumothorax and resolving subcutaneous emphysema bilaterally. Patchy areas of atelectasis but no definite pleural effusion or pulmonary edema.  IMPRESSION: Stable support apparatus.  No definite pneumothorax and resolving subcutaneous emphysema.  Streaky areas of atelectasis but no edema or effusion.   Electronically Signed   By: Rudie MeyerP.  Gallerani M.D.   On: 09/17/2014 07:18   Dg Abd Portable 1v  09/17/2014   CLINICAL DATA:  74 year old male with a history of feeding tube placement.  EXAM: PORTABLE ABDOMEN - 1 VIEW  COMPARISON:  Chest x-ray 09/17/2014  FINDINGS: Single frontal supine view of the abdomen demonstrates metallic tip enteric feeding tube with the tip at the stomach pylorus. Coaxial wire has been withdrawn.  Gas within the stomach.  Transcutaneous pacing leads project over the upper abdomen as well as thoracostomy tubes.  Paucity of small bowel gas.  No acute fracture identified.  IMPRESSION: Metallic tip feeding tube terminates at the pylorus of the stomach.  Support apparatus project over the upper abdomen, in this patient status post CABG.  Signed,  Yvone NeuJaime S. Loreta AveWagner, DO  [facility]  [facility]   Electronically Signed   By: Gilmer MorJaime  Wagner D.O.   On: 09/17/2014 11:39      Assessment/Plan: S/P Procedure(s) (LRB): CORONARY ARTERY BYPASS GRAFTING (CABG), ON PUMP, TIMES THREE, USING LEFT INTERNAL MAMMARY ARTERY, LEFT GREATER SAPHENOUS VEIN HARVESTED ENDOSCOPICALLY (N/A) PATENT FORAMEN OVALE CLOSURE  1 remains critically ill with high levels of inotropic support and Impella dependent, continue same On amiodarone gtt and received bolus. Parameters dipped for a while but are improving currently 2 may benefit from PRBC and platelet transfusion 3 UO, Creat are stable but volume overload is increasing. He also had frothy sputum whisch may indicate pulm edema but CXR/Sganz readings are ok-monitor closely as may need diuretic 4 nutrition if can get feeding tube past pyloris    Latron Ribas E 09/18/2014 7:34 AM

## 2014-09-18 NOTE — Progress Notes (Signed)
Patient ID: Justin MattocksDennis W Knight, male   DOB: 23-Dec-1940, 74 y.o.   MRN: [id number]  SICU Evening Rounds:  Hemodynamically stable on milrinone, dop, epi, Impella. CI 2.1 PA 31/16  Remains on vent  Urine ouput good BMET    Component Value Date/Time   NA 130* 09/18/2014 1525   K 3.8 09/18/2014 1525   CL 97 09/18/2014 1525   CO2 23 09/18/2014 0342   GLUCOSE 102* 09/18/2014 1525   BUN 12 09/18/2014 1525   CREATININE 1.10 09/18/2014 1525   CALCIUM 7.1* 09/18/2014 0342   GFRNONAA 60* 09/18/2014 0342   GFRAA 70* 09/18/2014 0342    CT output low  Continue current pressor/inotrope support

## 2014-09-18 NOTE — Progress Notes (Signed)
Dr Dorris FetchHendrickson contacted r/t new onset of Rapid Afib with subsequent decrease in hemodynamic; labs reviewed as well as current drip rates, orders received for amiodarone bolus and increase replenishment of K+. Will continue to monitor and assess.

## 2014-09-18 NOTE — Progress Notes (Signed)
Advanced Heart Failure Rounding Note   Subjective:    Currently sedated but responds to commands when awake.   Went into AF overnight. Given amio 150 IV. Had to go up on epi. On Milrinone 0.375 mcg, dopamine 5 mcg, and epi 20 mcg, and amio drip Off levophed and neo.   Impella waveform looks good. Flow 2.8. Co-ox 69%.   PLTs at 40 with plan for platelets now. Hgb 8.2  With plans for blood and PLTs this am.   Ernestine Conrad numbers done personally at bedside with RNs   PA 24/15 (20)  PCWP 14 Thermo 3.4 /2.0  SVR 1520  CO 3.4/2.0   Objective:   Weight Range:  Vital Signs:   Temp:  [98.6 F (37 C)-99.9 F (37.7 C)] 99 F (37.2 C) (04/18 0800) Pulse Rate:  [49-134] 134 (04/18 0700) Resp:  [12-25] 20 (04/18 0800) BP: (83-89)/(60-62) 83/62 mmHg (04/18 0525) SpO2:  [82 %-100 %] 99 % (04/18 0745) Arterial Line BP: (68-113)/(52-71) 84/63 mmHg (04/18 0800) FiO2 (%):  [40 %] 40 % (04/18 0745) Weight:  [167 lb 1.7 oz (75.8 kg)] 167 lb 1.7 oz (75.8 kg) (04/18 0600)    Weight change: Filed Weights   09/16/14 0500 09/17/14 0600 09/18/14 0600  Weight: 166 lb 7.2 oz (75.5 kg) 158 lb 11.7 oz (72 kg) 167 lb 1.7 oz (75.8 kg)    Intake/Output:   Intake/Output Summary (Last 24 hours) at 09/18/14 0903 Last data filed at 09/18/14 0800  Gross per 24 hour  Intake 5114.61 ml  Output   4840 ml  Net 274.61 ml     Physical Exam: General:  Intubated sedated HEENT: ETT Neck: supple.  RIJ swan Carotids 2+ bilat; no bruits. No lymphadenopathy or thryomegaly appreciated. Cor: Sternal dressing. +CTs PMI nondisplaced. Irregular rate & rhythm. Impella hum Lungs: clear Abdomen: soft, nontender, nondistended. No hepatosplenomegaly. No bruits or masses. Hypoactive bowel sounds. Extremities: no cyanosis, clubbing, rash, 2+ edema Impella site no longer oozing Neuro: alert & orientedx3, cranial nerves grossly intact. moves all 4 extremities w/o difficulty. Affect pleasant  Telemetry: AF 130s    Labs: Basic Metabolic Panel:  Recent Labs Lab 09/14/14 0020  09/14/14 0322 09/14/14 1510 09/14/14 1604 09/15/14 0319 09/16/14 0400 09/17/14 0353 09/18/14 0342  NA  --   < > 137  --  131* 129* 128* 130* 129*  K  --   < > 3.2*  --  3.8 3.5 4.5 3.7 3.5  CL  --   < > 103  --  99 100 99 99 100  CO2  --   --  20  --   --  GLUCOSE  --   < > 188*  --  124* 121* 100* 100* 112*  BUN  --   < > 11  --  CREATININE  --   < > 1.24 1.76* 1.50* 1.64* 1.43* 1.35 1.18  CALCIUM  --   --  8.8  --   --  7.4* 7.4* 7.3* 7.1*  MG 1.9  --  1.9 1.7  --  1.6 1.5  --   --   PHOS  --   --   --   --   --  2.9 2.7  --   --   < > = values in this interval not displayed.  Liver Function Tests:  Recent Labs Lab 09/13/14 1400 09/15/14 0319 09/16/14 0400 09/17/14 0353 09/18/14 0342  AST 257*  181* 148* 132* 81*  ALT 32 24 23 19 20   ALKPHOS 23* 36* 52 93 66  BILITOT 1.5* 2.4* 3.6* 4.6* 7.4*  PROT 3.3* 3.7* 3.9* 3.5* 3.5*  ALBUMIN 2.1* 2.2* 1.9* 1.6* 1.5*   No results for input(s): LIPASE, AMYLASE in the last 168 hours. No results for input(s): AMMONIA in the last 168 hours.  CBC:  Recent Labs Lab 09/13/14 0357  09/13/14 1310  09/15/14 1630 09/16/14 0400 09/16/14 1500 09/17/14 0353 09/17/14 1400 09/18/14 0342  WBC 10.8*  --  7.4  < > 12.6* 13.1*  --  12.9* 12.9* 11.8*  NEUTROABS 9.5*  --  7.0  --   --   --   --   --   --   --   HGB 14.7  < > 8.2*  < > 10.8* 11.0* 10.1* 9.1* 8.5* 8.2*  HCT 42.0  < > 23.7*  < > 30.0* 30.3* 27.7* 25.2* 24.1* 23.7*  MCV 101.4*  --  94.4  < > 82.9 81.9  --  83.2 84.3 84.9  PLT 178  < > 81*  < > 101* 72* 57* 85* 67* 40*  < > = values in this interval not displayed.  Cardiac Enzymes:  Recent Labs Lab 09/13/14 1400 09/13/14 1859 09/14/14 0020  TROPONINI >80.00* >80.00* >80.00*    BNP: BNP (last 3 results)  Recent Labs  09/13/14 1310  BNP 96.8    ProBNP (last 3 results) No results for input(s): PROBNP in the last  8760 hours.    Other results:  Imaging: Dg Chest Port 1 View  09/18/2014   CLINICAL DATA:  Status post CABG and closure of patent foramen ovale 5 days ago  EXAM: PORTABLE CHEST - 1 VIEW  COMPARISON:  Portable chest x-ray of September 17, 2014  FINDINGS: The lungs are well-expanded. There is no alveolar infiltrate. There is no pneumothorax or pleural effusion. The bilateral chest tubes are unchanged in position. Minimal bilateral subcutaneous emphysema persists The Swan-Ganz catheter tip projects in the proximal right main pulmonary artery. The Impella device in is in reasonable position radiographically. The feeding tube tip projects below the inferior margin of the image. The endotracheal tube tip lies 5.7 cm above the crotch of the carina.  IMPRESSION: Stable chest x-ray since yesterday's study. There is no pneumonia, pulmonary edema, or pneumothorax. There is underlying COPD. The support tubes and lines are in reasonable position radiographically.   Electronically Signed   By: David  [place]   On: 09/18/2014 07:40   Dg Chest Port 1 View  09/17/2014   CLINICAL DATA:  Bypass surgery.  EXAM: PORTABLE CHEST - 1 VIEW  COMPARISON:  09/16/2014.  FINDINGS: The support apparatus is stable. The Swan-Ganz catheter tip is in the proximal left pulmonary artery. No definite pneumothorax and resolving subcutaneous emphysema bilaterally. Patchy areas of atelectasis but no definite pleural effusion or pulmonary edema.  IMPRESSION: Stable support apparatus.  No definite pneumothorax and resolving subcutaneous emphysema.  Streaky areas of atelectasis but no edema or effusion.   Electronically Signed   By: Rudie Meyer M.D.   On: 09/17/2014 07:18   Dg Abd Portable 1v  09/17/2014   CLINICAL DATA:  74 year old male with a history of feeding tube placement.  EXAM: PORTABLE ABDOMEN - 1 VIEW  COMPARISON:  Chest x-ray 09/17/2014  FINDINGS: Single frontal supine view of the abdomen demonstrates metallic tip enteric feeding tube  with the tip at the stomach pylorus. Coaxial wire has been withdrawn.  Gas  within the stomach.  Transcutaneous pacing leads project over the upper abdomen as well as thoracostomy tubes.  Paucity of small bowel gas.  No acute fracture identified.  IMPRESSION: Metallic tip feeding tube terminates at the pylorus of the stomach.  Support apparatus project over the upper abdomen, in this patient status post CABG.  Signed,  Yvone Neu. Loreta Ave, DO  [facility]  [facility]   Electronically Signed   By: Gilmer Mor D.O.   On: 09/17/2014 11:39     Medications:     Scheduled Medications: . antiseptic oral rinse  7 mL Mouth Rinse QID  . aspirin EC  325 mg Oral Daily   Or  . aspirin  324 mg Per Tube Daily  . atorvastatin  40 mg Oral q1800  . bisacodyl  10 mg Oral Daily   Or  . bisacodyl  10 mg Rectal Daily  . budesonide (PULMICORT) nebulizer solution  0.5 mg Nebulization BID  . chlorhexidine  15 mL Mouth Rinse BID  . digoxin  0.125 mg Intravenous Daily  . docusate sodium  200 mg Oral Daily  . insulin aspart  0-9 Units Subcutaneous 6 times per day  . insulin detemir  15 Units Subcutaneous BID  . ipratropium-albuterol  3 mL Nebulization Q6H  . pantoprazole (PROTONIX) IV  40 mg Intravenous Q24H  . potassium chloride  10 mEq Intravenous Once  . sodium chloride  3 mL Intravenous Q12H    Infusions: . sodium chloride 10 mL/hr at 09/18/14 0800  . sodium chloride 250 mL (09/14/14 0611)  . sodium chloride Stopped (09/15/14 1600)  . amiodarone 30 mg/hr (09/18/14 0800)  . dexmedetomidine 1.199 mcg/kg/hr (09/18/14 0800)  . DOPamine 4.967 mcg/kg/min (09/18/14 0800)  . EPINEPHrine 4 mg in dextrose 5% 250 mL infusion (16 mcg/mL) 20 mcg/min (09/18/14 0800)  . feeding supplement (VITAL 1.5 CAL)    . impella catheter heparin 50 unit/mL in dextrose 5%    . heparin 900 Units/hr (09/18/14 0800)  . lactated ringers 20 mL/hr at 09/18/14 0800  . lactated ringers 20  mL/hr at 09/18/14 0800  . lidocaine Stopped (09/15/14 0800)  . milrinone 0.375 mcg/kg/min (09/18/14 0800)  . nitroGLYCERIN Stopped (09/13/14 1315)  . norepinephrine (LEVOPHED) Adult infusion Stopped (09/16/14 1000)    PRN Medications: sodium chloride, albumin human, levalbuterol, metoprolol, midazolam, morphine injection, nitroGLYCERIN, ondansetron (ZOFRAN) IV, oxyCODONE, sodium chloride, traMADol   Assessment:   1. Cardiogenic shock    -Impella CP in place 2. Acute systolic HF EF 25-30% by TEE 4/15 3. Acute anterolateral STEMI    --Emergent CABG 4/13 4. Acute respiratory failure 5. Thrombocytopenia 6. Acute renal failure, stage III 7. Hyponatremia 8. PAF with RVR  Plan/Discussion:    In A fib this morning. On amio drip.  Remains on epi 20 mcg, Milrinone 0.375 mcg, and dopa 5 mcg.    Impella waveform looks good.   Check CBC and T/S at 2pm.    The patient is critically ill with multiple organ systems failure and requires high complexity decision making for assessment and support, frequent evaluation and titration of therapies, application of advanced monitoring technologies and extensive interpretation of multiple databases.   Critical Care Time devoted to patient care services described in this note is 35 Minutes.   Length of Stay: 5   CLEGG,AMY NP-C  09/18/2014, 9:03 AM  Advanced Heart Failure Team Pager [phone] (M-F; 7a - 4p)  Please contact [facility] for night-coverage after hours (4p -7a ) and  weekends on [email or web address]  Patient seen and examined with Tonye BecketAmy Clegg, NP. We discussed all aspects of the encounter. I agree with the assessment and plan as stated above.   Hemodynamics worse this am due to AF. Will need to restore NSR. Give amio 300. Continue amio gtt. Impella waveform stable. Agree with blood and platelets.  Wean epi as tolerated. Can diurese gently after blood products. Remains very tenuous.   The patient is critically ill with multiple organ systems  failure and requires high complexity decision making for assessment and support, frequent evaluation and titration of therapies, application of advanced monitoring technologies and extensive interpretation of multiple databases.   Critical Care Time personally devoted to patient care services described in this note is 35 Minutes.  Daniel Bensimhon,MD 9:13 AM

## 2014-09-19 ENCOUNTER — Inpatient Hospital Stay (HOSPITAL_COMMUNITY): Payer: Medicare Other

## 2014-09-19 ENCOUNTER — Encounter (HOSPITAL_COMMUNITY)
Admission: EM | Disposition: A | Discharge status: Nursing Home (Includes ICF) | Payer: Self-pay | Source: Home / Self Care | Attending: Cardiothoracic Surgery

## 2014-09-19 ENCOUNTER — Inpatient Hospital Stay (HOSPITAL_COMMUNITY): Payer: Medicare Other | Admitting: Certified Registered Nurse Anesthetist

## 2014-09-19 LAB — COMPREHENSIVE METABOLIC PANEL
ALT: 24 U/L (ref 0–53)
AST: 96 U/L — ABNORMAL HIGH (ref 0–37)
Albumin: 1.9 g/dL — ABNORMAL LOW (ref 3.5–5.2)
Alkaline Phosphatase: 80 U/L (ref 39–117)
Anion gap: 6 (ref 5–15)
BUN: 11 mg/dL (ref 6–23)
CO2: 23 mmol/L (ref 19–32)
Calcium: 7.3 mg/dL — ABNORMAL LOW (ref 8.4–10.5)
Chloride: 103 mmol/L (ref 96–112)
Creatinine, Ser: 1.03 mg/dL (ref 0.50–1.35)
GFR calc Af Amer: 82 mL/min — ABNORMAL LOW (ref >90)
GFR calc non Af Amer: 71 mL/min — ABNORMAL LOW (ref >90)
Glucose, Bld: 127 mg/dL — ABNORMAL HIGH (ref 70–99)
Potassium: 3.8 mmol/L (ref 3.5–5.1)
Sodium: 132 mmol/L — ABNORMAL LOW (ref 135–145)
Total Bilirubin: 12.1 mg/dL — ABNORMAL HIGH (ref 0.3–1.2)
Total Protein: 4.1 g/dL — ABNORMAL LOW (ref 6.0–8.3)

## 2014-09-19 LAB — POCT I-STAT 3, ART BLOOD GAS (G3+)
ACID-BASE DEFICIT: 2 mmol/L (ref 0.0–2.0)
ACID-BASE DEFICIT: 2 mmol/L (ref 0.0–2.0)
ACID-BASE DEFICIT: 3 mmol/L — AB (ref 0.0–2.0)
Acid-base deficit: 3 mmol/L — ABNORMAL HIGH (ref 0.0–2.0)
Acid-base deficit: 2 mmol/L (ref 0.0–2.0)
BICARBONATE: 23.2 meq/L (ref 20.0–24.0)
BICARBONATE: 24.8 meq/L — AB (ref 20.0–24.0)
BICARBONATE: 22.1 meq/L (ref 20.0–24.0)
Bicarbonate: 22.1 mEq/L (ref 20.0–24.0)
Bicarbonate: 24 mEq/L (ref 20.0–24.0)
O2 SAT: 85 %
O2 SAT: 96 %
O2 Saturation: 95 %
O2 Saturation: 100 %
O2 Saturation: 99 %
PCO2 ART: 46.9 mmHg — AB (ref 35.0–45.0)
PH ART: 7.288 — AB (ref 7.350–7.450)
PH ART: 7.321 — AB (ref 7.350–7.450)
PO2 ART: 85 mmHg (ref 80.0–100.0)
Patient temperature: 36.4
Patient temperature: 37.8
TCO2: 23 mmol/L (ref 0–100)
TCO2: 23 mmol/L (ref 0–100)
TCO2: 25 mmol/L (ref 0–100)
TCO2: 25 mmol/L (ref 0–100)
TCO2: 27 mmol/L (ref 0–100)
pCO2 arterial: 35.6 mmHg (ref 35.0–45.0)
pCO2 arterial: 37 mmHg (ref 35.0–45.0)
pCO2 arterial: 48.9 mmHg — ABNORMAL HIGH (ref 35.0–45.0)
pCO2 arterial: 59.1 mmHg (ref 35.0–45.0)
pH, Arterial: 7.236 — ABNORMAL LOW (ref 7.350–7.450)
pH, Arterial: 7.387 (ref 7.350–7.450)
pH, Arterial: 7.398 (ref 7.350–7.450)
pO2, Arterial: 149 mmHg — ABNORMAL HIGH (ref 80.0–100.0)
pO2, Arterial: 189 mmHg — ABNORMAL HIGH (ref 80.0–100.0)
pO2, Arterial: 49 mmHg — ABNORMAL LOW (ref 80.0–100.0)
pO2, Arterial: 91 mmHg (ref 80.0–100.0)

## 2014-09-19 LAB — POCT I-STAT, CHEM 8
BUN: 13 mg/dL (ref 6–23)
BUN: 14 mg/dL (ref 6–23)
BUN: 17 mg/dL (ref 6–23)
CALCIUM ION: 1.13 mmol/L (ref 1.13–1.30)
CALCIUM ION: 1.1 mmol/L — AB (ref 1.13–1.30)
Calcium, Ion: 1.09 mmol/L — ABNORMAL LOW (ref 1.13–1.30)
Chloride: 96 mmol/L (ref 96–112)
Chloride: 98 mmol/L (ref 96–112)
Chloride: 99 mmol/L (ref 96–112)
Creatinine, Ser: 1 mg/dL (ref 0.50–1.35)
Creatinine, Ser: 1.1 mg/dL (ref 0.50–1.35)
Creatinine, Ser: 1.2 mg/dL (ref 0.50–1.35)
GLUCOSE: 85 mg/dL (ref 70–99)
Glucose, Bld: 131 mg/dL — ABNORMAL HIGH (ref 70–99)
Glucose, Bld: 195 mg/dL — ABNORMAL HIGH (ref 70–99)
HCT: 26 % — ABNORMAL LOW (ref 39.0–52.0)
HEMATOCRIT: 34 % — AB (ref 39.0–52.0)
HEMATOCRIT: 30 % — AB (ref 39.0–52.0)
HEMOGLOBIN: 10.2 g/dL — AB (ref 13.0–17.0)
Hemoglobin: 11.6 g/dL — ABNORMAL LOW (ref 13.0–17.0)
Hemoglobin: 8.8 g/dL — ABNORMAL LOW (ref 13.0–17.0)
POTASSIUM: 4.1 mmol/L (ref 3.5–5.1)
POTASSIUM: 4.6 mmol/L (ref 3.5–5.1)
Potassium: 3.7 mmol/L (ref 3.5–5.1)
Sodium: 129 mmol/L — ABNORMAL LOW (ref 135–145)
Sodium: 130 mmol/L — ABNORMAL LOW (ref 135–145)
Sodium: 134 mmol/L — ABNORMAL LOW (ref 135–145)
TCO2: 20 mmol/L (ref 0–100)
TCO2: 21 mmol/L (ref 0–100)
TCO2: 23 mmol/L (ref 0–100)

## 2014-09-19 LAB — CARBOXYHEMOGLOBIN
Carboxyhemoglobin: 1 % (ref 0.5–1.5)
Methemoglobin: 0.9 % (ref 0.0–1.5)
O2 Saturation: 62.4 %
Total hemoglobin: 12.2 g/dL — ABNORMAL LOW (ref 13.5–18.0)

## 2014-09-19 LAB — POCT ACTIVATED CLOTTING TIME
ACTIVATED CLOTTING TIME: 153 s
ACTIVATED CLOTTING TIME: 159 s
ACTIVATED CLOTTING TIME: 165 s
ACTIVATED CLOTTING TIME: 165 s
ACTIVATED CLOTTING TIME: 177 s
Activated Clotting Time: 153 seconds

## 2014-09-19 LAB — GLUCOSE, CAPILLARY
GLUCOSE-CAPILLARY: 101 mg/dL — AB (ref 70–99)
GLUCOSE-CAPILLARY: 118 mg/dL — AB (ref 70–99)
GLUCOSE-CAPILLARY: 126 mg/dL — AB (ref 70–99)
GLUCOSE-CAPILLARY: 111 mg/dL — AB (ref 70–99)
Glucose-Capillary: 138 mg/dL — ABNORMAL HIGH (ref 70–99)
Glucose-Capillary: 189 mg/dL — ABNORMAL HIGH (ref 70–99)

## 2014-09-19 LAB — PREPARE PLATELET PHERESIS: Unit division: 0

## 2014-09-19 LAB — PREPARE RBC (CROSSMATCH)

## 2014-09-19 LAB — LACTATE DEHYDROGENASE: LDH: 1161 U/L — ABNORMAL HIGH (ref 94–250)

## 2014-09-19 LAB — MAGNESIUM: MAGNESIUM: 1.7 mg/dL (ref 1.5–2.5)

## 2014-09-19 LAB — PHOSPHORUS: Phosphorus: 3.1 mg/dL (ref 2.3–4.6)

## 2014-09-19 SURGERY — REMOVAL, CARDIAC ASSIST DEVICE, IMPELLA
Anesthesia: General | Site: Chest

## 2014-09-19 MED ORDER — FUROSEMIDE 10 MG/ML IJ SOLN
40.0000 mg | Freq: Once | INTRAMUSCULAR | Status: AC
  Administered 2014-09-19: 40 mg via INTRAVENOUS
  Filled 2014-09-19: qty 4

## 2014-09-19 MED ORDER — POTASSIUM CHLORIDE 10 MEQ/50ML IV SOLN
10.0000 meq | INTRAVENOUS | Status: AC
  Administered 2014-09-19 (×3): 10 meq via INTRAVENOUS
  Filled 2014-09-19 (×3): qty 50

## 2014-09-19 MED ORDER — AMIODARONE IV BOLUS ONLY 150 MG/100ML
150.0000 mg | Freq: Once | INTRAVENOUS | Status: AC
  Administered 2014-09-19: 150 mg via INTRAVENOUS

## 2014-09-19 MED ORDER — SODIUM CHLORIDE 0.9 % IV SOLN: Freq: Once | INTRAVENOUS | Status: DC

## 2014-09-19 MED ORDER — FENTANYL CITRATE (PF) 100 MCG/2ML IJ SOLN
INTRAMUSCULAR | Status: AC
  Filled 2014-09-19: qty 2

## 2014-09-19 MED ORDER — DEXTROSE 5 % IV SOLN
300.0000 mg | Freq: Once | INTRAVENOUS | Status: AC
  Administered 2014-09-19: 300 mg via INTRAVENOUS

## 2014-09-19 MED ORDER — ALBUMIN HUMAN 5 % IV SOLN
12.5000 g | Freq: Once | INTRAVENOUS | Status: AC
  Administered 2014-09-19: 12.5 g via INTRAVENOUS

## 2014-09-19 MED ORDER — VANCOMYCIN HCL IN DEXTROSE 750-5 MG/150ML-% IV SOLN
750.0000 mg | Freq: Two times a day (BID) | INTRAVENOUS | Status: DC
  Administered 2014-09-19 – 2014-09-20 (×4): 750 mg via INTRAVENOUS
  Filled 2014-09-19 (×6): qty 150

## 2014-09-19 MED ORDER — NOREPINEPHRINE BITARTRATE 1 MG/ML IV SOLN
0.0000 ug/min | INTRAVENOUS | Status: DC
  Administered 2014-09-19: 1 ug/min via INTRAVENOUS
  Administered 2014-09-19: 35 ug/min via INTRAVENOUS
  Administered 2014-09-20 – 2014-09-22 (×10): 40 ug/min via INTRAVENOUS
  Administered 2014-09-23: 34 ug/min via INTRAVENOUS
  Administered 2014-09-23: 24 ug/min via INTRAVENOUS
  Administered 2014-09-24 (×2): 22 ug/min via INTRAVENOUS
  Administered 2014-09-25: 20 ug/min via INTRAVENOUS
  Administered 2014-09-26: 17 ug/min via INTRAVENOUS
  Administered 2014-09-26: 24 ug/min via INTRAVENOUS
  Administered 2014-09-27: 20 ug/min via INTRAVENOUS
  Administered 2014-09-27: 24 ug/min via INTRAVENOUS
  Administered 2014-09-28: 18 ug/min via INTRAVENOUS
  Administered 2014-09-30: 1 ug/min via INTRAVENOUS
  Filled 2014-09-19 (×30): qty 16

## 2014-09-19 MED ORDER — POTASSIUM CHLORIDE 10 MEQ/50ML IV SOLN
10.0000 meq | INTRAVENOUS | Status: AC
  Administered 2014-09-19 (×3): 10 meq via INTRAVENOUS
  Filled 2014-09-19: qty 50

## 2014-09-19 MED ORDER — DILTIAZEM HCL 100 MG IV SOLR
5.0000 mg/h | INTRAVENOUS | Status: DC
  Filled 2014-09-19: qty 100

## 2014-09-19 MED ORDER — SODIUM CHLORIDE 0.9 % IV SOLN
Freq: Once | INTRAVENOUS | Status: AC
  Administered 2014-09-19: 02:00:00 via INTRAVENOUS

## 2014-09-19 MED ORDER — ALBUMIN HUMAN 5 % IV SOLN
12.5000 g | Freq: Once | INTRAVENOUS | Status: AC
  Administered 2014-09-19: 12.5 g via INTRAVENOUS
  Filled 2014-09-19: qty 250

## 2014-09-19 MED ORDER — DIGOXIN 0.25 MG/ML IJ SOLN
0.1250 mg | Freq: Once | INTRAMUSCULAR | Status: DC
  Filled 2014-09-19 (×2): qty 0.5

## 2014-09-19 MED ORDER — SODIUM CHLORIDE 0.9 % IV SOLN
Freq: Once | INTRAVENOUS | Status: DC
  Administered 2014-09-19: 13:00:00 via INTRAVENOUS

## 2014-09-19 MED ORDER — FUROSEMIDE 10 MG/ML IJ SOLN
4.0000 mg/h | INTRAVENOUS | Status: DC
  Administered 2014-09-19: 4 mg/h via INTRAVENOUS
  Administered 2014-09-20: 6 mg/h via INTRAVENOUS
  Administered 2014-09-21: 4 mg/h via INTRAVENOUS
  Filled 2014-09-19 (×5): qty 25

## 2014-09-19 SURGICAL SUPPLY — 84 items
ADAPTER CARDIO PERF ANTE/RETRO (ADAPTER) ×4 IMPLANT
ATTRACTOMAT 16X20 MAGNETIC DRP (DRAPES) ×4 IMPLANT
BAG DECANTER FOR FLEXI CONT (MISCELLANEOUS) ×4 IMPLANT
BANDAGE ELASTIC 4 VELCRO ST LF (GAUZE/BANDAGES/DRESSINGS) ×4 IMPLANT
BANDAGE ELASTIC 6 VELCRO ST LF (GAUZE/BANDAGES/DRESSINGS) ×4 IMPLANT
BASKET HEART  (ORDER IN 25'S) (MISCELLANEOUS) ×1
BASKET HEART (ORDER IN 25'S) (MISCELLANEOUS) ×1
BASKET HEART (ORDER IN 25S) (MISCELLANEOUS) ×2 IMPLANT
BLADE STERNUM SYSTEM 6 (BLADE) ×4 IMPLANT
BLADE SURG 12 STRL SS (BLADE) ×4 IMPLANT
BLADE SURG ROTATE 9660 (MISCELLANEOUS) IMPLANT
BNDG GAUZE ELAST 4 BULKY (GAUZE/BANDAGES/DRESSINGS) ×4 IMPLANT
CANISTER SUCTION 2500CC (MISCELLANEOUS) ×4 IMPLANT
CANNULA GUNDRY RCSP 15FR (MISCELLANEOUS) ×4 IMPLANT
CATH CPB KIT VANTRIGT (MISCELLANEOUS) ×4 IMPLANT
CATH ROBINSON RED A/P 18FR (CATHETERS) ×12 IMPLANT
CATH THORACIC 36FR RT ANG (CATHETERS) ×4 IMPLANT
COVER SURGICAL LIGHT HANDLE (MISCELLANEOUS) ×4 IMPLANT
CRADLE DONUT ADULT HEAD (MISCELLANEOUS) ×4 IMPLANT
DRAIN CHANNEL 32F RND 10.7 FF (WOUND CARE) ×4 IMPLANT
DRAPE CARDIOVASCULAR INCISE (DRAPES) ×2
DRAPE SLUSH/WARMER DISC (DRAPES) ×4 IMPLANT
DRAPE SRG 135X102X78XABS (DRAPES) ×2 IMPLANT
DRSG AQUACEL AG ADV 3.5X14 (GAUZE/BANDAGES/DRESSINGS) ×4 IMPLANT
ELECT BLADE 4.0 EZ CLEAN MEGAD (MISCELLANEOUS) ×4
ELECT BLADE 6.5 EXT (BLADE) ×4 IMPLANT
ELECT CAUTERY BLADE 6.4 (BLADE) ×4 IMPLANT
ELECT REM PT RETURN 9FT ADLT (ELECTROSURGICAL) ×8
ELECTRODE BLDE 4.0 EZ CLN MEGD (MISCELLANEOUS) ×2 IMPLANT
ELECTRODE REM PT RTRN 9FT ADLT (ELECTROSURGICAL) ×4 IMPLANT
GAUZE SPONGE 4X4 12PLY STRL (GAUZE/BANDAGES/DRESSINGS) ×8 IMPLANT
GLOVE BIO SURGEON STRL SZ7.5 (GLOVE) ×12 IMPLANT
GOWN STRL REUS W/ TWL LRG LVL3 (GOWN DISPOSABLE) ×8 IMPLANT
GOWN STRL REUS W/TWL LRG LVL3 (GOWN DISPOSABLE) ×8
HEMOSTAT POWDER SURGIFOAM 1G (HEMOSTASIS) ×12 IMPLANT
HEMOSTAT SURGICEL 2X14 (HEMOSTASIS) ×4 IMPLANT
INSERT FOGARTY XLG (MISCELLANEOUS) IMPLANT
KIT BASIN OR (CUSTOM PROCEDURE TRAY) ×4 IMPLANT
KIT ROOM TURNOVER OR (KITS) ×4 IMPLANT
KIT SUCTION CATH 14FR (SUCTIONS) ×4 IMPLANT
KIT VASOVIEW W/TROCAR VH 2000 (KITS) ×4 IMPLANT
LEAD PACING MYOCARDI (MISCELLANEOUS) ×4 IMPLANT
MARKER GRAFT CORONARY BYPASS (MISCELLANEOUS) ×12 IMPLANT
NS IRRIG 1000ML POUR BTL (IV SOLUTION) ×20 IMPLANT
PACK OPEN HEART (CUSTOM PROCEDURE TRAY) ×4 IMPLANT
PAD ARMBOARD 7.5X6 YLW CONV (MISCELLANEOUS) ×8 IMPLANT
PAD ELECT DEFIB RADIOL ZOLL (MISCELLANEOUS) ×4 IMPLANT
PENCIL BUTTON HOLSTER BLD 10FT (ELECTRODE) ×4 IMPLANT
PUNCH AORTIC ROTATE 4.0MM (MISCELLANEOUS) IMPLANT
PUNCH AORTIC ROTATE 4.5MM 8IN (MISCELLANEOUS) IMPLANT
PUNCH AORTIC ROTATE 5MM 8IN (MISCELLANEOUS) IMPLANT
SURGIFLO W/THROMBIN 8M KIT (HEMOSTASIS) ×4 IMPLANT
SUT BONE WAX W31G (SUTURE) ×4 IMPLANT
SUT MNCRL AB 4-0 PS2 18 (SUTURE) IMPLANT
SUT PROLENE 3 0 SH DA (SUTURE) IMPLANT
SUT PROLENE 3 0 SH1 36 (SUTURE) IMPLANT
SUT PROLENE 4 0 RB 1 (SUTURE) ×2
SUT PROLENE 4 0 SH DA (SUTURE) ×4 IMPLANT
SUT PROLENE 4-0 RB1 .5 CRCL 36 (SUTURE) ×2 IMPLANT
SUT PROLENE 5 0 C 1 36 (SUTURE) IMPLANT
SUT PROLENE 6 0 C 1 30 (SUTURE) IMPLANT
SUT PROLENE 6 0 CC (SUTURE) ×12 IMPLANT
SUT PROLENE 8 0 BV175 6 (SUTURE) IMPLANT
SUT PROLENE BLUE 7 0 (SUTURE) ×4 IMPLANT
SUT SILK  1 MH (SUTURE)
SUT SILK 1 MH (SUTURE) IMPLANT
SUT SILK 2 0 SH CR/8 (SUTURE) IMPLANT
SUT SILK 3 0 SH CR/8 (SUTURE) IMPLANT
SUT STEEL 6MS V (SUTURE) ×8 IMPLANT
SUT STEEL SZ 6 DBL 3X14 BALL (SUTURE) ×4 IMPLANT
SUT VIC AB 1 CTX 36 (SUTURE) ×4
SUT VIC AB 1 CTX36XBRD ANBCTR (SUTURE) ×4 IMPLANT
SUT VIC AB 2-0 CT1 27 (SUTURE)
SUT VIC AB 2-0 CT1 TAPERPNT 27 (SUTURE) IMPLANT
SUT VIC AB 2-0 CTX 27 (SUTURE) IMPLANT
SUT VIC AB 3-0 X1 27 (SUTURE) IMPLANT
SUTURE E-PAK OPEN HEART (SUTURE) ×4 IMPLANT
SYSTEM SAHARA CHEST DRAIN ATS (WOUND CARE) ×4 IMPLANT
TOWEL OR 17X24 6PK STRL BLUE (TOWEL DISPOSABLE) ×8 IMPLANT
TOWEL OR 17X26 10 PK STRL BLUE (TOWEL DISPOSABLE) ×8 IMPLANT
TRAY FOLEY IC TEMP SENS 16FR (CATHETERS) ×4 IMPLANT
TUBING INSUFFLATION (TUBING) ×4 IMPLANT
UNDERPAD 30X30 INCONTINENT (UNDERPADS AND DIAPERS) ×4 IMPLANT
WATER STERILE IRR 1000ML POUR (IV SOLUTION) ×8 IMPLANT

## 2014-09-19 NOTE — Progress Notes (Signed)
Patient HR trying to convert to atrial fib with SBP dropping into 70's. Converted back to SR once but flipping back into atrial fib. Dr. Gala RomneyBensimhon paged. Franki CabotBrianna Kaleya Douse, RN

## 2014-09-19 NOTE — Progress Notes (Signed)
ANTIBIOTIC CONSULT NOTE - INITIAL  Pharmacy Consult for vancomycin    Indication: rule out pneumonia and leukocytosis  Allergies  Allergen Reactions  . Penicillins Nausea And Vomiting    Patient Measurements: Height: 5\' 7"  (170.2 cm) Weight: 167 lb 1.7 oz (75.8 kg) IBW/kg (Calculated) : 66.1 Adjusted Body Weight: n/a  Vital Signs: Temp: 100 F (37.8 C) (04/19 0715) Temp Source: Core (Comment) (04/19 0800) BP: 95/59 mmHg (04/19 0900) Pulse Rate: 102 (04/19 0715) Intake/Output from previous day: 04/18 0701 - 04/19 0700 In: 5851.8 [I.V.:3510.8; Blood:855; NG/GT:562.5; IV Piggyback:550] Out: 4330 [Urine:3970; Chest Tube:360] Intake/Output from this shift: Total I/O In: 244.9 [I.V.:121; Other:13.9; NG/GT:60; IV Piggyback:50] Out: 330 [Urine:310; Chest Tube:20]  Labs:  Recent Labs  09/17/14 1400 09/18/14 0342 09/18/14 1525 09/19/14 0013 09/19/14 0500  WBC 12.9* 11.8*  --   --  14.4*  HGB 8.5* 8.2* 9.5* 8.8* 10.8*  PLT 67* 40*  --   --  54*  CREATININE  --  1.18 1.10 1.00 1.03   Estimated Creatinine Clearance: 61.5 mL/min (by C-G formula based on Cr of 1.03). No results for input(s): VANCOTROUGH, VANCOPEAK, VANCORANDOM, GENTTROUGH, GENTPEAK, GENTRANDOM, TOBRATROUGH, TOBRAPEAK, TOBRARND, AMIKACINPEAK, AMIKACINTROU, AMIKACIN in the last 72 hours.   Microbiology: Recent Results (from the past 720 hour(s))  MRSA PCR Screening     Status: None   Collection Time: 09/13/14  4:51 PM  Result Value Ref Range Status   MRSA by PCR NEGATIVE NEGATIVE Final    Comment:        The GeneXpert MRSA Assay (FDA approved for NASAL specimens only), is one component of a comprehensive MRSA colonization surveillance program. It is not intended to diagnose MRSA infection nor to guide or monitor treatment for MRSA infections.     Medical History: Past Medical History  Diagnosis Date  . Dyspnea   . Palpitations   . Anxiety and depression   . GERD (gastroesophageal reflux  disease)   . Anxiety   . Depression   . Hypertension   . Hypercholesteremia    Assessment: 74 yo male on Impella, on vent with rising WBC.  Pharmacy asked to begin empiric therapy with vancomycin.  Scr stable, est CrCl ~ 60 ml/min.  Goal of Therapy:  Vancomycin trough level 15-20 mcg/ml  Plan:  1. Vancomycin 750 mg IV q 12 hrs. 2. F/u WBC, clinical course.  Tad MooreJessica Tadeo Besecker, Pharm D, BCPS  Clinical Pharmacist Pager [phone]  09/19/2014 9:28 AM

## 2014-09-19 NOTE — Progress Notes (Signed)
Patient desating into 1470s on PRVC 50/14/5. Respiratory rate in the 30s; asynchronous with ventilator. All of a sudden he stopped fighting ventilator, head fell back on pillow, eyes wide open, gazing up and to the left. Not responding to commands. Sats maintain in low 80s, guppy breaths noted. Upon ausculation, lungs tight, rhonchus, and diminished. Respiratory bagged and lavaged patient to help with sats. Clot material came up with repeated suctioning.  BP dropped as low as 60s systolic. Dr. Laneta SimmersBartle notified of status. Order received for stat chest xray, albumin, ABG, and Levophed gtt. Will continue to closely monitor. Thresa RossA. Maymuna Detzel RN

## 2014-09-19 NOTE — Progress Notes (Signed)
MD at bedside as weaning of Impella initiated. Down to P-7 at 0800. Ordered to keep patient sedated. Titrated Versed IV and now on 4/hr. A total of 4mg  IV PRN Morphine administered as well per MD order. Patient is able to open eyes to verbal stimuli but is now tolerating the ventilator and does not appear to in any distress. No current change in hemodynamics. Called MD to check about change in Heparin dose with ACT of 153. Informed to that he only wants patient to be on a max of 900u/hr of Heparin. No change made to drip. Will admnister 1 unit PLT at noon prior to return to OR for removal of Impella. Will continue to monitor. Franki CabotBrianna Armany Mano, RN

## 2014-09-19 NOTE — CV Procedure (Signed)
     DIRECT CURRENT CARDIOVERSION  NAME:  [name]   MRN: [id number] DOB:  1941/01/29   ADMIT DATE: 09/13/2014   INDICATIONS: Atrial fibrillation with hemodynamic compromise   PROCEDURE:   Informed consent was obtained from family prior to the procedure. Once an appropriate time out was taken, the patient had the defibrillator pads placed in the anterior and posterior position. The patient then underwent sedation by the anesthesia service. Once an appropriate level of sedation was achieved, the patient received a single biphasic, synchronized 150J shock with prompt conversion to sinus rhythm. No apparent complications.  Candie Gintz,MD 1:39 PM

## 2014-09-19 NOTE — Progress Notes (Signed)
At 2300, weaned Impella from P-7 to P-6 per order. About 40 min after change, SBP noted to be sustained 75-82 with a MAP 58-60. Respirations increased into 30s. Audible wheezing heard. Upon auscultation, limited air movement heard. O2 sats dipping into low 90s, with guppy breathing noted. Stat Xopenex treatment administered. ABG drawn (PO2 on ABG 49, with a sat of 85%, increased FiO2 to 70%) and stat portable chest obtained.  SBP dropped as low as 58. HR in low 100s. Epinephrine drip titrated up to 20 (5512mcg-14mcg-16mcg-20mcg) until SBP came back up to 90s. Impella titrated back to P8 therapy.  CVP up to 16. PA pressures 30s/low teens.   0100- Spoke with Dr. Laneta SimmersBartle regarding patient's status and changes made. Order received to give 1 unit PRBC and to leave Impella at P-8 for remainder of night. Will continue to closely monitor. Thresa RossA. Louisa Favaro RN

## 2014-09-19 NOTE — Progress Notes (Signed)
Paged Dr. Donata ClayVan Trigt when unable to contact Dr. Gala RomneyBensimhon. RN informed the OR RN about the patient condition and was told that she would call back once speaking with Dr. Donata ClayVan Trigt. Dr. Kittie PlaterBensimhom then called back and gave another order for a 150mg  bolus of Amiodarone. While still infusing, OR RN called back to see if RN have ever reached Dr. Gala RomneyBensimhon, in which she was informed that new order have been received from him. She then asked if she needed to notify Dr. Donata ClayVan Trigt about the incident. RN told her that she would be called back if atrial fibrillation does not resolve. Franki CabotBrianna Kolby Schara, RN

## 2014-09-19 NOTE — Progress Notes (Signed)
Spoke with Dr. Tyrone SageGerhardt regarding patient's blood pressure sustaining in the 80s on Levophed @40mcg . CVP 6.  Lasix drip at 4 with excellent urine output. Order to decrease Lasix drip to 2mg /hr, and to give 1 albumin. Will continue to closely monitor. Thresa RossA. Mareena Cavan RN

## 2014-09-19 NOTE — Progress Notes (Signed)
Dr. Donata ClayVan Trigt ordered for Cardiologist to cardiovert patient for sustained atrial fib into the 130's. Dr. Gala RomneyBensimhon paged and informed. He agreed to do cardioversion at bedside and ordered give another 150mg  bolus of Amiodarone, and to increased hourly rate to 60mg . Will continue to monitor. Franki CabotBrianna Naveyah Iacovelli, RN

## 2014-09-19 NOTE — Progress Notes (Signed)
Advanced Heart Failure Rounding Note   Subjective:    Currently sedated but responds to commands when awake.   Back in NSR on amio. Last night Imprella turned down slightly and went into pulmonary edema. Weight up 30 pounds. Now on Epi 16. Milrinone 0.375  Impella waveform looks good. Flow 2.6. Co-ox 62%. Continues to get platelets. No active bleeding.   Tentative plan to remove Impella today   Swan numbers done personally at bedside with RNs  CVP 9  PA 24/15 (20)  PCWP 13 Thermo 4.7 /2.8     Objective:   Weight Range:  Vital Signs:   Temp:  [97 F (36.1 C)-100.4 F (38 C)] 100 F (37.8 C) (04/19 0715) Pulse Rate:  [25-105] 102 (04/19 0715) Resp:  [16-31] 31 (04/19 0715) BP: (87-95)/(57-59) 95/59 mmHg (04/19 0900) SpO2:  [79 %-100 %] 100 % (04/19 0745) Arterial Line BP: (83-116)/(58-72) 92/62 mmHg (04/18 1600) FiO2 (%):  [40 %-100 %] 60 % (04/19 0800)    Weight change: Filed Weights   09/16/14 0500 09/17/14 0600 09/18/14 0600  Weight: 75.5 kg (166 lb 7.2 oz) 72 kg (158 lb 11.7 oz) 75.8 kg (167 lb 1.7 oz)    Intake/Output:   Intake/Output Summary (Last 24 hours) at 09/19/14 0906 Last data filed at 09/19/14 0900  Gross per 24 hour  Intake   5598 ml  Output   4365 ml  Net   1233 ml     Physical Exam: General:  Intubated sedated HEENT: ETT Neck: supple.  RIJ swan Carotids 2+ bilat; no bruits. No lymphadenopathy or thryomegaly appreciated. Cor: Sternal dressing. +CTs PMI nondisplaced. Irregular rate & rhythm. Impella hum Lungs: clear Abdomen: soft, nontender, nondistended. No hepatosplenomegaly. No bruits or masses. Hypoactive bowel sounds. Extremities: no cyanosis, clubbing, rash, 2+ edema Impella site no longer oozing Neuro: alert & orientedx3, cranial nerves grossly intact. moves all 4 extremities w/o difficulty. Affect pleasant  Telemetry: NSR 90s  Labs: Basic Metabolic Panel:  Recent Labs Lab 09/14/14 0322 09/14/14 1510  09/15/14 0319  09/16/14 0400 09/17/14 0353 09/18/14 0342 09/18/14 1525 09/19/14 0013 09/19/14 0500  NA 137  --   < > 129* 128* 130* 129* 130* 129* 132*  K 3.2*  --   < > 3.5 4.5 3.7 3.5 3.8 4.6 3.8  CL 103  --   < > 100 99 99 100 97 98 103  CO2 20  --   --  21 21 23 23   --   --  23  GLUCOSE 188*  --   < > 121* 100* 100* 112* 102* 131* 127*  BUN 11  --   < > 17 13 13 12 12 17 11   CREATININE 1.24 1.76*  < > 1.64* 1.43* 1.35 1.18 1.10 1.00 1.03  CALCIUM 8.8  --   --  7.4* 7.4* 7.3* 7.1*  --   --  7.3*  MG 1.9 1.7  --  1.6 1.5  --   --   --   --  1.7  PHOS  --   --   --  2.9 2.7  --   --   --   --  3.1  < > = values in this interval not displayed.  Liver Function Tests:  Recent Labs Lab 09/15/14 0319 09/16/14 0400 09/17/14 0353 09/18/14 0342 09/19/14 0500  AST 181* 148* 132* 81* 96*  ALT 24 23 19 20 24   ALKPHOS 36* 52 93 66 80  BILITOT 2.4* 3.6* 4.6* 7.4* 12.1*  PROT 3.7* 3.9* 3.5* 3.5* 4.1*  ALBUMIN 2.2* 1.9* 1.6* 1.5* 1.9*   No results for input(s): LIPASE, AMYLASE in the last 168 hours. No results for input(s): AMMONIA in the last 168 hours.  CBC:  Recent Labs Lab 09/13/14 0357  09/13/14 1310  09/16/14 0400 09/16/14 1500 09/17/14 0353 09/17/14 1400 09/18/14 0342 09/18/14 1525 09/19/14 0013 09/19/14 0500  WBC 10.8*  --  7.4  < > 13.1*  --  12.9* 12.9* 11.8*  --   --  14.4*  NEUTROABS 9.5*  --  7.0  --   --   --   --   --   --   --   --  11.6*  HGB 14.7  < > 8.2*  < > 11.0* 10.1* 9.1* 8.5* 8.2* 9.5* 8.8* 10.8*  HCT 42.0  < > 23.7*  < > 30.3* 27.7* 25.2* 24.1* 23.7* 28.0* 26.0* 30.5*  MCV 101.4*  --  94.4  < > 81.9  --  83.2 84.3 84.9  --   --  85.9  PLT 178  < > 81*  < > 72* 57* 85* 67* 40*  --   --  54*  < > = values in this interval not displayed.  Cardiac Enzymes:  Recent Labs Lab 09/13/14 1400 09/13/14 1859 09/14/14 0020  TROPONINI >80.00* >80.00* >80.00*    BNP: BNP (last 3 results)  Recent Labs  09/13/14 1310  BNP 96.8    ProBNP (last 3 results) No  results for input(s): PROBNP in the last 8760 hours.    Other results:  Imaging: Dg Chest Port 1 View  09/19/2014   CLINICAL DATA:  Endotracheal tube, decreased oxygen saturation.  EXAM: PORTABLE CHEST - 1 VIEW  COMPARISON:  09/19/2014  FINDINGS: Right IJ Swan-Ganz catheter tip projects over the left pulmonary artery. NG tube descends below the level of the image. Endotracheal tube tip projects approximately 7 cm proximal to the carina. Left-sided mediastinal and bilateral pleural drains remain in place. Impella device remains in place, projecting over the level of the T10 vertebral body. Layering bilateral pleural effusions. Associated airspace opacities. Mild interstitial edema not excluded. No pneumothorax. Osteopenia. Status post median sternotomy and CABG. Subcutaneous air along the periphery of the right hemithorax.  IMPRESSION: Similar appearance to support devices as described above.  Layering pleural effusions. Associated airspace opacities ; atelectasis versus pneumonia.   Electronically Signed   By: Jearld LeschAndrew  DelGaizo M.D.   On: 09/19/2014 07:31   Dg Chest Port 1 View  09/19/2014   CLINICAL DATA:  Respiratory failure.  EXAM: PORTABLE CHEST - 1 VIEW  COMPARISON:  09/18/2014  FINDINGS: Endotracheal tube is unchanged. Right jugular approach Swan-Ganz catheter is unchanged with tip projecting over the region of the left main pulmonary artery. Bilateral chest tubes remain in place. An Impella device also remains. Enteric tubes are partially visualized. There is blunting of the right lateral costophrenic angle with hazy right lung base opacity, likely reflecting a combination of right pleural effusion and atelectasis and mildly increased from the prior study. No definite left pleural effusion is identified, although the left costophrenic angle was incompletely imaged. No pneumothorax is identified. Cardiomediastinal silhouette is unchanged. Small amount of subcutaneous emphysema is noted bilaterally in  the chest wall.  IMPRESSION: 1. Support devices as above. 2. Right basilar atelectasis and small right pleural effusion.   Electronically Signed   By: Sebastian AcheAllen  Grady   On: 09/19/2014 03:35   Dg Chest Port 1 View  09/18/2014  CLINICAL DATA:  Status post CABG and closure of patent foramen ovale 5 days ago  EXAM: PORTABLE CHEST - 1 VIEW  COMPARISON:  Portable chest x-ray of September 17, 2014  FINDINGS: The lungs are well-expanded. There is no alveolar infiltrate. There is no pneumothorax or pleural effusion. The bilateral chest tubes are unchanged in position. Minimal bilateral subcutaneous emphysema persists The Swan-Ganz catheter tip projects in the proximal right main pulmonary artery. The Impella device in is in reasonable position radiographically. The feeding tube tip projects below the inferior margin of the image. The endotracheal tube tip lies 5.7 cm above the crotch of the carina.  IMPRESSION: Stable chest x-ray since yesterday's study. There is no pneumonia, pulmonary edema, or pneumothorax. There is underlying COPD. The support tubes and lines are in reasonable position radiographically.   Electronically Signed   By: David  [place]   On: 09/18/2014 07:40   Dg Abd Portable 1v  09/17/2014   CLINICAL DATA:  74 year old male with a history of feeding tube placement.  EXAM: PORTABLE ABDOMEN - 1 VIEW  COMPARISON:  Chest x-ray 09/17/2014  FINDINGS: Single frontal supine view of the abdomen demonstrates metallic tip enteric feeding tube with the tip at the stomach pylorus. Coaxial wire has been withdrawn.  Gas within the stomach.  Transcutaneous pacing leads project over the upper abdomen as well as thoracostomy tubes.  Paucity of small bowel gas.  No acute fracture identified.  IMPRESSION: Metallic tip feeding tube terminates at the pylorus of the stomach.  Support apparatus project over the upper abdomen, in this patient status post CABG.  Signed,  Yvone Neu. Loreta Ave, DO  [facility]  [facility]   Electronically Signed   By: Gilmer Mor D.O.   On: 09/17/2014 11:39     Medications:     Scheduled Medications: . antiseptic oral rinse  7 mL Mouth Rinse QID  . aspirin EC  325 mg Oral Daily   Or  . aspirin  324 mg Per Tube Daily  . atorvastatin  40 mg Oral q1800  . bisacodyl  10 mg Oral Daily   Or  . bisacodyl  10 mg Rectal Daily  . budesonide (PULMICORT) nebulizer solution  0.5 mg Nebulization BID  . chlorhexidine  15 mL Mouth Rinse BID  . digoxin  0.125 mg Intravenous Daily  . docusate sodium  200 mg Oral Daily  . insulin aspart  0-9 Units Subcutaneous 6 times per day  . insulin detemir  15 Units Subcutaneous BID  . ipratropium-albuterol  3 mL Nebulization Q6H  . pantoprazole (PROTONIX) IV  40 mg Intravenous Q24H  . sodium chloride  3 mL Intravenous Q12H    Infusions: . sodium chloride 10 mL (09/18/14 2135)  . sodium chloride 250 mL (09/14/14 0611)  . amiodarone 30 mg/hr (09/19/14 0700)  . dexmedetomidine 1.2 mcg/kg/hr (09/19/14 0740)  . DOPamine 5 mcg/kg/min (09/19/14 0700)  . EPINEPHrine 4 mg in dextrose 5% 250 mL infusion (16 mcg/mL) 16 mcg/min (09/19/14 0700)  . feeding supplement (VITAL 1.5 CAL) 1,000 mL (09/18/14 1900)  . impella catheter heparin 50 unit/mL in dextrose 5%    . heparin 900 Units/hr (09/19/14 0700)  . lactated ringers Stopped (09/18/14 1204)  . lactated ringers 10 mL/hr at 09/18/14 1900  . lidocaine Stopped (09/15/14 0800)  . midazolam (VERSED) infusion 4 mg/hr (09/19/14 0815)  . milrinone 0.375 mcg/kg/min (09/19/14 0700)  . nitroGLYCERIN Stopped (09/13/14 1315)  . norepinephrine (LEVOPHED) Adult infusion 2 mcg/min (09/19/14  0730)    PRN Medications: sodium chloride, albumin human, levalbuterol, metoprolol, midazolam, morphine injection, nitroGLYCERIN, ondansetron (ZOFRAN) IV, oxyCODONE, sodium chloride, traMADol   Assessment:   1. Cardiogenic shock    -Impella CP in place 2. Acute systolic HF EF  25-30% by TEE 1/61 3. Acute anterolateral STEMI    --Emergent CABG 4/13 4. Acute respiratory failure 5. Thrombocytopenia 6. Acute renal failure, stage III 7. Hyponatremia 8. PAF with RVR  Plan/Discussion:    Remains tenuous. Developed pulmonary edema with turning down Impella yesterday. CXR with CHF and effusions. Weight up 30 pounds. Remains on high dose epi.   Would defer Impella extraction today if possible.   Try to switch EPI to levophed. Keep SBP > 90. Begin diuresis.   Start vancomycin for rising WBC.   I would be happy to pull Impella at bedside later in week.   The patient is critically ill with multiple organ systems failure and requires high complexity decision making for assessment and support, frequent evaluation and titration of therapies, application of advanced monitoring technologies and extensive interpretation of multiple databases.   Critical Care Time devoted to patient care services described in this note is 35 Minutes.   Length of Stay: 6 Arvilla Meres MD  09/19/2014, 9:06 AM  Advanced Heart Failure Team Pager [phone] (M-F; 7a - 4p)  Please contact [facility] for night-coverage after hours (4p -7a ) and weekends on [email or web address]

## 2014-09-19 NOTE — Progress Notes (Signed)
Rt note:  Rt called to give a stat breathing tx due to guppy like breaths and desaturations.  Pt was very diminished with what sounded like a forced wheeze (but was not).  Pt was given tx and bs were more audible, but o2 is fluctuating so abg was drawn.  Rt increase fio2 to 70% at this time and will continue to monitor, and titrate as tolerated.

## 2014-09-19 NOTE — Progress Notes (Signed)
NUTRITION FOLLOW UP  INTERVENTION:  Recommend changing formula to Vital AF 1.2 at goal rate of 65 ml/hr to provide 1872 kcals, 117 gm protein, 1259 ml of free water RD to follow for nutrition care plan  NUTRITION DIAGNOSIS: Inadequate oral intake related to inability to eat as evidenced by NPO status, ongoing  Goal: Pt to meet >/= 90% of their estimated nutrition needs, currently unmet  Monitor:  TF regimen & tolerance, respiratory status, weight, labs, I/O's  ASSESSMENT: 74 y/o Male with h/o HTN, HLD and tobacco use presents with epigastric pain that began about 11 pm ad has been continuous. This was associated with SOB and nausea. Symptoms initially were intermittent.He has been getting tired easily and has smoked a lot for a long time. In the ED, ECG revealed SR with ST elevation in aVR, aVL, V1-2 with diffuse deep ST depressions inferior and anterolateral leads.  Patient s/p procedures 4/13: CORONARY ARTERY BYPASS GRAFTING (CABG), ON PUMP, TIMES THREE, USING LEFT INTERNAL MAMMARY ARTERY, LEFT GREATER SAPHENOUS VEIN HARVESTED ENDOSCOPICALLY  PATENT FORAMEN OVALE CLOSURE  Patient is currently intubated on ventilator support MV: 13.4 L/min Temp (24hrs), Avg:98.4 F (36.9 C), Min:97 F (36.1 C), Max:100.4 F (38 C)   Vital 1.5 formula initiated 4/18.  Currently off.  Possibly going to OR for Impella extraction.  Previously infusing at 30 ml/hr via NGT (tip at the pylorus of stomach) providing 1080 kcals, 49 gm protein, 550 ml of free water.  Height: Ht Readings from Last 1 Encounters:  09/16/14 5\' 7"  (1.702 m)    Weight: Wt Readings from Last 1 Encounters:  09/18/14 167 lb 1.7 oz (75.8 kg)    BMI:  Body mass index is 26.17 kg/(m^2).  Estimated Nutritional Needs: Kcal: 1900-2100 Protein: 110-120 gm Fluid: per MD  Skin: surgical chest, leg incisions   Diet Order: Diet NPO time specified Except for: Sips with Meds   Intake/Output Summary (Last 24 hours) at  09/19/14 1229 Last data filed at 09/19/14 1215  Gross per 24 hour  Intake 14904.66 ml  Output   5980 ml  Net 8924.66 ml   Labs:   Recent Labs Lab 09/15/14 0319 09/16/14 0400 09/17/14 0353 09/18/14 0342 09/18/14 1525 09/19/14 0013 09/19/14 0500  NA 129* 128* 130* 129* 130* 129* 132*  K 3.5 4.5 3.7 3.5 3.8 4.6 3.8  CL 100 99 99 100 97 98 103  CO2 21 21 23 23   --   --  23  BUN 17 13 13 12 12 17 11   CREATININE 1.64* 1.43* 1.35 1.18 1.10 1.00 1.03  CALCIUM 7.4* 7.4* 7.3* 7.1*  --   --  7.3*  MG 1.6 1.5  --   --   --   --  1.7  PHOS 2.9 2.7  --   --   --   --  3.1  GLUCOSE 121* 100* 100* 112* 102* 131* 127*    CBG (last 3)   Recent Labs  09/18/14 1959 09/19/14 0012 09/19/14 0731  GLUCAP 118* 138* 111*    Scheduled Meds: . sodium chloride   Intravenous Once  . sodium chloride   Intravenous Once  . antiseptic oral rinse  7 mL Mouth Rinse QID  . aspirin EC  325 mg Oral Daily   Or  . aspirin  324 mg Per Tube Daily  . atorvastatin  40 mg Oral q1800  . bisacodyl  10 mg Oral Daily   Or  . bisacodyl  10 mg Rectal Daily  .  budesonide (PULMICORT) nebulizer solution  0.5 mg Nebulization BID  . chlorhexidine  15 mL Mouth Rinse BID  . digoxin  0.125 mg Intravenous Daily  . digoxin  0.125 mg Intravenous Once  . docusate sodium  200 mg Oral Daily  . insulin aspart  0-9 Units Subcutaneous 6 times per day  . insulin detemir  15 Units Subcutaneous BID  . ipratropium-albuterol  3 mL Nebulization Q6H  . pantoprazole (PROTONIX) IV  40 mg Intravenous Q24H  . sodium chloride  3 mL Intravenous Q12H  . vancomycin  750 mg Intravenous Q12H    Continuous Infusions: . sodium chloride 10 mL (09/18/14 2135)  . sodium chloride 250 mL (09/14/14 0611)  . amiodarone 30 mg/hr (09/19/14 1115)  . dexmedetomidine 1.2 mcg/kg/hr (09/19/14 0740)  . diltiazem (CARDIZEM) infusion    . DOPamine 5 mcg/kg/min (09/19/14 0700)  . EPINEPHrine 4 mg in dextrose 5% 250 mL infusion (16 mcg/mL) 8 mcg/min  (09/19/14 1200)  . feeding supplement (VITAL 1.5 CAL) 1,000 mL (09/18/14 1900)  . furosemide (LASIX) infusion 4 mg/hr (09/19/14 1025)  . impella catheter heparin 50 unit/mL in dextrose 5%    . heparin 900 Units/hr (09/19/14 0700)  . lactated ringers Stopped (09/18/14 1204)  . lactated ringers 10 mL/hr at 09/18/14 1900  . lidocaine Stopped (09/15/14 0800)  . midazolam (VERSED) infusion 4 mg/hr (09/19/14 0930)  . milrinone 0.375 mcg/kg/min (09/19/14 0700)  . nitroGLYCERIN Stopped (09/13/14 1315)  . norepinephrine (LEVOPHED) Adult infusion 40 mcg/min (09/19/14 1030)    Past Medical History  Diagnosis Date  . Dyspnea   . Palpitations   . Anxiety and depression   . GERD (gastroesophageal reflux disease)   . Anxiety   . Depression   . Hypertension   . Hypercholesteremia     Past Surgical History  Procedure Laterality Date  . Appendectomy    . Cataract extraction w/phaco  05/06/2012    Procedure: CATARACT EXTRACTION PHACO AND INTRAOCULAR LENS PLACEMENT (IOC);  Surgeon: Gemma Payor, MD;  Location: [facility];  Service: Ophthalmology;  Laterality: Right;  CDE:22.64  . Cataract extraction w/phaco  05/24/2012    Procedure: CATARACT EXTRACTION PHACO AND INTRAOCULAR LENS PLACEMENT (IOC);  Surgeon: Gemma Payor, MD;  Location: [facility];  Service: Ophthalmology;  Laterality: Left;  CDE: 27.08  . Cardiac catheterization  09/13/2014    Procedure: VENTRICULAR ASSIST DEVICE INSERTION;  Surgeon: Corky Crafts, MD;  Location: [facility] CATH LAB;  Service: Cardiovascular;;  . Cardiac catheterization  09/13/2014    Procedure: CORONARY BALLOON ANGIOPLASTY;  Surgeon: Corky Crafts, MD;  Location: [facility] CATH LAB;  Service: Cardiovascular;;  . Coronary angiogram  09/13/2014    Procedure: CORONARY ANGIOGRAM;  Surgeon: Corky Crafts, MD;  Location: [facility] CATH LAB;  Service: Cardiovascular;;  . Coronary artery bypass graft N/A 09/13/2014    Procedure: CORONARY ARTERY BYPASS GRAFTING (CABG), ON PUMP, TIMES THREE,  USING LEFT INTERNAL MAMMARY ARTERY, LEFT GREATER SAPHENOUS VEIN HARVESTED ENDOSCOPICALLY;  Surgeon: Kerin Perna, MD;  Location: [facility] OR;  Service: Open Heart Surgery;  Laterality: N/A;  . Patent foramen ovale closure  09/13/2014    Procedure: PATENT FORAMEN OVALE CLOSURE;  Surgeon: Kerin Perna, MD;  Location: [facility] OR;  Service: Open Heart Surgery;;    Maureen Chatters, RD, LDN Pager #: [phone] After-Hours Pager #: [phone]

## 2014-09-19 NOTE — Progress Notes (Signed)
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  PCCM consulted post-op for assistance with shock and vent management.  STUDIES:  4/13 cardiac cath 4/13 Echo >> EF 35%  SIGNIFICANT EVENTS: 4/13 CABG, closure of PFO, impella, VDRF, cardiogenic shock  4/14 Lost pacemaker capture >> Cardiac arrest >> 8 minutes before ROSC >> wide complex tachycardia >> cardioversion; Hb 5 >> transfused PRBC 4/18 A fib with RVR >> amiodarone added 4/19 Oxygen desaturation >> suctioned mucus plug  SUBJECTIVE: Remains on full vent support, pressors.  VITAL SIGNS: Temp:  [97 F (36.1 C)-100.4 F (38 C)] 100 F (37.8 C) (04/19 0715) Pulse Rate:  [25-105] 102 (04/19 0715) Resp:  [16-31] 31 (04/19 0715) BP: (87-88)/(57-58) 87/58 mmHg (04/19 0700) SpO2:  [79 %-100 %] 100 % (04/19 0745) Arterial Line BP: (83-116)/(58-72) 92/62 mmHg (04/18 1600) FiO2 (%):  [40 %-100 %] 60 % (04/19 0800) HEMODYNAMICS: PAP: (26-41)/(10-29) 32/11 mmHg CVP:  [10 mmHg-16 mmHg] 13 mmHg PCWP:  [13 mmHg-14 mmHg] 13 mmHg CO:  [2.9 L/min-4.7 L/min] 4.7 L/min CI:  [1.7 L/min/m2-2.8 L/min/m2] 2.8 L/min/m2 VENTILATOR SETTINGS: Vent Mode:  [-] PRVC FiO2 (%):  [40 %-100 %] 60 % Set Rate:  [14 bmp-16 bmp] 16 bmp Vt Set:  [600 mL] 600 mL PEEP:  [5 cmH20] 5 cmH20 Plateau Pressure:  [17 cmH20-21 cmH20] 21 cmH20 INTAKE / OUTPUT: Intake/Output      04/18 0701 - 04/19 0700 04/19 0701 - 04/20 0700   I.V. (mL/kg) 3510.8 (46.3)    Blood 855    Other 373.5    NG/GT 562.5 30   IV Piggyback 550 50   Total Intake(mL/kg) 5851.8 (77.2) 80 (1.1)   Urine (mL/kg/hr) 3970  (2.2)    Emesis/NG output     Chest Tube 360 (0.2)    Total Output 4330     Net +1521.8 +80         PHYSICAL EXAMINATION: General: no distress Neuro: RASS -2 HEENT: ETT in place Cardiovascular: irregular Lungs: scattered faint crackles Abdomen: soft, non tender  Musculoskeletal: No edema Skin: no rashes  LABS:  CBC  Recent Labs Lab 09/17/14 1400 09/18/14 0342 09/18/14 1525 09/19/14 0013 09/19/14 0500  WBC 12.9* 11.8*  --   --  14.4*  HGB 8.5* 8.2* 9.5* 8.8* 10.8*  HCT 24.1* 23.7* 28.0* 26.0* 30.5*  PLT 67* 40*  --   --  54*   Coag's  Recent Labs Lab 09/13/14 0357 09/13/14 1400 09/13/14 1859 09/14/14 0323  APTT 34 72* 83*  --   INR 1.05  --  2.48* 1.60*   BMET  Recent Labs Lab 09/17/14 0353 09/18/14 0342 09/18/14 1525 09/19/14 0013 09/19/14 0500  NA 130* 129* 130* 129* 132*  K 3.7 3.5 3.8 4.6 3.8  CL 99 100 97 98 103  CO2 23 23  --   --  23  BUN 13 12 12 17 11   CREATININE 1.35 1.18 1.10 1.00 1.03  GLUCOSE 100* 112* 102* 131* 127*   Electrolytes  Recent Labs Lab 09/15/14 0319 09/16/14 0400 09/17/14 0353 09/18/14 0342 09/19/14 0500  CALCIUM 7.4* 7.4* 7.3* 7.1* 7.3*  MG 1.6 1.5  --   --  1.7  PHOS 2.9 2.7  --   --  3.1   Sepsis Markers  Recent Labs Lab 09/14/14 1314 09/14/14 1600 09/14/14 2359  LATICACIDVEN 3.1* 3.1* 3.5*   ABG  Recent Labs Lab 09/19/14 0404 09/19/14 0439 09/19/14 0509  PHART 7.288* 7.236* 7.321*  PCO2ART 48.9* 59.1* 46.9*  PO2ART 91.0 149.0* 189.0*   Liver Enzymes  Recent Labs Lab 09/17/14 0353 09/18/14 0342 09/19/14 0500  AST 132* 81* 96*  ALT ALKPHOS 93 66 80  BILITOT 4.6* 7.4* 12.1*  ALBUMIN 1.6* 1.5* 1.9*   Cardiac Enzymes  Recent Labs Lab 09/13/14 1400 09/13/14 1859 09/14/14 0020  TROPONINI >80.00* >80.00* >80.00*   Glucose  Recent Labs Lab 09/17/14 2349 09/18/14 0352 09/18/14 0810 09/18/14 1223 09/18/14 1959 09/19/14 0012  GLUCAP 115* 97 110* 101* 118* 138*     Imaging Dg Chest Port 1 View  09/18/2014   CLINICAL DATA:  Status post CABG and closure of patent foramen ovale 5 days ago  EXAM: PORTABLE CHEST - 1 VIEW  COMPARISON:  Portable chest x-ray of September 17, 2014  FINDINGS: The lungs are well-expanded. There is no alveolar infiltrate. There is no pneumothorax or pleural effusion. The bilateral chest tubes are unchanged in position. Minimal bilateral subcutaneous emphysema persists The Swan-Ganz catheter tip projects in the proximal right main pulmonary artery. The Impella device in is in reasonable position radiographically. The feeding tube tip projects below the inferior margin of the image. The endotracheal tube tip lies 5.7 cm above the crotch of the carina.  IMPRESSION: Stable chest x-ray since yesterday's study. There is no pneumonia, pulmonary edema, or pneumothorax. There is underlying COPD. The support tubes and lines are in reasonable position radiographically.   Electronically Signed   By: David  [place]   On: 09/18/2014 07:40   ASSESSMENT / PLAN:  CARDIOVASCULAR Rt IJ introducer 4/13 >> Lt radial aline 4/13 >> A:  STEMI s/p CABG and PFO closure 4/13. Acute systolic CHF. Cardiogenic shock. A fib with RVR. Hx of HTN, HLD. P:  Continue ASA, lipitor Digoxin, amiodarone per cardiology, TCTS Wean pressors for MAP goal > 65 Plan for impella removal 4/19 Hold outpt inderal  PULMONARY ETT 4/13 >> A: Acute hypoxic respiratory failure following CABG x 3. Hx COPD with continued tobacco abuse. P:   Full vent support Continue scheduled pulmicort, duoneb with prn xopenex F/u CXR, ABG  RENAL A:   Hyponatremia in setting of acute systolic CHF. P:   Optimize hemodynamics Monitor renal fx, urine outpt  GASTROINTESTINAL A:   Nutrition. Hx of GERD. P:   Post-pyloric tube feeds Protonix for SUP  HEMATOLOGIC A:   Anemia of critical illness. Thrombocytopenia. P:  F/u CBC Transfuse for Hb < 8 in setting of STEMI Heparin  gtt  INFECTIOUS A:   No evidence for infection. P:   Monitor clinically  ENDOCRINE A:   Hyperglycemia. P:   SSI  NEUROLOGIC A:   Acute metabolic encephalopathy 2nd to cardiogenic shock, respiratory failure. Hx of Anxiety / Depression. P:   RASS goal -1 Continue precedex  CC time 40 minutes.  Coralyn Helling, MD [facility] Pulmonary/Critical Care 09/19/2014, 8:53 AM Pager:  [phone] After 3pm call: [phone]

## 2014-09-19 NOTE — Progress Notes (Signed)
Rt increased Fio2 per ABG Po2 of 49.  Rt will titrate as able to tolerate.

## 2014-09-19 NOTE — Progress Notes (Signed)
Patient ID: [name], male   DOB: 01/26/1941, 74 y.o.   MRN: [id number] EVENING ROUNDS NOTE :     [address]       [place] [id number]             [phone]                 6 Days Post-Op Procedure(s) (LRB): CORONARY ARTERY BYPASS GRAFTING (CABG), ON PUMP, TIMES THREE, USING LEFT INTERNAL MAMMARY ARTERY, LEFT GREATER SAPHENOUS VEIN HARVESTED ENDOSCOPICALLY (N/A) PATENT FORAMEN OVALE CLOSURE  Total Length of Stay:  LOS: 6 days  BP 100/59 mmHg  Pulse 102  Temp(Src) 98.1 F (36.7 C) (Core (Comment))  Resp 17  Ht 5\' 7"  (1.702 m)  Wt 167 lb 1.7 oz (75.8 kg)  BMI 26.17 kg/m2  SpO2 100%  .Intake/Output      04/19 0701 - 04/20 0700   I.V. (mL/kg) 1980.7 (26.1)   Blood 231   Other 130.2   NG/GT 310   IV Piggyback 250   Total Intake(mL/kg) 2901.9 (38.3)   Urine (mL/kg/hr) 4935 (5.4)   Emesis/NG output 250 (0.3)   Chest Tube 60 (0.1)   Total Output 5245   Net -2343.1         . sodium chloride 10 mL (09/18/14 2135)  . sodium chloride 250 mL (09/14/14 0611)  . amiodarone 60 mg/hr (09/19/14 1335)  . dexmedetomidine 1.2 mcg/kg/hr (09/19/14 1715)  . DOPamine 5 mcg/kg/min (09/19/14 0700)  . EPINEPHrine 4 mg in dextrose 5% 250 mL infusion (16 mcg/mL) Stopped (09/19/14 1345)  . feeding supplement (VITAL 1.5 CAL) Stopped (09/19/14 1200)  . furosemide (LASIX) infusion 4 mg/hr (09/19/14 1025)  . impella catheter heparin 50 unit/mL in dextrose 5%    . heparin 900 Units/hr (09/19/14 0700)  . lactated ringers Stopped (09/18/14 1204)  . lactated ringers 10 mL/hr at 09/18/14 1900  . lidocaine Stopped (09/15/14 0800)  . midazolam (VERSED) infusion 3 mg/hr (09/19/14 1400)  . milrinone 0.375 mcg/kg/min (09/19/14 1725)  . nitroGLYCERIN Stopped (09/13/14 1315)  . norepinephrine (LEVOPHED) Adult infusion 35 mcg/min (09/19/14 1725)     Lab Results  Component Value Date   WBC 14.4* 09/19/2014   HGB 10.2* 09/19/2014   HCT 30.0* 09/19/2014   PLT 54* 09/19/2014   GLUCOSE 85 09/19/2014   ALT 24 09/19/2014   AST 96* 09/19/2014   NA 134* 09/19/2014   K 3.7 09/19/2014   CL 96 09/19/2014   CREATININE 1.20 09/19/2014   BUN 13 09/19/2014   CO2 23 09/19/2014   TSH 0.704 09/13/2014   INR 1.60* 09/14/2014   HGBA1C 5.3 09/13/2014   impella not removed today Holding sinus now after cardioversion from afib this am Full vent support   Delight OvensEdward B Jasiel Belisle MD  Beeper [phone] Office [phone] 09/19/2014 7:06 PM

## 2014-09-19 NOTE — Progress Notes (Signed)
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 and bilirubin 12 Will attempt wean with TEE in OR today Not ready for vent wean due to severe COPD  Objective: Vital signs in last 24 hours: Temp:  [97 F (36.1 C)-100.4 F (38 C)] 98.4 F (36.9 C) (04/19 1400) Pulse Rate:  [25-139] 103 (04/19 1400) Cardiac Rhythm:  [-] Sinus tachycardia (04/19 1400) Resp:  [16-31] 19 (04/19 1400) BP: (87-100)/(57-59) 100/59 mmHg (04/19 1400) SpO2:  [79 %-100 %] 100 % (04/19 1400) Arterial Line BP: (92-99)/(62-68) 92/62 mmHg (04/18 1600) FiO2 (%):  [40 %-100 %] 60 % (04/19 1400)  Hemodynamic parameters for last 24 hours: PAP: (23-41)/(10-29) 26/17 mmHg CVP:  [10 mmHg-16 mmHg] 11 mmHg CO:  [3.5 L/min-5 L/min] 5 L/min CI:  [2.1 L/min/m2-2.9 L/min/m2] 2.9 L/min/m2  Intake/Output from previous day: 04/18 0701 - 04/19 0700 In: 5851.8 [I.V.:3510.8; Blood:855; NG/GT:562.5; IV Piggyback:550] Out: 4330 [Urine:3970; Chest Tube:360] Intake/Output this shift: Total I/O In: 16669.7 [I.V.:16180.2; Blood:30; Other:79.5; NG/GT:180; IV Piggyback:200] Out: 2815 [Urine:2785; Chest Tube:30]  Scattered rhonchi  Lab Results:  Recent Labs  09/18/14 0342  09/19/14 0500 09/19/14 1212  WBC 11.8*  --  14.4*  --   HGB 8.2*  < > 10.8* 11.6*  HCT 23.7*  < > 30.5* 34.0*  PLT 40*  --  54*  --   < > = values in this interval not displayed. BMET:  Recent Labs  09/18/14 0342  09/19/14 0500 09/19/14 1212  NA 129*  < > 132* 130*  K 3.5  < > 3.8 4.1  CL 100  < > 103 99  CO2 23  --  23  --   GLUCOSE 112*  < > 127* 195*  BUN 12  < > 11 14  CREATININE 1.18  < > 1.03 1.10  CALCIUM 7.1*  --  7.3*  --   < > = values in this interval not displayed.  PT/INR:  No results for input(s): LABPROT, INR in the last 72 hours. ABG    Component Value Date/Time   PHART 7.387 09/19/2014 1208   HCO3 22.1 09/19/2014 1208   TCO2 20 09/19/2014 1212   ACIDBASEDEF 2.0 09/19/2014 1208   O2SAT 96.0 09/19/2014 1208   CBG (last 3)   Recent Labs  09/18/14 1959 09/19/14 0012 09/19/14 0731  GLUCAP 118* 138* 111*    Assessment/Plan: S/P Procedure(s) (LRB): CORONARY ARTERY BYPASS GRAFTING (CABG), ON PUMP, TIMES THREE, USING LEFT INTERNAL MAMMARY ARTERY, LEFT GREATER SAPHENOUS VEIN HARVESTED ENDOSCOPICALLY (N/A) PATENT FORAMEN OVALE CLOSURE Wean LVAD as tolerated Not ready to extubate   LOS: 6 days    [name] 09/19/2014

## 2014-09-20 ENCOUNTER — Inpatient Hospital Stay (HOSPITAL_COMMUNITY): Payer: Medicare Other

## 2014-09-20 DIAGNOSIS — I361 Nonrheumatic tricuspid (valve) insufficiency: Secondary | ICD-10-CM

## 2014-09-20 LAB — POCT I-STAT, CHEM 8
BUN: 16 mg/dL (ref 6–23)
Calcium, Ion: 1.04 mmol/L — ABNORMAL LOW (ref 1.13–1.30)
Chloride: 91 mmol/L — ABNORMAL LOW (ref 96–112)
Creatinine, Ser: 1.4 mg/dL — ABNORMAL HIGH (ref 0.50–1.35)
Glucose, Bld: 153 mg/dL — ABNORMAL HIGH (ref 70–99)
HEMATOCRIT: 30 % — AB (ref 39.0–52.0)
HEMOGLOBIN: 10.2 g/dL — AB (ref 13.0–17.0)
POTASSIUM: 3.7 mmol/L (ref 3.5–5.1)
SODIUM: 130 mmol/L — AB (ref 135–145)
TCO2: 21 mmol/L (ref 0–100)

## 2014-09-20 LAB — CBC WITH DIFFERENTIAL/PLATELET
Basophils Absolute: 0 10*3/uL (ref 0.0–0.1)
Basophils Absolute: 0 10*3/uL (ref 0.0–0.1)
Basophils Absolute: 0 10*3/uL (ref 0.0–0.1)
Basophils Relative: 0 % (ref 0–1)
Basophils Relative: 0 % (ref 0–1)
Basophils Relative: 0 % (ref 0–1)
EOS PCT: 1 % (ref 0–5)
Eosinophils Absolute: 0.1 10*3/uL (ref 0.0–0.7)
Eosinophils Absolute: 0.2 10*3/uL (ref 0.0–0.7)
Eosinophils Absolute: 0.1 10*3/uL (ref 0.0–0.7)
Eosinophils Relative: 1 % (ref 0–5)
Eosinophils Relative: 2 % (ref 0–5)
HCT: 30.5 % — ABNORMAL LOW (ref 39.0–52.0)
HCT: 25.9 % — ABNORMAL LOW (ref 39.0–52.0)
HEMATOCRIT: 28.1 % — AB (ref 39.0–52.0)
Hemoglobin: 10.8 g/dL — ABNORMAL LOW (ref 13.0–17.0)
Hemoglobin: 9.1 g/dL — ABNORMAL LOW (ref 13.0–17.0)
Hemoglobin: 10.1 g/dL — ABNORMAL LOW (ref 13.0–17.0)
LYMPHS ABS: 0.7 10*3/uL (ref 0.7–4.0)
LYMPHS PCT: 7 % — AB (ref 12–46)
Lymphocytes Relative: 7 % — ABNORMAL LOW (ref 12–46)
Lymphocytes Relative: 4 % — ABNORMAL LOW (ref 12–46)
Lymphs Abs: 1 10*3/uL (ref 0.7–4.0)
Lymphs Abs: 0.4 10*3/uL — ABNORMAL LOW (ref 0.7–4.0)
MCH: 30.4 pg (ref 26.0–34.0)
MCH: 29.7 pg (ref 26.0–34.0)
MCH: 30.1 pg (ref 26.0–34.0)
MCHC: 35.4 g/dL (ref 30.0–36.0)
MCHC: 35.1 g/dL (ref 30.0–36.0)
MCHC: 35.9 g/dL (ref 30.0–36.0)
MCV: 85.9 fL (ref 78.0–100.0)
MCV: 84.6 fL (ref 78.0–100.0)
MCV: 83.9 fL (ref 78.0–100.0)
MONOS PCT: 8 % (ref 3–12)
Monocytes Absolute: 1.7 10*3/uL — ABNORMAL HIGH (ref 0.1–1.0)
Monocytes Absolute: 1.4 10*3/uL — ABNORMAL HIGH (ref 0.1–1.0)
Monocytes Absolute: 0.8 10*3/uL (ref 0.1–1.0)
Monocytes Relative: 12 % (ref 3–12)
Monocytes Relative: 14 % — ABNORMAL HIGH (ref 3–12)
Neutro Abs: 11.6 10*3/uL — ABNORMAL HIGH (ref 1.7–7.7)
Neutro Abs: 7.9 10*3/uL — ABNORMAL HIGH (ref 1.7–7.7)
Neutro Abs: 8.4 10*3/uL — ABNORMAL HIGH (ref 1.7–7.7)
Neutrophils Relative %: 80 % — ABNORMAL HIGH (ref 43–77)
Neutrophils Relative %: 80 % — ABNORMAL HIGH (ref 43–77)
Neutrophils Relative %: 84 % — ABNORMAL HIGH (ref 43–77)
PLATELETS: 89 10*3/uL — AB (ref 150–400)
Platelets: 54 10*3/uL — ABNORMAL LOW (ref 150–400)
Platelets: 63 10*3/uL — ABNORMAL LOW (ref 150–400)
RBC: 3.55 MIL/uL — ABNORMAL LOW (ref 4.22–5.81)
RBC: 3.06 MIL/uL — ABNORMAL LOW (ref 4.22–5.81)
RBC: 3.35 MIL/uL — AB (ref 4.22–5.81)
RDW: 15.9 % — ABNORMAL HIGH (ref 11.5–15.5)
RDW: 16 % — ABNORMAL HIGH (ref 11.5–15.5)
RDW: 15.9 % — ABNORMAL HIGH (ref 11.5–15.5)
WBC: 14.4 10*3/uL — ABNORMAL HIGH (ref 4.0–10.5)
WBC: 9.9 10*3/uL (ref 4.0–10.5)
WBC: 10 10*3/uL (ref 4.0–10.5)

## 2014-09-20 LAB — URINALYSIS, ROUTINE W REFLEX MICROSCOPIC
Glucose, UA: NEGATIVE mg/dL
KETONES UR: NEGATIVE mg/dL
LEUKOCYTES UA: NEGATIVE
Nitrite: NEGATIVE
PH: 5 (ref 5.0–8.0)
Protein, ur: NEGATIVE mg/dL
SPECIFIC GRAVITY, URINE: 1.009 (ref 1.005–1.030)
Urobilinogen, UA: 0.2 mg/dL (ref 0.0–1.0)

## 2014-09-20 LAB — GLUCOSE, CAPILLARY
GLUCOSE-CAPILLARY: 100 mg/dL — AB (ref 70–99)
GLUCOSE-CAPILLARY: 122 mg/dL — AB (ref 70–99)
Glucose-Capillary: 115 mg/dL — ABNORMAL HIGH (ref 70–99)
Glucose-Capillary: 129 mg/dL — ABNORMAL HIGH (ref 70–99)
Glucose-Capillary: 136 mg/dL — ABNORMAL HIGH (ref 70–99)
Glucose-Capillary: 145 mg/dL — ABNORMAL HIGH (ref 70–99)
Glucose-Capillary: 152 mg/dL — ABNORMAL HIGH (ref 70–99)

## 2014-09-20 LAB — URINE MICROSCOPIC-ADD ON

## 2014-09-20 LAB — POCT I-STAT 3, ART BLOOD GAS (G3+)
BICARBONATE: 24.8 meq/L — AB (ref 20.0–24.0)
O2 Saturation: 97 %
PO2 ART: 93 mmHg (ref 80.0–100.0)
Patient temperature: 36.8
TCO2: 26 mmol/L (ref 0–100)
pCO2 arterial: 40.1 mmHg (ref 35.0–45.0)
pH, Arterial: 7.399 (ref 7.350–7.450)

## 2014-09-20 LAB — BILIRUBIN, FRACTIONATED(TOT/DIR/INDIR)
BILIRUBIN DIRECT: 8.3 mg/dL — AB (ref 0.0–0.5)
BILIRUBIN INDIRECT: 4.7 mg/dL — AB (ref 0.3–0.9)
Total Bilirubin: 13 mg/dL — ABNORMAL HIGH (ref 0.3–1.2)

## 2014-09-20 LAB — COMPREHENSIVE METABOLIC PANEL
ALT: 24 U/L (ref 0–53)
AST: 63 U/L — ABNORMAL HIGH (ref 0–37)
Albumin: 2.1 g/dL — ABNORMAL LOW (ref 3.5–5.2)
Alkaline Phosphatase: 83 U/L (ref 39–117)
Anion gap: 7 (ref 5–15)
BUN: 12 mg/dL (ref 6–23)
CO2: 26 mmol/L (ref 19–32)
Calcium: 7 mg/dL — ABNORMAL LOW (ref 8.4–10.5)
Chloride: 98 mmol/L (ref 96–112)
Creatinine, Ser: 1.19 mg/dL (ref 0.50–1.35)
GFR calc Af Amer: 69 mL/min — ABNORMAL LOW (ref >90)
GFR calc non Af Amer: 60 mL/min — ABNORMAL LOW (ref >90)
Glucose, Bld: 134 mg/dL — ABNORMAL HIGH (ref 70–99)
Potassium: 3.4 mmol/L — ABNORMAL LOW (ref 3.5–5.1)
Sodium: 131 mmol/L — ABNORMAL LOW (ref 135–145)
Total Bilirubin: 13.3 mg/dL — ABNORMAL HIGH (ref 0.3–1.2)
Total Protein: 4.2 g/dL — ABNORMAL LOW (ref 6.0–8.3)

## 2014-09-20 LAB — PREPARE RBC (CROSSMATCH)

## 2014-09-20 LAB — PREPARE PLATELET PHERESIS: Unit division: 0

## 2014-09-20 LAB — POCT ACTIVATED CLOTTING TIME
ACTIVATED CLOTTING TIME: 147 s
Activated Clotting Time: 159 s
Activated Clotting Time: 159 s
Activated Clotting Time: 159 seconds
Activated Clotting Time: 153 seconds

## 2014-09-20 LAB — LACTATE DEHYDROGENASE: LDH: 686 U/L — ABNORMAL HIGH (ref 94–250)

## 2014-09-20 LAB — CARBOXYHEMOGLOBIN
Carboxyhemoglobin: 1.2 % (ref 0.5–1.5)
Methemoglobin: 1.1 % (ref 0.0–1.5)
O2 Saturation: 68.2 %
Total hemoglobin: 9.1 g/dL — ABNORMAL LOW (ref 13.5–18.0)

## 2014-09-20 MED ORDER — DEXTROSE 5 % IV SOLN
1.0000 g | Freq: Three times a day (TID) | INTRAVENOUS | Status: DC
  Administered 2014-09-20 – 2014-09-27 (×21): 1 g via INTRAVENOUS
  Filled 2014-09-20 (×23): qty 1

## 2014-09-20 MED ORDER — SODIUM CHLORIDE 0.9 % IV SOLN
Freq: Once | INTRAVENOUS | Status: AC
  Administered 2014-09-20: 10:00:00 via INTRAVENOUS

## 2014-09-20 MED ORDER — ALBUMIN HUMAN 5 % IV SOLN
12.5000 g | Freq: Once | INTRAVENOUS | Status: AC
  Administered 2014-09-20: 12.5 g via INTRAVENOUS

## 2014-09-20 MED ORDER — AMIODARONE IV BOLUS ONLY 150 MG/100ML
150.0000 mg | Freq: Once | INTRAVENOUS | Status: AC
  Administered 2014-09-20: 150 mg via INTRAVENOUS

## 2014-09-20 MED ORDER — FUROSEMIDE 10 MG/ML IJ SOLN
40.0000 mg | Freq: Once | INTRAMUSCULAR | Status: AC
  Administered 2014-09-20: 40 mg via INTRAVENOUS

## 2014-09-20 MED ORDER — MORPHINE SULFATE 4 MG/ML IJ SOLN: 4.0000 mg | Freq: Once | INTRAMUSCULAR | Status: AC

## 2014-09-20 MED ORDER — POTASSIUM CHLORIDE 10 MEQ/50ML IV SOLN
10.0000 meq | INTRAVENOUS | Status: AC
  Administered 2014-09-20 (×3): 10 meq via INTRAVENOUS
  Filled 2014-09-20 (×3): qty 50

## 2014-09-20 MED ORDER — AMIODARONE IV BOLUS ONLY 150 MG/100ML
150.0000 mg | Freq: Once | INTRAVENOUS | Status: DC
  Filled 2014-09-20: qty 100

## 2014-09-20 MED ORDER — VITAL 1.5 CAL PO LIQD
1000.0000 mL | ORAL | Status: DC
  Administered 2014-09-21: 1000 mL
  Filled 2014-09-20 (×3): qty 1000

## 2014-09-20 MED ORDER — POTASSIUM CHLORIDE 10 MEQ/50ML IV SOLN
10.0000 meq | INTRAVENOUS | Status: AC
  Administered 2014-09-20 (×3): 10 meq via INTRAVENOUS

## 2014-09-20 NOTE — Progress Notes (Addendum)
Spoke with Dr. Cornelius Moraswen regarding rapid a-fib and hypotension. Order received to stop Lasix drip, give 1 albumin, Amiodarone 150mg   bolus (informed of previous Amiodarone bolus attempts,) and to call cardiology for cardioversion if hypotension persists. Once off the phone, initiated albumin, stopped Lasix drip, and paged cardiology (SBP in low 70s, upper 60s) to update on patient status. Dr. Tresa EndoKelly came to bedside. Decision made to attempt cardioversion. Called patient's daughter Burna MortimerWanda to obtain consent for cardioversion. Myself and Dr. Tresa EndoKelly spoke with her to obtain consent. Patient given a total of 4mg  Morphine and 8mg  Versed to achieve adequate sedation prior to cardioversion. We attempted cardoiversion x2; at 120 joules and 200 joules respectively.  After first shock, he was still in atrial fib with a rate in the 120s-130s. After second shock he was momentarily sinus rhythm, but then went back into atrial fib with a rate in the 120s. Amiodarone bolus administered. Second albumin administered. At this time, will continue to closely monitor. See flow-sheet for vital sign documentation. Thresa RossA. Aerabella Galasso RN

## 2014-09-20 NOTE — Progress Notes (Signed)
Echocardiogram Echocardiogram Transesophageal has been performed.  [name], [name] 09/20/2014, 11:54 AM

## 2014-09-20 NOTE — Progress Notes (Signed)
Unable to obtain ample amt for sputum specimen.  Respiratory therapy will continue to suction pt for specimen. 

## 2014-09-20 NOTE — Progress Notes (Signed)
7 Days Post-Op Procedure(s) (LRB): CORONARY ARTERY BYPASS GRAFTING (CABG), ON PUMP, TIMES THREE, USING LEFT INTERNAL MAMMARY ARTERY, LEFT GREATER SAPHENOUS VEIN HARVESTED ENDOSCOPICALLY (N/A) PATENT FORAMEN OVALE CLOSURE Subjective: impella weaned to P2 Low plts- will transfuse prior to LVAD removal CXR with RLL infiltrate- check sputum culture and cover with Elita QuickFortaz  Objective: Vital signs in last 24 hours: Temp:  [97 F (36.1 C)-99.7 F (37.6 C)] 98.2 F (36.8 C) (04/20 0800) Pulse Rate:  [56-142] 111 (04/20 0800) Cardiac Rhythm:  [-] Sinus tachycardia (04/20 0800) Resp:  [12-28] 25 (04/20 0800) BP: (95-100)/(52-59) 100/52 mmHg (04/20 0800) SpO2:  [95 %-100 %] 95 % (04/20 0800) FiO2 (%):  [40 %-60 %] 40 % (04/20 0800) Weight:  [163 lb 12.8 oz (74.3 kg)] 163 lb 12.8 oz (74.3 kg) (04/20 0500)  Hemodynamic parameters for last 24 hours: PAP: (19-34)/(13-28) 22/19 mmHg CVP:  [6 mmHg-11 mmHg] 7 mmHg CO:  [3.9 L/min-5 L/min] 3.9 L/min CI:  [2.3 L/min/m2-3 L/min/m2] 2.3 L/min/m2  Intake/Output from previous day: 04/19 0701 - 04/20 0700 In: 5826.5 [I.V.:3700.8; Blood:231; NG/GT:730; IV Piggyback:900] Out: 1914 [[id number] [Urine:8635; Emesis/NG output:250; Chest Tube:210] Intake/Output this shift: Total I/O In: 182.9 [I.V.:140; Other:12.9; NG/GT:30] Out: 100 [Urine:100]  extrem warm scattered rhonchi  Lab Results:  Recent Labs  09/19/14 0500  09/19/14 1708 09/20/14 0400  WBC 14.4*  --   --  9.9  HGB 10.8*  < > 10.2* 9.1*  HCT 30.5*  < > 30.0* 25.9*  PLT 54*  --   --  PENDING  < > = values in this interval not displayed. BMET:  Recent Labs  09/19/14 0500  09/19/14 1708 09/20/14 0400  NA 132*  < > 134* 131*  K 3.8  < > 3.7 3.4*  CL 103  < > 96 98  CO2 23  --   --  26  GLUCOSE 127*  < > 85 134*  BUN 11  < > 13 12  CREATININE 1.03  < > 1.20 1.19  CALCIUM 7.3*  --   --  7.0*  < > = values in this interval not displayed.  PT/INR: No results for input(s): LABPROT, INR in the  last 72 hours. ABG    Component Value Date/Time   PHART 7.387 09/19/2014 1208   HCO3 22.1 09/19/2014 1208   TCO2 23 09/19/2014 1708   ACIDBASEDEF 2.0 09/19/2014 1208   O2SAT 68.2 09/20/2014 0500   CBG (last 3)   Recent Labs  09/20/14 0012 09/20/14 0354 09/20/14 0750  GLUCAP 152* 122* 115*    Assessment/Plan: S/P Procedure(s) (LRB): CORONARY ARTERY BYPASS GRAFTING (CABG), ON PUMP, TIMES THREE, USING LEFT INTERNAL MAMMARY ARTERY, LEFT GREATER SAPHENOUS VEIN HARVESTED ENDOSCOPICALLY (N/A) PATENT FORAMEN OVALE CLOSURE prob remove Impella, stop heparin   LOS: 7 days    Kathlee Nationseter Van Trigt III 09/20/2014

## 2014-09-20 NOTE — Progress Notes (Signed)
TCTS BRIEF SICU PROGRESS NOTE  7 Days Post-Op  S/P Procedure(s) (LRB): CORONARY ARTERY BYPASS GRAFTING (CABG), ON PUMP, TIMES THREE, USING LEFT INTERNAL MAMMARY ARTERY, LEFT GREATER SAPHENOUS VEIN HARVESTED ENDOSCOPICALLY (N/A) PATENT FORAMEN OVALE CLOSURE   Reasonably stable day Impella removed by Dr Gala RomneyBensimhon Some A-fib earlier, converted back to sinus Hemodynamics stable UOP 175-200 mL/hr  Plan: Continue current plan  Justin NailsClarence H Knight 09/20/2014 7:13 PM

## 2014-09-20 NOTE — Progress Notes (Signed)
Brief X-Cover Note  Notified by primary RN that [name] is in rapid atrial fibrillation with RVR with profound hypotension as low as 50 currently 70/30s. He has not tolerated atrial fibrillation previously. He is on heparin gtt. Family updated/notified/verbally consented while preparing [name] for emergent cardioversion. 4 mg morphine and 8 mg versed given as he is on versed gtt and intubated. 120 J and 200 J cardioversion attempted. 120 J not successful. 200 J cardioversion temporarily converted to NSR with PACs but within 30 seconds returned to atrial fibrillation. Blood pressure improved during sedation so 150 mg IV bolus amiodarone given. Continue to watch closely. He remains on IV amiodarone gtt at 1 mg/min. He tolerated the procedure well.   Leeann MustJacob Isabel Ardila, MD

## 2014-09-20 NOTE — Progress Notes (Signed)
PULMONARY / CRITICAL CARE MEDICINE   Name: [name] MRN: [id number] DOB: December 19, 1940    ADMISSION DATE:  09/13/2014 CONSULTATION DATE:  09/20/2014  REFERRING MD :  Donata Clay  CHIEF COMPLAINT:  Epigastric pain  INITIAL PRESENTATION:   74 yo male smoker presented with epigastric pain from anterolateral MI STEMI >> found to have multi-vessel CAD >> To OR for emergent CABG, closure of PFO, and Impella placement.  PCCM consulted post-op for assistance with shock and vent management.  STUDIES:  4/13 cardiac cath 4/13 Echo >> EF 35%  SIGNIFICANT EVENTS: 4/13 CABG, closure of PFO, impella, VDRF, cardiogenic shock  4/14 Lost pacemaker capture >> Cardiac arrest >> 8 minutes before ROSC >> wide complex tachycardia >> cardioversion; Hb 5 >> transfused PRBC 4/18 A fib with RVR >> amiodarone added 4/19 Oxygen desaturation >> suctioned mucus plug; a fib with RVR >> cardioversion.  SUBJECTIVE: Remains on full vent support, pressors.  VITAL SIGNS: Temp:  [97 F (36.1 C)-100 F (37.8 C)] 98.2 F (36.8 C) (04/20 0700) Pulse Rate:  [56-142] 108 (04/20 0700) Resp:  [12-31] 22 (04/20 0700) BP: (95-100)/(59) 100/59 mmHg (04/19 1400) SpO2:  [99 %-100 %] 100 % (04/20 0700) FiO2 (%):  [60 %] 60 % (04/20 0700) Weight:  [163 lb 12.8 oz (74.3 kg)] 163 lb 12.8 oz (74.3 kg) (04/20 0500) HEMODYNAMICS: PAP: (19-35)/(11-28) 22/19 mmHg CVP:  [6 mmHg-13 mmHg] 10 mmHg CO:  [3.9 L/min-5 L/min] 4.4 L/min CI:  [2.3 L/min/m2-3 L/min/m2] 2.6 L/min/m2 VENTILATOR SETTINGS: Vent Mode:  [-] PRVC FiO2 (%):  [60 %] 60 % Set Rate:  [16 bmp] 16 bmp Vt Set:  [600 mL] 600 mL PEEP:  [5 cmH20] 5 cmH20 Plateau Pressure:  [16 cmH20-19 cmH20] 16 cmH20 INTAKE / OUTPUT: Intake/Output      04/19 0701 - 04/20 0700 04/20 0701 - 04/21 0700   I.V. (mL/kg) 3560.8 (47.9)    Blood 231    Other 264.7    NG/GT 700    IV Piggyback 850    Total Intake(mL/kg) 5606.5 (75.5)    Urine (mL/kg/hr) 8535 (4.8)    Emesis/NG output  250 (0.1)    Chest Tube 210 (0.1)    Total Output 8995     Net -3388.5           PHYSICAL EXAMINATION: General: no distress Neuro: RASS -3 HEENT: ETT in place Cardiovascular: regular, tachycardic Lungs: scattered faint crackles Abdomen: soft, non tender  Musculoskeletal: No edema Skin: no rashes  LABS:  CBC  Recent Labs Lab 09/18/14 0342  09/19/14 0500 09/19/14 1212 09/19/14 1708 09/20/14 0400  WBC 11.8*  --  14.4*  --   --  9.9  HGB 8.2*  < > 10.8* 11.6* 10.2* 9.1*  HCT 23.7*  < > 30.5* 34.0* 30.0* 25.9*  PLT 40*  --  54*  --   --  PENDING  < > = values in this interval not displayed.   Coag's  Recent Labs Lab 09/13/14 1400 09/13/14 1859 09/14/14 0323  APTT 72* 83*  --   INR  --  2.48* 1.60*   BMET  Recent Labs Lab 09/18/14 0342  09/19/14 0500 09/19/14 1212 09/19/14 1708 09/20/14 0400  NA 129*  < > 132* 130* 134* 131*  K 3.5  < > 3.8 4.1 3.7 3.4*  CL 100  < > 103 99 96 98  CO2 23  --  23  --   --  26  BUN 12  < > 11  CREATININE 1.18  < > 1.03 1.10 1.20 1.19  GLUCOSE 112*  < > 127* 195* 85 134*  < > = values in this interval not displayed.   Electrolytes  Recent Labs Lab 09/15/14 0319 09/16/14 0400  09/18/14 0342 09/19/14 0500 09/20/14 0400  CALCIUM 7.4* 7.4*  < > 7.1* 7.3* 7.0*  MG 1.6 1.5  --   --  1.7  --   PHOS 2.9 2.7  --   --  3.1  --   < > = values in this interval not displayed.   Sepsis Markers  Recent Labs Lab 09/14/14 1314 09/14/14 1600 09/14/14 2359  LATICACIDVEN 3.1* 3.1* 3.5*   ABG  Recent Labs Lab 09/19/14 0439 09/19/14 0509 09/19/14 1208  PHART 7.236* 7.321* 7.387  PCO2ART 59.1* 46.9* 37.0  PO2ART 149.0* 189.0* 85.0   Liver Enzymes  Recent Labs Lab 09/18/14 0342 09/19/14 0500 09/20/14 0400  AST 81* 96* 63*  ALT ALKPHOS 66 80 83  BILITOT 7.4* 12.1* 13.3*  ALBUMIN 1.5* 1.9* 2.1*   Cardiac Enzymes  Recent Labs Lab 09/13/14 1400 09/13/14 1859 09/14/14 0020  TROPONINI  >80.00* >80.00* >80.00*   Glucose  Recent Labs Lab 09/19/14 0401 09/19/14 0731 09/19/14 1148 09/19/14 1957 09/20/14 0012 09/20/14 0354  GLUCAP 126* 111* 189* 100* 152* 122*    Imaging Dg Chest Port 1 View  09/19/2014   CLINICAL DATA:  Endotracheal tube, decreased oxygen saturation.  EXAM: PORTABLE CHEST - 1 VIEW  COMPARISON:  09/19/2014  FINDINGS: Right IJ Swan-Ganz catheter tip projects over the left pulmonary artery. NG tube descends below the level of the image. Endotracheal tube tip projects approximately 7 cm proximal to the carina. Left-sided mediastinal and bilateral pleural drains remain in place. Impella device remains in place, projecting over the level of the T10 vertebral body. Layering bilateral pleural effusions. Associated airspace opacities. Mild interstitial edema not excluded. No pneumothorax. Osteopenia. Status post median sternotomy and CABG. Subcutaneous air along the periphery of the right hemithorax.  IMPRESSION: Similar appearance to support devices as described above.  Layering pleural effusions. Associated airspace opacities ; atelectasis versus pneumonia.   Electronically Signed   By: Jearld Lesch M.D.   On: 09/19/2014 07:31   Dg Chest Port 1 View  09/19/2014   CLINICAL DATA:  Respiratory failure.  EXAM: PORTABLE CHEST - 1 VIEW  COMPARISON:  09/18/2014  FINDINGS: Endotracheal tube is unchanged. Right jugular approach Swan-Ganz catheter is unchanged with tip projecting over the region of the left main pulmonary artery. Bilateral chest tubes remain in place. An Impella device also remains. Enteric tubes are partially visualized. There is blunting of the right lateral costophrenic angle with hazy right lung base opacity, likely reflecting a combination of right pleural effusion and atelectasis and mildly increased from the prior study. No definite left pleural effusion is identified, although the left costophrenic angle was incompletely imaged. No pneumothorax is  identified. Cardiomediastinal silhouette is unchanged. Small amount of subcutaneous emphysema is noted bilaterally in the chest wall.  IMPRESSION: 1. Support devices as above. 2. Right basilar atelectasis and small right pleural effusion.   Electronically Signed   By: Sebastian Ache   On: 09/19/2014 03:35   ASSESSMENT / PLAN:  CARDIOVASCULAR Rt IJ introducer 4/13 >> Lt radial aline 4/13 >> A:  STEMI s/p CABG and PFO closure 4/13. Acute systolic CHF. Cardiogenic shock. A fib with RVR. Hx of HTN, HLD. P:  Continue ASA, lipitor Digoxin, amiodarone,  lasix gtt per cardiology, TCTS Wean pressors for MAP goal > 60 Plan for impella removal 4/20 Hold outpt inderal  PULMONARY ETT 4/13 >> A: Acute hypoxic respiratory failure following CABG x 3. Hx COPD with continued tobacco abuse. P:   Full vent support Continue scheduled pulmicort, duoneb with prn xopenex F/u CXR, ABG  RENAL A:   Hyponatremia in setting of acute systolic CHF. Hypokalemia. P:   Optimize hemodynamics Monitor renal fx, urine outpt  GASTROINTESTINAL A:   Nutrition. Hx of GERD. P:   Post-pyloric tube feeds Protonix for SUP  HEMATOLOGIC A:   Anemia of critical illness. Thrombocytopenia. P:  F/u CBC Transfuse for Hb < 8 in setting of STEMI Heparin gtt  INFECTIOUS A:   No evidence for infection. P:   Monitor clinically  ENDOCRINE A:   Hyperglycemia. P:   SSI  NEUROLOGIC A:   Acute metabolic encephalopathy 2nd to cardiogenic shock, respiratory failure. Hx of Anxiety / Depression. P:   RASS goal -3 Continue precedex  CC time 40 minutes.  Coralyn HellingVineet Olia Hinderliter, MD [facility] 09/20/2014, 7:37 AM Pager:  [phone] After 3pm call: [phone]

## 2014-09-20 NOTE — Progress Notes (Addendum)
   Impella pulled personally at the bedside. I held manual pressure for over 50 minutes and hemostasis obtained. Fem stopped then applied.  During procedure patient was in AF with RVR and dropped his SBP into 60-70s.  Given IV amiodarone and epinephrine restarted and titrated as needed   Will hold heparin for 8 hours after sheath pull and then restart with no bolus.   Total CCT 70 minutes in addition to time already spent this am.  Truman Haywardaniel Vergia Chea,MD 4:02 PM

## 2014-09-20 NOTE — Progress Notes (Signed)
Advanced Heart Failure Rounding Note   Subjective:    Currently sedated but responds to commands when awake.   Underwent DC-CV for rapid AF yesterday. In/out of AF on amio.  Impella has been on P-2 since 11p. Remains on levophed 40. SBP 90 when in NSR. 70s in AF. Getting platelets now. Diuresing well but lasix turned down due to low BP.   and went into pulmonary edema. Weight up 30 pounds. Now on Epi 16. Milrinone 0.375  Impella waveform looks good. Flow 1.6. Co-ox 68%. Bilirubin 13 today   CVP 6  PA 24/19 (20)  PCWP 13 Thermo 4.5 /2.6     Objective:   Weight Range:  Vital Signs:   Temp:  [97 F (36.1 C)-99.3 F (37.4 C)] 98.2 F (36.8 C) (04/20 0800) Pulse Rate:  [56-142] 111 (04/20 0800) Resp:  [12-28] 25 (04/20 0800) BP: (100)/(52-59) 100/52 mmHg (04/20 0800) SpO2:  [95 %-100 %] 95 % (04/20 0800) FiO2 (%):  [40 %-60 %] 40 % (04/20 0915) Weight:  [74.3 kg (163 lb 12.8 oz)] 74.3 kg (163 lb 12.8 oz) (04/20 0500)    Weight change: Filed Weights   09/18/14 0600 09/19/14 0445 09/20/14 0500  Weight: 75.8 kg (167 lb 1.7 oz) 75.8 kg (167 lb 1.7 oz) 74.3 kg (163 lb 12.8 oz)    Intake/Output:   Intake/Output Summary (Last 24 hours) at 09/20/14 0939 Last data filed at 09/20/14 0900  Gross per 24 hour  Intake 5803.53 ml  Output   8515 ml  Net -2711.47 ml     Physical Exam: General:  Intubated sedated. Jaundiced HEENT: ETT. Scleral icterus.  Neck: supple.  RIJ swan Carotids 2+ bilat; no bruits. No lymphadenopathy or thryomegaly appreciated. Cor: Sternal dressing. +CTs PMI nondisplaced. Irregular rate & rhythm. Impella hum Lungs: clear Abdomen: soft, nontender, nondistended. No hepatosplenomegaly. No bruits or masses. Hypoactive bowel sounds. Extremities: no cyanosis, clubbing, rash, 2+ edema Impella site no longer oozing Neuro: alert & orientedx3, cranial nerves grossly intact. moves all 4 extremities w/o difficulty. Affect pleasant  Telemetry: AF  115  Labs: Basic Metabolic Panel:  Recent Labs Lab 09/14/14 0322 09/14/14 1510  09/15/14 0319 09/16/14 0400 09/17/14 0353 09/18/14 0342  09/19/14 0013 09/19/14 0500 09/19/14 1212 09/19/14 1708 09/20/14 0400  NA 137  --   < > 129* 128* 130* 129*  < > 129* 132* 130* 134* 131*  K 3.2*  --   < > 3.5 4.5 3.7 3.5  < > 4.6 3.8 4.1 3.7 3.4*  CL 103  --   < > 100 99 99 100  < > 98 103 99 96 98  CO2 20  --   --  --   --  23  --   --  26  GLUCOSE 188*  --   < > 121* 100* 100* 112*  < > 131* 127* 195* 85 134*  BUN 11  --   < > < > CREATININE 1.24 1.76*  < > 1.64* 1.43* 1.35 1.18  < > 1.00 1.03 1.10 1.20 1.19  CALCIUM 8.8  --   --  7.4* 7.4* 7.3* 7.1*  --   --  7.3*  --   --  7.0*  MG 1.9 1.7  --  1.6 1.5  --   --   --   --  1.7  --   --   --   PHOS  --   --   --  2.9 2.7  --   --   --   --  3.1  --   --   --   < > = values in this interval not displayed.  Liver Function Tests:  Recent Labs Lab 09/16/14 0400 09/17/14 0353 09/18/14 0342 09/19/14 0500 09/20/14 0400  AST 148* 132* 81* 96* 63*  ALT 23 19 20 24 24   ALKPHOS 52 93 66 80 83  BILITOT 3.6* 4.6* 7.4* 12.1* 13.3*  PROT 3.9* 3.5* 3.5* 4.1* 4.2*  ALBUMIN 1.9* 1.6* 1.5* 1.9* 2.1*   No results for input(s): LIPASE, AMYLASE in the last 168 hours. No results for input(s): AMMONIA in the last 168 hours.  CBC:  Recent Labs Lab 09/13/14 1310  09/17/14 0353 09/17/14 1400 09/18/14 0342  09/19/14 0013 09/19/14 0500 09/19/14 1212 09/19/14 1708 09/20/14 0400  WBC 7.4  < > 12.9* 12.9* 11.8*  --   --  14.4*  --   --  9.9  NEUTROABS 7.0  --   --   --   --   --   --  11.6*  --   --  7.9*  HGB 8.2*  < > 9.1* 8.5* 8.2*  < > 8.8* 10.8* 11.6* 10.2* 9.1*  HCT 23.7*  < > 25.2* 24.1* 23.7*  < > 26.0* 30.5* 34.0* 30.0* 25.9*  MCV 94.4  < > 83.2 84.3 84.9  --   --  85.9  --   --  84.6  PLT 81*  < > 85* 67* 40*  --   --  54*  --   --  PENDING  < > = values in this interval not  displayed.  Cardiac Enzymes:  Recent Labs Lab 09/13/14 1400 09/13/14 1859 09/14/14 0020  TROPONINI >80.00* >80.00* >80.00*    BNP: BNP (last 3 results)  Recent Labs  09/13/14 1310  BNP 96.8    ProBNP (last 3 results) No results for input(s): PROBNP in the last 8760 hours.    Other results:  Imaging: Dg Chest Port 1 View  09/20/2014   CLINICAL DATA:  Coronary artery disease.  Status post CABG.  EXAM: PORTABLE CHEST - 1 VIEW  COMPARISON:  09/19/2014 and 09/18/2014  FINDINGS: Endotracheal tube, NG tube, Swan-Ganz catheter, aortic balloon catheter, and chest tubes remain in place, unchanged.  No pneumothorax. Resolution of slight interstitial edema. Heart size and vascularity are normal. Small bilateral effusions with minimal atelectasis at the left lung base.  IMPRESSION: Resolution of mild interstitial edema. Small residual effusions and minimal left base atelectasis.   Electronically Signed   By: Francene Boyers M.D.   On: 09/20/2014 07:50   Dg Chest Port 1 View  09/19/2014   CLINICAL DATA:  Endotracheal tube, decreased oxygen saturation.  EXAM: PORTABLE CHEST - 1 VIEW  COMPARISON:  09/19/2014  FINDINGS: Right IJ Swan-Ganz catheter tip projects over the left pulmonary artery. NG tube descends below the level of the image. Endotracheal tube tip projects approximately 7 cm proximal to the carina. Left-sided mediastinal and bilateral pleural drains remain in place. Impella device remains in place, projecting over the level of the T10 vertebral body. Layering bilateral pleural effusions. Associated airspace opacities. Mild interstitial edema not excluded. No pneumothorax. Osteopenia. Status post median sternotomy and CABG. Subcutaneous air along the periphery of the right hemithorax.  IMPRESSION: Similar appearance to support devices as described above.  Layering pleural effusions. Associated airspace opacities ; atelectasis versus pneumonia.   Electronically Signed   By: Lerry Liner.D.  On: 09/19/2014 07:31   Dg Chest Port 1 View  09/19/2014   CLINICAL DATA:  Respiratory failure.  EXAM: PORTABLE CHEST - 1 VIEW  COMPARISON:  09/18/2014  FINDINGS: Endotracheal tube is unchanged. Right jugular approach Swan-Ganz catheter is unchanged with tip projecting over the region of the left main pulmonary artery. Bilateral chest tubes remain in place. An Impella device also remains. Enteric tubes are partially visualized. There is blunting of the right lateral costophrenic angle with hazy right lung base opacity, likely reflecting a combination of right pleural effusion and atelectasis and mildly increased from the prior study. No definite left pleural effusion is identified, although the left costophrenic angle was incompletely imaged. No pneumothorax is identified. Cardiomediastinal silhouette is unchanged. Small amount of subcutaneous emphysema is noted bilaterally in the chest wall.  IMPRESSION: 1. Support devices as above. 2. Right basilar atelectasis and small right pleural effusion.   Electronically Signed   By: Sebastian AcheAllen  Grady   On: 09/19/2014 03:35     Medications:     Scheduled Medications: . sodium chloride   Intravenous Once  . sodium chloride   Intravenous Once  . antiseptic oral rinse  7 mL Mouth Rinse QID  . aspirin EC  325 mg Oral Daily   Or  . aspirin  324 mg Per Tube Daily  . atorvastatin  40 mg Oral q1800  . bisacodyl  10 mg Oral Daily   Or  . bisacodyl  10 mg Rectal Daily  . budesonide (PULMICORT) nebulizer solution  0.5 mg Nebulization BID  . cefTAZidime (FORTAZ)  IV  1 g Intravenous Q8H  . chlorhexidine  15 mL Mouth Rinse BID  . digoxin  0.125 mg Intravenous Daily  . digoxin  0.125 mg Intravenous Once  . docusate sodium  200 mg Oral Daily  . feeding supplement (VITAL 1.5 CAL)  1,000 mL Per Tube Q24H  . insulin aspart  0-9 Units Subcutaneous 6 times per day  . insulin detemir  15 Units Subcutaneous BID  . ipratropium-albuterol  3 mL Nebulization Q6H  .  pantoprazole (PROTONIX) IV  40 mg Intravenous Q24H  . sodium chloride  3 mL Intravenous Q12H  . vancomycin  750 mg Intravenous Q12H    Infusions: . sodium chloride 10 mL (09/18/14 2135)  . sodium chloride 250 mL (09/14/14 0611)  . amiodarone 60 mg/hr (09/20/14 0703)  . dexmedetomidine 1.2 mcg/kg/hr (09/20/14 [id number])  . DOPamine 5 mcg/kg/min (09/20/14 0600)  . EPINEPHrine 4 mg in dextrose 5% 250 mL infusion (16 mcg/mL) Stopped (09/19/14 1345)  . furosemide (LASIX) infusion 2 mg/hr (09/20/14 0600)  . impella catheter heparin 50 unit/mL in dextrose 5%    . heparin 900 Units/hr (09/20/14 0600)  . lactated ringers Stopped (09/18/14 1204)  . lactated ringers 10 mL/hr at 09/18/14 1900  . lidocaine Stopped (09/15/14 0800)  . midazolam (VERSED) infusion 2 mg/hr (09/20/14 0600)  . milrinone 0.375 mcg/kg/min (09/20/14 0600)  . nitroGLYCERIN Stopped (09/13/14 1315)  . norepinephrine (LEVOPHED) Adult infusion 40 mcg/min (09/20/14 0845)    PRN Medications: sodium chloride, albumin human, levalbuterol, metoprolol, midazolam, morphine injection, nitroGLYCERIN, ondansetron (ZOFRAN) IV, oxyCODONE, sodium chloride, traMADol   Assessment:   1. Cardiogenic shock    -Impella CP in place 2. Acute systolic HF EF 25-30% by TEE 4/15 3. Acute anterolateral STEMI    --Emergent CABG 4/13 4. Acute respiratory failure 5. Thrombocytopenia 6. Acute renal failure, stage III 7. Hyponatremia 8. PAF with RVR 9. Hyperbilirubinemia  Plan/Discussion:    Remains  tenuous. Tolerating wean of Impella but still requiring high-dose levophed. Now with evidence of significant hemolysis with bilrirubin 13. Will plan to pull Impella today. Will get TEE. Will likely need epi for support once Impella out.   Continue amio for AF.   WBC falling on vancomycin.   Remains markedly volume overloaded. Diurese as tolerated.   The patient is critically ill with multiple organ systems failure and requires high complexity  decision making for assessment and support, frequent evaluation and titration of therapies, application of advanced monitoring technologies and extensive interpretation of multiple databases.   Critical Care Time devoted to patient care services described in this note is 35 Minutes.   Length of Stay: 7 Arvilla Meres MD  09/20/2014, 9:39 AM  Advanced Heart Failure Team Pager [phone] (M-F; 7a - 4p)  Please contact [facility] for night-coverage after hours (4p -7a ) and weekends on [email or web address]

## 2014-09-20 NOTE — Progress Notes (Signed)
Spoke with Dr. Onalee HuaAlvarez (cardiology) at bedside regarding conversion to a-fib with a rate 110s-130s. BP 102/59 currently. Will continue to closely monitor at this time. If hypotension occurs will consider cardioversion. Thresa RossA. Rydan Gulyas RN

## 2014-09-20 NOTE — CV Procedure (Signed)
    TRANSESOPHAGEAL ECHOCARDIOGRAM   NAME:  Shelby MattocksDennis W Steuart   MRN: [id number] DOB:  May 13, 1941   ADMIT DATE: 09/13/2014  INDICATIONS:   PROCEDURE:   Informed consent was obtained fromprior to the procedure.  Risks include, but are not limited to, cough, sore throat, vomiting, nausea, somnolence, esophageal and stomach trauma or perforation, bleeding, low blood pressure, aspiration, pneumonia, infection, trauma.   COMPLICATIONS:    There were no immediate complications.  FINDINGS:  LEFT VENTRICLE: EF = 25-30%. Anterior AK  RIGHT VENTRICLE: Mild HK  LEFT ATRIUM: Dilated  LEFT ATRIAL APPENDAGE: No thrombus.   RIGHT ATRIUM: Normal  AORTIC VALVE:  Trileaflet. Mild AI. Impella well positioned  MITRAL VALVE:   Trivial MR  TRICUSPID VALVE: Mild MR  PULMONIC VALVE: Grossly normal.  INTERATRIAL SEPTUM: No PFO or ASD.  PERICARDIUM: No effusion  DESCENDING AORTA: Moderate plaque  Truman Haywardaniel Bensimhon,MD 3:53 PM

## 2014-09-20 NOTE — Progress Notes (Signed)
Made Dr. Onalee HuaAlvarez aware the BP dropping into 70s at times, and rebounding into mid 80s.  (On Levophed 40mcg.) MD to come to bedside to assess. Thresa RossA. Ruperto Kiernan RN

## 2014-09-20 NOTE — Progress Notes (Signed)
Fem-stop pressure released hourly. Bensimhon at bedside and removed final pressure, fem-stop to stay in place. Franki CabotBrianna Dandrea Widdowson, RN

## 2014-09-20 NOTE — Progress Notes (Signed)
UR Completed.  [phone]  

## 2014-09-20 NOTE — Progress Notes (Signed)
Fem stop removed. Site is clean, dry, intact. Small amount of bruising around site noted. No hematoma palpated. Placed pressure dressing over site. Will continue to closely monitor for any signs of bleeding or hematoma. Thresa RossA. Adelyna Brockman RN

## 2014-09-21 ENCOUNTER — Inpatient Hospital Stay (HOSPITAL_COMMUNITY): Payer: Medicare Other

## 2014-09-21 LAB — POCT I-STAT 4, (NA,K, GLUC, HGB,HCT)
Glucose, Bld: 158 mg/dL — ABNORMAL HIGH (ref 70–99)
HEMATOCRIT: 31 % — AB (ref 39.0–52.0)
HEMOGLOBIN: 10.5 g/dL — AB (ref 13.0–17.0)
POTASSIUM: 3.8 mmol/L (ref 3.5–5.1)
SODIUM: 131 mmol/L — AB (ref 135–145)

## 2014-09-21 LAB — PREALBUMIN: Prealbumin: 7 mg/dL — ABNORMAL LOW (ref 21–43)

## 2014-09-21 LAB — COMPREHENSIVE METABOLIC PANEL
ALT: 24 U/L (ref 0–53)
AST: 60 U/L — ABNORMAL HIGH (ref 0–37)
Albumin: 2.4 g/dL — ABNORMAL LOW (ref 3.5–5.2)
Alkaline Phosphatase: 99 U/L (ref 39–117)
Anion gap: 10 (ref 5–15)
BUN: 16 mg/dL (ref 6–23)
CO2: 26 mmol/L (ref 19–32)
Calcium: 7 mg/dL — ABNORMAL LOW (ref 8.4–10.5)
Chloride: 92 mmol/L — ABNORMAL LOW (ref 96–112)
Creatinine, Ser: 1.52 mg/dL — ABNORMAL HIGH (ref 0.50–1.35)
GFR calc Af Amer: 51 mL/min — ABNORMAL LOW (ref >90)
GFR calc non Af Amer: 44 mL/min — ABNORMAL LOW (ref >90)
Glucose, Bld: 176 mg/dL — ABNORMAL HIGH (ref 70–99)
Potassium: 3.5 mmol/L (ref 3.5–5.1)
Sodium: 128 mmol/L — ABNORMAL LOW (ref 135–145)
Total Bilirubin: 13.9 mg/dL — ABNORMAL HIGH (ref 0.3–1.2)
Total Protein: 4.9 g/dL — ABNORMAL LOW (ref 6.0–8.3)

## 2014-09-21 LAB — GLUCOSE, CAPILLARY
Glucose-Capillary: 108 mg/dL — ABNORMAL HIGH (ref 70–99)
Glucose-Capillary: 151 mg/dL — ABNORMAL HIGH (ref 70–99)
Glucose-Capillary: 152 mg/dL — ABNORMAL HIGH (ref 70–99)
Glucose-Capillary: 171 mg/dL — ABNORMAL HIGH (ref 70–99)
Glucose-Capillary: 181 mg/dL — ABNORMAL HIGH (ref 70–99)

## 2014-09-21 LAB — CARBOXYHEMOGLOBIN
Carboxyhemoglobin: 1.5 % (ref 0.5–1.5)
Methemoglobin: 1 % (ref 0.0–1.5)
O2 Saturation: 65.9 %
Total hemoglobin: 11.5 g/dL — ABNORMAL LOW (ref 13.5–18.0)

## 2014-09-21 LAB — TYPE AND SCREEN
ABO/RH(D): O POS
ANTIBODY SCREEN: NEGATIVE
UNIT DIVISION: 0
UNIT DIVISION: 0
Unit division: 0
Unit division: 0
Unit division: 0
Unit division: 0

## 2014-09-21 LAB — CBC WITH DIFFERENTIAL/PLATELET
Basophils Absolute: 0 10*3/uL (ref 0.0–0.1)
Basophils Relative: 0 % (ref 0–1)
Eosinophils Absolute: 0.1 10*3/uL (ref 0.0–0.7)
Eosinophils Relative: 1 % (ref 0–5)
HCT: 26.5 % — ABNORMAL LOW (ref 39.0–52.0)
Hemoglobin: 9.4 g/dL — ABNORMAL LOW (ref 13.0–17.0)
Lymphocytes Relative: 5 % — ABNORMAL LOW (ref 12–46)
Lymphs Abs: 0.5 10*3/uL — ABNORMAL LOW (ref 0.7–4.0)
MCH: 30 pg (ref 26.0–34.0)
MCHC: 35.5 g/dL (ref 30.0–36.0)
MCV: 84.7 fL (ref 78.0–100.0)
Monocytes Absolute: 0.9 10*3/uL (ref 0.1–1.0)
Monocytes Relative: 9 % (ref 3–12)
Neutro Abs: 8 10*3/uL — ABNORMAL HIGH (ref 1.7–7.7)
Neutrophils Relative %: 85 % — ABNORMAL HIGH (ref 43–77)
Platelets: 90 10*3/uL — ABNORMAL LOW (ref 150–400)
RBC: 3.13 MIL/uL — ABNORMAL LOW (ref 4.22–5.81)
RDW: 16.7 % — ABNORMAL HIGH (ref 11.5–15.5)
WBC: 9.5 10*3/uL (ref 4.0–10.5)

## 2014-09-21 LAB — POCT I-STAT 3, ART BLOOD GAS (G3+)
Acid-Base Excess: 1 mmol/L (ref 0.0–2.0)
BICARBONATE: 25.5 meq/L — AB (ref 20.0–24.0)
Bicarbonate: 26.4 mEq/L — ABNORMAL HIGH (ref 20.0–24.0)
O2 SAT: 92 %
O2 SAT: 93 %
PH ART: 7.407 (ref 7.350–7.450)
PO2 ART: 60 mmHg — AB (ref 80.0–100.0)
PO2 ART: 65 mmHg — AB (ref 80.0–100.0)
Patient temperature: 36.3
TCO2: 27 mmol/L (ref 0–100)
TCO2: 28 mmol/L (ref 0–100)
pCO2 arterial: 41.6 mmHg (ref 35.0–45.0)
pCO2 arterial: 41.7 mmHg (ref 35.0–45.0)
pH, Arterial: 7.391 (ref 7.350–7.450)

## 2014-09-21 LAB — PREPARE PLATELET PHERESIS: Unit division: 0

## 2014-09-21 LAB — VANCOMYCIN, TROUGH: Vancomycin Tr: 25.8 ug/mL (ref 10.0–20.0)

## 2014-09-21 LAB — LACTATE DEHYDROGENASE: LDH: 524 U/L — ABNORMAL HIGH (ref 94–250)

## 2014-09-21 LAB — PREPARE RBC (CROSSMATCH)

## 2014-09-21 LAB — PROTIME-INR
INR: 1.01 (ref 0.00–1.49)
Prothrombin Time: 13.4 seconds (ref 11.6–15.2)

## 2014-09-21 LAB — HEPARIN LEVEL (UNFRACTIONATED): Heparin Unfractionated: <0.1 IU/mL — ABNORMAL LOW (ref 0.30–0.70)

## 2014-09-21 MED ORDER — IPRATROPIUM-ALBUTEROL 0.5-2.5 (3) MG/3ML IN SOLN
3.0000 mL | RESPIRATORY_TRACT | Status: DC | PRN
  Administered 2014-10-04: 3 mL via RESPIRATORY_TRACT
  Filled 2014-09-21: qty 3

## 2014-09-21 MED ORDER — SODIUM CHLORIDE 0.9 % IV SOLN
Freq: Once | INTRAVENOUS | Status: AC
  Administered 2014-09-21: 23:00:00 via INTRAVENOUS

## 2014-09-21 MED ORDER — VANCOMYCIN HCL 10 G IV SOLR
1250.0000 mg | INTRAVENOUS | Status: DC
  Administered 2014-09-21: 1250 mg via INTRAVENOUS
  Filled 2014-09-21 (×2): qty 1250

## 2014-09-21 MED ORDER — SODIUM CHLORIDE 0.9 % IJ SOLN: 10.0000 mL | INTRAMUSCULAR | Status: DC | PRN

## 2014-09-21 MED ORDER — HEPARIN (PORCINE) IN NACL 100-0.45 UNIT/ML-% IJ SOLN
900.0000 [IU]/h | INTRAMUSCULAR | Status: DC
  Filled 2014-09-21: qty 250

## 2014-09-21 MED ORDER — RANOLAZINE ER 500 MG PO TB12
500.0000 mg | ORAL_TABLET | Freq: Two times a day (BID) | ORAL | Status: DC
  Filled 2014-09-21 (×2): qty 1

## 2014-09-21 MED ORDER — SODIUM CHLORIDE 0.9 % IJ SOLN
10.0000 mL | Freq: Two times a day (BID) | INTRAMUSCULAR | Status: DC
  Administered 2014-09-21: 10 mL
  Administered 2014-09-22: 30 mL
  Administered 2014-09-22 – 2014-09-23 (×3): 20 mL
  Administered 2014-09-24 – 2014-09-26 (×5): 10 mL
  Administered 2014-09-27: 30 mL
  Administered 2014-09-28 – 2014-10-04 (×11): 10 mL

## 2014-09-21 MED ORDER — POTASSIUM CHLORIDE 10 MEQ/50ML IV SOLN
10.0000 meq | INTRAVENOUS | Status: AC
  Administered 2014-09-21 (×3): 10 meq via INTRAVENOUS
  Filled 2014-09-21: qty 50

## 2014-09-21 MED ORDER — ENOXAPARIN SODIUM 40 MG/0.4ML ~~LOC~~ SOLN
40.0000 mg | Freq: Every day | SUBCUTANEOUS | Status: DC
  Administered 2014-09-21 – 2014-09-25 (×5): 40 mg via SUBCUTANEOUS
  Filled 2014-09-21 (×6): qty 0.4

## 2014-09-21 NOTE — Progress Notes (Signed)
8 Days Post-Op Procedure(s) (LRB): CORONARY ARTERY BYPASS GRAFTING (CABG), ON PUMP, TIMES THREE, USING LEFT INTERNAL MAMMARY ARTERY, LEFT GREATER SAPHENOUS VEIN HARVESTED ENDOSCOPICALLY (N/A) PATENT FORAMEN OVALE CLOSURE Subjective:  Status post emergency CABG with temporary LVAD-impella CP LVAD removed after 7 days of support. Hemodynamics are except for what the patient has a high inotropic requirement Elevated bilirubin from hemolysis from LVAD Baseline severe COPD with ventilator dependence Objective: Vital signs in last 24 hours: Temp:  [97.2 F (36.2 C)-98.2 F (36.8 C)] 98.2 F (36.8 C) (04/21 1515) Pulse Rate:  [53-140] 104 (04/21 1515) Cardiac Rhythm:  [-] Sinus tachycardia (04/21 1315) Resp:  [0-30] 20 (04/21 1515) BP: (99-104)/(49-53) 99/49 mmHg (04/21 0316) SpO2:  [90 %-100 %] 100 % (04/21 1515) FiO2 (%):  [40 %-50 %] 40 % (04/21 0843) Weight:  [166 lb 0.1 oz (75.3 kg)] 166 lb 0.1 oz (75.3 kg) (04/21 0500)  Hemodynamic parameters for last 24 hours: PAP: (20-38)/(12-31) 24/16 mmHg CVP:  [8 mmHg-12 mmHg] 10 mmHg PCWP:  [17 mmHg] 17 mmHg CO:  [4 L/min-5.6 L/min] 4.1 L/min CI:  [2.3 L/min/m2-3.3 L/min/m2] 2.4 L/min/m2  Intake/Output from previous day: 04/20 0701 - 04/21 0700 In: 6591 [I.V.:4445; Blood:568; NG/GT:627.5; IV Piggyback:900] Out: 4625 [id number][Urine:4565; Chest Tube:60] Intake/Output this shift: Total I/O In: 2368.9 [I.V.:1678.9; NG/GT:540; IV Piggyback:150] Out: 3205 [Urine:2935; Emesis/NG output:250; Chest Tube:20]  Sedated on ventilator but moves extremities Sternal incision healing Minimal peripheral edema with good peripheral pulses  Lab Results:  Recent Labs  09/20/14 1530 09/20/14 1600 09/21/14 0420  WBC 10.0  --  9.5  HGB 10.1* 10.2* 9.4*  HCT 28.1* 30.0* 26.5*  PLT 89*  --  90*   BMET:  Recent Labs  09/20/14 0400 09/20/14 1600 09/21/14 0420  NA 131* 130* 128*  K 3.4* 3.7 3.5  CL 98 91* 92*  CO2 26  --  26  GLUCOSE 134* 153* 176*   BUN 12 16 16   CREATININE 1.19 1.40* 1.52*  CALCIUM 7.0*  --  7.0*    PT/INR:  Recent Labs  09/21/14 0900  LABPROT 13.4  INR 1.01   ABG    Component Value Date/Time   PHART 7.407 09/21/2014 0417   HCO3 26.4* 09/21/2014 0417   TCO2 28 09/21/2014 0417   ACIDBASEDEF 2.0 09/19/2014 1208   O2SAT 65.9 09/21/2014 0500   CBG (last 3)   Recent Labs  09/21/14 0750 09/21/14 1202 09/21/14 1607  GLUCAP 151* 152* 108*    Assessment/Plan: S/P Procedure(s) (LRB): CORONARY ARTERY BYPASS GRAFTING (CABG), ON PUMP, TIMES THREE, USING LEFT INTERNAL MAMMARY ARTERY, LEFT GREATER SAPHENOUS VEIN HARVESTED ENDOSCOPICALLY (N/A) PATENT FORAMEN OVALE CLOSURE Since LVAD is out. Systemic heparin Start Lovenox prophylaxis for DVT Weaning inotropes as tolerated per cardiology-appreciate Dr. Prescott GumBensimhon's input Wean ventilator per CCM   LOS: 8 days    [name] 09/21/2014

## 2014-09-21 NOTE — Progress Notes (Signed)
PULMONARY / CRITICAL CARE MEDICINE   Name: [name] MRN: [id number] DOB: 1940/08/21    ADMISSION DATE:  09/13/2014 CONSULTATION DATE:  09/21/2014  REFERRING MD :  Donata Clay  CHIEF COMPLAINT:  Epigastric pain  INITIAL PRESENTATION:   74 yo male smoker presented with epigastric pain from anterolateral MI STEMI >> found to have multi-vessel CAD >> To OR for emergent CABG, closure of PFO, and Impella placement.  PCCM consulted post-op for assistance with shock and vent management.  STUDIES:  4/13 cardiac cath 4/13 Echo >> EF 35%  SIGNIFICANT EVENTS: 4/13 CABG, closure of PFO, impella, VDRF, cardiogenic shock  4/14 Lost pacemaker capture >> Cardiac arrest >> 8 minutes before ROSC >> wide complex tachycardia >> cardioversion; Hb 5 >> transfused PRBC 4/18 A fib with RVR >> amiodarone added 4/19 Oxygen desaturation >> suctioned mucus plug; a fib with RVR >> cardioversion 4/20 Impella out; A fib with RVR >> cardioversion  SUBJECTIVE: Unable to wean vent due to hemodynamics.  Remains on multiple pressors.  VITAL SIGNS: Temp:  [97.2 F (36.2 C)-98.6 F (37 C)] 97.7 F (36.5 C) (04/21 0700) Pulse Rate:  [53-139] 120 (04/21 0700) Resp:  [0-30] 22 (04/21 0700) BP: (99-104)/(49-53) 99/49 mmHg (04/21 0316) SpO2:  [90 %-100 %] 99 % (04/21 0700) FiO2 (%):  [40 %-50 %] 50 % (04/21 0600) Weight:  [166 lb 0.1 oz (75.3 kg)] 166 lb 0.1 oz (75.3 kg) (04/21 0500) HEMODYNAMICS: PAP: (20-37)/(12-24) 28/21 mmHg CVP:  [6 mmHg-12 mmHg] 12 mmHg CO:  [4 L/min-5.6 L/min] 4.4 L/min CI:  [2.3 L/min/m2-3.3 L/min/m2] 2.5 L/min/m2 VENTILATOR SETTINGS: Vent Mode:  [-] PRVC FiO2 (%):  [40 %-50 %] 50 % Set Rate:  [16 bmp] 16 bmp Vt Set:  [600 mL] 600 mL PEEP:  [5 cmH20] 5 cmH20 Plateau Pressure:  [17 cmH20-18 cmH20] 17 cmH20 INTAKE / OUTPUT: Intake/Output      04/20 0701 - 04/21 0700 04/21 0701 - 04/22 0700   I.V. (mL/kg) 4370 (58) 140 (1.9)   Blood 568    Other 50.5    NG/GT 627.5 50   IV  Piggyback 900 50   Total Intake(mL/kg) 6516 (86.5) 240 (3.2)   Urine (mL/kg/hr) 4565 (2.5) 315 (3.4)   Emesis/NG output 0 (0)    Chest Tube 60 (0)    Total Output 4625 315   Net +1891 -75         PHYSICAL EXAMINATION: General: no distress Neuro: RASS -3, opens eyes with stimulation HEENT: ETT in place Cardiovascular: regular, tachycardic Lungs: scattered faint crackles Abdomen: soft, non tender  Musculoskeletal: 1+ edema Skin: no rashes  LABS:  CBC  Recent Labs Lab 09/20/14 0400 09/20/14 1530 09/20/14 1600 09/21/14 0420  WBC 9.9 10.0  --  9.5  HGB 9.1* 10.1* 10.2* 9.4*  HCT 25.9* 28.1* 30.0* 26.5*  PLT 63* 89*  --  90*     Coag's No results for input(s): APTT, INR in the last 168 hours.   BMET  Recent Labs Lab 09/19/14 0500  09/20/14 0400 09/20/14 1600 09/21/14 0420  NA 132*  < > 131* 130* 128*  K 3.8  < > 3.4* 3.7 3.5  CL 103  < > 98 91* 92*  CO2 23  --  26  --  26  BUN 11  < > CREATININE 1.03  < > 1.19 1.40* 1.52*  GLUCOSE 127*  < > 134* 153* 176*  < > = values in this interval not displayed.  Electrolytes  Recent Labs Lab 09/15/14 0319 09/16/14 0400  09/19/14 0500 09/20/14 0400 09/21/14 0420  CALCIUM 7.4* 7.4*  < > 7.3* 7.0* 7.0*  MG 1.6 1.5  --  1.7  --   --   PHOS 2.9 2.7  --  3.1  --   --   < > = values in this interval not displayed.   Sepsis Markers  Recent Labs Lab 09/14/14 1314 09/14/14 1600 09/14/14 2359  LATICACIDVEN 3.1* 3.1* 3.5*   ABG  Recent Labs Lab 09/20/14 1613 09/21/14 0405 09/21/14 0417  PHART 7.399 7.391 7.407  PCO2ART 40.1 41.7 41.6  PO2ART 93.0 65.0* 60.0*   Liver Enzymes  Recent Labs Lab 09/19/14 0500 09/20/14 0400 09/21/14 0420  AST 96* 63* 60*  ALT 24 24 24   ALKPHOS 80 83 99  BILITOT 12.1* 13.0*  13.3* 13.9*  ALBUMIN 1.9* 2.1* 2.4*   Cardiac Enzymes No results for input(s): TROPONINI, PROBNP in the last 168 hours.   Glucose  Recent Labs Lab 09/20/14 0750 09/20/14 1300  09/20/14 1530 09/20/14 2044 09/21/14 0016 09/21/14 0402  GLUCAP 115* 129* 145* 136* 181* 171*    Imaging Dg Chest Port 1 View  09/20/2014   CLINICAL DATA:  Coronary artery disease.  Status post CABG.  EXAM: PORTABLE CHEST - 1 VIEW  COMPARISON:  09/19/2014 and 09/18/2014  FINDINGS: Endotracheal tube, NG tube, Swan-Ganz catheter, aortic balloon catheter, and chest tubes remain in place, unchanged.  No pneumothorax. Resolution of slight interstitial edema. Heart size and vascularity are normal. Small bilateral effusions with minimal atelectasis at the left lung base.  IMPRESSION: Resolution of mild interstitial edema. Small residual effusions and minimal left base atelectasis.   Electronically Signed   By: Francene BoyersJames  Maxwell M.D.   On: 09/20/2014 07:50   ASSESSMENT / PLAN:  CARDIOVASCULAR Rt IJ introducer 4/13 >> Lt radial aline 4/13 >> A:  STEMI s/p CABG and PFO closure 4/13. Acute systolic CHF. Cardiogenic shock. A fib with RVR. Hx of HTN, HLD. P:  Continue ASA, lipitor Digoxin, amiodarone, lasix gtt per cardiology, TCTS Wean pressors for MAP goal > 60 Plan for impella removal 4/20 Hold outpt inderal  PULMONARY ETT 4/13 >> A: Acute hypoxic respiratory failure following CABG x 3. Hx COPD with continued tobacco abuse. P:   Full vent support >> unable to wean due to hemodynamic instability Continue scheduled pulmicort, duoneb with prn xopenex F/u CXR, ABG  RENAL A:   Hyponatremia in setting of acute systolic CHF. Hypokalemia. P:   Optimize hemodynamics Monitor renal fx, urine outpt  GASTROINTESTINAL A:   Nutrition. Hx of GERD. Hyperbilirubinemia. P:   Post-pyloric tube feeds Protonix for SUP Check direct/indirect bilirubin F/u LDH  HEMATOLOGIC A:   Anemia of critical illness. Thrombocytopenia. P:  F/u CBC Transfuse for Hb < 8 in setting of STEMI Heparin gtt  INFECTIOUS A:   Fever. P:   Day 3 of Abx, currently on vancomycin, fortaz  Sputum 4/21  >>  ENDOCRINE A:   Hyperglycemia. P:   SSI with levemir  NEUROLOGIC A:   Acute metabolic encephalopathy 2nd to cardiogenic shock, respiratory failure. Hx of Anxiety / Depression. P:   RASS goal -1 Continue precedex Wean off versed as tolerated  D/w Dr. Jones BroomBensihmon.  He does not seem to be making much progress.  CC time 35 minutes.  Coralyn HellingVineet Nicola Quesnell, MD [facility] 09/21/2014, 8:14 AM Pager:  [phone] After 3pm call: [phone]

## 2014-09-21 NOTE — Progress Notes (Signed)
Advanced Heart Failure Rounding Note   Subjective:    Currently sedated but responds to commands when awake.   Impella removed 4/20/ Underwent DC-CV for rapid AF again yesterday. In/out of AF on amio. BP low over night and lasix stopped. Remains on levophed 40. Epi 20 and milrinone 0.375.   CXR looks wet. Weight up 30 pounds from baseline. Cr 1.5   CVP 11 PA 29/20 (25)  PCWP 17 SVR 1126 Thermo 4.2 /2.5 Co-ox 66%   Objective:   Weight Range:  Vital Signs:   Temp:  [97.2 F (36.2 C)-98.6 F (37 C)] 97.7 F (36.5 C) (04/21 0700) Pulse Rate:  [53-139] 120 (04/21 0700) Resp:  [0-30] 22 (04/21 0700) BP: (99-104)/(49-53) 99/49 mmHg (04/21 0316) SpO2:  [90 %-100 %] 99 % (04/21 0700) FiO2 (%):  [40 %-50 %] 50 % (04/21 0600) Weight:  [75.3 kg (166 lb 0.1 oz)] 75.3 kg (166 lb 0.1 oz) (04/21 0500)    Weight change: Filed Weights   09/19/14 0445 09/20/14 0500 09/21/14 0500  Weight: 75.8 kg (167 lb 1.7 oz) 74.3 kg (163 lb 12.8 oz) 75.3 kg (166 lb 0.1 oz)    Intake/Output:   Intake/Output Summary (Last 24 hours) at 09/21/14 0800 Last data filed at 09/21/14 0736  Gross per 24 hour  Intake 6199.06 ml  Output   4525 ml  Net 1674.06 ml     Physical Exam: General:  Intubated sedated. Jaundiced HEENT: ETT. Scleral icterus.  Neck: supple.  RIJ swan Carotids 2+ bilat; no bruits. No lymphadenopathy or thryomegaly appreciated. Cor: Sternal dressing. +CTs PMI nondisplaced. Irregular rate & rhythm. Impella hum Lungs: clear Abdomen: soft, nontender, nondistended. No hepatosplenomegaly. No bruits or masses. Hypoactive bowel sounds. Extremities: no cyanosis, clubbing, rash, 2+ edema Impella site ok Neuro: alert & orientedx3, cranial nerves grossly intact. moves all 4 extremities w/o difficulty. Affect pleasant  Telemetry: SR 105  Labs: Basic Metabolic Panel:  Recent Labs Lab 09/14/14 1510  09/15/14 0319 09/16/14 0400 09/17/14 0353 09/18/14 0342  09/19/14 0500  09/19/14 1212 09/19/14 1708 09/20/14 0400 09/20/14 1600 09/21/14 0420  NA  --   < > 129* 128* 130* 129*  < > 132* 130* 134* 131* 130* 128*  K  --   < > 3.5 4.5 3.7 3.5  < > 3.8 4.1 3.7 3.4* 3.7 3.5  CL  --   < > 100 99 99 100  < > 103 99 96 98 91* 92*  CO2  --   < > --  23  --   --  26  --  26  GLUCOSE  --   < > 121* 100* 100* 112*  < > 127* 195* 85 134* 153* 176*  BUN  --   < > < > CREATININE 1.76*  < > 1.64* 1.43* 1.35 1.18  < > 1.03 1.10 1.20 1.19 1.40* 1.52*  CALCIUM  --   < > 7.4* 7.4* 7.3* 7.1*  --  7.3*  --   --  7.0*  --  7.0*  MG 1.7  --  1.6 1.5  --   --   --  1.7  --   --   --   --   --   PHOS  --   --  2.9 2.7  --   --   --  3.1  --   --   --   --   --   < > =  values in this interval not displayed.  Liver Function Tests:  Recent Labs Lab 09/17/14 0353 09/18/14 0342 09/19/14 0500 09/20/14 0400 09/21/14 0420  AST 132* 81* 96* 63* 60*  ALT ALKPHOS 93 66 80 83 99  BILITOT 4.6* 7.4* 12.1* 13.0*  13.3* 13.9*  PROT 3.5* 3.5* 4.1* 4.2* 4.9*  ALBUMIN 1.6* 1.5* 1.9* 2.1* 2.4*   No results for input(s): LIPASE, AMYLASE in the last 168 hours. No results for input(s): AMMONIA in the last 168 hours.  CBC:  Recent Labs Lab 09/18/14 0342  09/19/14 0500  09/19/14 1708 09/20/14 0400 09/20/14 1530 09/20/14 1600 09/21/14 0420  WBC 11.8*  --  14.4*  --   --  9.9 10.0  --  9.5  NEUTROABS  --   --  11.6*  --   --  7.9* 8.4*  --  8.0*  HGB 8.2*  < > 10.8*  < > 10.2* 9.1* 10.1* 10.2* 9.4*  HCT 23.7*  < > 30.5*  < > 30.0* 25.9* 28.1* 30.0* 26.5*  MCV 84.9  --  85.9  --   --  84.6 83.9  --  84.7  PLT 40*  --  54*  --   --  63* 89*  --  90*  < > = values in this interval not displayed.  Cardiac Enzymes: No results for input(s): CKTOTAL, CKMB, CKMBINDEX, TROPONINI in the last 168 hours.  BNP: BNP (last 3 results)  Recent Labs  09/13/14 1310  BNP 96.8    ProBNP (last 3 results) No results for input(s):  PROBNP in the last 8760 hours.    Other results:  Imaging: Dg Chest Port 1 View  09/21/2014   CLINICAL DATA:  Respiratory failure  EXAM: PORTABLE CHEST - 1 VIEW  COMPARISON:  09/20/2014  FINDINGS: Cardiac shadow is stable. Postoperative changes are again seen. An endotracheal tube is again noted 6.3 cm above the carina. A nasogastric catheter is seen within the stomach. Swan-Ganz catheter lies within the left pulmonary artery. Bilateral thoracostomy catheters are seen. No pneumothorax is noted. Bilateral posteriorly layering effusions are seen and increased from the prior exam. Mild changes of vascular congestion are seen.  IMPRESSION: Tubes and lines as described.  Increasing bilateral pleural effusions.  Mild vascular congestion.   Electronically Signed   By: Alcide Clever M.D.   On: 09/21/2014 07:27   Dg Chest Port 1 View  09/20/2014   CLINICAL DATA:  Coronary artery disease.  Status post CABG.  EXAM: PORTABLE CHEST - 1 VIEW  COMPARISON:  09/19/2014 and 09/18/2014  FINDINGS: Endotracheal tube, NG tube, Swan-Ganz catheter, aortic balloon catheter, and chest tubes remain in place, unchanged.  No pneumothorax. Resolution of slight interstitial edema. Heart size and vascularity are normal. Small bilateral effusions with minimal atelectasis at the left lung base.  IMPRESSION: Resolution of mild interstitial edema. Small residual effusions and minimal left base atelectasis.   Electronically Signed   By: Francene Boyers M.D.   On: 09/20/2014 07:50     Medications:     Scheduled Medications: . sodium chloride   Intravenous Once  . amiodarone  150 mg Intravenous Once  . antiseptic oral rinse  7 mL Mouth Rinse QID  . aspirin EC  325 mg Oral Daily   Or  . aspirin  324 mg Per Tube Daily  . atorvastatin  40 mg Oral q1800  . bisacodyl  10 mg Oral Daily   Or  . bisacodyl  10  mg Rectal Daily  . budesonide (PULMICORT) nebulizer solution  0.5 mg Nebulization BID  . cefTAZidime (FORTAZ)  IV  1 g  Intravenous Q8H  . chlorhexidine  15 mL Mouth Rinse BID  . digoxin  0.125 mg Intravenous Daily  . digoxin  0.125 mg Intravenous Once  . docusate sodium  200 mg Oral Daily  . feeding supplement (VITAL 1.5 CAL)  1,000 mL Per Tube Q24H  . insulin aspart  0-9 Units Subcutaneous 6 times per day  . insulin detemir  15 Units Subcutaneous BID  . ipratropium-albuterol  3 mL Nebulization Q6H  . pantoprazole (PROTONIX) IV  40 mg Intravenous Q24H  . potassium chloride  10 mEq Intravenous Q1 Hr x 3  . sodium chloride  3 mL Intravenous Q12H  . vancomycin  750 mg Intravenous Q12H    Infusions: . sodium chloride 10 mL/hr at 09/20/14 1745  . sodium chloride 250 mL (09/14/14 0611)  . amiodarone 60 mg/hr (09/21/14 0800)  . dexmedetomidine 1.2 mcg/kg/hr (09/21/14 0644)  . DOPamine 5 mcg/kg/min (09/21/14 0600)  . EPINEPHrine 4 mg in dextrose 5% 250 mL infusion (16 mcg/mL) 20 mcg/min (09/21/14 0600)  . furosemide (LASIX) infusion Stopped (09/20/14 2100)  . impella catheter heparin 50 unit/mL in dextrose 5%    . heparin 900 Units/hr (09/21/14 0600)  . lactated ringers Stopped (09/18/14 1204)  . lactated ringers 10 mL/hr at 09/18/14 1900  . lidocaine Stopped (09/15/14 0800)  . midazolam (VERSED) infusion 4 mg/hr (09/21/14 0600)  . milrinone 0.375 mcg/kg/min (09/21/14 0600)  . nitroGLYCERIN Stopped (09/13/14 1315)  . norepinephrine (LEVOPHED) Adult infusion 40 mcg/min (09/21/14 0644)    PRN Medications: sodium chloride, albumin human, levalbuterol, metoprolol, midazolam, morphine injection, nitroGLYCERIN, ondansetron (ZOFRAN) IV, oxyCODONE, sodium chloride, traMADol   Assessment:   1. Cardiogenic shock    -Impella CP in place 2. Acute systolic HF EF 25-30% by TEE 4/15 3. Acute anterolateral STEMI    --Emergent CABG 4/13 4. Acute respiratory failure 5. Thrombocytopenia 6. Acute renal failure, stage III 7. Hyponatremia 8. PAF with RVR 9. Hyperbilirubinemia  Plan/Discussion:    Impella out.  Hemodynamics looks surprisingly good but requirng very high dose pressors to achieve. Extensive third-spacing. In/out of AF complicating management. Continue amio. Add Ranexa. Restart lasix gtt. Decrease milrinone to 0.25. Continue heparin. May need to try midodrine. Renal function worse. Suspect due to transient hypotension. Will follow closely.   WBC falling on vancomycin.   Options increasingly limited. Hopefully he will improve with time. D/w Dr. Donata ClayVan Trigt.   The patient is critically ill with multiple organ systems failure and requires high complexity decision making for assessment and support, frequent evaluation and titration of therapies, application of advanced monitoring technologies and extensive interpretation of multiple databases.   Critical Care Time devoted to patient care services described in this note is 35 Minutes.   Length of Stay: 8 Aquarius Tremper MD  09/21/2014, 8:00 AM  Advanced Heart Failure Team Pager [phone] (M-F; 7a - 4p)  Please contact [facility] for night-coverage after hours (4p -7a ) and weekends on [email or web address]

## 2014-09-21 NOTE — Progress Notes (Signed)
CRITICAL VALUE ALERT  Critical value received: Vancomycin Trough 25.8  Date of notification:  09/21/14  Time of notification:  1106  Critical value read back: Yes  Nurse who received alert:  Jori Mollyan Manika Hast  MD notified (1st page):  Dr. Gala RomneyBensimhon   Time of first page:  1106  Responding MD:  Dr. Gala RomneyBensimhon  Time MD responded:  [phone]

## 2014-09-21 NOTE — Progress Notes (Signed)
ANTIBIOTIC CONSULT NOTE - INITIAL  Pharmacy Consult for vancomycin    Indication: rule out pneumonia and leukocytosis  Allergies  Allergen Reactions  . Penicillins Nausea And Vomiting    Patient Measurements: Height: 5\' 7"  (170.2 cm) Weight: 166 lb 0.1 oz (75.3 kg) IBW/kg (Calculated) : 66.1 Adjusted Body Weight: n/a  Vital Signs: Temp: 97.5 F (36.4 C) (04/21 1145) Temp Source: Core (Comment) (04/21 0800) BP: 99/49 mmHg (04/21 0316) Pulse Rate: 114 (04/21 1145) Intake/Output from previous day: 04/20 0701 - 04/21 0700 In: 6591 [I.V.:4445; Blood:568; NG/GT:627.5; IV Piggyback:900] Out: 4625 [id number][Urine:4565; Chest Tube:60] Intake/Output from this shift: Total I/O In: 1424.3 [I.V.:994.3; NG/GT:280; IV Piggyback:150] Out: 1525 [Urine:1525]  Labs:  Recent Labs  09/20/14 0400 09/20/14 1530 09/20/14 1600 09/21/14 0420  WBC 9.9 10.0  --  9.5  HGB 9.1* 10.1* 10.2* 9.4*  PLT 63* 89*  --  90*  CREATININE 1.19  --  1.40* 1.52*   Estimated Creatinine Clearance: 41.7 mL/min (by C-G formula based on Cr of 1.52).  Recent Labs  09/21/14 0900  VANCOTROUGH 25.8*     Microbiology: Recent Results (from the past 720 hour(s))  MRSA PCR Screening     Status: None   Collection Time: 09/13/14  4:51 PM  Result Value Ref Range Status   MRSA by PCR NEGATIVE NEGATIVE Final    Comment:        The GeneXpert MRSA Assay (FDA approved for NASAL specimens only), is one component of a comprehensive MRSA colonization surveillance program. It is not intended to diagnose MRSA infection nor to guide or monitor treatment for MRSA infections.     Medical History: Past Medical History  Diagnosis Date  . Dyspnea   . Palpitations   . Anxiety and depression   . GERD (gastroesophageal reflux disease)   . Anxiety   . Depression   . Hypertension   . Hypercholesteremia    Assessment: 74 yo male now off Impella, on vent with concern for pneumonia currently on day#2 of vancomycin and  fortaz. Scr continues to rise 1.1>1.4>1.5 this am. No fevers noted, wbc now normal.  Vancomycin trough this morning was supratherapeutic at 25.8. Will need to adjust dosing for tonight. Will also adjust fortaz given decrease in renal function.   4/21 resp cx - sent  Goal of Therapy:  Vancomycin trough level 15-20 mcg/ml  Plan:  1. Decrease Vancomycin to 1250mg  IV q24 hours 2. Decrease fortaz 1g q12 hour 3. F/u WBC, clinical course.  Sheppard CoilFrank Wilson PharmD., BCPS Clinical Pharmacist Pager [phone] 09/21/2014 12:21 PM

## 2014-09-21 NOTE — Progress Notes (Addendum)
Patient ID: [name], male   DOB: Oct 01, 1940, 74 y.o.   MRN: [id number] EVENING ROUNDS NOTE :     [address]       [place] [id number]             [phone]                 8 Days Post-Op Procedure(s) (LRB): CORONARY ARTERY BYPASS GRAFTING (CABG), ON PUMP, TIMES THREE, USING LEFT INTERNAL MAMMARY ARTERY, LEFT GREATER SAPHENOUS VEIN HARVESTED ENDOSCOPICALLY (N/A) PATENT FORAMEN OVALE CLOSURE  Total Length of Stay:  LOS: 8 days  BP 99/49 mmHg  Pulse 107  Temp(Src) 99 F (37.2 C) (Oral)  Resp 28  Ht 5\' 7"  (1.702 m)  Wt 166 lb 0.1 oz (75.3 kg)  BMI 25.99 kg/m2  SpO2 96%  .Intake/Output      04/21 0701 - 04/22 0700   I.V. (mL/kg) 2033.3 (27)   Blood    Other    NG/GT 700   IV Piggyback 200   Total Intake(mL/kg) 2933.3 (39)   Urine (mL/kg/hr) 3615 (3.8)   Emesis/NG output 250 (0.3)   Chest Tube 20 (0)   Total Output 3885   Net -951.7         . sodium chloride 10 mL/hr at 09/20/14 1745  . sodium chloride 250 mL (09/14/14 0611)  . amiodarone 60 mg/hr (09/21/14 1854)  . dexmedetomidine 1.2 mcg/kg/hr (09/21/14 1800)  . DOPamine 5 mcg/kg/min (09/21/14 1800)  . EPINEPHrine 4 mg in dextrose 5% 250 mL infusion (16 mcg/mL) 13 mcg/min (09/21/14 1855)  . furosemide (LASIX) infusion 4 mg/hr (09/21/14 1800)  . lactated ringers Stopped (09/18/14 1204)  . lactated ringers 10 mL/hr at 09/18/14 1900  . lidocaine Stopped (09/15/14 0800)  . midazolam (VERSED) infusion 4 mg/hr (09/21/14 1800)  . milrinone 0.25 mcg/kg/min (09/21/14 1800)  . norepinephrine (LEVOPHED) Adult infusion 40 mcg/min (09/21/14 1800)     Lab Results  Component Value Date   WBC 9.5 09/21/2014   HGB 10.5* 09/21/2014   HCT 31.0* 09/21/2014   PLT 90* 09/21/2014   GLUCOSE 158* 09/21/2014   ALT 24 09/21/2014   AST 60* 09/21/2014   NA 131* 09/21/2014   K 3.8 09/21/2014   CL 92* 09/21/2014   CREATININE 1.52* 09/21/2014   BUN 16 09/21/2014   CO2 26 09/21/2014   TSH 0.704 09/13/2014    INR 1.01 09/21/2014   HGBA1C 5.3 09/13/2014   Remains on vent Responds to name  [name] MD  Beeper [phone] Office [phone] 09/21/2014 7:34 PM

## 2014-09-21 NOTE — Progress Notes (Signed)
Peripherally Inserted Central Catheter/Midline Placement  The IV Nurse has discussed with the patient and/or persons authorized to consent for the patient, the purpose of this procedure and the potential benefits and risks involved with this procedure.  The benefits include less needle sticks, lab draws from the catheter and patient may be discharged home with the catheter.  Risks include, but not limited to, infection, bleeding, blood clot (thrombus formation), and puncture of an artery; nerve damage and irregular heat beat.  Alternatives to this procedure were also discussed.  PICC/Midline Placement Documentation    Phone consent obtained from daughter    Vevelyn PatDuncan, Lilana Blasko Jean 09/21/2014, 3:17 PM

## 2014-09-22 ENCOUNTER — Inpatient Hospital Stay (HOSPITAL_COMMUNITY): Payer: Medicare Other

## 2014-09-22 ENCOUNTER — Encounter (HOSPITAL_COMMUNITY): Payer: Self-pay | Admitting: Physician Assistant

## 2014-09-22 DIAGNOSIS — R17 Unspecified jaundice: Secondary | ICD-10-CM

## 2014-09-22 DIAGNOSIS — R7989 Other specified abnormal findings of blood chemistry: Secondary | ICD-10-CM

## 2014-09-22 DIAGNOSIS — N179 Acute kidney failure, unspecified: Secondary | ICD-10-CM

## 2014-09-22 DIAGNOSIS — K8020 Calculus of gallbladder without cholecystitis without obstruction: Secondary | ICD-10-CM

## 2014-09-22 LAB — POCT I-STAT 3, ART BLOOD GAS (G3+)
Acid-Base Excess: 2 mmol/L (ref 0.0–2.0)
Bicarbonate: 25.8 mEq/L — ABNORMAL HIGH (ref 20.0–24.0)
O2 Saturation: 94 %
PH ART: 7.446 (ref 7.350–7.450)
Patient temperature: 37.2
TCO2: 27 mmol/L (ref 0–100)
pCO2 arterial: 37.4 mmHg (ref 35.0–45.0)
pO2, Arterial: 67 mmHg — ABNORMAL LOW (ref 80.0–100.0)

## 2014-09-22 LAB — CBC WITH DIFFERENTIAL/PLATELET
Basophils Absolute: 0 10*3/uL (ref 0.0–0.1)
Basophils Relative: 0 % (ref 0–1)
Eosinophils Absolute: 0.1 10*3/uL (ref 0.0–0.7)
Eosinophils Relative: 1 % (ref 0–5)
HCT: 30.9 % — ABNORMAL LOW (ref 39.0–52.0)
Hemoglobin: 11.1 g/dL — ABNORMAL LOW (ref 13.0–17.0)
Lymphocytes Relative: 2 % — ABNORMAL LOW (ref 12–46)
Lymphs Abs: 0.3 10*3/uL — ABNORMAL LOW (ref 0.7–4.0)
MCH: 30.7 pg (ref 26.0–34.0)
MCHC: 35.9 g/dL (ref 30.0–36.0)
MCV: 85.4 fL (ref 78.0–100.0)
Monocytes Absolute: 1.3 10*3/uL — ABNORMAL HIGH (ref 0.1–1.0)
Monocytes Relative: 10 % (ref 3–12)
Neutro Abs: 11 10*3/uL — ABNORMAL HIGH (ref 1.7–7.7)
Neutrophils Relative %: 87 % — ABNORMAL HIGH (ref 43–77)
Platelets: 111 10*3/uL — ABNORMAL LOW (ref 150–400)
RBC: 3.62 MIL/uL — ABNORMAL LOW (ref 4.22–5.81)
RDW: 16.7 % — ABNORMAL HIGH (ref 11.5–15.5)
WBC: 12.6 10*3/uL — ABNORMAL HIGH (ref 4.0–10.5)

## 2014-09-22 LAB — TYPE AND SCREEN
ABO/RH(D): O POS
Antibody Screen: NEGATIVE
Unit division: 0

## 2014-09-22 LAB — POCT I-STAT, CHEM 8
BUN: 28 mg/dL — AB (ref 6–23)
CHLORIDE: 93 mmol/L — AB (ref 96–112)
Calcium, Ion: 1 mmol/L — ABNORMAL LOW (ref 1.13–1.30)
Creatinine, Ser: 1.8 mg/dL — ABNORMAL HIGH (ref 0.50–1.35)
Glucose, Bld: 96 mg/dL (ref 70–99)
HCT: 35 % — ABNORMAL LOW (ref 39.0–52.0)
Hemoglobin: 11.9 g/dL — ABNORMAL LOW (ref 13.0–17.0)
Potassium: 3.9 mmol/L (ref 3.5–5.1)
SODIUM: 132 mmol/L — AB (ref 135–145)
TCO2: 27 mmol/L (ref 0–100)

## 2014-09-22 LAB — COMPREHENSIVE METABOLIC PANEL
ALT: 35 U/L (ref 0–53)
AST: 75 U/L — ABNORMAL HIGH (ref 0–37)
Albumin: 2.1 g/dL — ABNORMAL LOW (ref 3.5–5.2)
Alkaline Phosphatase: 181 U/L — ABNORMAL HIGH (ref 39–117)
Anion gap: 10 (ref 5–15)
BUN: 19 mg/dL (ref 6–23)
CO2: 27 mmol/L (ref 19–32)
Calcium: 7 mg/dL — ABNORMAL LOW (ref 8.4–10.5)
Chloride: 94 mmol/L — ABNORMAL LOW (ref 96–112)
Creatinine, Ser: 1.68 mg/dL — ABNORMAL HIGH (ref 0.50–1.35)
GFR calc Af Amer: 46 mL/min — ABNORMAL LOW (ref >90)
GFR calc non Af Amer: 39 mL/min — ABNORMAL LOW (ref >90)
Glucose, Bld: 111 mg/dL — ABNORMAL HIGH (ref 70–99)
Potassium: 3.5 mmol/L (ref 3.5–5.1)
Sodium: 131 mmol/L — ABNORMAL LOW (ref 135–145)
Total Bilirubin: 16.2 mg/dL — ABNORMAL HIGH (ref 0.3–1.2)
Total Protein: 4.9 g/dL — ABNORMAL LOW (ref 6.0–8.3)

## 2014-09-22 LAB — BILIRUBIN, FRACTIONATED(TOT/DIR/INDIR)
BILIRUBIN INDIRECT: 5.9 mg/dL — AB (ref 0.3–0.9)
Bilirubin, Direct: 9.9 mg/dL — ABNORMAL HIGH (ref 0.0–0.5)
Total Bilirubin: 15.8 mg/dL — ABNORMAL HIGH (ref 0.3–1.2)

## 2014-09-22 LAB — GLUCOSE, CAPILLARY
GLUCOSE-CAPILLARY: 133 mg/dL — AB (ref 70–99)
GLUCOSE-CAPILLARY: 96 mg/dL (ref 70–99)
GLUCOSE-CAPILLARY: 103 mg/dL — AB (ref 70–99)
GLUCOSE-CAPILLARY: 86 mg/dL (ref 70–99)
GLUCOSE-CAPILLARY: 83 mg/dL (ref 70–99)
Glucose-Capillary: 137 mg/dL — ABNORMAL HIGH (ref 70–99)
Glucose-Capillary: 94 mg/dL (ref 70–99)

## 2014-09-22 LAB — LACTATE DEHYDROGENASE: LDH: 532 U/L — AB (ref 94–250)

## 2014-09-22 LAB — CARBOXYHEMOGLOBIN
Carboxyhemoglobin: 1.4 % (ref 0.5–1.5)
Methemoglobin: 0.9 % (ref 0.0–1.5)
O2 Saturation: 66.1 %
Total hemoglobin: 10.3 g/dL — ABNORMAL LOW (ref 13.5–18.0)

## 2014-09-22 MED ORDER — POTASSIUM CHLORIDE 10 MEQ/50ML IV SOLN
10.0000 meq | Freq: Once | INTRAVENOUS | Status: AC
  Administered 2014-09-22: 10 meq via INTRAVENOUS
  Filled 2014-09-22: qty 50

## 2014-09-22 MED ORDER — DOCUSATE SODIUM 50 MG/5ML PO LIQD
200.0000 mg | Freq: Every day | ORAL | Status: DC
  Administered 2014-09-22 – 2014-09-24 (×3): 200 mg
  Filled 2014-09-22 (×3): qty 20

## 2014-09-22 MED ORDER — ALBUMIN HUMAN 25 % IV SOLN
12.5000 g | Freq: Four times a day (QID) | INTRAVENOUS | Status: AC
  Administered 2014-09-22 – 2014-09-23 (×4): 12.5 g via INTRAVENOUS
  Filled 2014-09-22 (×4): qty 50

## 2014-09-22 MED ORDER — VITAL 1.5 CAL PO LIQD
1000.0000 mL | ORAL | Status: DC
Start: 2014-09-22 — End: 2014-10-05
  Administered 2014-09-23 – 2014-10-05 (×9): 1000 mL
  Filled 2014-09-22 (×17): qty 1000

## 2014-09-22 MED ORDER — POTASSIUM CHLORIDE 10 MEQ/50ML IV SOLN
INTRAVENOUS | Status: AC
  Administered 2014-09-22: 10 meq via INTRAVENOUS
  Filled 2014-09-22: qty 150

## 2014-09-22 MED ORDER — VANCOMYCIN HCL IN DEXTROSE 1-5 GM/200ML-% IV SOLN
1000.0000 mg | INTRAVENOUS | Status: DC
  Administered 2014-09-22 – 2014-09-24 (×3): 1000 mg via INTRAVENOUS
  Filled 2014-09-22 (×4): qty 200

## 2014-09-22 MED ORDER — POTASSIUM CHLORIDE 10 MEQ/50ML IV SOLN
10.0000 meq | INTRAVENOUS | Status: AC | PRN
  Administered 2014-09-22 (×3): 10 meq via INTRAVENOUS

## 2014-09-22 MED ORDER — MIDODRINE HCL 2.5 MG PO TABS
2.5000 mg | ORAL_TABLET | Freq: Three times a day (TID) | ORAL | Status: DC
  Administered 2014-09-22 – 2014-09-23 (×4): 2.5 mg via ORAL
  Filled 2014-09-22 (×7): qty 1

## 2014-09-22 MED ORDER — DIGOXIN 0.25 MG/ML IJ SOLN
0.1250 mg | INTRAMUSCULAR | Status: DC
  Administered 2014-09-23: 0.125 mg via INTRAVENOUS
  Filled 2014-09-22 (×2): qty 0.5

## 2014-09-22 NOTE — Progress Notes (Signed)
Dr Gala RomneyBensimhon has asked to hold TF until KUB and GI consult r/t absecence of bowel sounds   Carey BullocksJarrell, Jorge NyAshley Denise

## 2014-09-22 NOTE — Progress Notes (Signed)
Pt MAP < 60,  PAD 10-11, CVP 11, UOP 1.5l since 1900. Epi gtt increased to16. MAP rermain <60. Dr. Tyrone SageGerhardt notified. Orders for PRBC received. Will monitor need for volume after Blood administration.

## 2014-09-22 NOTE — Consult Note (Signed)
[facility] Consult: 10:55 AM 09/22/2014  LOS: 9 days    Referring Provider:  Dr Haroldine Laws Primary Care Physician:  Sherrie Mustache, MD Primary Gastroenterologist:  unassigned     Reason for Consultation:  Jaundice   HPI: [name] is a 74 y.o. male.  Hx COPD, GERD. .  Admitted 4/13 with acute STEMI, cardiogenic shock. S/p emergent CABG, PFO closure, Impella placement 4/13.  Lost pacemaker capture and had cardiac arrest 4/14.   8 minutes before ROSC to wide complex tachycardia, s/p cardioversion. Transfused multiple PRBCs (x 16, latest was today. Nadir Hgb 4.4 ~ 13 at admit) as well as platelets, FFP, cryoprecipitate. Impella removed 4/20.  But in Afib with RVR, again cardioverted.  Unable to wean from vent and on multiple pressors, milrinone gtt with MOSF.   At arrival 4/13 t bil 1.5, alk phos 23, AST/ALT 257/32, albumin 2.1.   Over time the t bili climbed steadily to 16.2 today, alk phos to 181, AST/ALT 75/35.  Albumin 2.1, prealbumin 7.  LDH maxed at 1161 on 4/19 and is down to 524 yesterday.  coags prolonged to 27/2.4 on 4/13  today 13.4/1.01.  Abdominal ultrasound today: GB stones, mild GB wall thickening.  Biliary tree not dilated.  Liver normal.   Daughter says pt does not drink ETOH and no hx of heavy consumption. No hx liver problems. Not clear if he ever had EGD/colonoscopy     Past Medical History  Diagnosis Date  . Dyspnea   . Palpitations   . Anxiety and depression   . GERD (gastroesophageal reflux disease)   . Anxiety   . Depression   . Hypertension   . Hypercholesteremia     Past Surgical History  Procedure Laterality Date  . Appendectomy    . Cataract extraction w/phaco  05/06/2012    Procedure: CATARACT EXTRACTION PHACO AND INTRAOCULAR LENS PLACEMENT (IOC);   Surgeon: Tonny Branch, MD;  Location: [facility];  Service: Ophthalmology;  Laterality: Right;  CDE:22.64  . Cataract extraction w/phaco  05/24/2012    Procedure: CATARACT EXTRACTION PHACO AND INTRAOCULAR LENS PLACEMENT (IOC);  Surgeon: Tonny Branch, MD;  Location: [facility];  Service: Ophthalmology;  Laterality: Left;  CDE: 27.08  . Cardiac catheterization  09/13/2014    Procedure: VENTRICULAR ASSIST DEVICE INSERTION;  Surgeon: Jettie Booze, MD;  Location: [facility] CATH LAB;  Service: Cardiovascular;;  . Cardiac catheterization  09/13/2014    Procedure: CORONARY BALLOON ANGIOPLASTY;  Surgeon: Jettie Booze, MD;  Location: [facility] CATH LAB;  Service: Cardiovascular;;  . Coronary angiogram  09/13/2014    Procedure: CORONARY ANGIOGRAM;  Surgeon: Jettie Booze, MD;  Location: [facility] CATH LAB;  Service: Cardiovascular;;  . Coronary artery bypass graft N/A 09/13/2014    Procedure: CORONARY ARTERY BYPASS GRAFTING (CABG), ON PUMP, TIMES THREE, USING LEFT INTERNAL MAMMARY ARTERY, LEFT GREATER SAPHENOUS VEIN HARVESTED ENDOSCOPICALLY;  Surgeon: Ivin Poot, MD;  Location: [place];  Service: Open Heart Surgery;  Laterality: N/A;  . Patent foramen ovale closure  09/13/2014    Procedure: PATENT FORAMEN OVALE CLOSURE;  Surgeon: Ivin Poot, MD;  Location: [facility] OR;  Service: Open Heart Surgery;;    Prior to Admission medications   Medication Sig Start Date End Date Taking? Authorizing Provider  ALPRAZolam Duanne Moron) 0.5 MG tablet Take 0.5 mg by mouth 3 (three) times daily as needed. Anxiety.   Yes Historical Provider, MD  escitalopram (LEXAPRO) 10 MG tablet Take 15 mg by mouth daily.   Yes Historical Provider, MD  pantoprazole (PROTONIX) 40 MG tablet Take 40 mg by mouth daily.   Yes Historical Provider, MD  potassium chloride SA (K-DUR,KLOR-CON) 20 MEQ tablet Take 20 mEq by mouth daily.   Yes Historical Provider, MD  propranolol ER (INDERAL LA) 120 MG 24 hr capsule Take 120 mg by mouth 2 (two) times daily.    Yes  Historical Provider, MD  simvastatin (ZOCOR) 20 MG tablet Take 20 mg by mouth every evening.   Yes Historical Provider, MD  Tamsulosin HCl (FLOMAX) 0.4 MG CAPS Take 0.4 mg by mouth at bedtime.    Yes Historical Provider, MD  tiotropium (SPIRIVA) 18 MCG inhalation capsule Place 18 mcg into inhaler and inhale daily.   Yes Historical Provider, MD    Scheduled Meds: . sodium chloride   Intravenous Once  . albumin human  12.5 g Intravenous Q6H  . amiodarone  150 mg Intravenous Once  . antiseptic oral rinse  7 mL Mouth Rinse QID  . aspirin EC  325 mg Oral Daily   Or  . aspirin  324 mg Per Tube Daily  . atorvastatin  40 mg Oral q1800  . bisacodyl  10 mg Oral Daily   Or  . bisacodyl  10 mg Rectal Daily  . budesonide (PULMICORT) nebulizer solution  0.5 mg Nebulization BID  . cefTAZidime (FORTAZ)  IV  1 g Intravenous Q8H  . chlorhexidine  15 mL Mouth Rinse BID  . [START ON 09/23/2014] digoxin  0.125 mg Intravenous QODAY  . docusate sodium  200 mg Oral Daily  . enoxaparin (LOVENOX) injection  40 mg Subcutaneous Daily  . feeding supplement (VITAL 1.5 CAL)  1,000 mL Per Tube Q24H  . insulin aspart  0-9 Units Subcutaneous 6 times per day  . insulin detemir  15 Units Subcutaneous BID  . midodrine  2.5 mg Oral TID WC  . pantoprazole (PROTONIX) IV  40 mg Intravenous Q24H  . sodium chloride  10-40 mL Intracatheter Q12H  . sodium chloride  3 mL Intravenous Q12H  . vancomycin  1,250 mg Intravenous Q24H   Infusions: . sodium chloride 10 mL/hr at 09/22/14 0600  . sodium chloride 250 mL (09/22/14 0600)  . amiodarone 30 mg/hr (09/22/14 0827)  . dexmedetomidine 1 mcg/kg/hr (09/22/14 0810)  . DOPamine 5 mcg/kg/min (09/22/14 0800)  . EPINEPHrine 4 mg in dextrose 5% 250 mL infusion (16 mcg/mL) 9.5 mcg/min (09/22/14 0800)  . lactated ringers Stopped (09/18/14 1204)  . lactated ringers 10 mL/hr at 09/22/14 0600  . lidocaine Stopped (09/15/14 0800)  . midazolam (VERSED) infusion 2 mg/hr (09/22/14 0800)    . milrinone 0.25 mcg/kg/min (09/22/14 0809)  . norepinephrine (LEVOPHED) Adult infusion 40 mcg/min (09/22/14 0809)   PRN Meds: sodium chloride, albumin human, ipratropium-albuterol, levalbuterol, metoprolol, midazolam, morphine injection, ondansetron (ZOFRAN) IV, oxyCODONE, sodium chloride, sodium chloride, traMADol   Allergies as of 09/13/2014 - Review Complete 09/13/2014  Allergen Reaction Noted  . Penicillins Nausea And Vomiting 04/19/2012    Family History  Problem Relation Age of Onset  . Coronary artery disease  History   Social History  . Marital Status: Divorced    Spouse Name: N/A  . Number of Children: N/A  . Years of Education: N/A   Occupational History  . retired    Social History Main Topics  . Smoking status: Current Every Day Smoker -- 1.00 packs/day for 40 years    Types: Cigarettes  . Smokeless tobacco: Not on file  . Alcohol Use: No  . Drug Use: Not on file  . Sexual Activity: Yes    Birth Control/ Protection: None   Other Topics Concern  . Not on file   Social History Narrative    REVIEW OF SYSTEMS: Unable to obtain.  Pt's last BM was before surgery.  Tube feeds off since last PM to allow for ultrasound.     PHYSICAL EXAM: Vital signs in last 24 hours: Filed Vitals:   09/22/14 1030  BP:   Pulse: 129  Temp: 98.6 F (37 C)  Resp: 22   Wt Readings from Last 3 Encounters:  09/22/14 164 lb 7.4 oz (74.6 kg)  05/18/12 130 lb (58.968 kg)    General: ill, hooked up to multiple lines, ETT in place on vent.  jaundiced Head:  jaundiced  Eyes:  icteric Ears:  Unable to assess hearing  Nose:  No discharge,  Fine gage feeding tube in place Mouth:  Intubated, no blood at lips Neck:  Line on right Lungs:  Clear.  On vent. 2 tubes draining mediastinum Heart:  Tachy, irregular, in a fib on monitor Abdomen:  Non-distended but tense.  BS quiet.  Not tender.   Rectal: deferred   Musc/Skeltl: no joint  Contractures or swelling Extremities:   No pedal edema.  + UE edema.   Neurologic:  Opens eyes to voice.  Not following commands.  No tremor.  Skin:  Jaundiced, multiple petechia and purpura Tattoos:  None seen    Intake/Output from previous day: 04/21 0701 - 04/22 0700 In: 6392.7 [I.V.:4377.7; Blood:335; NG/GT:1030; IV Piggyback:650] Out: 2992 [Urine:7040; Emesis/NG output:300; Chest Tube:20] Intake/Output this shift: Total I/O In: 708.8 [I.V.:518.8; NG/GT:90; IV Piggyback:100] Out: 1075 [Urine:975; Emesis/NG output:100]  LAB RESULTS:  Recent Labs  09/20/14 1530  09/21/14 0420 09/21/14 1800 09/22/14 0230  WBC 10.0  --  9.5  --  12.6*  HGB 10.1*  < > 9.4* 10.5* 11.1*  HCT 28.1*  < > 26.5* 31.0* 30.9*  PLT 89*  --  90*  --  111*  < > = values in this interval not displayed. BMET Lab Results  Component Value Date   NA 131* 09/22/2014   NA 131* 09/21/2014   NA 128* 09/21/2014   K 3.5 09/22/2014   K 3.8 09/21/2014   K 3.5 09/21/2014   CL 94* 09/22/2014   CL 92* 09/21/2014   CL 91* 09/20/2014   CO2 27 09/22/2014   CO2 26 09/21/2014   CO2 26 09/20/2014   GLUCOSE 111* 09/22/2014   GLUCOSE 158* 09/21/2014   GLUCOSE 176* 09/21/2014   BUN 19 09/22/2014   BUN 16 09/21/2014   BUN 16 09/20/2014   CREATININE 1.68* 09/22/2014   CREATININE 1.52* 09/21/2014   CREATININE 1.40* 09/20/2014   CALCIUM 7.0* 09/22/2014   CALCIUM 7.0* 09/21/2014   CALCIUM 7.0* 09/20/2014   LFT  Recent Labs  09/20/14 0400 09/21/14 0420 09/22/14 0230  PROT 4.2* 4.9* 4.9*  ALBUMIN 2.1* 2.4* 2.1*  AST 63* 60* 75*  ALT 24 24 35  ALKPHOS 83 99 181*  BILITOT 13.0*  13.3* 13.9* 16.2*  BILIDIR 8.3*  --   --   IBILI 4.7*  --   --    PT/INR Lab Results  Component Value Date   INR 1.01 09/21/2014   INR 1.60* 09/14/2014   INR 2.48* 09/13/2014     RADIOLOGY STUDIES: Dg Chest Port 1 View 09/22/2014   CLINICAL DATA:  CHF  EXAM: PORTABLE CHEST - 1 VIEW  COMPARISON:  Yesterday  FINDINGS: Right PICC placed. Tip is at the cavoatrial  junction. Tubular devices otherwise stable. Bilateral central and basilar opacity stable, likely a combination of airspace disease and pleural fluid. Normal heart size. No pneumothorax.  IMPRESSION: Right PICC placed to the cavoatrial junction.  Stable bibasilar opacity likely a combination of airspace disease and fluid.   Electronically Signed   By: Marybelle Killings M.D.   On: 09/22/2014 07:56   Dg Abd Portable 1v 09/22/2014   CLINICAL DATA:  Ileus.  EXAM: PORTABLE ABDOMEN - 1 VIEW  COMPARISON:  09/17/2014  FINDINGS: Nasogastric tube has tip and side-port over the stomach in the left upper quadrant to midline abdomen. Double feeding tube is present with tip just right of midline likely over the distal stomach. The ends of patient's chest tubes are projected over the mid abdomen. Small caliber catheter projected over the rectum.  Bowel gas pattern is nonobstructive. Remainder of the exam is unchanged.  IMPRESSION: Nonobstructive bowel gas pattern.  Tubes and lines as described.   Electronically Signed   By: Marin Olp M.D.   On: 09/22/2014 09:42   US Abdomen Limited Ruq 09/22/2014   CLINICAL DATA:  74 year old male with jaundice. Initial encounter.  EXAM: US ABDOMEN LIMITED - RIGHT UPPER QUADRANT  COMPARISON:  Chest radiographs 09/21/2014 and earlier  FINDINGS: Gallbladder:  Positive for shadowing gallstones, individually measuring up to 7 mm. The gallbladder wall appears mildly thickened up to 3-4 mm. The patient is sedated, sonographic Percell Miller sign could not be elicited. No pericholecystic fluid identified.  Common bile duct:  Diameter: 3 mm, normal  Liver:  No focal lesion identified. Within normal limits in parenchymal echogenicity. No intrahepatic biliary ductal dilatation. No discrete liver lesion identified.  Other findings: Negative visible right kidney  IMPRESSION: 1. Gallstones and up to mild gallbladder wall thickening. Consider Acute Cholecystitis. If the clinical course is equivocal nuclear medicine  hepatobiliary scan may be valuable to characterize cystic duct patency. 2. No intra or extra biliary ductal enlargement identified to suggest acute biliary obstruction.   Electronically Signed   By: Genevie Ann M.D.   On: 09/22/2014 07:52    ENDOSCOPIC STUDIES: Nothing in Epic or Care Everywhere  IMPRESSION:   *  Jaundice. Majority of bili is direct/conjugated. ? Hemolysis with the elevated LDH.   Currently normal PT/INR though were acutely elevated within several hours of his admission. No parenchymal liver disease but + gallstones and GB wall thickening on ultrasound.    *  Acute STEMI, s/p emergent CABG/PFO repair/Impela placement (4/13-4/20), post op cardiogenic shock, arrythmia/tachycardia requiring coardiversion. Still intubated, on multiple pressors.   *  Afib.  On Lovenox.     *  ABL anemia.  S/p 15 PRBCs thus far.  *  Coagulopathy, thrombocytopenia.  S/p transfusion with multiples of FFP. Platelets.    PLAN:     *  Per Dr Hilarie Fredrickson.     Azucena Freed  09/22/2014, 10:55 AM Pager: [phone]

## 2014-09-22 NOTE — Progress Notes (Signed)
Pt rhythm A-fib since 02:30. MAP>65,  AM Labs attained. Epi gtt decreased to 9 mcgs. Pt remains in A-fib. Dr Sandie AnoAlverez notified will continue to monitor.

## 2014-09-22 NOTE — Progress Notes (Signed)
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  PCCM consulted post-op for assistance with shock and vent management.  STUDIES:  4/13 cardiac cath 4/13 Echo >> EF 35%  SIGNIFICANT EVENTS: 4/13 CABG, closure of PFO, impella, VDRF, cardiogenic shock  4/14 Lost pacemaker capture >> Cardiac arrest >> 8 minutes before ROSC >> wide complex tachycardia >> cardioversion; Hb 5 >> transfused PRBC 4/18 A fib with RVR >> amiodarone added 4/19 Oxygen desaturation >> suctioned mucus plug; a fib with RVR >> cardioversion 4/20 Impella out; A fib with RVR >> cardioversion  SUBJECTIVE: Unable to wean vent due to hemodynamics.  Remains on multiple pressors.  VITAL SIGNS: Temp:  [90 F (32.2 C)-99.9 F (37.7 C)] 99 F (37.2 C) (04/22 0815) Pulse Rate:  [101-140] 107 (04/22 0815) Resp:  [16-33] 23 (04/22 0815) BP: (87-104)/(45-56) 87/56 mmHg (04/22 0813) SpO2:  [93 %-100 %] 100 % (04/22 0815) FiO2 (%):  [40 %] 40 % (04/22 0813) Weight:  [74.6 kg (164 lb 7.4 oz)] 74.6 kg (164 lb 7.4 oz) (04/22 0400) HEMODYNAMICS: PAP: (22-36)/(10-28) 32/23 mmHg CVP:  [9 mmHg-15 mmHg] 15 mmHg CO:  [4 L/min-5.2 L/min] 4 L/min CI:  [2.4 L/min/m2-3 L/min/m2] 2.4 L/min/m2 VENTILATOR SETTINGS: Vent Mode:  [-] PRVC FiO2 (%):  [40 %] 40 % Set Rate:  [16 bmp] 16 bmp Vt Set:  [600 mL] 600 mL PEEP:  [5 cmH20] 5 cmH20 Plateau Pressure:  [18 cmH20-20 cmH20] 20 cmH20 INTAKE / OUTPUT: Intake/Output      04/21 0701 - 04/22 0700 04/22 0701 - 04/23 0700   I.V. (mL/kg) 4377.7 (58.7) 183.6 (2.5)   Blood 335    Other     NG/GT 1030 30   IV Piggyback 650     Total Intake(mL/kg) 6392.7 (85.7) 213.6 (2.9)   Urine (mL/kg/hr) 7040 (3.9) 350 (2.4)   Emesis/NG output 300 (0.2) 100 (0.7)   Chest Tube 20 (0) 0 (0)   Total Output 7360 450   Net -967.3 -236.4         PHYSICAL EXAMINATION: General: Acute on chronic respiratory failure. Neuro: RASS -3, opens eyes with stimulation but not following commands this AM HEENT: ETT in place Cardiovascular: regular, tachycardic Lungs: scattered faint crackles Abdomen: soft, non tender  Musculoskeletal: 1+ edema Skin: no rashes  LABS:  CBC  Recent Labs Lab 09/20/14 1530  09/21/14 0420 09/21/14 1800 09/22/14 0230  WBC 10.0  --  9.5  --  12.6*  HGB 10.1*  < > 9.4* 10.5* 11.1*  HCT 28.1*  < > 26.5* 31.0* 30.9*  PLT 89*  --  90*  --  111*  < > = values in this interval not displayed.   Coag's  Recent Labs Lab 09/21/14 0900  INR 1.01    BMET  Recent Labs Lab 09/20/14 0400 09/20/14 1600 09/21/14 0420 09/21/14 1800 09/22/14 0230  NA 131* 130* 128* 131* 131*  K 3.4* 3.7 3.5 3.8 3.5  CL 98 91* 92*  --  94*  CO2 26  --  26  --  27  BUN 12 16 16   --  19  CREATININE  1.19 1.40* 1.52*  --  1.68*  GLUCOSE 134* 153* 176* 158* 111*     Electrolytes  Recent Labs Lab 09/16/14 0400  09/19/14 0500 09/20/14 0400 09/21/14 0420 09/22/14 0230  CALCIUM 7.4*  < > 7.3* 7.0* 7.0* 7.0*  MG 1.5  --  1.7  --   --   --   PHOS 2.7  --  3.1  --   --   --   < > = values in this interval not displayed.   Sepsis Markers No results for input(s): LATICACIDVEN, PROCALCITON, O2SATVEN in the last 168 hours. ABG  Recent Labs Lab 09/21/14 0405 09/21/14 0417 09/22/14 0420  PHART 7.391 7.407 7.446  PCO2ART 41.7 41.6 37.4  PO2ART 65.0* 60.0* 67.0*   Liver Enzymes  Recent Labs Lab 09/20/14 0400 09/21/14 0420 09/22/14 0230  AST 63* 60* 75*  ALT 24 24 35  ALKPHOS 83 99 181*  BILITOT 13.0*  13.3* 13.9* 16.2*  ALBUMIN 2.1* 2.4* 2.1*   Cardiac Enzymes No results for input(s): TROPONINI,  PROBNP in the last 168 hours.   Glucose  Recent Labs Lab 09/20/14 2044 09/21/14 0016 09/21/14 0402 09/21/14 0750 09/21/14 1202 09/21/14 1607  GLUCAP 136* 181* 171* 151* 152* 108*   Imaging Dg Chest Port 1 View  09/21/2014   CLINICAL DATA:  Respiratory failure  EXAM: PORTABLE CHEST - 1 VIEW  COMPARISON:  09/20/2014  FINDINGS: Cardiac shadow is stable. Postoperative changes are again seen. An endotracheal tube is again noted 6.3 cm above the carina. A nasogastric catheter is seen within the stomach. Swan-Ganz catheter lies within the left pulmonary artery. Bilateral thoracostomy catheters are seen. No pneumothorax is noted. Bilateral posteriorly layering effusions are seen and increased from the prior exam. Mild changes of vascular congestion are seen.  IMPRESSION: Tubes and lines as described.  Increasing bilateral pleural effusions.  Mild vascular congestion.   Electronically Signed   By: Alcide Clever M.D.   On: 09/21/2014 07:27   ASSESSMENT / PLAN:  CARDIOVASCULAR Rt IJ introducer 4/13 >> Lt radial aline 4/13 >> A:  STEMI s/p CABG and PFO closure 4/13. Acute systolic CHF. Cardiogenic shock. A fib with RVR. Hx of HTN, HLD. P:  Continue ASA, lipitor Digoxin, amiodarone, lasix gtt per cardiology, TCTS Wean pressors for MAP goal > 60, Norepi 40 and Epi 9.5 Impella out 4/20 Hold outpt inderal  PULMONARY ETT 4/13 >> A: Acute hypoxic respiratory failure following CABG x 3. Hx COPD with continued tobacco abuse. P:   Full vent support >> unable to wean due to hemodynamic instability Continue scheduled pulmicort, duoneb with prn xopenex F/u CXR, ABG  RENAL A:   Hyponatremia in setting of acute systolic CHF. Hypokalemia. Cr up to 1.68 P:   Optimize hemodynamics Monitor renal fx, urine outpt Hold further diureses now that PCWP is 12  GASTROINTESTINAL A:   Nutrition. Hx of GERD. Hyperbilirubinemia. P:   Post-pyloric tube feeds on hold, hypoactive BS this AM Protonix  for SUP Direct 8.3 and indirect 4.7 bilirubinemia, likely related to hemolysis. LDH 524 so likely hemolysis driven.  HEMATOLOGIC A:   Anemia of critical illness. Thrombocytopenia. P:  F/u CBC Transfuse for Hb < 8 in setting of STEMI Heparin gtt  INFECTIOUS A:   Fever. P:   Day 4 of Abx, currently on vancomycin, fortaz  Sputum 4/21 >>NTD  ENDOCRINE A:   Hyperglycemia. P:   SSI with levemir  NEUROLOGIC A:   Acute metabolic encephalopathy 2nd to cardiogenic shock, respiratory failure.  Hx of Anxiety / Depression. P:   RASS goal -1 Continue precedex Wean off versed as tolerated  D/w Dr. Jones Broom.  He does not seem to be making much progress.  He will be discussing with Dr. Alla German and will need to meet with family and discuss plan of care.  The patient is critically ill with multiple organ systems failure and requires high complexity decision making for assessment and support, frequent evaluation and titration of therapies, application of advanced monitoring technologies and extensive interpretation of multiple databases.   Critical Care Time devoted to patient care services described in this note is  35  Minutes. This time reflects time of care of this signee Dr Koren Bound. This critical care time does not reflect procedure time, or teaching time or supervisory time of PA/NP/Med student/Med Resident etc but could involve care discussion time.  Alyson Reedy, M.D. [facility] Pulmonary/Critical Care Medicine. Pager: [phone]. After hours pager: [phone].  09/22/2014, 8:57 AM

## 2014-09-22 NOTE — Progress Notes (Signed)
9 Days Post-Op Procedure(s) (LRB): CORONARY ARTERY BYPASS GRAFTING (CABG), ON PUMP, TIMES THREE, USING LEFT INTERNAL MAMMARY ARTERY, LEFT GREATER SAPHENOUS VEIN HARVESTED ENDOSCOPICALLY (N/A) PATENT FORAMEN OVALE CLOSURE Subjective: Remains jaundiced from both hemolysis from LVAD and probable shock liver Co-ox remains greater than 60% Serous bilateral chest tube drainage Creatinine increased to 1.8-Lasix drip stopped Objective: Vital signs in last 24 hours: Temp:  [90 F (32.2 C)-99.9 F (37.7 C)] 99 F (37.2 C) (04/22 2030) Pulse Rate:  [100-134] 103 (04/22 2030) Cardiac Rhythm:  [-] Sinus tachycardia (04/22 2000) Resp:  [0-33] 19 (04/22 2030) BP: (87-124)/(48-56) 122/53 mmHg (04/22 1943) SpO2:  [93 %-100 %] 96 % (04/22 2030) FiO2 (%):  [40 %] 40 % (04/22 2000) Weight:  [164 lb 7.4 oz (74.6 kg)] 164 lb 7.4 oz (74.6 kg) (04/22 0400)  Hemodynamic parameters for last 24 hours: PAP: (22-36)/(10-28) 25/16 mmHg CVP:  [3 mmHg-12 mmHg] 5 mmHg PCWP:  [12 mmHg] 12 mmHg CO:  [3.6 L/min-5.2 L/min] 4.6 L/min CI:  [2.1 L/min/m2-3 L/min/m2] 2.7 L/min/m2  Intake/Output from previous day: 04/21 0701 - 04/22 0700 In: 6392.7 [I.V.:4377.7; Blood:335; NG/GT:1030; IV Piggyback:650] Out: 7360 [Urine:7040; Emesis/NG output:300; Chest Tube:20] Intake/Output this shift: Total I/O In: 255.6 [I.V.:145.6; NG/GT:60; IV Piggyback:50] Out: 110 [Urine:100; Chest Tube:10]    Lab Results:  Recent Labs  09/21/14 0420  09/22/14 0230 09/22/14 1550  WBC 9.5  --  12.6*  --   HGB 9.4*  < > 11.1* 11.9*  HCT 26.5*  < > 30.9* 35.0*  PLT 90*  --  111*  --   < > = values in this interval not displayed. BMET:  Recent Labs  09/21/14 0420  09/22/14 0230 09/22/14 1550  NA 128*  < > 131* 132*  K 3.5  < > 3.5 3.9  CL 92*  --  94* 93*  CO2 26  --  27  --   GLUCOSE 176*  < > 111* 96  BUN 16  --  19 28*  CREATININE 1.52*  --  1.68* 1.80*  CALCIUM 7.0*  --  7.0*  --   < > = values in this interval not  displayed.  PT/INR:  Recent Labs  09/21/14 0900  LABPROT 13.4  INR 1.01   ABG    Component Value Date/Time   PHART 7.446 09/22/2014 0420   HCO3 25.8* 09/22/2014 0420   TCO2 27 09/22/2014 1550   ACIDBASEDEF 2.0 09/19/2014 1208   O2SAT 66.1 09/22/2014 0422   CBG (last 3)   Recent Labs  09/22/14 1150 09/22/14 1545 09/22/14 1951  GLUCAP 103* 86 83    Assessment/Plan: S/P Procedure(s) (LRB): CORONARY ARTERY BYPASS GRAFTING (CABG), ON PUMP, TIMES THREE, USING LEFT INTERNAL MAMMARY ARTERY, LEFT GREATER SAPHENOUS VEIN HARVESTED ENDOSCOPICALLY (N/A) PATENT FORAMEN OVALE CLOSURE Diabetes control Continue foley due to urinary output monitoring Wean epinephrine as tolerated  LOS: 9 days    Kathlee NationsPeter Van Trigt III 09/22/2014

## 2014-09-22 NOTE — Progress Notes (Signed)
Advanced Heart Failure Rounding Note   Subjective:    Currently sedated but responds to commands when awake.   Impella removed 4/20. Diuresed well overnight but still markedly edematous. Remains on levophed 40. Epi weaned down to 9.5. Milrinone at 0.25. BP soft  Cr up to 1.7. Bili up to 16. (over half conjugated). RUQ u/s done this am   CVP 6 PA 22/16 (20)  PCWP 12 SVR 1300 Thermo 3.6 /2.1 Co-ox 66%   Objective:   Weight Range:  Vital Signs:   Temp:  [90 F (32.2 C)-99.9 F (37.7 C)] 98.8 F (37.1 C) (04/22 0900) Pulse Rate:  [101-140] 115 (04/22 0900) Resp:  [16-33] 23 (04/22 0900) BP: (87-104)/(45-56) 87/56 mmHg (04/22 0813) SpO2:  [93 %-100 %] 99 % (04/22 0900) FiO2 (%):  [40 %] 40 % (04/22 0813) Weight:  [74.6 kg (164 lb 7.4 oz)] 74.6 kg (164 lb 7.4 oz) (04/22 0400)    Weight change: Filed Weights   09/20/14 0500 09/21/14 0500 09/22/14 0400  Weight: 74.3 kg (163 lb 12.8 oz) 75.3 kg (166 lb 0.1 oz) 74.6 kg (164 lb 7.4 oz)    Intake/Output:   Intake/Output Summary (Last 24 hours) at 09/22/14 0904 Last data filed at 09/22/14 0900  Gross per 24 hour  Intake 6166.23 ml  Output   7535 ml  Net -1368.77 ml     Physical Exam: General:  Intubated sedated. Jaundiced HEENT: ETT. Scleral icterus.  Neck: supple.  RIJ swan Carotids 2+ bilat; no bruits. No lymphadenopathy or thryomegaly appreciated. Cor: Sternal dressing. +CTs PMI nondisplaced. Irregular rate & rhythm. Lungs: clear Abdomen: soft, nontender, + distended. No hepatosplenomegaly. No bruits or masses. Hypoactive bowel sounds. Extremities: no cyanosis, clubbing, rash, 3+ edema Neuro: alert & orientedx3, cranial nerves grossly intact. moves all 4 extremities w/o difficulty. Affect pleasant  Telemetry:AF 115-130  Labs: Basic Metabolic Panel:  Recent Labs Lab 09/16/14 0400  09/18/14 0342  09/19/14 0500  09/19/14 1708 09/20/14 0400 09/20/14 1600 09/21/14 0420 09/21/14 1800 09/22/14 0230  NA 128*   < > 129*  < > 132*  < > 134* 131* 130* 128* 131* 131*  K 4.5  < > 3.5  < > 3.8  < > 3.7 3.4* 3.7 3.5 3.8 3.5  CL 99  < > 100  < > 103  < > 96 98 91* 92*  --  94*  CO2 21  < > 23  --  23  --   --  26  --  26  --  27  GLUCOSE 100*  < > 112*  < > 127*  < > 85 134* 153* 176* 158* 111*  BUN 13  < > 12  < > 11  < > --  19  CREATININE 1.43*  < > 1.18  < > 1.03  < > 1.20 1.19 1.40* 1.52*  --  1.68*  CALCIUM 7.4*  < > 7.1*  --  7.3*  --   --  7.0*  --  7.0*  --  7.0*  MG 1.5  --   --   --  1.7  --   --   --   --   --   --   --   PHOS 2.7  --   --   --  3.1  --   --   --   --   --   --   --   < > = values in this  interval not displayed.  Liver Function Tests:  Recent Labs Lab 09/18/14 0342 09/19/14 0500 09/20/14 0400 09/21/14 0420 09/22/14 0230  AST 81* 96* 63* 60* 75*  ALT 20 24 24 24  35  ALKPHOS 66 80 83 99 181*  BILITOT 7.4* 12.1* 13.0*  13.3* 13.9* 16.2*  PROT 3.5* 4.1* 4.2* 4.9* 4.9*  ALBUMIN 1.5* 1.9* 2.1* 2.4* 2.1*   No results for input(s): LIPASE, AMYLASE in the last 168 hours. No results for input(s): AMMONIA in the last 168 hours.  CBC:  Recent Labs Lab 09/19/14 0500  09/20/14 0400 09/20/14 1530 09/20/14 1600 09/21/14 0420 09/21/14 1800 09/22/14 0230  WBC 14.4*  --  9.9 10.0  --  9.5  --  12.6*  NEUTROABS 11.6*  --  7.9* 8.4*  --  8.0*  --  11.0*  HGB 10.8*  < > 9.1* 10.1* 10.2* 9.4* 10.5* 11.1*  HCT 30.5*  < > 25.9* 28.1* 30.0* 26.5* 31.0* 30.9*  MCV 85.9  --  84.6 83.9  --  84.7  --  85.4  PLT 54*  --  63* 89*  --  90*  --  111*  < > = values in this interval not displayed.  Cardiac Enzymes: No results for input(s): CKTOTAL, CKMB, CKMBINDEX, TROPONINI in the last 168 hours.  BNP: BNP (last 3 results)  Recent Labs  09/13/14 1310  BNP 96.8    ProBNP (last 3 results) No results for input(s): PROBNP in the last 8760 hours.    Other results:  Imaging: Dg Chest Port 1 View  09/22/2014   CLINICAL DATA:  CHF  EXAM: PORTABLE CHEST - 1  VIEW  COMPARISON:  Yesterday  FINDINGS: Right PICC placed. Tip is at the cavoatrial junction. Tubular devices otherwise stable. Bilateral central and basilar opacity stable, likely a combination of airspace disease and pleural fluid. Normal heart size. No pneumothorax.  IMPRESSION: Right PICC placed to the cavoatrial junction.  Stable bibasilar opacity likely a combination of airspace disease and fluid.   Electronically Signed   By: Jolaine Click M.D.   On: 09/22/2014 07:56   Dg Chest Port 1 View  09/21/2014   CLINICAL DATA:  Respiratory failure  EXAM: PORTABLE CHEST - 1 VIEW  COMPARISON:  09/20/2014  FINDINGS: Cardiac shadow is stable. Postoperative changes are again seen. An endotracheal tube is again noted 6.3 cm above the carina. A nasogastric catheter is seen within the stomach. Swan-Ganz catheter lies within the left pulmonary artery. Bilateral thoracostomy catheters are seen. No pneumothorax is noted. Bilateral posteriorly layering effusions are seen and increased from the prior exam. Mild changes of vascular congestion are seen.  IMPRESSION: Tubes and lines as described.  Increasing bilateral pleural effusions.  Mild vascular congestion.   Electronically Signed   By: Alcide Clever M.D.   On: 09/21/2014 07:27   US Abdomen Limited Ruq  09/22/2014   CLINICAL DATA:  74 year old male with jaundice. Initial encounter.  EXAM: US ABDOMEN LIMITED - RIGHT UPPER QUADRANT  COMPARISON:  Chest radiographs 09/21/2014 and earlier  FINDINGS: Gallbladder:  Positive for shadowing gallstones, individually measuring up to 7 mm. The gallbladder wall appears mildly thickened up to 3-4 mm. The patient is sedated, sonographic Eulah Pont sign could not be elicited. No pericholecystic fluid identified.  Common bile duct:  Diameter: 3 mm, normal  Liver:  No focal lesion identified. Within normal limits in parenchymal echogenicity. No intrahepatic biliary ductal dilatation. No discrete liver lesion identified.  Other findings: Negative  visible right kidney  IMPRESSION: 1. Gallstones and up to mild gallbladder wall thickening. Consider Acute Cholecystitis. If the clinical course is equivocal nuclear medicine hepatobiliary scan may be valuable to characterize cystic duct patency. 2. No intra or extra biliary ductal enlargement identified to suggest acute biliary obstruction.   Electronically Signed   By: Odessa FlemingH  Hall M.D.   On: 09/22/2014 07:52     Medications:     Scheduled Medications: . sodium chloride   Intravenous Once  . albumin human  12.5 g Intravenous Q6H  . amiodarone  150 mg Intravenous Once  . antiseptic oral rinse  7 mL Mouth Rinse QID  . aspirin EC  325 mg Oral Daily   Or  . aspirin  324 mg Per Tube Daily  . atorvastatin  40 mg Oral q1800  . bisacodyl  10 mg Oral Daily   Or  . bisacodyl  10 mg Rectal Daily  . budesonide (PULMICORT) nebulizer solution  0.5 mg Nebulization BID  . cefTAZidime (FORTAZ)  IV  1 g Intravenous Q8H  . chlorhexidine  15 mL Mouth Rinse BID  . [START ON 09/23/2014] digoxin  0.125 mg Intravenous QODAY  . docusate sodium  200 mg Oral Daily  . enoxaparin (LOVENOX) injection  40 mg Subcutaneous Daily  . feeding supplement (VITAL 1.5 CAL)  1,000 mL Per Tube Q24H  . insulin aspart  0-9 Units Subcutaneous 6 times per day  . insulin detemir  15 Units Subcutaneous BID  . pantoprazole (PROTONIX) IV  40 mg Intravenous Q24H  . sodium chloride  10-40 mL Intracatheter Q12H  . sodium chloride  3 mL Intravenous Q12H  . vancomycin  1,250 mg Intravenous Q24H    Infusions: . sodium chloride 10 mL/hr at 09/22/14 0600  . sodium chloride 250 mL (09/22/14 0600)  . amiodarone 30 mg/hr (09/22/14 0827)  . dexmedetomidine 1 mcg/kg/hr (09/22/14 0810)  . DOPamine 5 mcg/kg/min (09/22/14 0800)  . EPINEPHrine 4 mg in dextrose 5% 250 mL infusion (16 mcg/mL) 9.5 mcg/min (09/22/14 0800)  . furosemide (LASIX) infusion 4 mg/hr (09/22/14 0800)  . lactated ringers Stopped (09/18/14 1204)  . lactated ringers 10 mL/hr  at 09/22/14 0600  . lidocaine Stopped (09/15/14 0800)  . midazolam (VERSED) infusion 4 mg/hr (09/22/14 0800)  . milrinone 0.25 mcg/kg/min (09/22/14 0809)  . norepinephrine (LEVOPHED) Adult infusion 40 mcg/min (09/22/14 0809)    PRN Medications: sodium chloride, albumin human, ipratropium-albuterol, levalbuterol, metoprolol, midazolam, morphine injection, ondansetron (ZOFRAN) IV, oxyCODONE, sodium chloride, sodium chloride, traMADol   Assessment:   1. Cardiogenic shock    -Impella CP in place 2. Acute systolic HF EF 25-30% by TEE 4/15 3. Acute anterolateral STEMI    --Emergent CABG 4/13 4. Acute respiratory failure 5. Thrombocytopenia 6. Acute renal failure, stage III 7. Hyponatremia 8. PAF with RVR 9. Hyperbilirubinemia  Plan/Discussion:    He is deteriorating with multi-system organ failure. Still requiring high-dose pressors. Swan numbers marginal but intervascularly volume depleted despite extensive 3rd spacing.   Will stop lasix. Continue to try and wean pressors as tolerated. Start low-dose midodrine.   Hyberbilirubinemia is at least half direct bili arguing for cholestatic picture. Will await RUQ u/s. Can discuss with GI as needed. Also appears to have ileus. Hold TFs. Check KUB.  Options increasingly limited.  The patient is critically ill with multiple organ systems failure and requires high complexity decision making for assessment and support, frequent evaluation and titration of therapies, application of advanced monitoring technologies and extensive interpretation of multiple databases.   Critical  Care Time devoted to patient care services described in this note is 35 Minutes.   Length of Stay: 9 Arvilla Meres MD  09/22/2014, 9:04 AM  Advanced Heart Failure Team Pager [phone] (M-F; 7a - 4p)  Please contact [facility] for night-coverage after hours (4p -7a ) and weekends on [email or web address]

## 2014-09-23 ENCOUNTER — Inpatient Hospital Stay (HOSPITAL_COMMUNITY): Payer: Medicare Other

## 2014-09-23 DIAGNOSIS — K831 Obstruction of bile duct: Secondary | ICD-10-CM | POA: Insufficient documentation

## 2014-09-23 DIAGNOSIS — K8020 Calculus of gallbladder without cholecystitis without obstruction: Secondary | ICD-10-CM | POA: Insufficient documentation

## 2014-09-23 DIAGNOSIS — R945 Abnormal results of liver function studies: Secondary | ICD-10-CM

## 2014-09-23 DIAGNOSIS — R7989 Other specified abnormal findings of blood chemistry: Secondary | ICD-10-CM | POA: Insufficient documentation

## 2014-09-23 LAB — CBC WITH DIFFERENTIAL/PLATELET
Basophils Absolute: 0 10*3/uL (ref 0.0–0.1)
Basophils Relative: 0 % (ref 0–1)
Eosinophils Absolute: 0 10*3/uL (ref 0.0–0.7)
Eosinophils Relative: 0 % (ref 0–5)
HCT: 29.3 % — ABNORMAL LOW (ref 39.0–52.0)
Hemoglobin: 10.4 g/dL — ABNORMAL LOW (ref 13.0–17.0)
Lymphocytes Relative: 3 % — ABNORMAL LOW (ref 12–46)
Lymphs Abs: 0.4 10*3/uL — ABNORMAL LOW (ref 0.7–4.0)
MCH: 29.5 pg (ref 26.0–34.0)
MCHC: 35.5 g/dL (ref 30.0–36.0)
MCV: 83 fL (ref 78.0–100.0)
Monocytes Absolute: 0.7 10*3/uL (ref 0.1–1.0)
Monocytes Relative: 6 % (ref 3–12)
Neutro Abs: 11.1 10*3/uL — ABNORMAL HIGH (ref 1.7–7.7)
Neutrophils Relative %: 91 % — ABNORMAL HIGH (ref 43–77)
Platelets: 133 10*3/uL — ABNORMAL LOW (ref 150–400)
RBC: 3.53 MIL/uL — ABNORMAL LOW (ref 4.22–5.81)
RDW: 17.1 % — ABNORMAL HIGH (ref 11.5–15.5)
WBC: 12.2 10*3/uL — ABNORMAL HIGH (ref 4.0–10.5)

## 2014-09-23 LAB — URINALYSIS, ROUTINE W REFLEX MICROSCOPIC
Glucose, UA: NEGATIVE mg/dL
Ketones, ur: NEGATIVE mg/dL
LEUKOCYTES UA: NEGATIVE
Nitrite: NEGATIVE
PH: 5 (ref 5.0–8.0)
Protein, ur: 30 mg/dL — AB
Specific Gravity, Urine: 1.012 (ref 1.005–1.030)
Urobilinogen, UA: 0.2 mg/dL (ref 0.0–1.0)

## 2014-09-23 LAB — COMPREHENSIVE METABOLIC PANEL
ALT: 39 U/L (ref 0–53)
AST: 94 U/L — ABNORMAL HIGH (ref 0–37)
Albumin: 2.5 g/dL — ABNORMAL LOW (ref 3.5–5.2)
Alkaline Phosphatase: 161 U/L — ABNORMAL HIGH (ref 39–117)
Anion gap: 10 (ref 5–15)
BUN: 21 mg/dL (ref 6–23)
CO2: 26 mmol/L (ref 19–32)
Calcium: 7.7 mg/dL — ABNORMAL LOW (ref 8.4–10.5)
Chloride: 96 mmol/L (ref 96–112)
Creatinine, Ser: 1.63 mg/dL — ABNORMAL HIGH (ref 0.50–1.35)
GFR calc Af Amer: 47 mL/min — ABNORMAL LOW (ref >90)
GFR calc non Af Amer: 41 mL/min — ABNORMAL LOW (ref >90)
Glucose, Bld: 77 mg/dL (ref 70–99)
Potassium: 3.3 mmol/L — ABNORMAL LOW (ref 3.5–5.1)
Sodium: 132 mmol/L — ABNORMAL LOW (ref 135–145)
Total Bilirubin: 14.9 mg/dL — ABNORMAL HIGH (ref 0.3–1.2)
Total Protein: 4.8 g/dL — ABNORMAL LOW (ref 6.0–8.3)

## 2014-09-23 LAB — BLOOD GAS, ARTERIAL
Acid-Base Excess: 4.3 mmol/L — ABNORMAL HIGH (ref 0.0–2.0)
Bicarbonate: 27.9 mEq/L — ABNORMAL HIGH (ref 20.0–24.0)
FIO2: 0.4 %
MECHVT: 600 mL
O2 Saturation: 95.8 %
PEEP: 5 cmH2O
Patient temperature: 98.6
RATE: 16 resp/min
TCO2: 29.1 mmol/L (ref 0–100)
pCO2 arterial: 39.1 mmHg (ref 35.0–45.0)
pH, Arterial: 7.467 — ABNORMAL HIGH (ref 7.350–7.450)
pO2, Arterial: 77 mmHg — ABNORMAL LOW (ref 80.0–100.0)

## 2014-09-23 LAB — GLUCOSE, CAPILLARY
GLUCOSE-CAPILLARY: 145 mg/dL — AB (ref 70–99)
GLUCOSE-CAPILLARY: 97 mg/dL (ref 70–99)
GLUCOSE-CAPILLARY: 99 mg/dL (ref 70–99)
Glucose-Capillary: 71 mg/dL (ref 70–99)
Glucose-Capillary: 43 mg/dL — CL (ref 70–99)
Glucose-Capillary: 63 mg/dL — ABNORMAL LOW (ref 70–99)
Glucose-Capillary: 98 mg/dL (ref 70–99)
Glucose-Capillary: 102 mg/dL — ABNORMAL HIGH (ref 70–99)
Glucose-Capillary: 118 mg/dL — ABNORMAL HIGH (ref 70–99)

## 2014-09-23 LAB — CULTURE, RESPIRATORY W GRAM STAIN: Special Requests: NORMAL

## 2014-09-23 LAB — URINE MICROSCOPIC-ADD ON

## 2014-09-23 LAB — CARBOXYHEMOGLOBIN
CARBOXYHEMOGLOBIN: 1.7 % — AB (ref 0.5–1.5)
Methemoglobin: 0.8 % (ref 0.0–1.5)
O2 SAT: 63.2 %
Total hemoglobin: 13.8 g/dL (ref 13.5–18.0)

## 2014-09-23 MED ORDER — MIDODRINE HCL 5 MG PO TABS
5.0000 mg | ORAL_TABLET | Freq: Three times a day (TID) | ORAL | Status: DC
  Administered 2014-09-23 – 2014-09-25 (×6): 5 mg via ORAL
  Filled 2014-09-23 (×9): qty 1

## 2014-09-23 MED ORDER — POTASSIUM CHLORIDE 10 MEQ/50ML IV SOLN
10.0000 meq | INTRAVENOUS | Status: AC | PRN
  Administered 2014-09-23 (×3): 10 meq via INTRAVENOUS
  Filled 2014-09-23 (×3): qty 50

## 2014-09-23 MED ORDER — DEXTROSE 50 % IV SOLN
INTRAVENOUS | Status: AC
  Administered 2014-09-23: 50 mL
  Filled 2014-09-23: qty 50

## 2014-09-23 NOTE — Progress Notes (Addendum)
Advanced Heart Failure Rounding Note   Subjective:    Remains intubated. Awake  Impella removed 4/20.   Lasix stopped yesterday. Due to low PCWP and rising creatinine. Midodrine started. Now able to wean levophed (24) and EPI (4.5). Co-ox 63%  Continue to autodiurese. Maintaining SR on amio    Cr 1.7 -> 1.6. Bili 16->14. RUQ u/s with gallstones but ab nontender. Appreciate GI input. KUB non-obstructive. + stool.   CVP 6 PA 29/17 (22)  PCWP 16 SVR 1152 Thermo 4.0/2.4 Co-ox 63%   Objective:   Weight Range:  Vital Signs:   Temp:  [97.3 F (36.3 C)-99.3 F (37.4 C)] 97.3 F (36.3 C) (04/23 1000) Pulse Rate:  [92-134] 92 (04/23 1000) Resp:  [0-28] 13 (04/23 1000) BP: (92-124)/(50-56) 106/52 mmHg (04/23 0353) SpO2:  [94 %-99 %] 98 % (04/23 1000) FiO2 (%):  [40 %] 40 % (04/23 1000) Weight:  [73.2 kg (161 lb 6 oz)] 73.2 kg (161 lb 6 oz) (04/23 0500)    Weight change: Filed Weights   09/21/14 0500 09/22/14 0400 09/23/14 0500  Weight: 75.3 kg (166 lb 0.1 oz) 74.6 kg (164 lb 7.4 oz) 73.2 kg (161 lb 6 oz)    Intake/Output:   Intake/Output Summary (Last 24 hours) at 09/23/14 1014 Last data filed at 09/23/14 1000  Gross per 24 hour  Intake 4141.39 ml  Output   4870 ml  Net -728.61 ml     Physical Exam: General:  Intubated sedated. Jaundiced HEENT: ETT. Scleral icterus.  Neck: supple.  RIJ swan Carotids 2+ bilat; no bruits. No lymphadenopathy or thryomegaly appreciated. Cor: Sternal dressing. +CTs PMI nondisplaced. Irregular rate & rhythm. Lungs: clear Abdomen: soft, nontender, + distended. No hepatosplenomegaly. No bruits or masses. Hypoactive bowel sounds. Extremities: no cyanosis, clubbing, rash, 1-2+ edema Neuro: alert & orientedx3, cranial nerves grossly intact. moves all 4 extremities w/o difficulty. Affect pleasant  Telemetry: SR 90s  Labs: Basic Metabolic Panel:  Recent Labs Lab 09/19/14 0500  09/20/14 0400 09/20/14 1600 09/21/14 0420  09/21/14 1800 09/22/14 0230 09/22/14 1550 09/23/14 0400  NA 132*  < > 131* 130* 128* 131* 131* 132* 132*  K 3.8  < > 3.4* 3.7 3.5 3.8 3.5 3.9 3.3*  CL 103  < > 98 91* 92*  --  94* 93* 96  CO2 23  --  26  --  26  --  27  --  26  GLUCOSE 127*  < > 134* 153* 176* 158* 111* 96 77  BUN 11  < > --  19 28* 21  CREATININE 1.03  < > 1.19 1.40* 1.52*  --  1.68* 1.80* 1.63*  CALCIUM 7.3*  --  7.0*  --  7.0*  --  7.0*  --  7.7*  MG 1.7  --   --   --   --   --   --   --   --   PHOS 3.1  --   --   --   --   --   --   --   --   < > = values in this interval not displayed.  Liver Function Tests:  Recent Labs Lab 09/19/14 0500 09/20/14 0400 09/21/14 0420 09/22/14 0230 09/22/14 1145 09/23/14 0400  AST 96* 63* 60* 75*  --  94*  ALT 35  --  39  ALKPHOS 80 83 99 181*  --  161*  BILITOT 12.1* 13.0*  13.3* 13.9* 16.2* 15.8*  14.9*  PROT 4.1* 4.2* 4.9* 4.9*  --  4.8*  ALBUMIN 1.9* 2.1* 2.4* 2.1*  --  2.5*   No results for input(s): LIPASE, AMYLASE in the last 168 hours. No results for input(s): AMMONIA in the last 168 hours.  CBC:  Recent Labs Lab 09/20/14 0400 09/20/14 1530  09/21/14 0420 09/21/14 1800 09/22/14 0230 09/22/14 1550 09/23/14 0400  WBC 9.9 10.0  --  9.5  --  12.6*  --  12.2*  NEUTROABS 7.9* 8.4*  --  8.0*  --  11.0*  --  11.1*  HGB 9.1* 10.1*  < > 9.4* 10.5* 11.1* 11.9* 10.4*  HCT 25.9* 28.1*  < > 26.5* 31.0* 30.9* 35.0* 29.3*  MCV 84.6 83.9  --  84.7  --  85.4  --  83.0  PLT 63* 89*  --  90*  --  111*  --  133*  < > = values in this interval not displayed.  Cardiac Enzymes: No results for input(s): CKTOTAL, CKMB, CKMBINDEX, TROPONINI in the last 168 hours.  BNP: BNP (last 3 results)  Recent Labs  09/13/14 1310  BNP 96.8    ProBNP (last 3 results) No results for input(s): PROBNP in the last 8760 hours.    Other results:  Imaging: Dg Chest Port 1 View  09/23/2014   CLINICAL DATA:  Three vessel CABG 09/13/2014. Ventilator dependent  respiratory failure. Followup edema and effusions.  EXAM: PORTABLE CHEST - 1 VIEW  COMPARISON:  Portable chest x-rays yesterday and earlier.  FINDINGS: Endotracheal tube tip in satisfactory position projecting approximately 5 cm above the carina. Bilateral chest tubes in place with no pneumothorax. Right jugular Swan-Ganz catheter tip projects over the left main pulmonary artery. Nasogastric tube courses below the diaphragm into the stomach.  Resolution of interstitial pulmonary edema since yesterday. Persistent bilateral pleural effusions, unchanged to slightly improved. Persistent atelectasis in the lung bases, unchanged. No new pulmonary parenchymal abnormalities.  IMPRESSION: 1. Support apparatus satisfactory. 2. Resolution of interstitial pulmonary edema since yesterday. 3. Stable to slight improvement in bilateral pleural effusions. 4. Stable bibasilar atelectasis. 5. No new abnormalities.   Electronically Signed   By: Hulan Saas M.D.   On: 09/23/2014 09:41   Dg Chest Port 1 View  09/22/2014   CLINICAL DATA:  CHF  EXAM: PORTABLE CHEST - 1 VIEW  COMPARISON:  Yesterday  FINDINGS: Right PICC placed. Tip is at the cavoatrial junction. Tubular devices otherwise stable. Bilateral central and basilar opacity stable, likely a combination of airspace disease and pleural fluid. Normal heart size. No pneumothorax.  IMPRESSION: Right PICC placed to the cavoatrial junction.  Stable bibasilar opacity likely a combination of airspace disease and fluid.   Electronically Signed   By: Jolaine Click M.D.   On: 09/22/2014 07:56   Dg Abd Portable 1v  09/22/2014   CLINICAL DATA:  Ileus.  EXAM: PORTABLE ABDOMEN - 1 VIEW  COMPARISON:  09/17/2014  FINDINGS: Nasogastric tube has tip and side-port over the stomach in the left upper quadrant to midline abdomen. Double feeding tube is present with tip just right of midline likely over the distal stomach. The ends of patient's chest tubes are projected over the mid abdomen.  Small caliber catheter projected over the rectum.  Bowel gas pattern is nonobstructive. Remainder of the exam is unchanged.  IMPRESSION: Nonobstructive bowel gas pattern.  Tubes and lines as described.   Electronically Signed   By: Elberta Fortis M.D.   On: 09/22/2014 09:42   US Abdomen  Limited Ruq  09/22/2014   CLINICAL DATA:  74 year old male with jaundice. Initial encounter.  EXAM: US ABDOMEN LIMITED - RIGHT UPPER QUADRANT  COMPARISON:  Chest radiographs 09/21/2014 and earlier  FINDINGS: Gallbladder:  Positive for shadowing gallstones, individually measuring up to 7 mm. The gallbladder wall appears mildly thickened up to 3-4 mm. The patient is sedated, sonographic Eulah PontMurphy sign could not be elicited. No pericholecystic fluid identified.  Common bile duct:  Diameter: 3 mm, normal  Liver:  No focal lesion identified. Within normal limits in parenchymal echogenicity. No intrahepatic biliary ductal dilatation. No discrete liver lesion identified.  Other findings: Negative visible right kidney  IMPRESSION: 1. Gallstones and up to mild gallbladder wall thickening. Consider Acute Cholecystitis. If the clinical course is equivocal nuclear medicine hepatobiliary scan may be valuable to characterize cystic duct patency. 2. No intra or extra biliary ductal enlargement identified to suggest acute biliary obstruction.   Electronically Signed   By: Odessa FlemingH  Hall M.D.   On: 09/22/2014 07:52     Medications:     Scheduled Medications: . sodium chloride   Intravenous Once  . amiodarone  150 mg Intravenous Once  . antiseptic oral rinse  7 mL Mouth Rinse QID  . aspirin EC  325 mg Oral Daily   Or  . aspirin  324 mg Per Tube Daily  . atorvastatin  40 mg Oral q1800  . bisacodyl  10 mg Oral Daily   Or  . bisacodyl  10 mg Rectal Daily  . budesonide (PULMICORT) nebulizer solution  0.5 mg Nebulization BID  . cefTAZidime (FORTAZ)  IV  1 g Intravenous Q8H  . chlorhexidine  15 mL Mouth Rinse BID  . digoxin  0.125 mg  Intravenous QODAY  . docusate  200 mg Per Tube Daily  . enoxaparin (LOVENOX) injection  40 mg Subcutaneous Daily  . feeding supplement (VITAL 1.5 CAL)  1,000 mL Per Tube Q24H  . insulin aspart  0-9 Units Subcutaneous 6 times per day  . insulin detemir  15 Units Subcutaneous BID  . midodrine  2.5 mg Oral TID WC  . pantoprazole (PROTONIX) IV  40 mg Intravenous Q24H  . sodium chloride  10-40 mL Intracatheter Q12H  . sodium chloride  3 mL Intravenous Q12H  . vancomycin  1,000 mg Intravenous Q24H    Infusions: . sodium chloride 10 mL/hr at 09/22/14 0600  . sodium chloride 250 mL (09/22/14 0600)  . amiodarone 30 mg/hr (09/23/14 0800)  . dexmedetomidine 1 mcg/kg/hr (09/23/14 0800)  . DOPamine 5 mcg/kg/min (09/23/14 0800)  . EPINEPHrine 4 mg in dextrose 5% 250 mL infusion (16 mcg/mL) 5 mcg/min (09/23/14 0800)  . lactated ringers Stopped (09/18/14 1204)  . lactated ringers 10 mL/hr at 09/22/14 2257  . lidocaine Stopped (09/15/14 0800)  . midazolam (VERSED) infusion 6 mg/hr (09/23/14 0800)  . milrinone 0.25 mcg/kg/min (09/23/14 0800)  . norepinephrine (LEVOPHED) Adult infusion 26 mcg/min (09/23/14 0900)    PRN Medications: sodium chloride, albumin human, ipratropium-albuterol, levalbuterol, metoprolol, midazolam, morphine injection, ondansetron (ZOFRAN) IV, oxyCODONE, sodium chloride, sodium chloride, traMADol   Assessment:   1. Cardiogenic shock 2. Acute systolic HF EF 25-30% by TEE 4/15 3. Acute anterolateral STEMI    --Emergent CABG 4/13 4. Acute respiratory failure 5. Thrombocytopenia 6. Acute renal failure, stage III 7. Hyponatremia/hyponkalemia 8. PAF with RVR 9. Hyperbilirubinemia  Plan/Discussion:    Improved today. Pressor requirement down significantly. Swan numbers look good. Will increase midodrine to 5 bid. He is autodiuresing - will continue to  hold lasix. Continue IV amio.   Hyberbilirubinemia improving. Suspect combination of hemolysis and cholestatic picture.  Hopefully will continue to improve as he improves.   WBC count rising a little bit with left shift. Will check UA. Continue vancomycin and ceftaz.   K being replaced.   The patient is critically ill with multiple organ systems failure and requires high complexity decision making for assessment and support, frequent evaluation and titration of therapies, application of advanced monitoring technologies and extensive interpretation of multiple databases.   Critical Care Time devoted to patient care services described in this note is 35 Minutes.   Length of Stay: 10 Arvilla Meres MD  09/23/2014, 10:14 AM  Advanced Heart Failure Team Pager [phone] (M-F; 7a - 4p)  Please contact [facility] for night-coverage after hours (4p -7a ) and weekends on [email or web address]

## 2014-09-23 NOTE — Progress Notes (Signed)
10 Days Post-Op Procedure(s) (LRB): CORONARY ARTERY BYPASS GRAFTING (CABG), ON PUMP, TIMES THREE, USING LEFT INTERNAL MAMMARY ARTERY, LEFT GREATER SAPHENOUS VEIN HARVESTED ENDOSCOPICALLY (N/A) PATENT FORAMEN OVALE CLOSURE Subjective: Maintaining adequate hemodynamics with co-OX greater than 60% after removal of temporary LVAD and weaning epinephrine  Maintaining sinus rhythm on amiodarone Hyperbilirubinemia from multifactorial origin, probably not cholecystitis  Chest x-ray remains wet with bilateral pleural drains, Lasix stopped yesterday because of rising creatinine, urine output however remains adequate with improved creatinine today  Objective: Vital signs in last 24 hours: Temp:  [97.3 F (36.3 C)-99.3 F (37.4 C)] 97.3 F (36.3 C) (04/23 1149) Pulse Rate:  [88-134] 92 (04/23 1149) Cardiac Rhythm:  [-] Normal sinus rhythm (04/23 1000) Resp:  [0-29] 29 (04/23 1149) BP: (84-124)/(46-56) 84/46 mmHg (04/23 1149) SpO2:  [94 %-98 %] 97 % (04/23 1149) FiO2 (%):  [40 %] 40 % (04/23 1149) Weight:  [161 lb 6 oz (73.2 kg)] 161 lb 6 oz (73.2 kg) (04/23 0500)  Hemodynamic parameters for last 24 hours: PAP: (21-33)/(13-23) 29/18 mmHg CVP:  [3 mmHg-12 mmHg] 9 mmHg CO:  [3.6 L/min-4.6 L/min] 4 L/min CI:  [2.1 L/min/m2-2.7 L/min/m2] 2.4 L/min/m2  Intake/Output from previous day: 04/22 0701 - 04/23 0700 In: 4256.2 [I.V.:3376.2; NG/GT:180; IV Piggyback:700] Out: 5195 [Urine:4985; Emesis/NG output:200; Chest Tube:10] Intake/Output this shift: Total I/O In: 727.8 [I.V.:447.8; NG/GT:180; IV Piggyback:100] Out: 825 [Urine:775; Emesis/NG output:50]  Incisions healing well  Lab Results:  Recent Labs  09/22/14 0230 09/22/14 1550 09/23/14 0400  WBC 12.6*  --  12.2*  HGB 11.1* 11.9* 10.4*  HCT 30.9* 35.0* 29.3*  PLT 111*  --  133*   BMET:  Recent Labs  09/22/14 0230 09/22/14 1550 09/23/14 0400  NA 131* 132* 132*  K 3.5 3.9 3.3*  CL 94* 93* 96  CO2 27  --  26  GLUCOSE 111* 96  77  BUN 19 28* 21  CREATININE 1.68* 1.80* 1.63*  CALCIUM 7.0*  --  7.7*    PT/INR:  Recent Labs  09/21/14 0900  LABPROT 13.4  INR 1.01   ABG    Component Value Date/Time   PHART 7.467* 09/23/2014 0400   HCO3 27.9* 09/23/2014 0400   TCO2 29.1 09/23/2014 0400   ACIDBASEDEF 2.0 09/19/2014 1208   O2SAT 95.8 09/23/2014 0400   CBG (last 3)   Recent Labs  09/22/14 2352 09/23/14 0332 09/23/14 0728  GLUCAP 97 71 43*    Assessment/Plan: S/P Procedure(s) (LRB): CORONARY ARTERY BYPASS GRAFTING (CABG), ON PUMP, TIMES THREE, USING LEFT INTERNAL MAMMARY ARTERY, LEFT GREATER SAPHENOUS VEIN HARVESTED ENDOSCOPICALLY (N/A) PATENT FORAMEN OVALE CLOSURE Resume tube feeds for nutrition Continue antibiotics for probable ventilator dependent pneumonia Wean epinephrine and norepinephrine as tolerated We'll need to remove Swan catheter and invasive lines placed at time of surgery soon We'll attempt ventilator wean the next 48 hours but patient may need tracheostomy   LOS: 10 days    Kathlee Nationseter Van Trigt III 09/23/2014

## 2014-09-23 NOTE — Progress Notes (Signed)
Daily Rounding Note  09/23/2014, 8:49 AM  LOS: 10 days   SUBJECTIVE:       Tube feeds restarted to smoke morning. Remains on multiple pressors and drips. Remains intubated.  OBJECTIVE:         Vital signs in last 24 hours:    Temp:  [97.9 F (36.6 C)-99.3 F (37.4 C)] 97.9 F (36.6 C) (04/23 0845) Pulse Rate:  [93-134] 96 (04/23 0845) Resp:  [0-28] 21 (04/23 0845) BP: (92-124)/(50-56) 106/52 mmHg (04/23 0353) SpO2:  [94 %-100 %] 97 % (04/23 0845) FiO2 (%):  [40 %] 40 % (04/23 0803) Weight:  [161 lb 6 oz (73.2 kg)] 161 lb 6 oz (73.2 kg) (04/23 0500)   Filed Weights   09/21/14 0500 09/22/14 0400 09/23/14 0500  Weight: 166 lb 0.1 oz (75.3 kg) 164 lb 7.4 oz (74.6 kg) 161 lb 6 oz (73.2 kg)   General: Remains intubated and critically ill appearing. Multiple lines in place.   Heart: Currently in sinus rhythm in the 90s. Chest: rough BS bil.  Breathing quietly on vent/ETT Abdomen: slight protuberance.  Not tender.  GU: Scrotal edema. Extremities: Anasarca Neuro/Psych:  Did not make a vigorous attempts to awaken patient who is on Precedex.  No spontaneous limb movement.  Intake/Output from previous day: 04/22 0701 - 04/23 0700 In: 4256.2 [I.V.:3376.2; NG/GT:180; IV Piggyback:700] Out: 5195 [Urine:4985; Emesis/NG output:200; Chest Tube:10]  Intake/Output this shift: Total I/O In: 199.2 [I.V.:89.2; NG/GT:60; IV Piggyback:50] Out: 400 [Urine:350; Emesis/NG output:50]  Lab Results:  Recent Labs  09/21/14 0420  09/22/14 0230 09/22/14 1550 09/23/14 0400  WBC 9.5  --  12.6*  --  12.2*  HGB 9.4*  < > 11.1* 11.9* 10.4*  HCT 26.5*  < > 30.9* 35.0* 29.3*  PLT 90*  --  111*  --  133*  < > = values in this interval not displayed. BMET  Recent Labs  09/21/14 0420  09/22/14 0230 09/22/14 1550 09/23/14 0400  NA 128*  < > 131* 132* 132*  K 3.5  < > 3.5 3.9 3.3*  CL 92*  --  94* 93* 96  CO2 26  --  27  --  26    GLUCOSE 176*  < > 111* 96 77  BUN 16  --  19 28* 21  CREATININE 1.52*  --  1.68* 1.80* 1.63*  CALCIUM 7.0*  --  7.0*  --  7.7*  < > = values in this interval not displayed. LFT  Recent Labs  09/21/14 0420 09/22/14 0230 09/22/14 1145 09/23/14 0400  PROT 4.9* 4.9*  --  4.8*  ALBUMIN 2.4* 2.1*  --  2.5*  AST 60* 75*  --  94*  ALT 24 35  --  39  ALKPHOS 99 181*  --  161*  BILITOT 13.9* 16.2* 15.8* 14.9*  BILIDIR  --   --  9.9*  --   IBILI  --   --  5.9*  --    PT/INR  Recent Labs  09/21/14 0900  LABPROT 13.4  INR 1.01   Hepatitis Panel No results for input(s): HEPBSAG, HCVAB, HEPAIGM, HEPBIGM in the last 72 hours.  Studies/Results: Dg Chest Port 1 View  09/22/2014   CLINICAL DATA:  CHF  EXAM: PORTABLE CHEST - 1 VIEW  COMPARISON:  Yesterday  FINDINGS: Right PICC placed. Tip is at the cavoatrial junction. Tubular devices otherwise stable. Bilateral central and basilar opacity stable, likely a combination of airspace  disease and pleural fluid. Normal heart size. No pneumothorax.  IMPRESSION: Right PICC placed to the cavoatrial junction.  Stable bibasilar opacity likely a combination of airspace disease and fluid.   Electronically Signed   By: Marybelle Killings M.D.   On: 09/22/2014 07:56   Dg Abd Portable 1v  09/22/2014   CLINICAL DATA:  Ileus.  EXAM: PORTABLE ABDOMEN - 1 VIEW  COMPARISON:  09/17/2014  FINDINGS: Nasogastric tube has tip and side-port over the stomach in the left upper quadrant to midline abdomen. Double feeding tube is present with tip just right of midline likely over the distal stomach. The ends of patient's chest tubes are projected over the mid abdomen. Small caliber catheter projected over the rectum.  Bowel gas pattern is nonobstructive. Remainder of the exam is unchanged.  IMPRESSION: Nonobstructive bowel gas pattern.  Tubes and lines as described.   Electronically Signed   By: Marin Olp M.D.   On: 09/22/2014 09:42   US Abdomen Limited Ruq  09/22/2014    CLINICAL DATA:  74 year old male with jaundice. Initial encounter.  EXAM: US ABDOMEN LIMITED - RIGHT UPPER QUADRANT  COMPARISON:  Chest radiographs 09/21/2014 and earlier  FINDINGS: Gallbladder:  Positive for shadowing gallstones, individually measuring up to 7 mm. The gallbladder wall appears mildly thickened up to 3-4 mm. The patient is sedated, sonographic Percell Miller sign could not be elicited. No pericholecystic fluid identified.  Common bile duct:  Diameter: 3 mm, normal  Liver:  No focal lesion identified. Within normal limits in parenchymal echogenicity. No intrahepatic biliary ductal dilatation. No discrete liver lesion identified.  Other findings: Negative visible right kidney  IMPRESSION: 1. Gallstones and up to mild gallbladder wall thickening. Consider Acute Cholecystitis. If the clinical course is equivocal nuclear medicine hepatobiliary scan may be valuable to characterize cystic duct patency. 2. No intra or extra biliary ductal enlargement identified to suggest acute biliary obstruction.   Electronically Signed   By: Genevie Ann M.D.   On: 09/22/2014 07:52    ASSESMENT:   * Jaundice, cholestasis. Majority of bili is direct/conjugated.  ? Cholecystitis. ? Hemolysis with the elevated LDH.  Currently normal PT/INR though were acutely elevated within several hours of his admission. No parenchymal liver disease but + gallstones and GB wall thickening on ultrasound.  T bili, alk phos improved.  AST worse. LDH improved.   * Acute STEMI, s/p emergent CABG/PFO repair/Impela placement (4/13-4/20), post op cardiogenic shock, arrythmia/tachycardia requiring coardiversion. Still intubated, on multiple pressors.   * Afib. On Lovenox.   * ABL anemia. S/p 15 PRBCs thus far.  * Coagulopathy, thrombocytopenia. S/p transfusion with multiples of FFP. Platelets.     PLAN   *  Supportive care.  Trend LFTs.  Depending on goals of care discussion, if enzymes remain elevated, then percutaneous  cholecystectomy with IR could be considered (as cholecystitis is the only "intervenable" etiology at present)    Azucena Freed  09/23/2014, 8:49 AM Pager: [phone]

## 2014-09-23 NOTE — Progress Notes (Addendum)
Hypoglycemic Event  CBG: 69  Treatment: D50 IV 25 mL  Symptoms: None  Follow-up CBG: Time:0800 CBG Result:96  Possible Reasons for Event: Inadequate meal intake  Comments/MD notified:Bensihmon    Carey BullocksJarrell, Jorge Nyshley Denise  Remember to initiate Hypoglycemia Order Set & complete

## 2014-09-23 NOTE — Progress Notes (Signed)
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  PCCM consulted post-op for assistance with shock and vent management.  STUDIES:  4/13 cardiac cath 4/13 Echo >> EF 35%  SIGNIFICANT EVENTS: 4/13 CABG, closure of PFO, impella, VDRF, cardiogenic shock  4/14 Lost pacemaker capture >> Cardiac arrest >> 8 minutes before ROSC >> wide complex tachycardia >> cardioversion; Hb 5 >> transfused PRBC 4/18 A fib with RVR >> amiodarone added 4/19 Oxygen desaturation >> suctioned mucus plug; a fib with RVR >> cardioversion 4/20 Impella out; A fib with RVR >> cardioversion  SUBJECTIVE: RASS 0. + F/C. Remains on multiple vasopressors.  VITAL SIGNS: Temp:  [97.3 F (36.3 C)-99.3 F (37.4 C)] 97.3 F (36.3 C) (04/23 1415) Pulse Rate:  [88-134] 92 (04/23 1415) Resp:  [0-37] 14 (04/23 1415) BP: (84-124)/(46-56) 104/51 mmHg (04/23 1330) SpO2:  [94 %-99 %] 98 % (04/23 1415) FiO2 (%):  [40 %] 40 % (04/23 1400) Weight:  [73.2 kg (161 lb 6 oz)] 73.2 kg (161 lb 6 oz) (04/23 0500) HEMODYNAMICS: PAP: (21-35)/(13-25) 35/25 mmHg CVP:  [3 mmHg-12 mmHg] 11 mmHg CO:  [4 L/min-4.6 L/min] 4 L/min CI:  [2.4 L/min/m2-2.7 L/min/m2] 2.4 L/min/m2 VENTILATOR SETTINGS: Vent Mode:  [-] PRVC FiO2 (%):  [40 %] 40 % Set Rate:  [16 bmp] 16 bmp Vt Set:  [600 mL] 600 mL PEEP:  [5 cmH20] 5 cmH20 Pressure Support:  [10 cmH20] 10 cmH20 Plateau Pressure:  [15 cmH20-22 cmH20] 18 cmH20 INTAKE / OUTPUT: Intake/Output      04/22 0701 - 04/23 0700 04/23 0701 - 04/24 0700   I.V. (mL/kg) 3376.2 (46.1) 781.3 (10.7)   Blood     NG/GT 180 270   IV Piggyback 700  100   Total Intake(mL/kg) 4256.2 (58.1) 1151.3 (15.7)   Urine (mL/kg/hr) 4985 (2.8) 1200 (2.2)   Emesis/NG output 200 (0.1) 50 (0.1)   Chest Tube 10 (0) 0 (0)   Total Output 5195 1250   Net -938.8 -98.7         PHYSICAL EXAMINATION: General: no distress, intubated Neuro: RASS 0, + F/C, MAEs HEENT: WNL Cardiovascular: regular, tachycardic Lungs: scattered faint crackles Abdomen: soft, non tender  Musculoskeletal: 1+ edema Skin: no rashes  LABS:  CBC  Recent Labs Lab 09/21/14 0420  09/22/14 0230 09/22/14 1550 09/23/14 0400  WBC 9.5  --  12.6*  --  12.2*  HGB 9.4*  < > 11.1* 11.9* 10.4*  HCT 26.5*  < > 30.9* 35.0* 29.3*  PLT 90*  --  111*  --  133*  < > = values in this interval not displayed.   Coag's  Recent Labs Lab 09/21/14 0900  INR 1.01    BMET  Recent Labs Lab 09/21/14 0420  09/22/14 0230 09/22/14 1550 09/23/14 0400  NA 128*  < > 131* 132* 132*  K 3.5  < > 3.5 3.9 3.3*  CL 92*  --  94* 93* 96  CO2 26  --  27  --  26  BUN 16  --  19 28* 21  CREATININE 1.52*  --  1.68* 1.80* 1.63*  GLUCOSE 176*  < >  111* 96 77  < > = values in this interval not displayed.   Electrolytes  Recent Labs Lab 09/19/14 0500  09/21/14 0420 09/22/14 0230 09/23/14 0400  CALCIUM 7.3*  < > 7.0* 7.0* 7.7*  MG 1.7  --   --   --   --   PHOS 3.1  --   --   --   --   < > = values in this interval not displayed.   Sepsis Markers No results for input(s): LATICACIDVEN, PROCALCITON, O2SATVEN in the last 168 hours. ABG  Recent Labs Lab 09/21/14 0417 09/22/14 0420 09/23/14 0400  PHART 7.407 7.446 7.467*  PCO2ART 41.6 37.4 39.1  PO2ART 60.0* 67.0* 77.0*   Liver Enzymes  Recent Labs Lab 09/21/14 0420 09/22/14 0230 09/22/14 1145 09/23/14 0400  AST 60* 75*  --  94*  ALT 24 35  --  39  ALKPHOS 99 181*  --  161*  BILITOT 13.9* 16.2* 15.8* 14.9*  ALBUMIN 2.4* 2.1*  --  2.5*   Cardiac Enzymes No results for input(s): TROPONINI, PROBNP in the last 168 hours.    Glucose  Recent Labs Lab 09/22/14 1545 09/22/14 1951 09/22/14 2352 09/23/14 0332 09/23/14 0728 09/23/14 1218  GLUCAP 86 83 97 71 43* 102*   Imaging Dg Chest Port 1 View  09/22/2014   CLINICAL DATA:  CHF  EXAM: PORTABLE CHEST - 1 VIEW  COMPARISON:  Yesterday  FINDINGS: Right PICC placed. Tip is at the cavoatrial junction. Tubular devices otherwise stable. Bilateral central and basilar opacity stable, likely a combination of airspace disease and pleural fluid. Normal heart size. No pneumothorax.  IMPRESSION: Right PICC placed to the cavoatrial junction.  Stable bibasilar opacity likely a combination of airspace disease and fluid.   Electronically Signed   By: Jolaine Click M.D.   On: 09/22/2014 07:56   Dg Abd Portable 1v  09/22/2014   CLINICAL DATA:  Ileus.  EXAM: PORTABLE ABDOMEN - 1 VIEW  COMPARISON:  09/17/2014  FINDINGS: Nasogastric tube has tip and side-port over the stomach in the left upper quadrant to midline abdomen. Double feeding tube is present with tip just right of midline likely over the distal stomach. The ends of patient's chest tubes are projected over the mid abdomen. Small caliber catheter projected over the rectum.  Bowel gas pattern is nonobstructive. Remainder of the exam is unchanged.  IMPRESSION: Nonobstructive bowel gas pattern.  Tubes and lines as described.   Electronically Signed   By: Elberta Fortis M.D.   On: 09/22/2014 09:42   US Abdomen Limited Ruq  09/22/2014   CLINICAL DATA:  74 year old male with jaundice. Initial encounter.  EXAM: US ABDOMEN LIMITED - RIGHT UPPER QUADRANT  COMPARISON:  Chest radiographs 09/21/2014 and earlier  FINDINGS: Gallbladder:  Positive for shadowing gallstones, individually measuring up to 7 mm. The gallbladder wall appears mildly thickened up to 3-4 mm. The patient is sedated, sonographic Eulah Pont sign could not be elicited. No pericholecystic fluid identified.  Common bile duct:  Diameter: 3 mm, normal  Liver:  No focal lesion  identified. Within normal limits in parenchymal echogenicity. No intrahepatic biliary ductal dilatation. No discrete liver lesion identified.  Other findings: Negative visible right kidney  IMPRESSION: 1. Gallstones and up to mild gallbladder wall thickening. Consider Acute Cholecystitis. If the clinical course is equivocal nuclear medicine hepatobiliary scan may be valuable to characterize cystic duct patency. 2. No intra or extra biliary ductal enlargement identified to suggest acute biliary obstruction.   Electronically Signed  By: Odessa FlemingH  Hall M.D.   On: 09/22/2014 07:52   ASSESSMENT / PLAN:  CARDIOVASCULAR A:  STEMI s/p CABG and PFO closure 4/13. Acute systolic CHF. Cardiogenic shock. A fib with RVR. Hx of HTN, HLD. P:  Mgmt per Cards/TCTS  PULMONARY ETT 4/13 >> A: Prolonged VDRF P:   Wean in PSV today SBT in AM If fails SBT, will need to consider trach tube placement  RENAL A:   Hyponatremia, mild Hypokalemia. AKI P:   Monitor BMET intermittently Monitor I/Os Correct electrolytes as indicated  GASTROINTESTINAL A:   Hx of GERD Hyperbilirubinemia P:   Cont SUP Cont TFs as tolerated GI service following for hyperbilirubinemia  HEMATOLOGIC A:   Mild anemia without acute blood loss Thrombocytopenia, improving P:  DVT px: LMWH Monitor CBC intermittently Transfuse per usual ICU guidelines  INFECTIOUS A:   Fever, unclear source P:   Abx per TCTS  ENDOCRINE A:   Hyperglycemia. P:   Cont SSI with levemir  NEUROLOGIC A:   Acute encephalopathy, resolved Hx of Anxiety / Depression. P:   RASS goal 0 Continue current Rx   D/w Dr. Freeman CaldronBensihmon  Talana Slatten, MD ; [facility] service Mobile [phone].  After 5:30 PM or weekends, call [phone]   09/23/2014, 2:34 PM

## 2014-09-23 NOTE — Progress Notes (Signed)
Hypoglycemic Event  CBG: 43  Treatment: D50 IV 25 mL  Symptoms: None  Follow-up CBG: Time:0745 CBG Result:69  Possible Reasons for Event: Inadequate meal intake  Comments/MD notified:Bensihmon    Carey BullocksJarrell, Jorge Nyshley Denise  Remember to initiate Hypoglycemia Order Set & complete

## 2014-09-24 LAB — CBC WITH DIFFERENTIAL/PLATELET
Basophils Absolute: 0 10*3/uL (ref 0.0–0.1)
Basophils Relative: 0 % (ref 0–1)
Eosinophils Absolute: 0.1 10*3/uL (ref 0.0–0.7)
Eosinophils Relative: 1 % (ref 0–5)
HCT: 30.7 % — ABNORMAL LOW (ref 39.0–52.0)
Hemoglobin: 10.9 g/dL — ABNORMAL LOW (ref 13.0–17.0)
Lymphocytes Relative: 1 % — ABNORMAL LOW (ref 12–46)
Lymphs Abs: 0.2 10*3/uL — ABNORMAL LOW (ref 0.7–4.0)
MCH: 30.3 pg (ref 26.0–34.0)
MCHC: 35.5 g/dL (ref 30.0–36.0)
MCV: 85.3 fL (ref 78.0–100.0)
Monocytes Absolute: 0.8 10*3/uL (ref 0.1–1.0)
Monocytes Relative: 6 % (ref 3–12)
Neutro Abs: 12 10*3/uL — ABNORMAL HIGH (ref 1.7–7.7)
Neutrophils Relative %: 92 % — ABNORMAL HIGH (ref 43–77)
Platelets: 146 10*3/uL — ABNORMAL LOW (ref 150–400)
RBC: 3.6 MIL/uL — ABNORMAL LOW (ref 4.22–5.81)
RDW: 17.6 % — ABNORMAL HIGH (ref 11.5–15.5)
WBC: 13.1 10*3/uL — ABNORMAL HIGH (ref 4.0–10.5)

## 2014-09-24 LAB — BLOOD GAS, ARTERIAL
Acid-Base Excess: 3.6 mmol/L — ABNORMAL HIGH (ref 0.0–2.0)
Bicarbonate: 27.2 mEq/L — ABNORMAL HIGH (ref 20.0–24.0)
Drawn by: 252031
FIO2: 0.4 %
MECHVT: 600 mL
O2 Saturation: 96.8 %
PEEP: 5 cmH2O
Patient temperature: 98.6
RATE: 16 resp/min
TCO2: 28.4 mmol/L (ref 0–100)
pCO2 arterial: 38.3 mmHg (ref 35.0–45.0)
pH, Arterial: 7.465 — ABNORMAL HIGH (ref 7.350–7.450)
pO2, Arterial: 81.2 mmHg (ref 80.0–100.0)

## 2014-09-24 LAB — GLUCOSE, CAPILLARY
GLUCOSE-CAPILLARY: 72 mg/dL (ref 70–99)
GLUCOSE-CAPILLARY: 129 mg/dL — AB (ref 70–99)
GLUCOSE-CAPILLARY: 143 mg/dL — AB (ref 70–99)
Glucose-Capillary: 107 mg/dL — ABNORMAL HIGH (ref 70–99)
Glucose-Capillary: 115 mg/dL — ABNORMAL HIGH (ref 70–99)
Glucose-Capillary: 89 mg/dL (ref 70–99)

## 2014-09-24 LAB — CARBOXYHEMOGLOBIN
CARBOXYHEMOGLOBIN: 1.7 % — AB (ref 0.5–1.5)
Methemoglobin: 1 % (ref 0.0–1.5)
O2 Saturation: 64.8 %
Total hemoglobin: 11.3 g/dL — ABNORMAL LOW (ref 13.5–18.0)

## 2014-09-24 LAB — POCT I-STAT, CHEM 8
BUN: 24 mg/dL — ABNORMAL HIGH (ref 6–23)
Calcium, Ion: 1.1 mmol/L — ABNORMAL LOW (ref 1.13–1.30)
Chloride: 98 mmol/L (ref 96–112)
Creatinine, Ser: 1.5 mg/dL — ABNORMAL HIGH (ref 0.50–1.35)
Glucose, Bld: 133 mg/dL — ABNORMAL HIGH (ref 70–99)
HEMATOCRIT: 37 % — AB (ref 39.0–52.0)
Hemoglobin: 12.6 g/dL — ABNORMAL LOW (ref 13.0–17.0)
Potassium: 3.4 mmol/L — ABNORMAL LOW (ref 3.5–5.1)
Sodium: 135 mmol/L (ref 135–145)
TCO2: 20 mmol/L (ref 0–100)

## 2014-09-24 LAB — COMPREHENSIVE METABOLIC PANEL
ALT: 48 U/L (ref 0–53)
AST: 116 U/L — ABNORMAL HIGH (ref 0–37)
Albumin: 2 g/dL — ABNORMAL LOW (ref 3.5–5.2)
Alkaline Phosphatase: 203 U/L — ABNORMAL HIGH (ref 39–117)
Anion gap: 9 (ref 5–15)
BUN: 21 mg/dL (ref 6–23)
CO2: 26 mmol/L (ref 19–32)
Calcium: 7.8 mg/dL — ABNORMAL LOW (ref 8.4–10.5)
Chloride: 98 mmol/L (ref 96–112)
Creatinine, Ser: 1.52 mg/dL — ABNORMAL HIGH (ref 0.50–1.35)
GFR calc Af Amer: 51 mL/min — ABNORMAL LOW (ref >90)
GFR calc non Af Amer: 44 mL/min — ABNORMAL LOW (ref >90)
Glucose, Bld: 98 mg/dL (ref 70–99)
Potassium: 3 mmol/L — ABNORMAL LOW (ref 3.5–5.1)
Sodium: 133 mmol/L — ABNORMAL LOW (ref 135–145)
Total Bilirubin: 11.5 mg/dL — ABNORMAL HIGH (ref 0.3–1.2)
Total Protein: 4.7 g/dL — ABNORMAL LOW (ref 6.0–8.3)

## 2014-09-24 MED ORDER — POTASSIUM CHLORIDE 10 MEQ/50ML IV SOLN: 10.0000 meq | INTRAVENOUS | Status: DC | PRN

## 2014-09-24 MED ORDER — POTASSIUM CHLORIDE 10 MEQ/50ML IV SOLN
10.0000 meq | INTRAVENOUS | Status: AC
  Administered 2014-09-24 (×3): 10 meq via INTRAVENOUS

## 2014-09-24 MED ORDER — POTASSIUM CHLORIDE ER 10 MEQ PO TBCR: 40.0000 meq | EXTENDED_RELEASE_TABLET | Freq: Two times a day (BID) | ORAL | Status: DC

## 2014-09-24 MED ORDER — SODIUM CHLORIDE 0.9 % IV SOLN
1.0000 mg/h | INTRAVENOUS | Status: DC
  Administered 2014-09-24 – 2014-09-26 (×5): 6 mg/h via INTRAVENOUS
  Administered 2014-09-27: 3 mg/h via INTRAVENOUS
  Administered 2014-09-27: 6 mg/h via INTRAVENOUS
  Filled 2014-09-24 (×9): qty 10

## 2014-09-24 MED ORDER — QUETIAPINE FUMARATE 25 MG PO TABS
25.0000 mg | ORAL_TABLET | Freq: Every day | ORAL | Status: DC
  Administered 2014-09-24 – 2014-09-25 (×2): 25 mg via ORAL
  Filled 2014-09-24 (×3): qty 1

## 2014-09-24 MED ORDER — PANTOPRAZOLE SODIUM 40 MG PO PACK
40.0000 mg | PACK | Freq: Every day | ORAL | Status: DC
  Administered 2014-09-25 – 2014-10-05 (×11): 40 mg
  Filled 2014-09-24 (×11): qty 20

## 2014-09-24 MED ORDER — POTASSIUM CHLORIDE 10 MEQ/50ML IV SOLN
INTRAVENOUS | Status: AC
  Filled 2014-09-24: qty 150

## 2014-09-24 MED ORDER — POTASSIUM CHLORIDE 10 MEQ/50ML IV SOLN
10.0000 meq | INTRAVENOUS | Status: AC | PRN
  Administered 2014-09-24 (×3): 10 meq via INTRAVENOUS
  Filled 2014-09-24 (×3): qty 50

## 2014-09-24 MED ORDER — POTASSIUM CHLORIDE 20 MEQ/15ML (10%) PO SOLN
40.0000 meq | Freq: Two times a day (BID) | ORAL | Status: DC
  Administered 2014-09-24 – 2014-09-26 (×6): 40 meq via ORAL
  Filled 2014-09-24 (×9): qty 30

## 2014-09-24 MED ORDER — FUROSEMIDE 10 MG/ML IJ SOLN
40.0000 mg | Freq: Three times a day (TID) | INTRAMUSCULAR | Status: DC
  Administered 2014-09-24 – 2014-09-25 (×3): 40 mg via INTRAVENOUS
  Filled 2014-09-24 (×6): qty 4

## 2014-09-24 MED ORDER — DIGOXIN 125 MCG PO TABS
0.1250 mg | ORAL_TABLET | ORAL | Status: DC
  Administered 2014-09-25 – 2014-10-05 (×6): 0.125 mg
  Filled 2014-09-24 (×6): qty 1

## 2014-09-24 MED ORDER — AMIODARONE LOAD VIA INFUSION
150.0000 mg | Freq: Once | INTRAVENOUS | Status: AC
  Administered 2014-09-24: 150 mg via INTRAVENOUS
  Filled 2014-09-24: qty 83.34

## 2014-09-24 NOTE — Progress Notes (Signed)
11 Days Post-Op Procedure(s) (LRB): CORONARY ARTERY BYPASS GRAFTING (CABG), ON PUMP, TIMES THREE, USING LEFT INTERNAL MAMMARY ARTERY, LEFT GREATER SAPHENOUS VEIN HARVESTED ENDOSCOPICALLY (N/A) PATENT FORAMEN OVALE CLOSURE Subjective: Awake- starting PS vent weaning Swan out Afebrile Bilirubin lower  Objective: Vital signs in last 24 hours: Temp:  [96.8 F (36 C)-98.8 F (37.1 C)] 98.1 F (36.7 C) (04/24 1236) Pulse Rate:  [78-119] 101 (04/24 1236) Cardiac Rhythm:  [-] Normal sinus rhythm (04/24 1000) Resp:  [8-37] 22 (04/24 1236) BP: (82-114)/(47-56) 82/47 mmHg (04/24 1236) SpO2:  [94 %-100 %] 99 % (04/24 1236) FiO2 (%):  [40 %] 40 % (04/24 1236) Weight:  [162 lb 0.6 oz (73.5 kg)] 162 lb 0.6 oz (73.5 kg) (04/24 0500)  Hemodynamic parameters for last 24 hours: PAP: (23-43)/(11-27) 24/17 mmHg CVP:  [6 mmHg-13 mmHg] 9 mmHg CO:  [3.9 L/min-4.4 L/min] 3.9 L/min CI:  [2.3 L/min/m2-2.6 L/min/m2] 2.3 L/min/m2  Intake/Output from previous day: 04/23 0701 - 04/24 0700 In: 4173.8 [I.V.:2463.8; NG/GT:1210; IV Piggyback:500] Out: 3255 [Urine:3185; Emesis/NG output:50; Chest Tube:20] Intake/Output this shift: Total I/O In: 747.4 [I.V.:387.4; NG/GT:260; IV Piggyback:100] Out: 305 [Urine:305]  Incision well healed  Lab Results:  Recent Labs  09/23/14 0400 09/24/14 0322  WBC 12.2* 13.1*  HGB 10.4* 10.9*  HCT 29.3* 30.7*  PLT 133* 146*   BMET:  Recent Labs  09/23/14 0400 09/24/14 0322  NA 132* 133*  K 3.3* 3.0*  CL 96 98  CO2 26 26  GLUCOSE 77 98  BUN 21 21  CREATININE 1.63* 1.52*  CALCIUM 7.7* 7.8*    PT/INR: No results for input(s): LABPROT, INR in the last 72 hours. ABG    Component Value Date/Time   PHART 7.465* 09/24/2014 0304   HCO3 27.2* 09/24/2014 0304   TCO2 28.4 09/24/2014 0304   ACIDBASEDEF 2.0 09/19/2014 1208   O2SAT 64.8 09/24/2014 0308   CBG (last 3)   Recent Labs  09/24/14 0416 09/24/14 0733 09/24/14 1245  GLUCAP 89 72 129*     Assessment/Plan: S/P Procedure(s) (LRB): CORONARY ARTERY BYPASS GRAFTING (CABG), ON PUMP, TIMES THREE, USING LEFT INTERNAL MAMMARY ARTERY, LEFT GREATER SAPHENOUS VEIN HARVESTED ENDOSCOPICALLY (N/A) PATENT FORAMEN OVALE CLOSURE Diuresis Diabetes control Continue ABX therapy due to Post-op infection Continue foley due to urinary output monitoring      LOS: 11 days    Kathlee Nationseter Van Trigt III 09/24/2014

## 2014-09-24 NOTE — Progress Notes (Signed)
Advanced Heart Failure Rounding Note   Subjective:    Remains intubated. Awake  Impella removed 4/20.   Off lasix due to low PCWP and rising creatinine. Midodrine increased to 5 tid yesterday.  Now able to wean levophed (22) and EPI (2). Co-ox 65%  Continue to autodiurese but weight unchanged. K 3.0. In/out of AF but mostly SR on amio   Did 2 hours of PS yesterday but only 30 mins today.   Cr 1.7 -> 1.6-> 1.6  Bili 16->14-> 11. RUQ u/s with gallstones but ab nontender.  CVP 10 PA 33/15 (23)  PCWP 19 SVR 1130 Thermo 3.8/2.2 Co-ox 65%   Objective:   Weight Range:  Vital Signs:   Temp:  [96.8 F (36 C)-98.8 F (37.1 C)] 98.1 F (36.7 C) (04/24 0900) Pulse Rate:  [78-119] 94 (04/24 0900) Resp:  [0-37] 22 (04/24 0900) BP: (84-114)/(46-56) 114/56 mmHg (04/24 0838) SpO2:  [94 %-100 %] 97 % (04/24 0900) FiO2 (%):  [40 %] 40 % (04/24 0838) Weight:  [73.5 kg (162 lb 0.6 oz)] 73.5 kg (162 lb 0.6 oz) (04/24 0500)    Weight change: Filed Weights   09/22/14 0400 09/23/14 0500 09/24/14 0500  Weight: 74.6 kg (164 lb 7.4 oz) 73.2 kg (161 lb 6 oz) 73.5 kg (162 lb 0.6 oz)    Intake/Output:   Intake/Output Summary (Last 24 hours) at 09/24/14 0925 Last data filed at 09/24/14 0900  Gross per 24 hour  Intake 4141.73 ml  Output   2765 ml  Net 1376.73 ml     Physical Exam: General:  Intubated sedated. Jaundice improving HEENT: ETT. Scleral icterus.  Neck: supple.  RIJ swan Carotids 2+ bilat; no bruits. No lymphadenopathy or thryomegaly appreciated. Cor: Sternal dressing. +CTs PMI nondisplaced. Irregular rate & rhythm. Lungs: clear Abdomen: soft, nontender, + distended. No hepatosplenomegaly. No bruits or masses. Hypoactive bowel sounds. Extremities: no cyanosis, clubbing, rash, 3+ edema Neuro: alert & orientedx3, cranial nerves grossly intact. moves all 4 extremities w/o difficulty. Affect pleasant  Telemetry: AF 110 this am  Labs: Basic Metabolic Panel:  Recent  Labs Lab 09/19/14 0500  09/20/14 0400  09/21/14 0420 09/21/14 1800 09/22/14 0230 09/22/14 1550 09/23/14 0400 09/24/14 0322  NA 132*  < > 131*  < > 128* 131* 131* 132* 132* 133*  K 3.8  < > 3.4*  < > 3.5 3.8 3.5 3.9 3.3* 3.0*  CL 103  < > 98  < > 92*  --  94* 93* 96 98  CO2 23  --  26  --  26  --  27  --  26 26  GLUCOSE 127*  < > 134*  < > 176* 158* 111* 96 77 98  BUN 11  < > 12  < > 16  --  19 28* 21 21  CREATININE 1.03  < > 1.19  < > 1.52*  --  1.68* 1.80* 1.63* 1.52*  CALCIUM 7.3*  --  7.0*  --  7.0*  --  7.0*  --  7.7* 7.8*  MG 1.7  --   --   --   --   --   --   --   --   --   PHOS 3.1  --   --   --   --   --   --   --   --   --   < > = values in this interval not displayed.  Liver Function Tests:  Recent Labs Lab 09/20/14  0400 09/21/14 0420 09/22/14 0230 09/22/14 1145 09/23/14 0400 09/24/14 0322  AST 63* 60* 75*  --  94* 116*  ALT 24 24 35  --  39 48  ALKPHOS 83 99 181*  --  161* 203*  BILITOT 13.0*  13.3* 13.9* 16.2* 15.8* 14.9* 11.5*  PROT 4.2* 4.9* 4.9*  --  4.8* 4.7*  ALBUMIN 2.1* 2.4* 2.1*  --  2.5* 2.0*   No results for input(s): LIPASE, AMYLASE in the last 168 hours. No results for input(s): AMMONIA in the last 168 hours.  CBC:  Recent Labs Lab 09/20/14 1530  09/21/14 0420 09/21/14 1800 09/22/14 0230 09/22/14 1550 09/23/14 0400 09/24/14 0322  WBC 10.0  --  9.5  --  12.6*  --  12.2* 13.1*  NEUTROABS 8.4*  --  8.0*  --  11.0*  --  11.1* 12.0*  HGB 10.1*  < > 9.4* 10.5* 11.1* 11.9* 10.4* 10.9*  HCT 28.1*  < > 26.5* 31.0* 30.9* 35.0* 29.3* 30.7*  MCV 83.9  --  84.7  --  85.4  --  83.0 85.3  PLT 89*  --  90*  --  111*  --  133* 146*  < > = values in this interval not displayed.  Cardiac Enzymes: No results for input(s): CKTOTAL, CKMB, CKMBINDEX, TROPONINI in the last 168 hours.  BNP: BNP (last 3 results)  Recent Labs  09/13/14 1310  BNP 96.8    ProBNP (last 3 results) No results for input(s): PROBNP in the last 8760 hours.    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 Ventilator dependent respiratory failure. Followup edema and effusions.  EXAM: PORTABLE CHEST - 1 VIEW  COMPARISON:  Portable chest x-rays yesterday and earlier.  FINDINGS: Endotracheal tube tip in satisfactory position projecting approximately 5 cm above the carina. Bilateral chest tubes in place with no pneumothorax. Right jugular Swan-Ganz catheter tip projects over the left main pulmonary artery. Nasogastric tube courses below the diaphragm into the stomach.  Resolution of interstitial pulmonary edema since yesterday. Persistent bilateral pleural effusions, unchanged to slightly improved. Persistent atelectasis in the lung bases, unchanged. No new pulmonary parenchymal abnormalities.  IMPRESSION: 1. Support apparatus satisfactory. 2. Resolution of interstitial pulmonary edema since yesterday. 3. Stable to slight improvement in bilateral pleural effusions. 4. Stable bibasilar atelectasis. 5. No new abnormalities.   Electronically Signed   By: Hulan Saashomas  Lawrence M.D.   On: 09/23/2014 09:41   Dg Abd Portable 1v  09/22/2014   CLINICAL DATA:  Ileus.  EXAM: PORTABLE ABDOMEN - 1 VIEW  COMPARISON:  09/17/2014  FINDINGS: Nasogastric tube has tip and side-port over the stomach in the left upper quadrant to midline abdomen. Double feeding tube is present with tip just right of midline likely over the distal stomach. The ends of patient's chest tubes are projected over the mid abdomen. Small caliber catheter projected over the rectum.  Bowel gas pattern is nonobstructive. Remainder of the exam is unchanged.  IMPRESSION: Nonobstructive bowel gas pattern.  Tubes and lines as described.   Electronically Signed   By: Elberta Fortisaniel  Boyle M.D.   On: 09/22/2014 09:42     Medications:     Scheduled Medications: . sodium chloride   Intravenous Once  . amiodarone  150 mg Intravenous Once  . antiseptic oral rinse  7 mL Mouth  Rinse QID  . aspirin EC  325 mg Oral Daily   Or  . aspirin  324 mg Per Tube Daily  . atorvastatin  40 mg Oral q1800  . bisacodyl  10 mg Oral Daily   Or  . bisacodyl  10 mg Rectal Daily  . budesonide (PULMICORT) nebulizer solution  0.5 mg Nebulization BID  . cefTAZidime (FORTAZ)  IV  1 g Intravenous Q8H  . chlorhexidine  15 mL Mouth Rinse BID  . digoxin  0.125 mg Intravenous QODAY  . docusate  200 mg Per Tube Daily  . enoxaparin (LOVENOX) injection  40 mg Subcutaneous Daily  . feeding supplement (VITAL 1.5 CAL)  1,000 mL Per Tube Q24H  . insulin aspart  0-9 Units Subcutaneous 6 times per day  . insulin detemir  15 Units Subcutaneous BID  . midodrine  5 mg Oral TID WC  . pantoprazole (PROTONIX) IV  40 mg Intravenous Q24H  . sodium chloride  10-40 mL Intracatheter Q12H  . sodium chloride  3 mL Intravenous Q12H  . vancomycin  1,000 mg Intravenous Q24H    Infusions: . sodium chloride 10 mL/hr (09/24/14 0919)  . sodium chloride 250 mL (09/22/14 0600)  . amiodarone 30 mg/hr (09/24/14 0800)  . dexmedetomidine 1 mcg/kg/hr (09/24/14 0800)  . DOPamine 5 mcg/kg/min (09/24/14 0800)  . EPINEPHrine 4 mg in dextrose 5% 250 mL infusion (16 mcg/mL) 2 mcg/min (09/24/14 0800)  . lactated ringers Stopped (09/18/14 1204)  . lactated ringers 10 mL/hr at 09/22/14 2257  . midazolam (VERSED) infusion 6 mg/hr (09/24/14 0800)  . milrinone 0.25 mcg/kg/min (09/24/14 0800)  . norepinephrine (LEVOPHED) Adult infusion 22 mcg/min (09/24/14 0800)    PRN Medications: sodium chloride, albumin human, ipratropium-albuterol, levalbuterol, midazolam, morphine injection, ondansetron (ZOFRAN) IV, oxyCODONE, sodium chloride, sodium chloride, traMADol   Assessment:   1. Cardiogenic shock 2. Acute systolic HF EF 25-30% by TEE 4/15 3. Acute anterolateral STEMI    --Emergent CABG 4/13 4. Acute respiratory failure 5. Thrombocytopenia 6. Acute renal failure, stage III 7. Hyponatremia/hyponkalemia 8. PAF with  RVR 9. Hyperbilirubinemia  Plan/Discussion:    Pressor requirements coming down. Will continue midodrine at 5 tid. Continue to wean pressors as tolerated.   Volume up. Start lasix 40 IV tid. Pull swan today and follow CVP with PICC.   Failing vent wean with severe COPD. Will likely need trach this week.   Mostly in SR. Continue IV amio. Will need coumadin after trach.   Hyberbilirubinemia improving. Suspect combination of hemolysis and cholestatic picture. Hopefully will continue to improve as he improves.   WBC count rising a little bit with left shift. UA negative for infection. Continue vancomycin and ceftaz.   K being replaced.   The patient is critically ill with multiple organ systems failure and requires high complexity decision making for assessment and support, frequent evaluation and titration of therapies, application of advanced monitoring technologies and extensive interpretation of multiple databases.   Critical Care Time devoted to patient care services described in this note is 45 Minutes.   Length of Stay: 11 Arvilla Meres MD  09/24/2014, 9:25 AM  Advanced Heart Failure Team Pager [phone] (M-F; 7a - 4p)  Please contact [facility] for night-coverage after hours (4p -7a ) and weekends on [email or web address]

## 2014-09-24 NOTE — Progress Notes (Signed)
Daily Rounding Note  09/24/2014, 12:02 PM  LOS: 11 days   SUBJECTIVE:       No complaints, denies pain.  Has told staff he would like ETT removed and to drink pepsi.   OBJECTIVE:         Vital signs in last 24 hours:    Temp:  [96.8 F (36 C)-98.8 F (37.1 C)] 98.1 F (36.7 C) (04/24 1130) Pulse Rate:  [78-119] 110 (04/24 1130) Resp:  [8-37] 26 (04/24 1130) BP: (99-114)/(51-56) 114/56 mmHg (04/24 0838) SpO2:  [94 %-100 %] 98 % (04/24 1130) FiO2 (%):  [40 %] 40 % (04/24 1000) Weight:  [162 lb 0.6 oz (73.5 kg)] 162 lb 0.6 oz (73.5 kg) (04/24 0500)   Filed Weights   09/22/14 0400 09/23/14 0500 09/24/14 0500  Weight: 164 lb 7.4 oz (74.6 kg) 161 lb 6 oz (73.2 kg) 162 lb 0.6 oz (73.5 kg)   General: jaundiced, ill looking still on vent, looks exhausted.    Heart: RRR Chest: clear bil in front drainage tubes bil hooked to water seal Abdomen: soft, NT, BS present.  Scrotal edema.  Extremities: 3+ edema.  Neuro/Psych:  Able to respond appropriately with nods.  Tired. Alert/oriented x 3.  Moving all 4 limbs.   Intake/Output from previous day: 04/23 0701 - 04/24 0700 In: 4173.8 [I.V.:2463.8; NG/GT:1210; IV Piggyback:500] Out: 7530 [Urine:3185; Emesis/NG output:50; Chest Tube:20]  Intake/Output this shift: Total I/O In: 747.4 [I.V.:387.4; NG/GT:260; IV Piggyback:100] Out: 305 [Urine:305]  Lab Results:  Recent Labs  09/22/14 0230 09/22/14 1550 09/23/14 0400 09/24/14 0322  WBC 12.6*  --  12.2* 13.1*  HGB 11.1* 11.9* 10.4* 10.9*  HCT 30.9* 35.0* 29.3* 30.7*  PLT 111*  --  133* 146*   BMET  Recent Labs  09/22/14 0230 09/22/14 1550 09/23/14 0400 09/24/14 0322  NA 131* 132* 132* 133*  K 3.5 3.9 3.3* 3.0*  CL 94* 93* 96 98  CO2 27  --  26 26  GLUCOSE 111* 96 77 98  BUN 19 28* 21 21  CREATININE 1.68* 1.80* 1.63* 1.52*  CALCIUM 7.0*  --  7.7* 7.8*   LFT  Recent Labs  09/22/14 0230 09/22/14 1145  09/23/14 0400 09/24/14 0322  PROT 4.9*  --  4.8* 4.7*  ALBUMIN 2.1*  --  2.5* 2.0*  AST 75*  --  94* 116*  ALT 35  --  39 48  ALKPHOS 181*  --  161* 203*  BILITOT 16.2* 15.8* 14.9* 11.5*  BILIDIR  --  9.9*  --   --   IBILI  --  5.9*  --   --    PT/INR No results for input(s): LABPROT, INR in the last 72 hours. Hepatitis Panel No results for input(s): HEPBSAG, HCVAB, HEPAIGM, HEPBIGM in the last 72 hours.  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 Ventilator dependent respiratory failure. Followup edema and effusions.  EXAM: PORTABLE CHEST - 1 VIEW  COMPARISON:  Portable chest x-rays yesterday and earlier.  FINDINGS: Endotracheal tube tip in satisfactory position projecting approximately 5 cm above the carina. Bilateral chest tubes in place with no pneumothorax. Right jugular Swan-Ganz catheter tip projects over the left main pulmonary artery. Nasogastric tube courses below the diaphragm into the stomach.  Resolution of interstitial pulmonary edema since yesterday. Persistent bilateral pleural effusions, unchanged to slightly improved. Persistent atelectasis in the lung bases, unchanged. No new pulmonary  parenchymal abnormalities.  IMPRESSION: 1. Support apparatus satisfactory. 2. Resolution of interstitial pulmonary edema since yesterday. 3. Stable to slight improvement in bilateral pleural effusions. 4. Stable bibasilar atelectasis. 5. No new abnormalities.   Electronically Signed   By: Evangeline Dakin M.D.   On: 09/23/2014 09:41    ASSESMENT:   * Jaundice, cholestasis. Majority of bili is direct/conjugated.Suspect combination of hemolysis and cholestatic picture. Normal PT/INR. No parenchymal liver disease but + gallstones and GB wall thickening on ultrasound.  T bili better, AST/alk phos and LDH worse.   * Acute STEMI, s/p emergent CABG/PFO repair/Impela placement (4/13-4/20), post op cardiogenic shock, arrythmia/tachycardia  requiring coardiversion. Still intubated, on multiple pressors but requirement coming down, on Midodrine.   * Afib. On Lovenox.   * ABL anemia. S/p 15 PRBCs thus far. Last transfused 4/22.   * Coagulopathy, thrombocytopenia. S/p transfusion with multiples of FFP, platelets.    PLAN   *  Supportive care, trend LFTs.     Azucena Freed  09/24/2014, 12:02 PM Pager: [phone]

## 2014-09-25 ENCOUNTER — Inpatient Hospital Stay (HOSPITAL_COMMUNITY): Payer: Medicare Other

## 2014-09-25 LAB — COMPREHENSIVE METABOLIC PANEL
ALT: 68 U/L — ABNORMAL HIGH (ref 0–53)
AST: 137 U/L — ABNORMAL HIGH (ref 0–37)
Albumin: 2 g/dL — ABNORMAL LOW (ref 3.5–5.2)
Alkaline Phosphatase: 339 U/L — ABNORMAL HIGH (ref 39–117)
Anion gap: 9 (ref 5–15)
BUN: 27 mg/dL — ABNORMAL HIGH (ref 6–23)
CO2: 25 mmol/L (ref 19–32)
Calcium: 7.8 mg/dL — ABNORMAL LOW (ref 8.4–10.5)
Chloride: 99 mmol/L (ref 96–112)
Creatinine, Ser: 1.72 mg/dL — ABNORMAL HIGH (ref 0.50–1.35)
GFR calc Af Amer: 44 mL/min — ABNORMAL LOW (ref >90)
GFR calc non Af Amer: 38 mL/min — ABNORMAL LOW (ref >90)
Glucose, Bld: 127 mg/dL — ABNORMAL HIGH (ref 70–99)
Potassium: 4.2 mmol/L (ref 3.5–5.1)
Sodium: 133 mmol/L — ABNORMAL LOW (ref 135–145)
Total Bilirubin: 7.7 mg/dL — ABNORMAL HIGH (ref 0.3–1.2)
Total Protein: 4.8 g/dL — ABNORMAL LOW (ref 6.0–8.3)

## 2014-09-25 LAB — GLUCOSE, CAPILLARY
GLUCOSE-CAPILLARY: 121 mg/dL — AB (ref 70–99)
GLUCOSE-CAPILLARY: 142 mg/dL — AB (ref 70–99)
Glucose-Capillary: 111 mg/dL — ABNORMAL HIGH (ref 70–99)
Glucose-Capillary: 131 mg/dL — ABNORMAL HIGH (ref 70–99)
Glucose-Capillary: 133 mg/dL — ABNORMAL HIGH (ref 70–99)
Glucose-Capillary: 158 mg/dL — ABNORMAL HIGH (ref 70–99)

## 2014-09-25 LAB — BLOOD GAS, ARTERIAL
Acid-Base Excess: 2.6 mmol/L — ABNORMAL HIGH (ref 0.0–2.0)
Bicarbonate: 26.2 mEq/L — ABNORMAL HIGH (ref 20.0–24.0)
Drawn by: 398981
FIO2: 40 %
MECHVT: 600 mL
O2 Saturation: 97.3 %
PEEP: 5 cmH2O
Patient temperature: 98.6
RATE: 16 resp/min
TCO2: 27.4 mmol/L (ref 0–100)
pCO2 arterial: 37.5 mmHg (ref 35.0–45.0)
pH, Arterial: 7.459 — ABNORMAL HIGH (ref 7.350–7.450)
pO2, Arterial: 76.4 mmHg — ABNORMAL LOW (ref 80.0–100.0)

## 2014-09-25 LAB — CARBOXYHEMOGLOBIN
Carboxyhemoglobin: 1.5 % (ref 0.5–1.5)
Methemoglobin: 0.9 % (ref 0.0–1.5)
O2 SAT: 66.1 %
TOTAL HEMOGLOBIN: 9.1 g/dL — AB (ref 13.5–18.0)

## 2014-09-25 LAB — CBC WITH DIFFERENTIAL/PLATELET
Basophils Absolute: 0 10*3/uL (ref 0.0–0.1)
Basophils Relative: 0 % (ref 0–1)
Eosinophils Absolute: 0.1 10*3/uL (ref 0.0–0.7)
Eosinophils Relative: 1 % (ref 0–5)
HCT: 31 % — ABNORMAL LOW (ref 39.0–52.0)
Hemoglobin: 10.9 g/dL — ABNORMAL LOW (ref 13.0–17.0)
Lymphocytes Relative: 2 % — ABNORMAL LOW (ref 12–46)
Lymphs Abs: 0.3 10*3/uL — ABNORMAL LOW (ref 0.7–4.0)
MCH: 30.4 pg (ref 26.0–34.0)
MCHC: 35.2 g/dL (ref 30.0–36.0)
MCV: 86.4 fL (ref 78.0–100.0)
Monocytes Absolute: 0.8 10*3/uL (ref 0.1–1.0)
Monocytes Relative: 6 % (ref 3–12)
Neutro Abs: 13.5 10*3/uL — ABNORMAL HIGH (ref 1.7–7.7)
Neutrophils Relative %: 91 % — ABNORMAL HIGH (ref 43–77)
Platelets: 176 10*3/uL (ref 150–400)
RBC: 3.59 MIL/uL — ABNORMAL LOW (ref 4.22–5.81)
RDW: 18 % — ABNORMAL HIGH (ref 11.5–15.5)
WBC: 14.7 10*3/uL — ABNORMAL HIGH (ref 4.0–10.5)

## 2014-09-25 MED ORDER — LIDOCAINE-PRILOCAINE 2.5-2.5 % EX CREA
1.0000 "application " | TOPICAL_CREAM | Freq: Once | CUTANEOUS | Status: DC
  Filled 2014-09-25: qty 5

## 2014-09-25 MED ORDER — MIDODRINE HCL 5 MG PO TABS
7.5000 mg | ORAL_TABLET | Freq: Three times a day (TID) | ORAL | Status: DC
  Administered 2014-09-25 – 2014-09-27 (×5): 7.5 mg via ORAL
  Filled 2014-09-25 (×9): qty 1

## 2014-09-25 MED ORDER — OXYMETAZOLINE HCL 0.05 % NA SOLN
2.0000 | Freq: Once | NASAL | Status: DC
  Filled 2014-09-25: qty 15

## 2014-09-25 MED ORDER — MIDAZOLAM HCL 2 MG/2ML IJ SOLN: 1.0000 mg | INTRAMUSCULAR | Status: DC | PRN

## 2014-09-25 NOTE — Progress Notes (Signed)
Advanced Heart Failure Rounding Note   Subjective:    Remains intubated. Awake  Impella removed 4/20. Swan out 4/24  Received lasix yesterday. But weight actually up. CVP down to 8-9. Creatinine rising again. On midodrine 5 tid.  Now able to wean levophed (18) and EPI (2). Co-ox 66%   In/out of AF but mostly SR on amio   Failed vent wean again today.   Bili coming down    Objective:   Weight Range:  Vital Signs:   Temp:  [96.2 F (35.7 C)-98.6 F (37 C)] 98.6 F (37 C) (04/25 0722) Pulse Rate:  [66-116] 93 (04/25 1045) Resp:  [19-35] 32 (04/25 1045) BP: (82-103)/(47-49) 103/47 mmHg (04/24 2332) SpO2:  [78 %-100 %] 95 % (04/25 1045) FiO2 (%):  [40 %] 40 % (04/25 1000) Weight:  [74.8 kg (164 lb 14.5 oz)] 74.8 kg (164 lb 14.5 oz) (04/25 0500)    Weight change: Filed Weights   09/23/14 0500 09/24/14 0500 09/25/14 0500  Weight: 73.2 kg (161 lb 6 oz) 73.5 kg (162 lb 0.6 oz) 74.8 kg (164 lb 14.5 oz)    Intake/Output:   Intake/Output Summary (Last 24 hours) at 09/25/14 1110 Last data filed at 09/25/14 1000  Gross per 24 hour  Intake 3769.79 ml  Output   3490 ml  Net 279.79 ml     Physical Exam: General:  Intubated More awake HEENT: ETT. Scleral icterus.  Neck: supple.  RIJ swan Carotids 2+ bilat; no bruits. No lymphadenopathy or thryomegaly appreciated. Cor: Sternal dressing. +CTs PMI nondisplaced. Regular rate & rhythm. Lungs: clear Abdomen: soft, nontender, + distended. No hepatosplenomegaly. No bruits or masses. Hypoactive bowel sounds. Extremities: no cyanosis, clubbing, rash, 3+ edema Neuro: alert & orientedx3, cranial nerves grossly intact. moves all 4 extremities w/o difficulty. Affect pleasant  Telemetry: NSR  Labs: Basic Metabolic Panel:  Recent Labs Lab 09/19/14 0500  09/21/14 0420  09/22/14 0230 09/22/14 1550 09/23/14 0400 09/24/14 0322 09/24/14 1708 09/25/14 0355  NA 132*  < > 128*  < > 131* 132* 132* 133* 135 133*  K 3.8  < > 3.5  < >  3.5 3.9 3.3* 3.0* 3.4* 4.2  CL 103  < > 92*  --  94* 93* 96 98 98 99  CO2 23  < > 26  --  27  --  26 26  --  25  GLUCOSE 127*  < > 176*  < > 111* 96 77 98 133* 127*  BUN 11  < > 16  --  19 28* 21 21 24* 27*  CREATININE 1.03  < > 1.52*  --  1.68* 1.80* 1.63* 1.52* 1.50* 1.72*  CALCIUM 7.3*  < > 7.0*  --  7.0*  --  7.7* 7.8*  --  7.8*  MG 1.7  --   --   --   --   --   --   --   --   --   PHOS 3.1  --   --   --   --   --   --   --   --   --   < > = values in this interval not displayed.  Liver Function Tests:  Recent Labs Lab 09/21/14 0420 09/22/14 0230 09/22/14 1145 09/23/14 0400 09/24/14 0322 09/25/14 0355  AST 60* 75*  --  94* 116* 137*  ALT 24 35  --  39 48 68*  ALKPHOS 99 181*  --  161* 203* 339*  BILITOT 13.9* 16.2*  15.8* 14.9* 11.5* 7.7*  PROT 4.9* 4.9*  --  4.8* 4.7* 4.8*  ALBUMIN 2.4* 2.1*  --  2.5* 2.0* 2.0*   No results for input(s): LIPASE, AMYLASE in the last 168 hours. No results for input(s): AMMONIA in the last 168 hours.  CBC:  Recent Labs Lab 09/21/14 0420  09/22/14 0230 09/22/14 1550 09/23/14 0400 09/24/14 0322 09/24/14 1708 09/25/14 0355  WBC 9.5  --  12.6*  --  12.2* 13.1*  --  14.7*  NEUTROABS 8.0*  --  11.0*  --  11.1* 12.0*  --  13.5*  HGB 9.4*  < > 11.1* 11.9* 10.4* 10.9* 12.6* 10.9*  HCT 26.5*  < > 30.9* 35.0* 29.3* 30.7* 37.0* 31.0*  MCV 84.7  --  85.4  --  83.0 85.3  --  86.4  PLT 90*  --  111*  --  133* 146*  --  176  < > = values in this interval not displayed.  Cardiac Enzymes: No results for input(s): CKTOTAL, CKMB, CKMBINDEX, TROPONINI in the last 168 hours.  BNP: BNP (last 3 results)  Recent Labs  09/13/14 1310  BNP 96.8    ProBNP (last 3 results) No results for input(s): PROBNP in the last 8760 hours.    Other results:  Imaging: Dg Chest Port 1 View  09/25/2014   CLINICAL DATA:  Chest tubes.  EXAM: PORTABLE CHEST - 1 VIEW  COMPARISON:  September 23, 2014.  FINDINGS: Mild bilateral pleural effusions are again noted and  stable. Right subclavian catheter is noted with distal tip overlying expected position of SVC. Status post coronary artery bypass graft. Nasogastric and feeding tube are seen entering stomach. Endotracheal tube is not clearly visualized and potentially has been removed.  IMPRESSION: Left-sided chest tube is noted without pneumothorax. Stable bibasilar opacities are noted most likely due to combination of subsegmental atelectasis and mild pleural effusions.   Electronically Signed   By: Lupita Raider, M.D.   On: 09/25/2014 08:03     Medications:     Scheduled Medications: . amiodarone  150 mg Intravenous Once  . antiseptic oral rinse  7 mL Mouth Rinse QID  . aspirin EC  325 mg Oral Daily   Or  . aspirin  324 mg Per Tube Daily  . atorvastatin  40 mg Oral q1800  . bisacodyl  10 mg Oral Daily   Or  . bisacodyl  10 mg Rectal Daily  . budesonide (PULMICORT) nebulizer solution  0.5 mg Nebulization BID  . cefTAZidime (FORTAZ)  IV  1 g Intravenous Q8H  . chlorhexidine  15 mL Mouth Rinse BID  . digoxin  0.125 mg Intravenous QODAY  . digoxin  0.125 mg Per Tube QODAY  . enoxaparin (LOVENOX) injection  40 mg Subcutaneous Daily  . feeding supplement (VITAL 1.5 CAL)  1,000 mL Per Tube Q24H  . insulin aspart  0-9 Units Subcutaneous 6 times per day  . insulin detemir  15 Units Subcutaneous BID  . midodrine  5 mg Oral TID WC  . pantoprazole sodium  40 mg Per Tube Daily  . potassium chloride  40 mEq Oral BID  . QUEtiapine  25 mg Oral QHS  . sodium chloride  10-40 mL Intracatheter Q12H  . sodium chloride  3 mL Intravenous Q12H  . vancomycin  1,000 mg Intravenous Q24H    Infusions: . sodium chloride 10 mL/hr (09/24/14 0919)  . sodium chloride 250 mL (09/22/14 0600)  . amiodarone 30 mg/hr (09/25/14 0815)  .  dexmedetomidine 1 mcg/kg/hr (09/25/14 0815)  . DOPamine 5 mcg/kg/min (09/25/14 0800)  . EPINEPHrine 4 mg in dextrose 5% 250 mL infusion (16 mcg/mL) 1 mcg/min (09/25/14 1000)  . lactated  ringers Stopped (09/18/14 1204)  . lactated ringers 10 mL/hr at 09/22/14 2257  . midazolam (VERSED) infusion 6 mg/hr (09/25/14 0800)  . milrinone 0.25 mcg/kg/min (09/25/14 0800)  . norepinephrine (LEVOPHED) Adult infusion 16 mcg/min (09/25/14 0945)    PRN Medications: sodium chloride, ipratropium-albuterol, levalbuterol, midazolam, morphine injection, ondansetron (ZOFRAN) IV, oxyCODONE, potassium chloride, sodium chloride, sodium chloride, traMADol   Assessment:   1. Cardiogenic shock 2. Acute systolic HF EF 25-30% by TEE 4/15 3. Acute anterolateral STEMI    --Emergent CABG 4/13 4. Acute respiratory failure 5. Thrombocytopenia 6. Acute renal failure, stage III 7. Hyponatremia/hyponkalemia 8. PAF with RVR 9. Hyperbilirubinemia  Plan/Discussion:    Pressor requirements coming down. Will continue midodrine at 5 tid. Continue to wean pressors as tolerated. Can increase midodrine as needed  Creatinine rising again. He is 3rd spacing fluids. Will hold lasix again. Place TED hose. May start low dose lasix drip tomorrow.   Failing vent wean with severe COPD. Will likely need trach this week.   Mostly in SR. Continue IV amio. Will need coumadin after trach.   Hyberbilirubinemia improving. Suspect combination of hemolysis and cholestatic picture. Hopefully will continue to improve as he improves.   WBC climbing. Continue vanc and ceftaz. Will get bcx.   The patient is critically ill with multiple organ systems failure and requires high complexity decision making for assessment and support, frequent evaluation and titration of therapies, application of advanced monitoring technologies and extensive interpretation of multiple databases.   Critical Care Time devoted to patient care services described in this note is 35 Minutes.   Length of Stay: 12 Arvilla Meresaniel Demarrio Menges MD  09/25/2014, 11:10 AM  Advanced Heart Failure Team Pager [phone] (M-F; 7a - 4p)  Please contact [facility]  for night-coverage after hours (4p -7a ) and weekends on [email or web address]

## 2014-09-25 NOTE — Progress Notes (Signed)
RT removed Aline per order. Pressure held for 5 minutes. Bleeding stopped. Dressing applied.

## 2014-09-25 NOTE — Progress Notes (Signed)
ANTIBIOTIC CONSULT NOTE - INITIAL  Pharmacy Consult for vancomycin    Indication: rule out pneumonia and leukocytosis  Allergies  Allergen Reactions  . Penicillins Nausea And Vomiting    Patient Measurements: Height: 5\' 7"  (170.2 cm) Weight: 164 lb 14.5 oz (74.8 kg) IBW/kg (Calculated) : 66.1 Adjusted Body Weight: n/a  Vital Signs: Temp: 97.3 F (36.3 C) (04/25 1144) Temp Source: Oral (04/25 1144) BP: 82/44 mmHg (04/25 1220) Pulse Rate: 93 (04/25 1245) Intake/Output from previous day: 04/24 0701 - 04/25 0700 In: 3987.9 [I.V.:2277.9; NG/GT:1160; IV Piggyback:550] Out: 3250 [Urine:3210; Chest Tube:40] Intake/Output from this shift: Total I/O In: 940.2 [I.V.:415.2; NG/GT:475; IV Piggyback:50] Out: 820 [Urine:820]  Labs:  Recent Labs  09/23/14 0400 09/24/14 0322 09/24/14 1708 09/25/14 0355  WBC 12.2* 13.1*  --  14.7*  HGB 10.4* 10.9* 12.6* 10.9*  PLT 133* 146*  --  176  CREATININE 1.63* 1.52* 1.50* 1.72*   Estimated Creatinine Clearance: 36.8 mL/min (by C-G formula based on Cr of 1.72). No results for input(s): VANCOTROUGH, VANCOPEAK, VANCORANDOM, GENTTROUGH, GENTPEAK, GENTRANDOM, TOBRATROUGH, TOBRAPEAK, TOBRARND, AMIKACINPEAK, AMIKACINTROU, AMIKACIN in the last 72 hours.   Microbiology: Recent Results (from the past 720 hour(s))  MRSA PCR Screening     Status: None   Collection Time: 09/13/14  4:51 PM  Result Value Ref Range Status   MRSA by PCR NEGATIVE NEGATIVE Final    Comment:        The GeneXpert MRSA Assay (FDA approved for NASAL specimens only), is one component of a comprehensive MRSA colonization surveillance program. It is not intended to diagnose MRSA infection nor to guide or monitor treatment for MRSA infections.   Culture, respiratory (NON-Expectorated)     Status: None   Collection Time: 09/21/14  6:48 AM  Result Value Ref Range Status   Specimen Description TRACHEAL ASPIRATE  Final   Special Requests Normal  Final   Gram Stain   Final     MODERATE WBC PRESENT, PREDOMINANTLY PMN RARE SQUAMOUS EPITHELIAL CELLS PRESENT NO ORGANISMS SEEN [facility]    Culture   Final    FEW YEAST CONSISTENT WITH CANDIDA SPECIES Performed at [facility]    Report Status 09/23/2014 FINAL  Final    Medical History: Past Medical History  Diagnosis Date  . Dyspnea   . Palpitations   . Anxiety and depression   . GERD (gastroesophageal reflux disease)   . Hypertension   . Hypercholesteremia    Assessment: 74 yo male now off Impella, on vent with concern for pneumonia currently on day#2 of vancomycin and fortaz.   Scr continues to rise 1.1>1.4>1.5>1.7 this am. No fevers noted, wbc now up to 14. Plan to re culture today.   Will recheck Vancomycin trough tonight. With vanc level now being a send out will d/c scheduled doses and reorder once resulted.   4/21 resp cx - ngtd 4/25 bld   Goal of Therapy:  Vancomycin trough level 15-20 mcg/ml  Plan:  1. Check VT tonight 2. Continue fortaz 1g q12 hour 3. F/u WBC, clinical course, culture data  Sheppard CoilFrank Daissy Yerian PharmD., BCPS Clinical Pharmacist Pager [phone] 09/25/2014 1:23 PM

## 2014-09-25 NOTE — Progress Notes (Signed)
UR Completed.  [phone]  

## 2014-09-25 NOTE — Progress Notes (Signed)
12 Days Post-Op Procedure(s) (LRB): CORONARY ARTERY BYPASS GRAFTING (CABG), ON PUMP, TIMES THREE, USING LEFT INTERNAL MAMMARY ARTERY, LEFT GREATER SAPHENOUS VEIN HARVESTED ENDOSCOPICALLY (N/A) PATENT FORAMEN OVALE CLOSURE Subjective: Patient remains ventilator dependent and has failed several attempts to wean ventilator Patient has severe underlying COPD and has undergone emergency CABG for acute MI with shock We'll plan tracheostomy to optimize ventilator wean and 2 prevent ventilator associated pneumonia  Objective: Vital signs in last 24 hours: Temp:  [96.2 F (35.7 C)-98.6 F (37 C)] 98.3 F (36.8 C) (04/25 1630) Pulse Rate:  [62-116] 89 (04/25 1730) Cardiac Rhythm:  [-] Normal sinus rhythm (04/25 1600) Resp:  [19-35] 22 (04/25 1730) BP: (69-108)/(34-81) 92/52 mmHg (04/25 1700) SpO2:  [78 %-100 %] 99 % (04/25 1730) FiO2 (%):  [40 %] 40 % (04/25 1600) Weight:  [164 lb 14.5 oz (74.8 kg)] 164 lb 14.5 oz (74.8 kg) (04/25 0500)  Hemodynamic parameters for last 24 hours: CVP:  [9 mmHg-11 mmHg] 10 mmHg  Intake/Output from previous day: 04/24 0701 - 04/25 0700 In: 3987.9 [I.V.:2277.9; NG/GT:1160; IV Piggyback:550] Out: 3250 [Urine:3210; Chest Tube:40] Intake/Output this shift: Total I/O In: 1776.6 [I.V.:841.6; NG/GT:835; IV Piggyback:100] Out: 1120 [Urine:1120]  Scattered rhonchi Extremities warm  Lab Results:  Recent Labs  09/24/14 0322 09/24/14 1708 09/25/14 0355  WBC 13.1*  --  14.7*  HGB 10.9* 12.6* 10.9*  HCT 30.7* 37.0* 31.0*  PLT 146*  --  176   BMET:  Recent Labs  09/24/14 0322 09/24/14 1708 09/25/14 0355  NA 133* 135 133*  K 3.0* 3.4* 4.2  CL 98 98 99  CO2 26  --  25  GLUCOSE 98 133* 127*  BUN 21 24* 27*  CREATININE 1.52* 1.50* 1.72*  CALCIUM 7.8*  --  7.8*    PT/INR: No results for input(s): LABPROT, INR in the last 72 hours. ABG    Component Value Date/Time   PHART 7.459* 09/25/2014 0341   HCO3 26.2* 09/25/2014 0341   TCO2 27.4 09/25/2014  0341   ACIDBASEDEF 2.0 09/19/2014 1208   O2SAT 66.1 09/25/2014 0349   CBG (last 3)   Recent Labs  09/25/14 0803 09/25/14 1201 09/25/14 1652  GLUCAP 131* 133* 142*    Assessment/Plan: S/P Procedure(s) (LRB): CORONARY ARTERY BYPASS GRAFTING (CABG), ON PUMP, TIMES THREE, USING LEFT INTERNAL MAMMARY ARTERY, LEFT GREATER SAPHENOUS VEIN HARVESTED ENDOSCOPICALLY (N/A) PATENT FORAMEN OVALE CLOSURE We'll stop tube feeds and plan tracheostomy tomorrow early afternoon   LOS: 12 days    Kathlee Nationseter Van Trigt III 09/25/2014

## 2014-09-25 NOTE — Procedures (Signed)
Arterial Catheter Insertion Procedure Note [name] [id number] 13-Oct-1940  Procedure: Insertion of Arterial Catheter  Indications: Blood pressure monitoring and Frequent blood sampling  Procedure Details Consent: Unable to obtain consent because of altered level of consciousness. Time Out: Verified patient identification, verified procedure, site/side was marked, verified correct patient position, special equipment/implants available, medications/allergies/relevent history reviewed, required imaging and test results available.  Performed  Maximum sterile technique was used including antiseptics, cap, gloves, gown, hand hygiene, mask and sheet. Skin prep: Chlorhexidine; local anesthetic administered 20 gauge catheter was inserted into left radial artery using the Seldinger technique.  Evaluation Blood flow good; BP tracing good. Complications: No apparent complications.   Koren BoundYACOUB,WESAM 09/25/2014

## 2014-09-25 NOTE — Progress Notes (Signed)
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  PCCM consulted post-op for assistance with shock and vent management.  STUDIES:  4/13 cardiac cath 4/13 Echo >> EF 35%  SIGNIFICANT EVENTS: 4/13 CABG, closure of PFO, impella, VDRF, cardiogenic shock  4/14 Lost pacemaker capture >> Cardiac arrest >> 8 minutes before ROSC >> wide complex tachycardia >> cardioversion; Hb 5 >> transfused PRBC 4/18 A fib with RVR >> amiodarone added 4/19 Oxygen desaturation >> suctioned mucus plug; a fib with RVR >> cardioversion 4/20 Impella out; A fib with RVR >> cardioversion  SUBJECTIVE: RASS 0. + F/C. Remains on multiple vasopressors.  VITAL SIGNS: Temp:  [96.2 F (35.7 C)-98.6 F (37 C)] 97.3 F (36.3 C) (04/25 1144) Pulse Rate:  [62-116] 93 (04/25 1220) Resp:  [19-35] 25 (04/25 1220) BP: (69-108)/(42-81) 82/44 mmHg (04/25 1220) SpO2:  [78 %-100 %] 97 % (04/25 1220) FiO2 (%):  [40 %] 40 % (04/25 1200) Weight:  [74.8 kg (164 lb 14.5 oz)] 74.8 kg (164 lb 14.5 oz) (04/25 0500) HEMODYNAMICS: PAP: (20-31)/(13-24) 31/24 mmHg CVP:  [6 mmHg-14 mmHg] 10 mmHg VENTILATOR SETTINGS: Vent Mode:  [-] PRVC FiO2 (%):  [40 %] 40 % Set Rate:  [16 bmp] 16 bmp Vt Set:  [600 mL] 600 mL PEEP:  [5 cmH20] 5 cmH20 Plateau Pressure:  [19 cmH20-25 cmH20] 19 cmH20 INTAKE / OUTPUT: Intake/Output      04/24 0701 - 04/25 0700 04/25 0701 - 04/26 0700   I.V. (mL/kg) 2277.9 (30.5) 415.2 (5.6)   NG/GT 1160 475   IV Piggyback 550 50   Total Intake(mL/kg) 3987.9 (53.3) 940.2 (12.6)   Urine (mL/kg/hr) 3210 (1.8) 820 (2)   Emesis/NG output 0 (0)    Stool 0 (0)    Chest Tube 40 (0) 0 (0)   Total Output 3250 820   Net +737.9 +120.2        Stool Occurrence 1 x     PHYSICAL EXAMINATION: General: no distress, intubated Neuro: RASS 0, + F/C, MAEs HEENT: WNL Cardiovascular: regular, tachycardic Lungs: scattered faint crackles Abdomen: soft, non tender  Musculoskeletal: 1+ edema Skin: no rashes  LABS:  CBC  Recent Labs Lab 09/23/14 0400 09/24/14 0322 09/24/14 1708 09/25/14 0355  WBC 12.2* 13.1*  --  14.7*  HGB 10.4* 10.9* 12.6* 10.9*  HCT 29.3* 30.7* 37.0* 31.0*  PLT 133* 146*  --  176     Coag's  Recent Labs Lab 09/21/14 0900  INR 1.01    BMET  Recent Labs Lab 09/23/14 0400 09/24/14 0322 09/24/14 1708 09/25/14 0355  NA 132* 133* 135 133*  K 3.3* 3.0* 3.4* 4.2  CL 96 98 98 99  CO2 26 26  --  25  BUN 21 21 24* 27*  CREATININE 1.63* 1.52* 1.50* 1.72*  GLUCOSE 77 98 133* 127*     Electrolytes  Recent Labs Lab 09/19/14 0500  09/23/14 0400 09/24/14 0322 09/25/14 0355  CALCIUM 7.3*  < > 7.7* 7.8* 7.8*  MG 1.7  --   --   --   --   PHOS 3.1  --   --   --   --   < > =  values in this interval not displayed.   Sepsis Markers No results for input(s): LATICACIDVEN, PROCALCITON, O2SATVEN in the last 168 hours. ABG  Recent Labs Lab 09/23/14 0400 09/24/14 0304 09/25/14 0341  PHART 7.467* 7.465* 7.459*  PCO2ART 39.1 38.3 37.5  PO2ART 77.0* 81.2 76.4*   Liver Enzymes  Recent Labs Lab 09/23/14 0400 09/24/14 0322 09/25/14 0355  AST 94* 116* 137*  ALT 39 48 68*  ALKPHOS 161* 203* 339*  BILITOT 14.9* 11.5* 7.7*  ALBUMIN 2.5* 2.0* 2.0*   Cardiac Enzymes No results for input(s): TROPONINI, PROBNP in the last 168 hours.   Glucose  Recent Labs Lab 09/24/14 1640 09/24/14 1952 09/24/14 2350 09/25/14 0413 09/25/14 0803 09/25/14 1201  GLUCAP 115* 143* 158* 121* 131* 133*   Imaging No results found. ASSESSMENT / PLAN:  CARDIOVASCULAR A:  STEMI s/p CABG and PFO closure 4/13. Acute  systolic CHF. Cardiogenic shock. A fib with RVR. Hx of HTN, HLD. P:  - Epi 1. - Dopamine 5. - Norepi 16. - Titrate for SBP of 65. - Empella out.  PULMONARY ETT 4/13 >> A: Prolonged VDRF P:   Continue full vent support given volume status and hemodynamics. Will need to consider tracheostomy, need to discuss with family.  RENAL A:   Hyponatremia, mild Hypokalemia. AKI Cr worsening but UOP remains stable. P:   Monitor BMET intermittently Monitor I/Os Correct electrolytes as indicated Avoid further IVF at this point.  GASTROINTESTINAL A:   Hx of GERD Hyperbilirubinemia P:   Cont SUP Cont TFs as tolerated GI service following for hyperbilirubinemia  HEMATOLOGIC A:   Mild anemia without acute blood loss Thrombocytopenia, improving P:  DVT px: LMWH Monitor CBC intermittently Transfuse per usual ICU guidelines  INFECTIOUS A:   Fever, unclear source P:   Abx per TCTS  ENDOCRINE A:   Hyperglycemia. P:   Cont SSI with levemir  NEUROLOGIC A:   Acute encephalopathy, resolved Hx of Anxiety / Depression. P:   RASS goal 0 Continue current Rx  Prognosis remains very much guarded, time to begin discussion regarding tracheostomy tube placement.  Unfortunately no family present bedside, will need to discuss with the other services involved prior to approaching family.  The patient is critically ill with multiple organ systems failure and requires high complexity decision making for assessment and support, frequent evaluation and titration of therapies, application of advanced monitoring technologies and extensive interpretation of multiple databases.   Critical Care Time devoted to patient care services described in this note is  35  Minutes. This time reflects time of care of this signee Dr Koren Bound. This critical care time does not reflect procedure time, or teaching time or supervisory time of PA/NP/Med student/Med Resident etc but could involve care  discussion time.  Alyson Reedy, M.D. [facility] Pulmonary/Critical Care Medicine. Pager: [phone]. After hours pager: [phone].  09/25/2014, 12:30 PM

## 2014-09-25 NOTE — Progress Notes (Signed)
RT attempted Aline by 2 therapist unsuccessfully. RN aware. CCM MD notified and will place line.

## 2014-09-25 NOTE — Care Management Note (Signed)
    Page 1 of 2   10/05/2014     11:39:58 AM CARE MANAGEMENT NOTE 10/05/2014  Patient:  [name],[name]   Account Number:  [id number]  Date Initiated:  09/19/2014  Documentation initiated by:  Justin ArenasBROWN,Justin Bass  Subjective/Objective Assessment:   Admitted with STEMI  - emergent  CABG with arrest following.     Action/Plan:   Anticipated DC Date:  09/26/2014   Anticipated DC Plan:  LONG TERM ACUTE CARE (LTAC)      DC Planning Services  CM consult      Choice offered to / List presented to:             Status of service:  In process, will continue to follow Medicare Important Message given?  YES (If response is "NO", the following Medicare IM given date fields will be blank) Date Medicare IM given:  09/25/2014 Medicare IM given by:  [facility]Justin Melroy Date Additional Medicare IM given:  10/05/2014 Additional Medicare IM given by:  [facility] Daulton Harbaugh  Discharge Disposition:    Per UR Regulation:  Reviewed for med. necessity/level of care/duration of stay  If discussed at Long Length of Stay Meetings, dates discussed:   09/19/2014  09/21/2014  09/26/2014    Comments:  Contact:  [name],[name] Daughter [phone]     [place],[name] Sister [phone]  [phone]  10-05-14 9am Justin ArenasSarah Josian Knight, RNBSN - [phone] Bilateral pleurix tubes placed for recurrent effusions. Remains on vent - not tolerating weaning.  Off dobutamine that was started on 5-3, but continues on levo - weaning off.  Plan remains for [facility] - prefer [facility] - when ready. CM will continue to follow.  [name] ok with [facility] today per nurse.  [facility] states [facility] bed.  Nurse to notify Dr. VT .  [facility] notified of plan.  10-02-2014  10am Justin ArenasSarah Yolander Knight, RNBSN - [phone] Talked with [name] this am.  Feels patient will be ready for Ltach once off levo - now low dose, maybe later this week.  Called daughter, Justin MortimerWanda, and Discussed progression option of Ltachs for respiratory weaning and rehab.  Agreeable to this transition -  would like [facility] - to stay within hospital if at all possible.  Referral  09-28-14 11:30am Justin ArenasSarah Taheem Knight, RNBSN - [phone] OOB to chair - trialing trach collar.  Remains on amio, dopamine, milrinone, epi and norepi.  Physicians planning for Ltach dc when ready.  09-25-14 9:10am Justin ArenasSarah Danil Knight, RNBSN - [phone] Remians intubated, however weaning.  Remains on amio, dopamine, milrinone, epi and norepi.  Patient is awake. Physician planning to talk to family about trach.  CM will continue to follow.  Would be eligible for an Ltach.  09-20-14 9:50am Justin ArenasSarah Nixxon Knight, RNBSN [phone] Remains intubated. On impella with epi, amio, milrinone and dopamine.  Poor prognosis.

## 2014-09-25 NOTE — Progress Notes (Signed)
Patient ID: [name], male   DOB: 1941/05/26, 74 y.o.   MRN: [id number] EVENING ROUNDS NOTE :     [address]       [place] [id number]             [phone]                 12 Days Post-Op Procedure(s) (LRB): CORONARY ARTERY BYPASS GRAFTING (CABG), ON PUMP, TIMES THREE, USING LEFT INTERNAL MAMMARY ARTERY, LEFT GREATER SAPHENOUS VEIN HARVESTED ENDOSCOPICALLY (N/A) PATENT FORAMEN OVALE CLOSURE  Total Length of Stay:  LOS: 12 days  BP 118/55 mmHg  Pulse 93  Temp(Src) 98.3 F (36.8 C) (Oral)  Resp 24  Ht 5\' 7"  (1.702 m)  Wt 164 lb 14.5 oz (74.8 kg)  BMI 25.82 kg/m2  SpO2 97%  .Intake/Output      04/25 0701 - 04/26 0700   I.V. (mL/kg) 1013.4 (13.5)   NG/GT 935   IV Piggyback 100   Total Intake(mL/kg) 2048.4 (27.4)   Urine (mL/kg/hr) 1295 (1.4)   Emesis/NG output    Stool    Chest Tube 0 (0)   Total Output 1295   Net +753.4         . sodium chloride 10 mL/hr at 09/25/14 1900  . sodium chloride 250 mL (09/22/14 0600)  . amiodarone 30 mg/hr (09/25/14 1900)  . dexmedetomidine 1 mcg/kg/hr (09/25/14 1900)  . DOPamine 5 mcg/kg/min (09/25/14 1900)  . EPINEPHrine 4 mg in dextrose 5% 250 mL infusion (16 mcg/mL) 2 mcg/min (09/25/14 1900)  . lactated ringers Stopped (09/18/14 1204)  . lactated ringers 10 mL/hr at 09/22/14 2257  . midazolam (VERSED) infusion 6 mg/hr (09/25/14 1900)  . milrinone 0.25 mcg/kg/min (09/25/14 1937)  . norepinephrine (LEVOPHED) Adult infusion 17 mcg/min (09/25/14 1900)     Lab Results  Component Value Date   WBC 14.7* 09/25/2014   HGB 10.9* 09/25/2014   HCT 31.0* 09/25/2014   PLT 176 09/25/2014   GLUCOSE 127* 09/25/2014   ALT 68* 09/25/2014   AST 137* 09/25/2014   NA 133* 09/25/2014   K 4.2 09/25/2014   CL 99 09/25/2014   CREATININE 1.72* 09/25/2014   BUN 27* 09/25/2014   CO2 25 09/25/2014   TSH 0.704 09/13/2014   INR 1.01 09/21/2014   HGBA1C 5.3 09/13/2014   More alert, still vent decreasing presssor  support  Delight OvensEdward B Emiliano Welshans MD  Beeper [phone] Office [phone] 09/25/2014 7:40 PM

## 2014-09-26 ENCOUNTER — Inpatient Hospital Stay (HOSPITAL_COMMUNITY): Payer: Medicare Other

## 2014-09-26 ENCOUNTER — Inpatient Hospital Stay (HOSPITAL_COMMUNITY): Payer: Medicare Other | Admitting: Anesthesiology

## 2014-09-26 ENCOUNTER — Encounter (HOSPITAL_COMMUNITY): Payer: Self-pay | Admitting: Certified Registered Nurse Anesthetist

## 2014-09-26 ENCOUNTER — Encounter (HOSPITAL_COMMUNITY)
Admission: EM | Disposition: A | Discharge status: Nursing Home (Includes ICF) | Payer: Self-pay | Source: Home / Self Care | Attending: Cardiothoracic Surgery

## 2014-09-26 DIAGNOSIS — J95822 Acute and chronic postprocedural respiratory failure: Secondary | ICD-10-CM

## 2014-09-26 DIAGNOSIS — I313 Pericardial effusion (noninflammatory): Secondary | ICD-10-CM

## 2014-09-26 HISTORY — PX: TRACHEOSTOMY TUBE PLACEMENT: SHX814

## 2014-09-26 HISTORY — PX: CHEST TUBE INSERTION: SHX231

## 2014-09-26 LAB — GLUCOSE, CAPILLARY
GLUCOSE-CAPILLARY: 115 mg/dL — AB (ref 70–99)
GLUCOSE-CAPILLARY: 79 mg/dL (ref 70–99)
Glucose-Capillary: 131 mg/dL — ABNORMAL HIGH (ref 70–99)
Glucose-Capillary: 118 mg/dL — ABNORMAL HIGH (ref 70–99)
Glucose-Capillary: 146 mg/dL — ABNORMAL HIGH (ref 70–99)
Glucose-Capillary: 89 mg/dL (ref 70–99)

## 2014-09-26 LAB — BLOOD GAS, ARTERIAL
Acid-Base Excess: 3.5 mmol/L — ABNORMAL HIGH (ref 0.0–2.0)
Bicarbonate: 27.1 mEq/L — ABNORMAL HIGH (ref 20.0–24.0)
Drawn by: 39898
FIO2: 40 %
MECHVT: 600 mL
O2 Saturation: 97.2 %
PEEP: 5 cmH2O
Patient temperature: 98.6
RATE: 16 resp/min
TCO2: 28.2 mmol/L (ref 0–100)
pCO2 arterial: 37.7 mmHg (ref 35.0–45.0)
pH, Arterial: 7.47 — ABNORMAL HIGH (ref 7.350–7.450)
pO2, Arterial: 89 mmHg (ref 80.0–100.0)

## 2014-09-26 LAB — CARBOXYHEMOGLOBIN
Carboxyhemoglobin: 1.6 % — ABNORMAL HIGH (ref 0.5–1.5)
METHEMOGLOBIN: 1.1 % (ref 0.0–1.5)
O2 Saturation: 68.6 %
TOTAL HEMOGLOBIN: 10.9 g/dL — AB (ref 13.5–18.0)

## 2014-09-26 LAB — CBC WITH DIFFERENTIAL/PLATELET
Basophils Absolute: 0 10*3/uL (ref 0.0–0.1)
Basophils Relative: 0 % (ref 0–1)
Eosinophils Absolute: 0.1 10*3/uL (ref 0.0–0.7)
Eosinophils Relative: 0 % (ref 0–5)
HCT: 32.6 % — ABNORMAL LOW (ref 39.0–52.0)
Hemoglobin: 11.1 g/dL — ABNORMAL LOW (ref 13.0–17.0)
Lymphocytes Relative: 3 % — ABNORMAL LOW (ref 12–46)
Lymphs Abs: 0.4 10*3/uL — ABNORMAL LOW (ref 0.7–4.0)
MCH: 29.8 pg (ref 26.0–34.0)
MCHC: 34 g/dL (ref 30.0–36.0)
MCV: 87.6 fL (ref 78.0–100.0)
Monocytes Absolute: 1 10*3/uL (ref 0.1–1.0)
Monocytes Relative: 6 % (ref 3–12)
Neutro Abs: 13.3 10*3/uL — ABNORMAL HIGH (ref 1.7–7.7)
Neutrophils Relative %: 91 % — ABNORMAL HIGH (ref 43–77)
Platelets: 202 10*3/uL (ref 150–400)
RBC: 3.72 MIL/uL — ABNORMAL LOW (ref 4.22–5.81)
RDW: 17.9 % — ABNORMAL HIGH (ref 11.5–15.5)
WBC: 14.8 10*3/uL — ABNORMAL HIGH (ref 4.0–10.5)

## 2014-09-26 LAB — COMPREHENSIVE METABOLIC PANEL
ALT: 74 U/L — ABNORMAL HIGH (ref 0–53)
AST: 120 U/L — ABNORMAL HIGH (ref 0–37)
Albumin: 1.8 g/dL — ABNORMAL LOW (ref 3.5–5.2)
Alkaline Phosphatase: 381 U/L — ABNORMAL HIGH (ref 39–117)
Anion gap: 9 (ref 5–15)
BUN: 34 mg/dL — ABNORMAL HIGH (ref 6–23)
CO2: 25 mmol/L (ref 19–32)
Calcium: 7.8 mg/dL — ABNORMAL LOW (ref 8.4–10.5)
Chloride: 98 mmol/L (ref 96–112)
Creatinine, Ser: 1.76 mg/dL — ABNORMAL HIGH (ref 0.50–1.35)
GFR calc Af Amer: 43 mL/min — ABNORMAL LOW (ref >90)
GFR calc non Af Amer: 37 mL/min — ABNORMAL LOW (ref >90)
Glucose, Bld: 139 mg/dL — ABNORMAL HIGH (ref 70–99)
Potassium: 4.5 mmol/L (ref 3.5–5.1)
Sodium: 132 mmol/L — ABNORMAL LOW (ref 135–145)
Total Bilirubin: 5.2 mg/dL — ABNORMAL HIGH (ref 0.3–1.2)
Total Protein: 4.8 g/dL — ABNORMAL LOW (ref 6.0–8.3)

## 2014-09-26 LAB — TYPE AND SCREEN
ABO/RH(D): O POS
Antibody Screen: NEGATIVE
DAT, IgG: NEGATIVE

## 2014-09-26 LAB — PHOSPHORUS: Phosphorus: 2.2 mg/dL — ABNORMAL LOW (ref 2.3–4.6)

## 2014-09-26 LAB — VANCOMYCIN, TROUGH: Vancomycin Tr: 27.2 ug/mL (ref 10.0–20.0)

## 2014-09-26 LAB — MAGNESIUM: Magnesium: 1.6 mg/dL (ref 1.5–2.5)

## 2014-09-26 SURGERY — CREATION, TRACHEOSTOMY
Anesthesia: General | Site: Chest

## 2014-09-26 MED ORDER — FENTANYL CITRATE (PF) 250 MCG/5ML IJ SOLN
INTRAMUSCULAR | Status: AC
  Filled 2014-09-26: qty 5

## 2014-09-26 MED ORDER — ARTIFICIAL TEARS OP OINT
TOPICAL_OINTMENT | OPHTHALMIC | Status: DC | PRN
  Administered 2014-09-26: 1 via OPHTHALMIC

## 2014-09-26 MED ORDER — ACETAZOLAMIDE ORAL SUSPENSION 25 MG/ML
500.0000 mg | Freq: Two times a day (BID) | ORAL | Status: DC
  Filled 2014-09-26 (×3): qty 20

## 2014-09-26 MED ORDER — ONDANSETRON HCL 4 MG/2ML IJ SOLN
INTRAMUSCULAR | Status: AC
  Filled 2014-09-26: qty 2

## 2014-09-26 MED ORDER — PROPOFOL 10 MG/ML IV BOLUS
INTRAVENOUS | Status: AC
  Filled 2014-09-26: qty 20

## 2014-09-26 MED ORDER — ACETAZOLAMIDE 250 MG PO TABS
500.0000 mg | ORAL_TABLET | Freq: Two times a day (BID) | ORAL | Status: DC
  Administered 2014-09-26 – 2014-09-28 (×5): 500 mg
  Filled 2014-09-26 (×6): qty 2

## 2014-09-26 MED ORDER — TORSEMIDE 20 MG PO TABS
40.0000 mg | ORAL_TABLET | Freq: Two times a day (BID) | ORAL | Status: DC
  Administered 2014-09-26 – 2014-09-28 (×5): 40 mg
  Filled 2014-09-26 (×7): qty 2

## 2014-09-26 MED ORDER — ROCURONIUM BROMIDE 100 MG/10ML IV SOLN
INTRAVENOUS | Status: DC | PRN
  Administered 2014-09-26: 20 mg via INTRAVENOUS
  Administered 2014-09-26: 50 mg via INTRAVENOUS

## 2014-09-26 MED ORDER — VANCOMYCIN HCL IN DEXTROSE 750-5 MG/150ML-% IV SOLN
750.0000 mg | INTRAVENOUS | Status: DC
  Administered 2014-09-26 – 2014-09-29 (×4): 750 mg via INTRAVENOUS
  Filled 2014-09-26 (×5): qty 150

## 2014-09-26 MED ORDER — MORPHINE SULFATE 4 MG/ML IJ SOLN: 4.0000 mg | INTRAMUSCULAR | Status: DC | PRN

## 2014-09-26 MED ORDER — LACTATED RINGERS IV SOLN
INTRAVENOUS | Status: DC | PRN
  Administered 2014-09-26: 16:00:00 via INTRAVENOUS

## 2014-09-26 MED ORDER — QUETIAPINE FUMARATE 50 MG PO TABS
50.0000 mg | ORAL_TABLET | Freq: Every day | ORAL | Status: DC
  Administered 2014-09-26 – 2014-10-04 (×9): 50 mg via ORAL
  Filled 2014-09-26 (×12): qty 1

## 2014-09-26 MED ORDER — SODIUM CHLORIDE 0.9 % IV SOLN: INTRAVENOUS | Status: DC | PRN

## 2014-09-26 MED ORDER — MIDAZOLAM HCL 2 MG/2ML IJ SOLN: 2.0000 mg | Freq: Four times a day (QID) | INTRAMUSCULAR | Status: DC | PRN

## 2014-09-26 MED ORDER — ARTIFICIAL TEARS OP OINT
TOPICAL_OINTMENT | OPHTHALMIC | Status: AC
  Filled 2014-09-26: qty 3.5

## 2014-09-26 MED ORDER — ENOXAPARIN SODIUM 30 MG/0.3ML ~~LOC~~ SOLN
30.0000 mg | Freq: Every day | SUBCUTANEOUS | Status: DC
  Administered 2014-09-27 – 2014-10-04 (×8): 30 mg via SUBCUTANEOUS
  Filled 2014-09-26 (×9): qty 0.3

## 2014-09-26 MED ORDER — LIDOCAINE HCL (CARDIAC) 20 MG/ML IV SOLN
INTRAVENOUS | Status: AC
  Filled 2014-09-26: qty 5

## 2014-09-26 MED ORDER — FENTANYL CITRATE (PF) 100 MCG/2ML IJ SOLN
INTRAMUSCULAR | Status: DC | PRN
  Administered 2014-09-26: 150 ug via INTRAVENOUS
  Administered 2014-09-26 (×2): 50 ug via INTRAVENOUS

## 2014-09-26 MED ORDER — 0.9 % SODIUM CHLORIDE (POUR BTL) OPTIME
TOPICAL | Status: DC | PRN
  Administered 2014-09-26: 1000 mL

## 2014-09-26 MED ORDER — MIDAZOLAM HCL 2 MG/2ML IJ SOLN
INTRAMUSCULAR | Status: AC
  Filled 2014-09-26: qty 2

## 2014-09-26 MED ORDER — MIDAZOLAM HCL 5 MG/5ML IJ SOLN
INTRAMUSCULAR | Status: DC | PRN
  Administered 2014-09-26: 2 mg via INTRAVENOUS

## 2014-09-26 MED ORDER — LIDOCAINE-EPINEPHRINE (PF) 1 %-1:200000 IJ SOLN
INTRAMUSCULAR | Status: AC
  Filled 2014-09-26: qty 10

## 2014-09-26 SURGICAL SUPPLY — 44 items
BLADE SURG 10 STRL SS (BLADE) IMPLANT
BLADE SURG 11 STRL SS (BLADE) IMPLANT
CANISTER SUCTION 2500CC (MISCELLANEOUS) ×4 IMPLANT
COVER SURGICAL LIGHT HANDLE (MISCELLANEOUS) ×8 IMPLANT
DRSG AQUACEL AG ADV 3.5X14 (GAUZE/BANDAGES/DRESSINGS) ×4 IMPLANT
ELECT CAUTERY BLADE 6.4 (BLADE) ×4 IMPLANT
ELECT REM PT RETURN 9FT ADLT (ELECTROSURGICAL) ×4
ELECTRODE REM PT RTRN 9FT ADLT (ELECTROSURGICAL) ×2 IMPLANT
GAUZE SPONGE 4X4 16PLY XRAY LF (GAUZE/BANDAGES/DRESSINGS) ×4 IMPLANT
GLOVE BIOGEL PI IND STRL 6.5 (GLOVE) ×2 IMPLANT
GLOVE BIOGEL PI INDICATOR 6.5 (GLOVE) ×2
GLOVE SS BIOGEL STRL SZ 7.5 (GLOVE) ×4 IMPLANT
GLOVE SUPERSENSE BIOGEL SZ 7.5 (GLOVE) ×4
GOWN STRL REUS W/ TWL LRG LVL3 (GOWN DISPOSABLE) ×4 IMPLANT
GOWN STRL REUS W/TWL LRG LVL3 (GOWN DISPOSABLE) ×4
HEMOSTAT SURGICEL 2X14 (HEMOSTASIS) IMPLANT
HOLDER TRACH TUBE VELCRO 19.5 (MISCELLANEOUS) ×4 IMPLANT
KIT BASIN OR (CUSTOM PROCEDURE TRAY) ×4 IMPLANT
KIT ROOM TURNOVER OR (KITS) ×4 IMPLANT
KIT SUCTION CATH 14FR (SUCTIONS) IMPLANT
NEEDLE 22X1 1/2 (OR ONLY) (NEEDLE) IMPLANT
NS IRRIG 1000ML POUR BTL (IV SOLUTION) ×4 IMPLANT
PACK EENT II TURBAN DRAPE (CUSTOM PROCEDURE TRAY) ×4 IMPLANT
PAD ARMBOARD 7.5X6 YLW CONV (MISCELLANEOUS) ×8 IMPLANT
PENCIL BUTTON HOLSTER BLD 10FT (ELECTRODE) ×4 IMPLANT
SPONGE DRAIN TRACH 4X4 STRL 2S (GAUZE/BANDAGES/DRESSINGS) ×4 IMPLANT
SPONGE GAUZE 4X4 12PLY STER LF (GAUZE/BANDAGES/DRESSINGS) ×4 IMPLANT
SUT PROLENE 3 0 RB 1 (SUTURE) IMPLANT
SUT SILK  1 MH (SUTURE) ×4
SUT SILK 1 MH (SUTURE) ×4 IMPLANT
SUT SILK 2 0 SH CR/8 (SUTURE) ×4 IMPLANT
SUT SILK 2 0 TIES 10X30 (SUTURE) ×4 IMPLANT
SUT SILK 3 0 TIES 10X30 (SUTURE) IMPLANT
SYR BULB IRRIGATION 50ML (SYRINGE) ×4 IMPLANT
SYR CONTROL 10ML LL (SYRINGE) IMPLANT
SYRINGE 10CC LL (SYRINGE) ×4 IMPLANT
TAPE CLOTH SURG 4X10 WHT LF (GAUZE/BANDAGES/DRESSINGS) ×4 IMPLANT
TOWEL OR 17X24 6PK STRL BLUE (TOWEL DISPOSABLE) ×4 IMPLANT
TOWEL OR 17X26 10 PK STRL BLUE (TOWEL DISPOSABLE) ×4 IMPLANT
TUBE CONNECTING 12'X1/4 (SUCTIONS) ×1
TUBE CONNECTING 12X1/4 (SUCTIONS) ×3 IMPLANT
TUBE TRACH EXLNGT  7 SNGL (TUBING) ×2
TUBE TRACH EXLNGT 7 SNGL (TUBING) ×2 IMPLANT
WATER STERILE IRR 1000ML POUR (IV SOLUTION) ×4 IMPLANT

## 2014-09-26 NOTE — Progress Notes (Signed)
Left radial aline dislodged while turning pt due to dressing loose due to pt third spacing and weeping.  Called RT to place in aline due to pt requiring multiple pressor support.  Will continue to monitor.

## 2014-09-26 NOTE — Progress Notes (Signed)
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  PCCM consulted post-op for assistance with shock and vent management.  STUDIES:  4/13 cardiac cath 4/13 Echo >> EF 35%  SIGNIFICANT EVENTS: 4/13 CABG, closure of PFO, impella, VDRF, cardiogenic shock  4/14 Lost pacemaker capture >> Cardiac arrest >> 8 minutes before ROSC >> wide complex tachycardia >> cardioversion; Hb 5 >> transfused PRBC 4/18 A fib with RVR >> amiodarone added 4/19 Oxygen desaturation >> suctioned mucus plug; a fib with RVR >> cardioversion 4/20 Impella out; A fib with RVR >> cardioversion 4/26 for trach  SUBJECTIVE:  RASS -2.  Remains on multiple vasopressors.  VITAL SIGNS: Temp:  [96.8 F (36 C)-98.3 F (36.8 C)] 97.3 F (36.3 C) (04/26 0821) Pulse Rate:  [62-113] 84 (04/26 1000) Resp:  [18-33] 19 (04/26 1000) BP: (69-118)/(34-81) 106/54 mmHg (04/26 1000) SpO2:  [96 %-100 %] 98 % (04/26 1000) FiO2 (%):  [40 %] 40 % (04/26 0800) Weight:  [75.5 kg (166 lb 7.2 oz)] 75.5 kg (166 lb 7.2 oz) (04/26 0500) HEMODYNAMICS: CVP:  [10 mmHg] 10 mmHg VENTILATOR SETTINGS: Vent Mode:  [-] PRVC FiO2 (%):  [40 %] 40 % Set Rate:  [16 bmp] 16 bmp Vt Set:  [600 mL] 600 mL PEEP:  [5 cmH20] 5 cmH20 Plateau Pressure:  [19 cmH20-27 cmH20] 25 cmH20 INTAKE / OUTPUT: Intake/Output      04/25 0701 - 04/26 0700 04/26 0701 - 04/27 0700   I.V. (mL/kg) 2044.2 (27.1) 337.7 (4.5)   NG/GT 1215    IV Piggyback 300 50   Total Intake(mL/kg) 3559.2 (47.1) 387.7 (5.1)   Urine (mL/kg/hr) 2395 (1.3) 275 (0.8)   Emesis/NG output     Stool 0 (0)    Chest Tube 0 (0)     Total Output 2395 275   Net +1164.2 +112.7        Stool Occurrence 1 x     PHYSICAL EXAMINATION: General: no distress, intubated Neuro: RASS -2 HEENT: WNL Cardiovascular: regular, tachycardic Lungs: scattered faint crackles Abdomen: soft, non tender  Musculoskeletal: 1+ edema Skin: no rashes  LABS:  CBC  Recent Labs Lab 09/24/14 0322 09/24/14 1708 09/25/14 0355 09/26/14 0410  WBC 13.1*  --  14.7* 14.8*  HGB 10.9* 12.6* 10.9* 11.1*  HCT 30.7* 37.0* 31.0* 32.6*  PLT 146*  --  176 202     Coag's  Recent Labs Lab 09/21/14 0900  INR 1.01    BMET  Recent Labs Lab 09/24/14 0322 09/24/14 1708 09/25/14 0355 09/26/14 0410  NA 133* 135 133* 132*  K 3.0* 3.4* 4.2 4.5  CL 98 98 99 98  CO2 26  --  25 25  BUN 21 24* 27* 34*  CREATININE 1.52* 1.50* 1.72* 1.76*  GLUCOSE 98 133* 127* 139*     Electrolytes  Recent Labs Lab 09/24/14 0322 09/25/14 0355 09/26/14 0410  CALCIUM 7.8* 7.8* 7.8*  MG  --   --  1.6  PHOS  --   --  2.2*     Sepsis Markers No results for input(s): LATICACIDVEN, PROCALCITON, O2SATVEN in the last 168 hours. ABG  Recent Labs Lab 09/24/14 0304 09/25/14 0341 09/26/14 0425  PHART 7.465* 7.459* 7.470*  PCO2ART 38.3 37.5 37.7  PO2ART 81.2 76.4* 89.0   Liver Enzymes  Recent Labs Lab 09/24/14 0322 09/25/14 0355 09/26/14 0410  AST 116* 137* 120*  ALT 48 68* 74*  ALKPHOS 203* 339* 381*  BILITOT 11.5* 7.7* 5.2*  ALBUMIN 2.0* 2.0* 1.8*   Cardiac Enzymes No results for input(s): TROPONINI, PROBNP in the last 168 hours.   Glucose  Recent Labs Lab 09/25/14 1201 09/25/14 1652 09/25/14 2003 09/25/14 2346 09/26/14 0333 09/26/14 0815  GLUCAP 133* 142* 111* 115* 131* 118*   Imaging Dg Chest Port 1 View  09/25/2014   CLINICAL DATA:  Chest tubes.  EXAM: PORTABLE CHEST - 1 VIEW  COMPARISON:  September 23, 2014.  FINDINGS: Mild bilateral pleural effusions are again noted and stable. Right subclavian catheter is noted with distal tip  overlying expected position of SVC. Status post coronary artery bypass graft. Nasogastric and feeding tube are seen entering stomach. Endotracheal tube is not clearly visualized and potentially has been removed.  IMPRESSION: Left-sided chest tube is noted without pneumothorax. Stable bibasilar opacities are noted most likely due to combination of subsegmental atelectasis and mild pleural effusions.   Electronically Signed   By: Lupita Raider, M.D.   On: 09/25/2014 08:03   ASSESSMENT / PLAN:  CARDIOVASCULAR A:  STEMI s/p CABG and PFO closure 4/13. Acute systolic CHF. Cardiogenic shock. A fib with RVR. Hx of HTN, HLD. P:  - Epi 2 - Dopamine 5. - Norepi 17 - Titrate for SBP of 65. - Diuresis per HF team - TCTS following  PULMONARY ETT 4/13 >> A: Prolonged VDRF P:   Continue full vent support given volume status and hemodynamics. Follow CXR, ABG For trach 4/26  RENAL A:   Hyponatremia, mild Hypokalemia. AKI Cr worsening but UOP remains stable. P:   Monitor BMET intermittently Monitor I/Os Correct electrolytes as indicated KVO  GASTROINTESTINAL A:   Hx of GERD Hyperbilirubinemia P:   Cont SUP Holding TF for trach GI service following for hyperbilirubinemia  HEMATOLOGIC A:   Mild anemia without acute blood loss Thrombocytopenia, improving P:  DVT px: LMWH Monitor CBC intermittently Transfuse per usual ICU guidelines  INFECTIOUS A:   Fever, unclear source P:   Abx per TCTS PCT  ENDOCRINE A:   Hyperglycemia. P:   Cont SSI with levemir  NEUROLOGIC A:   Acute encephalopathy, resolved Hx of Anxiety / Depression. P:   RASS goal 0 Precedex gtt for sedation  Continue current Rx   Joneen Roach, AGACNP-BC [facility]/Critical Care Pager [phone] or [phone]  09/26/2014 11:50 AM  VDRF post CABG and worsening pulmonary edema.  Severe cardiogenic shock and pressor demand increased.  Going for a tracheostomy placement today per  CVTS in the OR.  Will continue support for now pending family discussion.  Diffuse crackles remains.  Will hold weaning until patient's hemodynamics improve and tracheostomy is in place.  Will re-evaluate in AM.  The patient is critically ill with multiple organ systems failure and requires high complexity decision making for assessment and support, frequent evaluation and titration of therapies, application of advanced monitoring technologies and extensive interpretation of multiple databases.   Critical Care Time devoted to patient care services described in this note is  35  Minutes. This time reflects time of care of this signee Dr Koren Bound. This critical care time does not reflect procedure time, or teaching time or supervisory  time of PA/NP/Med student/Med Resident etc but could involve care discussion time.  Rush Farmer, M.D. [facility] Pulmonary/Critical Care Medicine. Pager: [phone]. After hours pager: [phone].

## 2014-09-26 NOTE — Progress Notes (Signed)
13 Days Post-Op Procedure(s) (LRB): CORONARY ARTERY BYPASS GRAFTING (CABG), ON PUMP, TIMES THREE, USING LEFT INTERNAL MAMMARY ARTERY, LEFT GREATER SAPHENOUS VEIN HARVESTED ENDOSCOPICALLY (N/A) PATENT FORAMEN OVALE CLOSURE Subjective: Slowly weaning norepi nsr failed vent wean so trach planned Left pleural effusion on CXR- will place new tube  Objective: Vital signs in last 24 hours: Temp:  [96.8 F (36 C)-98.3 F (36.8 C)] 97.3 F (36.3 C) (04/26 1248) Pulse Rate:  [78-93] 87 (04/26 1500) Cardiac Rhythm:  [-] Normal sinus rhythm;Atrial fibrillation (04/26 0800) Resp:  [18-29] 27 (04/26 1500) BP: (86-118)/(43-69) 94/55 mmHg (04/26 1500) SpO2:  [96 %-100 %] 99 % (04/26 1500) FiO2 (%):  [40 %] 40 % (04/26 1224) Weight:  [166 lb 7.2 oz (75.5 kg)] 166 lb 7.2 oz (75.5 kg) (04/26 0500)  Hemodynamic parameters for last 24 hours:  afebrile  Intake/Output from previous day: 04/25 0701 - 04/26 0700 In: 3559.2 [I.V.:2044.2; NG/GT:1215; IV Piggyback:300] Out: 2395 [Urine:2395] Intake/Output this shift: Total I/O In: 1021 [I.V.:971; IV Piggyback:50] Out: 1125 [Urine:1125]  Coarse bs Neuro intact  Lab Results:  Recent Labs  09/25/14 0355 09/26/14 0410  WBC 14.7* 14.8*  HGB 10.9* 11.1*  HCT 31.0* 32.6*  PLT 176 202   BMET:  Recent Labs  09/25/14 0355 09/26/14 0410  NA 133* 132*  K 4.2 4.5  CL 99 98  CO2 25 25  GLUCOSE 127* 139*  BUN 27* 34*  CREATININE 1.72* 1.76*  CALCIUM 7.8* 7.8*    PT/INR: No results for input(s): LABPROT, INR in the last 72 hours. ABG    Component Value Date/Time   PHART 7.470* 09/26/2014 0425   HCO3 27.1* 09/26/2014 0425   TCO2 28.2 09/26/2014 0425   ACIDBASEDEF 2.0 09/19/2014 1208   O2SAT 68.6 09/26/2014 0427   CBG (last 3)   Recent Labs  09/26/14 0333 09/26/14 0815 09/26/14 1232  GLUCAP 131* 118* 146*    Assessment/Plan: S/P Procedure(s) (LRB): CORONARY ARTERY BYPASS GRAFTING (CABG), ON PUMP, TIMES THREE, USING LEFT  INTERNAL MAMMARY ARTERY, LEFT GREATER SAPHENOUS VEIN HARVESTED ENDOSCOPICALLY (N/A) PATENT FORAMEN OVALE CLOSURE Diuresis  Trach Wean inotropes Drain L pleural effusion   LOS: 13 days    Kathlee Nationseter Van Trigt III 09/26/2014

## 2014-09-26 NOTE — Progress Notes (Signed)
[facility] BRIEF SICU PROGRESS NOTE  Day of Surgery  S/P Procedure(s) (LRB): TRACHEOSTOMY (N/A) CHEST TUBE INSERTION (Left)   Stable after return from OR NSR w/ reasonably stable hemodynamics although still on high dose pressors UOP fairly good  Plan: Continue current plan  [name] 09/26/2014 8:04 PM

## 2014-09-26 NOTE — Progress Notes (Signed)
CRITICAL VALUE ALERT  Critical value received:  Vacnomycin trough  Date of notification:  09/26/14  Time of notification: 0047  Critical value read back:Yes.    Nurse who received alert:  Okey DupreJennifer Shilynn Hoch, RN  Notified Windell Mouldinguth, PharmD.

## 2014-09-26 NOTE — Progress Notes (Signed)
The patient was examined and preop studies reviewed. There has been no change from the prior exam and the patient is ready for surgery.  Plan tracheostomy on D Halk today for chronic vent dependent respiratory failure

## 2014-09-26 NOTE — Anesthesia Procedure Notes (Signed)
Date/Time: 09/26/2014 3:55 PM Performed by: Demetrio LappingSMITH, Amariyana Heacox P Pre-anesthesia Checklist: Patient identified, Timeout performed, Emergency Drugs available, Patient being monitored and Suction available Patient Re-evaluated:Patient Re-evaluated prior to inductionOxygen Delivery Method: Circle system utilized Preoxygenation: Pre-oxygenation with 100% oxygen Intubation Type: Inhalational induction

## 2014-09-26 NOTE — Anesthesia Postprocedure Evaluation (Signed)
  Anesthesia Post-op Note  Patient: [name]  Procedure(s) Performed: Procedure(s): TRACHEOSTOMY (N/A) CHEST TUBE INSERTION (Left)  Patient Location: SICU  Anesthesia Type:General  Level of Consciousness: sedated and Patient remains intubated per anesthesia plan  Airway and Oxygen Therapy: Patient remains intubated per anesthesia plan and Patient placed on Ventilator (see vital sign flow sheet for setting)  Post-op Pain: none  Post-op Assessment: Post-op Vital signs reviewed  Post-op Vital Signs: Reviewed  Last Vitals:  Filed Vitals:   09/26/14 2000  BP: 108/55  Pulse: 84  Temp:   Resp: 17    Complications: No apparent anesthesia complications

## 2014-09-26 NOTE — Transfer of Care (Signed)
Immediate Anesthesia Transfer of Care Note  Patient: [name]  Procedure(s) Performed: Procedure(s): TRACHEOSTOMY (N/A) CHEST TUBE INSERTION (Left)  Patient Location: ICU  Anesthesia Type:General  Level of Consciousness: sedated, unresponsive and Patient remains intubated per anesthesia plan (per trach)  Airway & Oxygen Therapy: Patient remains intubated per anesthesia plan and Patient placed on Ventilator (see vital sign flow sheet for setting) (per trach)  Post-op Assessment: Report given to RN and Post -op Vital signs reviewed and stable  Post vital signs: Reviewed and stable  Last Vitals:  BP 96/49 (60) HR 88  SpO2 98 on 40% FiO2  Complications: No apparent anesthesia complications

## 2014-09-26 NOTE — Progress Notes (Addendum)
NUTRITION FOLLOW UP  INTERVENTION:  Continue Vital 1.5 formula at 50 ml/hr  Recommend adding Prostat liquid protein 30 ml BID via tube  Total TF regimen to provide 2000 kcals, 111 gm protein, 917 ml of free water RD to follow for nutrition care plan  NUTRITION DIAGNOSIS: Inadequate oral intake related to inability to eat as evidenced by NPO status, ongoing  Goal: Pt to meet >/= 90% of their estimated nutrition needs, currently unmet  Monitor:  TF regimen & tolerance, respiratory status, weight, labs, I/O's  ASSESSMENT: 74 y/o Male with h/o HTN, HLD and tobacco use presents with epigastric pain that began about 11 pm ad has been continuous. This was associated with SOB and nausea. Symptoms initially were intermittent.He has been getting tired easily and has smoked a lot for a long time. In the ED, ECG revealed SR with ST elevation in aVR, aVL, V1-2 with diffuse deep ST depressions inferior and anterolateral leads.  Patient s/p procedures 4/13: CORONARY ARTERY BYPASS GRAFTING (CABG), ON PUMP, TIMES THREE, USING LEFT INTERNAL MAMMARY ARTERY, LEFT GREATER SAPHENOUS VEIN HARVESTED ENDOSCOPICALLY  PATENT FORAMEN OVALE CLOSURE  Patient is currently intubated on ventilator support MV: 14.0 L/min Temp (24hrs), Avg:97.4 F (36.3 C), Min:96.8 F (36 C), Max:98.3 F (36.8 C)   Vital 1.5 formula currently off.  Going to OR for trach.  Previously infusing at 50 ml/hr via small bore feeding tube (tip over stomach) providing 1800 kcals, 81 gm protein, 917 ml of free water.  Height: Ht Readings from Last 1 Encounters:  09/16/14 5\' 7"  (1.702 m)    Weight: -----> stable Wt Readings from Last 1 Encounters:  09/26/14 166 lb 7.2 oz (75.5 kg)    4/25  164 lb 4/24  162 lb 4/23  161 lb 4/22  164 lb 4/21  166 lb 4/20  163 lb 4/19  167 lb 4/18  167 lb 4/17  158 lb 4/16  166 lb  BMI:  Body mass index is 26.06 kg/(m^2).  Re-estimated Nutritional Needs: Kcal:  1800-2000 Protein: 110-120 gm Fluid: per MD  Skin: surgical chest, leg incisions   Diet Order: Diet NPO time specified Except for: Sips with Meds   Intake/Output Summary (Last 24 hours) at 09/26/14 1533 Last data filed at 09/26/14 1500  Gross per 24 hour  Intake 3235.4 ml  Output   2475 ml  Net  760.4 ml   Labs:   Recent Labs Lab 09/24/14 0322 09/24/14 1708 09/25/14 0355 09/26/14 0410  NA 133* 135 133* 132*  K 3.0* 3.4* 4.2 4.5  CL 98 98 99 98  CO2 26  --  25 25  BUN 21 24* 27* 34*  CREATININE 1.52* 1.50* 1.72* 1.76*  CALCIUM 7.8*  --  7.8* 7.8*  MG  --   --   --  1.6  PHOS  --   --   --  2.2*  GLUCOSE 98 133* 127* 139*    CBG (last 3)   Recent Labs  09/26/14 0333 09/26/14 0815 09/26/14 1232  GLUCAP 131* 118* 146*    Scheduled Meds: . acetaZOLAMIDE  500 mg Per Tube BID  . amiodarone  150 mg Intravenous Once  . antiseptic oral rinse  7 mL Mouth Rinse QID  . aspirin EC  325 mg Oral Daily   Or  . aspirin  324 mg Per Tube Daily  . atorvastatin  40 mg Oral q1800  . bisacodyl  10 mg Oral Daily   Or  . bisacodyl  10  mg Rectal Daily  . budesonide (PULMICORT) nebulizer solution  0.5 mg Nebulization BID  . cefTAZidime (FORTAZ)  IV  1 g Intravenous Q8H  . chlorhexidine  15 mL Mouth Rinse BID  . digoxin  0.125 mg Per Tube QODAY  . enoxaparin (LOVENOX) injection  40 mg Subcutaneous Daily  . feeding supplement (VITAL 1.5 CAL)  1,000 mL Per Tube Q24H  . insulin aspart  0-9 Units Subcutaneous 6 times per day  . insulin detemir  15 Units Subcutaneous BID  . lidocaine-prilocaine  1 application Topical Once  . midodrine  7.5 mg Oral TID WC  . oxymetazoline  2 spray Each Nare Once  . pantoprazole sodium  40 mg Per Tube Daily  . potassium chloride  40 mEq Oral BID  . QUEtiapine  25 mg Oral QHS  . sodium chloride  10-40 mL Intracatheter Q12H  . torsemide  40 mg Per Tube BID  . vancomycin  750 mg Intravenous Q24H    Continuous Infusions: . sodium chloride 50  mL/hr (09/26/14 1300)  . sodium chloride 250 mL (09/22/14 0600)  . amiodarone 30 mg/hr (09/26/14 1200)  . dexmedetomidine 1 mcg/kg/hr (09/26/14 1522)  . DOPamine 5 mcg/kg/min (09/26/14 1200)  . EPINEPHrine 4 mg in dextrose 5% 250 mL infusion (16 mcg/mL) 2 mcg/min (09/26/14 1200)  . lactated ringers Stopped (09/18/14 1204)  . lactated ringers 10 mL/hr at 09/22/14 2257  . midazolam (VERSED) infusion 6 mg/hr (09/26/14 1200)  . milrinone 0.25 mcg/kg/min (09/26/14 1300)  . norepinephrine (LEVOPHED) Adult infusion 19 mcg/min (09/26/14 1300)    Past Medical History  Diagnosis Date  . Dyspnea   . Palpitations   . Anxiety and depression   . GERD (gastroesophageal reflux disease)   . Hypertension   . Hypercholesteremia     Past Surgical History  Procedure Laterality Date  . Appendectomy    . Cataract extraction w/phaco  05/06/2012    Procedure: CATARACT EXTRACTION PHACO AND INTRAOCULAR LENS PLACEMENT (IOC);  Surgeon: Gemma Payor, MD;  Location: [facility];  Service: Ophthalmology;  Laterality: Right;  CDE:22.64  . Cataract extraction w/phaco  05/24/2012    Procedure: CATARACT EXTRACTION PHACO AND INTRAOCULAR LENS PLACEMENT (IOC);  Surgeon: Gemma Payor, MD;  Location: [facility];  Service: Ophthalmology;  Laterality: Left;  CDE: 27.08  . Cardiac catheterization  09/13/2014    Procedure: VENTRICULAR ASSIST DEVICE INSERTION;  Surgeon: Corky Crafts, MD;  Location: [facility] CATH LAB;  Service: Cardiovascular;;  . Cardiac catheterization  09/13/2014    Procedure: CORONARY BALLOON ANGIOPLASTY;  Surgeon: Corky Crafts, MD;  Location: [facility] CATH LAB;  Service: Cardiovascular;;  . Coronary angiogram  09/13/2014    Procedure: CORONARY ANGIOGRAM;  Surgeon: Corky Crafts, MD;  Location: [facility] CATH LAB;  Service: Cardiovascular;;  . Coronary artery bypass graft N/A 09/13/2014    Procedure: CORONARY ARTERY BYPASS GRAFTING (CABG), ON PUMP, TIMES THREE, USING LEFT INTERNAL MAMMARY ARTERY, LEFT GREATER SAPHENOUS  VEIN HARVESTED ENDOSCOPICALLY;  Surgeon: Kerin Perna, MD;  Location: [facility] OR;  Service: Open Heart Surgery;  Laterality: N/A;  . Patent foramen ovale closure  09/13/2014    Procedure: PATENT FORAMEN OVALE CLOSURE;  Surgeon: Kerin Perna, MD;  Location: [facility] OR;  Service: Open Heart Surgery;;    Maureen Chatters, RD, LDN Pager #: [phone] After-Hours Pager #: [phone]

## 2014-09-26 NOTE — Anesthesia Preprocedure Evaluation (Signed)
Anesthesia Evaluation  Patient identified by MRN, date of birth, ID band Patient awake  General Assessment Comment:Remained intubated since CABG 4/13  Reviewed: Allergy & Precautions, NPO status , Patient's Chart, lab work & pertinent test results  Airway Mallampati: Intubated  TM Distance: >3 FB Neck ROM: Full    Dental no notable dental hx.    Pulmonary neg pulmonary ROS, Current Smoker,  breath sounds clear to auscultation+ rhonchi         Cardiovascular hypertension, + Past MI and + CABG Rhythm:Regular Rate:Normal     Neuro/Psych negative neurological ROS  negative psych ROS   GI/Hepatic negative GI ROS, Neg liver ROS,   Endo/Other  negative endocrine ROS  Renal/GU negative Renal ROS  negative genitourinary   Musculoskeletal negative musculoskeletal ROS (+)   Abdominal   Peds negative pediatric ROS (+)  Hematology negative hematology ROS (+)   Anesthesia Other Findings   Reproductive/Obstetrics negative OB ROS                             Anesthesia Physical Anesthesia Plan  ASA: IV  Anesthesia Plan: General   Post-op Pain Management:    Induction: Inhalational  Airway Management Planned: Oral ETT  Additional Equipment:   Intra-op Plan:   Post-operative Plan: Post-operative intubation/ventilation  Informed Consent: I have reviewed the patients History and Physical, chart, labs and discussed the procedure including the risks, benefits and alternatives for the proposed anesthesia with the patient or authorized representative who has indicated his/her understanding and acceptance.   Dental advisory given  Plan Discussed with: CRNA and Surgeon  Anesthesia Plan Comments:         Anesthesia Quick Evaluation

## 2014-09-26 NOTE — Progress Notes (Signed)
ANTIBIOTIC CONSULT NOTE - FOLLOW UP  Pharmacy Consult for vancomycin Indication: rule out pneumonia   Labs:  Recent Labs  09/23/14 0400 09/24/14 0322 09/24/14 1708 09/25/14 0355  WBC 12.2* 13.1*  --  14.7*  HGB 10.4* 10.9* 12.6* 10.9*  PLT 133* 146*  --  176  CREATININE 1.63* 1.52* 1.50* 1.72*   Estimated Creatinine Clearance: 36.8 mL/min (by C-G formula based on Cr of 1.72).  Recent Labs  09/25/14 2200  VANCOTROUGH 27.2*     Microbiology: Recent Results (from the past 720 hour(s))  MRSA PCR Screening     Status: None   Collection Time: 09/13/14  4:51 PM  Result Value Ref Range Status   MRSA by PCR NEGATIVE NEGATIVE Final    Comment:        The GeneXpert MRSA Assay (FDA approved for NASAL specimens only), is one component of a comprehensive MRSA colonization surveillance program. It is not intended to diagnose MRSA infection nor to guide or monitor treatment for MRSA infections.   Culture, respiratory (NON-Expectorated)     Status: None   Collection Time: 09/21/14  6:48 AM  Result Value Ref Range Status   Specimen Description TRACHEAL ASPIRATE  Final   Special Requests Normal  Final   Gram Stain   Final    MODERATE WBC PRESENT, PREDOMINANTLY PMN RARE SQUAMOUS EPITHELIAL CELLS PRESENT NO ORGANISMS SEEN Performed at [facility]    Culture   Final    FEW YEAST CONSISTENT WITH CANDIDA SPECIES Performed at [facility]    Report Status 09/23/2014 FINAL  Final     Assessment: 74yo male supratherapeutic on vancomycin with initial dosing for possible PNA.  Goal of Therapy:  Vancomycin trough level 15-20 mcg/ml  Plan:  Will change vanc to 750mg  IV Q24H for calculated trough ~19 and continue to monitor.  Vernard GamblesVeronda Camauri Fleece, PharmD, BCPS  09/26/2014,1:04 AM

## 2014-09-26 NOTE — Progress Notes (Signed)
Advanced Heart Failure Rounding Note   Subjective:    Remains intubated. Awake  Impella removed 4/20. Swan out 4/24  Lasix held yesterday due to rising creatinine. Weight up again. Creatinine stable at 1.7. Midodrine increased to 7.5 tid. Inotropes stable. Levophed (17) and EPI (2). Co-ox 69%. CVP 9  Maintaining SR on amio   Failed vent wean again yesterday. For trach  today.   Bili coming down    Objective:   Weight Range:  Vital Signs:   Temp:  [96.8 F (36 C)-98.3 F (36.8 C)] 97.3 F (36.3 C) (04/26 0821) Pulse Rate:  [62-113] 81 (04/26 0800) Resp:  [18-35] 20 (04/26 0800) BP: (69-118)/(34-81) 101/54 mmHg (04/26 0800) SpO2:  [95 %-100 %] 98 % (04/26 0800) FiO2 (%):  [40 %] 40 % (04/26 0800) Weight:  [75.5 kg (166 lb 7.2 oz)] 75.5 kg (166 lb 7.2 oz) (04/26 0500)    Weight change: Filed Weights   09/24/14 0500 09/25/14 0500 09/26/14 0500  Weight: 73.5 kg (162 lb 0.6 oz) 74.8 kg (164 lb 14.5 oz) 75.5 kg (166 lb 7.2 oz)    Intake/Output:   Intake/Output Summary (Last 24 hours) at 09/26/14 0856 Last data filed at 09/26/14 0800  Gross per 24 hour  Intake 3433.24 ml  Output   2175 ml  Net 1258.24 ml     Physical Exam: General:  Intubated More awake HEENT: ETT. Scleral icterus.  Neck: supple.Carotids 2+ bilat; no bruits. No lymphadenopathy or thryomegaly appreciated. Cor: Sternal dressing. +CTs PMI nondisplaced. Regular rate & rhythm. Lungs: clear Abdomen: soft, nontender, + distended. No hepatosplenomegaly. No bruits or masses. Hypoactive bowel sounds. Extremities: no cyanosis, clubbing, rash, 3+ edema Neuro: alert & orientedx3, cranial nerves grossly intact. moves all 4 extremities w/o difficulty. Affect pleasant  Telemetry: NSR  Labs: Basic Metabolic Panel:  Recent Labs Lab 09/22/14 0230  09/23/14 0400 09/24/14 0322 09/24/14 1708 09/25/14 0355 09/26/14 0410  NA 131*  < > 132* 133* 135 133* 132*  K 3.5  < > 3.3* 3.0* 3.4* 4.2 4.5  CL 94*  < >  96 98 98 99 98  CO2 27  --  26 26  --  25 25  GLUCOSE 111*  < > 77 98 133* 127* 139*  BUN 19  < > 21 21 24* 27* 34*  CREATININE 1.68*  < > 1.63* 1.52* 1.50* 1.72* 1.76*  CALCIUM 7.0*  --  7.7* 7.8*  --  7.8* 7.8*  MG  --   --   --   --   --   --  1.6  PHOS  --   --   --   --   --   --  2.2*  < > = values in this interval not displayed.  Liver Function Tests:  Recent Labs Lab 09/22/14 0230 09/22/14 1145 09/23/14 0400 09/24/14 0322 09/25/14 0355 09/26/14 0410  AST 75*  --  94* 116* 137* 120*  ALT 35  --  39 48 68* 74*  ALKPHOS 181*  --  161* 203* 339* 381*  BILITOT 16.2* 15.8* 14.9* 11.5* 7.7* 5.2*  PROT 4.9*  --  4.8* 4.7* 4.8* 4.8*  ALBUMIN 2.1*  --  2.5* 2.0* 2.0* 1.8*   No results for input(s): LIPASE, AMYLASE in the last 168 hours. No results for input(s): AMMONIA in the last 168 hours.  CBC:  Recent Labs Lab 09/22/14 0230  09/23/14 0400 09/24/14 0322 09/24/14 1708 09/25/14 0355 09/26/14 0410  WBC 12.6*  --  12.2* 13.1*  --  14.7* 14.8*  NEUTROABS 11.0*  --  11.1* 12.0*  --  13.5* 13.3*  HGB 11.1*  < > 10.4* 10.9* 12.6* 10.9* 11.1*  HCT 30.9*  < > 29.3* 30.7* 37.0* 31.0* 32.6*  MCV 85.4  --  83.0 85.3  --  86.4 87.6  PLT 111*  --  133* 146*  --  176 202  < > = values in this interval not displayed.  Cardiac Enzymes: No results for input(s): CKTOTAL, CKMB, CKMBINDEX, TROPONINI in the last 168 hours.  BNP: BNP (last 3 results)  Recent Labs  09/13/14 1310  BNP 96.8    ProBNP (last 3 results) No results for input(s): PROBNP in the last 8760 hours.    Other results:  Imaging: Dg Chest Port 1 View  09/26/2014   CLINICAL DATA:  74 year old male with dyspnea. For tracheostomy today. Subsequent encounter.  EXAM: PORTABLE CHEST - 1 VIEW  COMPARISON:  09/25/2014.  FINDINGS: Endotracheal tube tip 5.4 cm above the carina.  Right central line tip mid to distal superior vena cava.  Nasogastric tube courses below the diaphragm. Tip is not included on the  present exam.  Left-sided chest tube is in place.  No gross pneumothorax.  Post CABG.  Heart size within normal limits.  Hazy opacification mid lower lung zones may represent posteriorly layering pleural effusion although basilar infiltrate difficult to completely exclude in the proper clinical setting. Appearance is unchanged.  Pulmonary vascular congestion/ mild pulmonary edema unchanged.  Epicardial leads in place.  Calcified aorta  IMPRESSION: Pulmonary vascular congestion/mild pulmonary edema with posteriorly layering pleural effusions suspected, unchanged from prior exam as noted above.   Electronically Signed   By: Lacy DuverneySteven  Olson M.D.   On: 09/26/2014 08:09   Dg Chest Port 1 View  09/25/2014   CLINICAL DATA:  Chest tubes.  EXAM: PORTABLE CHEST - 1 VIEW  COMPARISON:  September 23, 2014.  FINDINGS: Mild bilateral pleural effusions are again noted and stable. Right subclavian catheter is noted with distal tip overlying expected position of SVC. Status post coronary artery bypass graft. Nasogastric and feeding tube are seen entering stomach. Endotracheal tube is not clearly visualized and potentially has been removed.  IMPRESSION: Left-sided chest tube is noted without pneumothorax. Stable bibasilar opacities are noted most likely due to combination of subsegmental atelectasis and mild pleural effusions.   Electronically Signed   By: Lupita RaiderJames  Green Jr, M.D.   On: 09/25/2014 08:03     Medications:     Scheduled Medications: . amiodarone  150 mg Intravenous Once  . antiseptic oral rinse  7 mL Mouth Rinse QID  . aspirin EC  325 mg Oral Daily   Or  . aspirin  324 mg Per Tube Daily  . atorvastatin  40 mg Oral q1800  . bisacodyl  10 mg Oral Daily   Or  . bisacodyl  10 mg Rectal Daily  . budesonide (PULMICORT) nebulizer solution  0.5 mg Nebulization BID  . cefTAZidime (FORTAZ)  IV  1 g Intravenous Q8H  . chlorhexidine  15 mL Mouth Rinse BID  . digoxin  0.125 mg Per Tube QODAY  . enoxaparin (LOVENOX)  injection  40 mg Subcutaneous Daily  . feeding supplement (VITAL 1.5 CAL)  1,000 mL Per Tube Q24H  . insulin aspart  0-9 Units Subcutaneous 6 times per day  . insulin detemir  15 Units Subcutaneous BID  . lidocaine-prilocaine  1 application Topical Once  . midodrine  7.5 mg Oral  TID WC  . oxymetazoline  2 spray Each Nare Once  . pantoprazole sodium  40 mg Per Tube Daily  . potassium chloride  40 mEq Oral BID  . QUEtiapine  25 mg Oral QHS  . sodium chloride  10-40 mL Intracatheter Q12H  . vancomycin  750 mg Intravenous Q24H    Infusions: . sodium chloride 10 mL/hr at 09/25/14 2300  . sodium chloride 250 mL (09/22/14 0600)  . amiodarone 30 mg/hr (09/26/14 0800)  . dexmedetomidine 1 mcg/kg/hr (09/26/14 0800)  . DOPamine 5 mcg/kg/min (09/26/14 0800)  . EPINEPHrine 4 mg in dextrose 5% 250 mL infusion (16 mcg/mL) 2 mcg/min (09/26/14 0844)  . lactated ringers Stopped (09/18/14 1204)  . lactated ringers 10 mL/hr at 09/22/14 2257  . midazolam (VERSED) infusion 6 mg/hr (09/26/14 0800)  . milrinone 0.25 mcg/kg/min (09/26/14 0800)  . norepinephrine (LEVOPHED) Adult infusion 17 mcg/min (09/26/14 0800)    PRN Medications: sodium chloride, ipratropium-albuterol, levalbuterol, midazolam, midazolam, morphine injection, ondansetron (ZOFRAN) IV, oxyCODONE, potassium chloride, sodium chloride, traMADol   Assessment:   1. Cardiogenic shock 2. Acute systolic HF EF 25-30% by TEE 4/15 3. Acute anterolateral STEMI    --Emergent CABG 4/13 4. Acute respiratory failure 5. Thrombocytopenia 6. Acute renal failure, stage III 7. Hyponatremia/hyponkalemia 8. PAF with RVR 9. Hyperbilirubinemia  Plan/Discussion:    He has sort of plateaued. Main issues are 1) failure to wean from vent, 2) severe 3rd spacing of fluids which hasn't responded well to IV lasix and 3) pressor dependence.  He is scheduled for trach soon   As far as volume is concerned with try combination of demadex and acetazolamide and  see if this helps mobilize fluid. His drips have all been concentrated.   Wean pressors as tolerated.   Hyberbilirubinemia improving.  WBC stable. Continue vanc and ceftaz. Bcx drawn 4/25   The patient is critically ill with multiple organ systems failure and requires high complexity decision making for assessment and support, frequent evaluation and titration of therapies, application of advanced monitoring technologies and extensive interpretation of multiple databases.   Critical Care Time devoted to patient care services described in this note is 35 Minutes.   Length of Stay: 13 Arvilla Meres MD  09/26/2014, 8:56 AM  Advanced Heart Failure Team Pager [phone] (M-F; 7a - 4p)  Please contact [facility] for night-coverage after hours (4p -7a ) and weekends on [email or web address]

## 2014-09-27 ENCOUNTER — Encounter (HOSPITAL_COMMUNITY): Payer: Self-pay | Admitting: Cardiothoracic Surgery

## 2014-09-27 ENCOUNTER — Inpatient Hospital Stay (HOSPITAL_COMMUNITY): Payer: Medicare Other

## 2014-09-27 DIAGNOSIS — J810 Acute pulmonary edema: Secondary | ICD-10-CM

## 2014-09-27 DIAGNOSIS — Z930 Tracheostomy status: Secondary | ICD-10-CM

## 2014-09-27 DIAGNOSIS — J811 Chronic pulmonary edema: Secondary | ICD-10-CM | POA: Insufficient documentation

## 2014-09-27 LAB — BLOOD GAS, ARTERIAL
Acid-Base Excess: 1.2 mmol/L (ref 0.0–2.0)
Bicarbonate: 25.1 mEq/L — ABNORMAL HIGH (ref 20.0–24.0)
FIO2: 0.4 %
MECHVT: 600 mL
O2 Saturation: 94.2 %
PEEP: 5 cmH2O
Patient temperature: 98.6
RATE: 16 resp/min
TCO2: 26.3 mmol/L (ref 0–100)
pCO2 arterial: 39 mmHg (ref 35.0–45.0)
pH, Arterial: 7.425 (ref 7.350–7.450)
pO2, Arterial: 72.4 mmHg — ABNORMAL LOW (ref 80.0–100.0)

## 2014-09-27 LAB — GLUCOSE, CAPILLARY
GLUCOSE-CAPILLARY: 98 mg/dL (ref 70–99)
GLUCOSE-CAPILLARY: 86 mg/dL (ref 70–99)
Glucose-Capillary: 106 mg/dL — ABNORMAL HIGH (ref 70–99)
Glucose-Capillary: 160 mg/dL — ABNORMAL HIGH (ref 70–99)
Glucose-Capillary: 89 mg/dL (ref 70–99)
Glucose-Capillary: 99 mg/dL (ref 70–99)

## 2014-09-27 LAB — COMPREHENSIVE METABOLIC PANEL
ALT: 69 U/L — ABNORMAL HIGH (ref 0–53)
AST: 96 U/L — ABNORMAL HIGH (ref 0–37)
Albumin: 1.8 g/dL — ABNORMAL LOW (ref 3.5–5.2)
Alkaline Phosphatase: 365 U/L — ABNORMAL HIGH (ref 39–117)
Anion gap: 9 (ref 5–15)
BUN: 36 mg/dL — ABNORMAL HIGH (ref 6–23)
CO2: 24 mmol/L (ref 19–32)
Calcium: 8.1 mg/dL — ABNORMAL LOW (ref 8.4–10.5)
Chloride: 98 mmol/L (ref 96–112)
Creatinine, Ser: 1.74 mg/dL — ABNORMAL HIGH (ref 0.50–1.35)
GFR calc Af Amer: 44 mL/min — ABNORMAL LOW (ref >90)
GFR calc non Af Amer: 38 mL/min — ABNORMAL LOW (ref >90)
Glucose, Bld: 119 mg/dL — ABNORMAL HIGH (ref 70–99)
Potassium: 5 mmol/L (ref 3.5–5.1)
Sodium: 131 mmol/L — ABNORMAL LOW (ref 135–145)
Total Bilirubin: 4.2 mg/dL — ABNORMAL HIGH (ref 0.3–1.2)
Total Protein: 5.1 g/dL — ABNORMAL LOW (ref 6.0–8.3)

## 2014-09-27 LAB — POCT I-STAT, CHEM 8
BUN: 35 mg/dL — AB (ref 6–23)
CHLORIDE: 97 mmol/L (ref 96–112)
Calcium, Ion: 1.16 mmol/L (ref 1.13–1.30)
Creatinine, Ser: 1.8 mg/dL — ABNORMAL HIGH (ref 0.50–1.35)
Glucose, Bld: 110 mg/dL — ABNORMAL HIGH (ref 70–99)
HCT: 40 % (ref 39.0–52.0)
Hemoglobin: 13.6 g/dL (ref 13.0–17.0)
Potassium: 4.5 mmol/L (ref 3.5–5.1)
SODIUM: 133 mmol/L — AB (ref 135–145)
TCO2: 21 mmol/L (ref 0–100)

## 2014-09-27 LAB — CBC WITH DIFFERENTIAL/PLATELET
Basophils Absolute: 0 10*3/uL (ref 0.0–0.1)
Basophils Relative: 0 % (ref 0–1)
Eosinophils Absolute: 0.1 10*3/uL (ref 0.0–0.7)
Eosinophils Relative: 0 % (ref 0–5)
HCT: 33.3 % — ABNORMAL LOW (ref 39.0–52.0)
Hemoglobin: 11.1 g/dL — ABNORMAL LOW (ref 13.0–17.0)
Lymphocytes Relative: 2 % — ABNORMAL LOW (ref 12–46)
Lymphs Abs: 0.2 10*3/uL — ABNORMAL LOW (ref 0.7–4.0)
MCH: 29.3 pg (ref 26.0–34.0)
MCHC: 33.3 g/dL (ref 30.0–36.0)
MCV: 87.9 fL (ref 78.0–100.0)
Monocytes Absolute: 1 10*3/uL (ref 0.1–1.0)
Monocytes Relative: 6 % (ref 3–12)
Neutro Abs: 14.8 10*3/uL — ABNORMAL HIGH (ref 1.7–7.7)
Neutrophils Relative %: 92 % — ABNORMAL HIGH (ref 43–77)
Platelets: 230 10*3/uL (ref 150–400)
RBC: 3.79 MIL/uL — ABNORMAL LOW (ref 4.22–5.81)
RDW: 18.8 % — ABNORMAL HIGH (ref 11.5–15.5)
WBC: 16.1 10*3/uL — ABNORMAL HIGH (ref 4.0–10.5)

## 2014-09-27 LAB — CARBOXYHEMOGLOBIN
Carboxyhemoglobin: 2.1 % — ABNORMAL HIGH (ref 0.5–1.5)
METHEMOGLOBIN: 0.9 % (ref 0.0–1.5)
O2 Saturation: 80.2 %
TOTAL HEMOGLOBIN: 10.7 g/dL — AB (ref 13.5–18.0)

## 2014-09-27 LAB — PROCALCITONIN: Procalcitonin: 0.33 ng/mL

## 2014-09-27 MED ORDER — ALBUMIN HUMAN 5 % IV SOLN
INTRAVENOUS | Status: AC
  Filled 2014-09-27: qty 250

## 2014-09-27 MED ORDER — MIDODRINE HCL 5 MG PO TABS
10.0000 mg | ORAL_TABLET | Freq: Three times a day (TID) | ORAL | Status: DC
  Administered 2014-09-27 – 2014-10-05 (×25): 10 mg via ORAL
  Filled 2014-09-27 (×27): qty 2

## 2014-09-27 MED ORDER — ALBUMIN HUMAN 25 % IV SOLN
12.5000 g | Freq: Four times a day (QID) | INTRAVENOUS | Status: AC
  Administered 2014-09-28 – 2014-09-29 (×5): 12.5 g via INTRAVENOUS
  Filled 2014-09-27 (×7): qty 50

## 2014-09-27 MED ORDER — ALBUMIN HUMAN 5 % IV SOLN
12.5000 g | Freq: Once | INTRAVENOUS | Status: AC
  Administered 2014-09-27: 12.5 g via INTRAVENOUS

## 2014-09-27 MED ORDER — CEFTAZIDIME 1 G IJ SOLR
1.0000 g | Freq: Two times a day (BID) | INTRAMUSCULAR | Status: DC
  Administered 2014-09-27 – 2014-10-03 (×13): 1 g via INTRAVENOUS
  Filled 2014-09-27 (×15): qty 1

## 2014-09-27 NOTE — Progress Notes (Addendum)
Advanced Heart Failure Rounding Note   Subjective:    Remains intubated. Awake  Impella removed 4/20. Swan out 4/24. Underwent trach and left chest tube 4/26  Started on demadex and acetazolamide yesterday. Urine output has picked up. Renal function relatively stable.  Midodrine at 7.5 tid. Levophed back up to 22. Epi at 2. . Co-ox 80%. CVP 8  Maintaining SR on amio    Objective:   Weight Range:  Vital Signs:   Temp:  [97.3 F (36.3 C)-97.6 F (36.4 C)] 97.5 F (36.4 C) (04/27 0739) Pulse Rate:  [76-90] 87 (04/27 0800) Resp:  [14-35] 35 (04/27 0800) BP: (86-112)/(45-60) 87/47 mmHg (04/27 0800) SpO2:  [96 %-100 %] 99 % (04/27 0800) FiO2 (%):  [40 %] 40 % (04/27 0821) Weight:  [72.5 kg (159 lb 13.3 oz)] 72.5 kg (159 lb 13.3 oz) (04/27 0500)    Weight change: Filed Weights   09/25/14 0500 09/26/14 0500 09/27/14 0500  Weight: 74.8 kg (164 lb 14.5 oz) 75.5 kg (166 lb 7.2 oz) 72.5 kg (159 lb 13.3 oz)    Intake/Output:   Intake/Output Summary (Last 24 hours) at 09/27/14 0838 Last data filed at 09/27/14 2956  Gross per 24 hour  Intake 2720.69 ml  Output   3585 ml  Net -864.31 ml     Physical Exam: General:  On trach More awake HEENT: ETT. Scleral icterus.  Neck: supple.Carotids 2+ bilat; no bruits. No lymphadenopathy or thryomegaly appreciated. Cor: Sternal dressing. +CTs PMI nondisplaced. Regular rate & rhythm. Lungs: clear Abdomen: soft, nontender, + distended. No hepatosplenomegaly. No bruits or masses. Hypoactive bowel sounds. Extremities: no cyanosis, clubbing, rash, 2-3+ edema Neuro: alert & orientedx3, cranial nerves grossly intact. moves all 4 extremities w/o difficulty. Affect pleasant  Telemetry: NSR  Labs: Basic Metabolic Panel:  Recent Labs Lab 09/23/14 0400 09/24/14 0322 09/24/14 1708 09/25/14 0355 09/26/14 0410 09/27/14 0500  NA 132* 133* 135 133* 132* 131*  K 3.3* 3.0* 3.4* 4.2 4.5 5.0  CL 96 98 98 99 98 98  CO2 26 26  --  GLUCOSE 77 98 133* 127* 139* 119*  BUN 21 21 24* 27* 34* 36*  CREATININE 1.63* 1.52* 1.50* 1.72* 1.76* 1.74*  CALCIUM 7.7* 7.8*  --  7.8* 7.8* 8.1*  MG  --   --   --   --  1.6  --   PHOS  --   --   --   --  2.2*  --     Liver Function Tests:  Recent Labs Lab 09/23/14 0400 09/24/14 0322 09/25/14 0355 09/26/14 0410 09/27/14 0500  AST 94* 116* 137* 120* 96*  ALT 39 48 68* 74* 69*  ALKPHOS 161* 203* 339* 381* 365*  BILITOT 14.9* 11.5* 7.7* 5.2* 4.2*  PROT 4.8* 4.7* 4.8* 4.8* 5.1*  ALBUMIN 2.5* 2.0* 2.0* 1.8* 1.8*   No results for input(s): LIPASE, AMYLASE in the last 168 hours. No results for input(s): AMMONIA in the last 168 hours.  CBC:  Recent Labs Lab 09/23/14 0400 09/24/14 0322 09/24/14 1708 09/25/14 0355 09/26/14 0410 09/27/14 0500  WBC 12.2* 13.1*  --  14.7* 14.8* 16.1*  NEUTROABS 11.1* 12.0*  --  13.5* 13.3* 14.8*  HGB 10.4* 10.9* 12.6* 10.9* 11.1* 11.1*  HCT 29.3* 30.7* 37.0* 31.0* 32.6* 33.3*  MCV 83.0 85.3  --  86.4 87.6 87.9  PLT 133* 146*  --  176 202 230    Cardiac Enzymes: No results for input(s): CKTOTAL, CKMB, CKMBINDEX, TROPONINI  in the last 168 hours.  BNP: BNP (last 3 results)  Recent Labs  09/13/14 1310  BNP 96.8    ProBNP (last 3 results) No results for input(s): PROBNP in the last 8760 hours.    Other results:  Imaging: Dg Chest Port 1 View  09/27/2014   CLINICAL DATA:  Shortness of breath, pulmonary edema.  EXAM: PORTABLE CHEST - 1 VIEW  COMPARISON:  09/26/2014  FINDINGS: Prior CABG. Heart is normal size. Tracheostomy tube in place, new since prior study. Left chest tube remains in place. No pneumothorax. Heart is normal size. Layering bilateral pleural effusions with bilateral lower lobe airspace opacities, unchanged.  IMPRESSION: New tracheostomy tube. No pneumothorax. Otherwise no significant change.   Electronically Signed   By: Charlett Nose M.D.   On: 09/27/2014 08:19   Dg Chest Port 1 View  09/26/2014   CLINICAL DATA:   Status post tracheostomy tube insertion in chest tube insertion  EXAM: PORTABLE CHEST - 1 VIEW  COMPARISON:  09/25/2014  FINDINGS: Tracheostomy tube tip is midline an above the carina. There is a right arm PICC line with tip in the cavoatrial junction. Feeding tube is in place and is coiled within the stomach. There has been interval repositioning of the left chest tube. No significant pneumothorax identified. Median sternotomy. The heart size is normal. Subsegmental atelectasis and small pleural effusions are again noted.  IMPRESSION: 1. Support apparatus positioned as above. 2. Left chest tube without significant pneumothorax.   Electronically Signed   By: Signa Kell M.D.   On: 09/26/2014 18:08   Dg Chest Port 1 View  09/26/2014   CLINICAL DATA:  74 year old male with dyspnea. For tracheostomy today. Subsequent encounter.  EXAM: PORTABLE CHEST - 1 VIEW  COMPARISON:  09/25/2014.  FINDINGS: Endotracheal tube tip 5.4 cm above the carina.  Right central line tip mid to distal superior vena cava.  Nasogastric tube courses below the diaphragm. Tip is not included on the present exam.  Left-sided chest tube is in place.  No gross pneumothorax.  Post CABG.  Heart size within normal limits.  Hazy opacification mid lower lung zones may represent posteriorly layering pleural effusion although basilar infiltrate difficult to completely exclude in the proper clinical setting. Appearance is unchanged.  Pulmonary vascular congestion/ mild pulmonary edema unchanged.  Epicardial leads in place.  Calcified aorta  IMPRESSION: Pulmonary vascular congestion/mild pulmonary edema with posteriorly layering pleural effusions suspected, unchanged from prior exam as noted above.   Electronically Signed   By: Lacy Duverney M.D.   On: 09/26/2014 08:09     Medications:     Scheduled Medications: . acetaZOLAMIDE  500 mg Per Tube BID  . amiodarone  150 mg Intravenous Once  . antiseptic oral rinse  7 mL Mouth Rinse QID  .  aspirin EC  325 mg Oral Daily   Or  . aspirin  324 mg Per Tube Daily  . atorvastatin  40 mg Oral q1800  . bisacodyl  10 mg Oral Daily   Or  . bisacodyl  10 mg Rectal Daily  . budesonide (PULMICORT) nebulizer solution  0.5 mg Nebulization BID  . cefTAZidime (FORTAZ)  IV  1 g Intravenous Q8H  . chlorhexidine  15 mL Mouth Rinse BID  . digoxin  0.125 mg Per Tube QODAY  . enoxaparin (LOVENOX) injection  30 mg Subcutaneous Daily  . feeding supplement (VITAL 1.5 CAL)  1,000 mL Per Tube Q24H  . insulin aspart  0-9 Units Subcutaneous 6 times per  day  . insulin detemir  15 Units Subcutaneous BID  . lidocaine-prilocaine  1 application Topical Once  . midodrine  7.5 mg Oral TID WC  . oxymetazoline  2 spray Each Nare Once  . pantoprazole sodium  40 mg Per Tube Daily  . potassium chloride  40 mEq Oral BID  . QUEtiapine  50 mg Oral QHS  . sodium chloride  10-40 mL Intracatheter Q12H  . torsemide  40 mg Per Tube BID  . vancomycin  750 mg Intravenous Q24H    Infusions: . sodium chloride 10 mL/hr (09/26/14 1700)  . sodium chloride 250 mL (09/22/14 0600)  . amiodarone 30 mg/hr (09/27/14 0836)  . dexmedetomidine 0.6 mcg/kg/hr (09/27/14 0600)  . DOPamine 5 mcg/kg/min (09/26/14 2000)  . EPINEPHrine 4 mg in dextrose 5% 250 mL infusion (16 mcg/mL) 2 mcg/min (09/27/14 0837)  . lactated ringers Stopped (09/18/14 1204)  . lactated ringers 10 mL/hr at 09/22/14 2257  . midazolam (VERSED) infusion 3 mg/hr (09/27/14 0836)  . milrinone 0.25 mcg/kg/min (09/27/14 0702)  . norepinephrine (LEVOPHED) Adult infusion 24 mcg/min (09/27/14 0701)    PRN Medications: sodium chloride, Place/Maintain arterial line **AND** sodium chloride, ipratropium-albuterol, levalbuterol, midazolam, midazolam, morphine injection, ondansetron (ZOFRAN) IV, oxyCODONE, potassium chloride, sodium chloride, traMADol   Assessment:   1. Cardiogenic shock 2. Acute systolic HF EF 25-30% by TEE 4/15 3. Acute anterolateral STEMI     --Emergent CABG 4/13 4. Acute respiratory failure   --s/p trach 4/26 5. Thrombocytopenia 6. Acute renal failure, stage III 7. Hyponatremia/hyponkalemia 8. PAF with RVR 9. Hyperbilirubinemia  Plan/Discussion:    He is s/p trach.   Volume status improving slowly on demadex and acetazolamide. Will continue.   Increase midodrine to 10 tid. Wean pressors as tolerated. Cut milrinone to 0.125.  WBC up slightly. Continue vanc and ceftaz. Bcx drawn 4/25 - NGTD  Needs rehab.   Length of Stay: 14 Arvilla Meresaniel Kieu Quiggle MD  09/27/2014, 8:38 AM  Advanced Heart Failure Team Pager [phone] (M-F; 7a - 4p)  Please contact [facility] for night-coverage after hours (4p -7a ) and weekends on [email or web address]

## 2014-09-27 NOTE — Progress Notes (Signed)
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  PCCM consulted post-op for assistance with shock and vent management.  STUDIES:  4/13 cardiac cath 4/13 Echo >> EF 35%  SIGNIFICANT EVENTS: 4/13 CABG, closure of PFO, impella, VDRF, cardiogenic shock  4/14 Lost pacemaker capture >> Cardiac arrest >> 8 minutes before ROSC >> wide complex tachycardia >> cardioversion; Hb 5 >> transfused PRBC 4/18 A fib with RVR >> amiodarone added 4/19 Oxygen desaturation >> suctioned mucus plug; a fib with RVR >> cardioversion 4/20 Impella out; A fib with RVR >> cardioversion 4/26 trach placed, L chest tube  SUBJECTIVE:  Good UOP.  Remains on Epi, levophed.  CVP=8.    VITAL SIGNS: Temp:  [97.3 F (36.3 C)-97.6 F (36.4 C)] 97.5 F (36.4 C) (04/27 0739) Pulse Rate:  [76-93] 93 (04/27 1117) Resp:  [14-35] 28 (04/27 1100) BP: (74-124)/(45-60) 124/56 mmHg (04/27 1117) SpO2:  [96 %-100 %] 100 % (04/27 1117) FiO2 (%):  [40 %] 40 % (04/27 1127) Weight:  [159 lb 13.3 oz (72.5 kg)] 159 lb 13.3 oz (72.5 kg) (04/27 0500) HEMODYNAMICS: CVP:  [8 mmHg-12 mmHg] 8 mmHg VENTILATOR SETTINGS: Vent Mode:  [-] PRVC FiO2 (%):  [40 %] 40 % Set Rate:  [16 bmp] 16 bmp Vt Set:  [600 mL] 600 mL PEEP:  [5 cmH20] 5 cmH20 Pressure Support:  [10 cmH20] 10 cmH20 Plateau Pressure:  [16 cmH20-21 cmH20] 18 cmH20 INTAKE / OUTPUT: Intake/Output      04/26 0701 - 04/27 0700 04/27 0701 - 04/28 0700   P.O.  7   I.V. (mL/kg) 2699.6 (37.2) 272.6 (3.8)   NG/GT  240   IV Piggyback 100 50   Total Intake(mL/kg) 2799.6 (38.6) 569.6 (7.9)   Urine (mL/kg/hr) 3575  (2.1) 800 (2.4)   Other 100 (0.1)    Stool 0 (0)    Blood 10 (0)    Chest Tube     Total Output 3685 800   Net -885.4 -230.5        Stool Occurrence 1 x     PHYSICAL EXAMINATION: General: no distress, trach in place, Neuro: Awake, tracking and following commands HEENT: [place]/AT, PERRL, EOM-I and MMM Cardiovascular: regular, tachycardic Lungs: scattered faint crackles Abdomen: soft, non tender  Musculoskeletal: 1+ edema Skin: no rashes  LABS:  CBC  Recent Labs Lab 09/25/14 0355 09/26/14 0410 09/27/14 0500  WBC 14.7* 14.8* 16.1*  HGB 10.9* 11.1* 11.1*  HCT 31.0* 32.6* 33.3*  PLT 176 202 230     Coag's  Recent Labs Lab 09/21/14 0900  INR 1.01    BMET  Recent Labs Lab 09/25/14 0355 09/26/14 0410 09/27/14 0500  NA 133* 132* 131*  K 4.2 4.5 5.0  CL 99 98 98  CO2 BUN 27* 34* 36*  CREATININE 1.72* 1.76* 1.74*  GLUCOSE 127* 139* 119*     Electrolytes  Recent Labs Lab 09/25/14 0355 09/26/14 0410 09/27/14 0500  CALCIUM 7.8* 7.8* 8.1*  MG  --  1.6  --   PHOS  --  2.2*  --      Sepsis Markers  Recent Labs Lab 09/27/14 0500  PROCALCITON 0.33   ABG  Recent Labs Lab 09/25/14 0341 09/26/14 0425 09/27/14 0412  PHART 7.459* 7.470* 7.425  PCO2ART 37.5 37.7 39.0  PO2ART 76.4* 89.0 72.4*   Liver Enzymes  Recent Labs Lab 09/25/14 0355 09/26/14 0410 09/27/14 0500  AST 137* 120* 96*  ALT 68* 74* 69*  ALKPHOS 339* 381* 365*  BILITOT 7.7* 5.2* 4.2*  ALBUMIN 2.0* 1.8* 1.8*   Cardiac Enzymes No results for input(s): TROPONINI, PROBNP in the last 168 hours.   Glucose  Recent Labs Lab 09/26/14 1232 09/26/14 1529 09/26/14 1917 09/26/14 2332 09/27/14 0359 09/27/14 0734  GLUCAP 146* 89 89 79 98 86   Imaging Dg Chest Port 1 View  09/26/2014   CLINICAL DATA:  Status post tracheostomy tube insertion in chest tube insertion  EXAM: PORTABLE CHEST - 1 VIEW  COMPARISON:  09/25/2014  FINDINGS: Tracheostomy tube tip is midline an above  the carina. There is a right arm PICC line with tip in the cavoatrial junction. Feeding tube is in place and is coiled within the stomach. There has been interval repositioning of the left chest tube. No significant pneumothorax identified. Median sternotomy. The heart size is normal. Subsegmental atelectasis and small pleural effusions are again noted.  IMPRESSION: 1. Support apparatus positioned as above. 2. Left chest tube without significant pneumothorax.   Electronically Signed   By: Signa Kell M.D.   On: 09/26/2014 18:08   Dg Chest Port 1 View  09/26/2014   CLINICAL DATA:  73 year old male with dyspnea. For tracheostomy today. Subsequent encounter.  EXAM: PORTABLE CHEST - 1 VIEW  COMPARISON:  09/25/2014.  FINDINGS: Endotracheal tube tip 5.4 cm above the carina.  Right central line tip mid to distal superior vena cava.  Nasogastric tube courses below the diaphragm. Tip is not included on the present exam.  Left-sided chest tube is in place.  No gross pneumothorax.  Post CABG.  Heart size within normal limits.  Hazy opacification mid lower lung zones may represent posteriorly layering pleural effusion although basilar infiltrate difficult to completely exclude in the proper clinical setting. Appearance is unchanged.  Pulmonary vascular congestion/ mild pulmonary edema unchanged.  Epicardial leads in place.  Calcified aorta  IMPRESSION: Pulmonary vascular congestion/mild pulmonary edema with posteriorly layering pleural effusions suspected, unchanged from prior exam as noted above.   Electronically Signed   By: Lacy Duverney M.D.   On: 09/26/2014 08:09   ASSESSMENT / PLAN:  CARDIOVASCULAR A:  STEMI s/p emergent CABG and PFO closure 4/13. Acute systolic CHF - EF 25-30% Cardiogenic shock. A fib with RVR. Hx of HTN, HLD. P:  - Continue pressors and titrate as able - currently remains on Epi , levophed  - Titrate for SBP of 65. - Diuresis per HF team - TCTS following - cont amiodarone,  milrinone gtt per cards  - midodrine   PULMONARY ETT 4/13 >>4/26 Trach (CVTS) 4/26>>> L chest tube 4/26>>> A: Prolonged VDRF Pleural effusion  P:   - Continue full vent support given volume status and hemodynamics. - Follow CXR, ABG - begin PS trials   RENAL A:   Hyponatremia Hypokalemia. AKI Cr stable, good UOP  P:   - Monitor BMET intermittently - Monitor I/Os - Correct electrolytes as indicated - KVO  GASTROINTESTINAL A:   Hx of GERD Hyperbilirubinemia P:   - Cont SUP - GI service following for hyperbilirubinemia - TF as tol   HEMATOLOGIC A:   Mild anemia without acute  blood loss Thrombocytopenia, improving P:  - DVT px: LMWH - Monitor CBC intermittently - Transfuse per usual ICU guidelines  INFECTIOUS A:   Fever, unclear source Leukocytosis  P:   Ceftaz 4/20>>> Vanc 4/26>>>  - Abx per TCTS - PCT =0.33  ENDOCRINE A:   Hyperglycemia. P:   - Cont SSI   NEUROLOGIC A:   Acute encephalopathy, resolved Hx of Anxiety / Depression. P:   - RASS goal 0 - PRN morphine    Dirk DressKaty Whiteheart, NP 09/27/2014  11:47 AM Pager: [phone] or [phone]  Trach in place yesterday, PEEP and FiO2 down to 40 and 5.  Will begin PS trials, continue current fluid management.  Restart TF.  Continue to require high dose pressors, unfortunately titrating up at this point for BP support.  Milrinone down however.  KVO IVF and continue to diurese.  The patient is critically ill with multiple organ systems failure and requires high complexity decision making for assessment and support, frequent evaluation and titration of therapies, application of advanced monitoring technologies and extensive interpretation of multiple databases.   Critical Care Time devoted to patient care services described in this note is  35  Minutes. This time reflects time of care of this signee Dr Koren BoundWesam Yacoub. This critical care time does not reflect procedure time, or teaching time or  supervisory time of PA/NP/Med student/Med Resident etc but could involve care discussion time.  Alyson ReedyWesam G. Yacoub, M.D. [facility]. Pager: [phone]. After hours pager: [phone].

## 2014-09-27 NOTE — Progress Notes (Signed)
Per Dr. Cornelius Moraswen, do not restart tube feed until AM.

## 2014-09-27 NOTE — Progress Notes (Signed)
Order trach supplies at 2000 and still haven't received.  Called materials to inquire.  They stated they do not have #7 shiley, they only have even numbers.  Discussed with RT and agreed to have #6 shiley equipment at bedside.

## 2014-09-27 NOTE — Op Note (Signed)
NAMMarzella Knight:  [name], [name]               ACCOUNT NO.:  [id number]  MEDICAL RECORD NO.:  [id number]  LOCATION:  2S14C                        FACILITY:  [facility]  PHYSICIAN:  [name], M.D.  DATE OF BIRTH:  1940-12-04  DATE OF PROCEDURE:  09/26/2014 DATE OF DISCHARGE:                              OPERATIVE REPORT   OPERATIONS: 1. Tracheostomy with a #7 Shiley tracheostomy. 2. Placement of left chest tube for left pleural effusion.  SURGEON:  [name], M.D.  ANESTHESIA:  General.  PREOPERATIVE DIAGNOSIS:  Ventilator-dependent respiratory failure after emergency coronary artery bypass graft for acute myocardial infarction with cardiogenic shock and left pleural effusion.  POSTOPERATIVE DIAGNOSIS:  Ventilator-dependent respiratory failure after emergency coronary artery bypass graft for acute myocardial infarction with cardiogenic shock and left pleural effusion.  OPERATIVE PROCEDURE:  The patient was brought directly from the ICU to the operating room for tracheostomy as the patient is required intubation for almost 2 weeks.  He was placed supine on the operating room table and general anesthesia was induced.  A proper time-out was performed.  The neck was prepped and draped.  A small incision was made above the sternal notch.  Cautery was used to dissect down to the pretracheal plane.  An incision was made in the trachea beneath the cricoid cartilage.  The endotracheal tube was seen.  As the endotracheal tube was withdrawn, the incision was dilated and the tracheal incision was enlarged.  A 7-French tracheostomy appliance was then inserted into the trachea without difficulty.  It was connected to the ventilator circuit.  There was good ventilation documented.  It was secured to the skin with silk sutures.  The left chest was prepped and draped and a small incision was made in the fifth interspace in the anterior axillary line.  A 28-French chest tube was inserted in the  pleural space, which immediately drained 1.2 liters of serosanguineous fluid.  It was secured to the skin with silk sutures and connected to an underwater seal Pleur-Evac drainage chamber. Sterile dressings were applied.  The patient returned to the ICU in stable condition.     [name], M.D.     PV/MEDQ  D:  09/26/2014  T:  09/27/2014  Job:  [id number]

## 2014-09-27 NOTE — Progress Notes (Addendum)
TCTS DAILY ICU PROGRESS NOTE                   [address]            [place] [id number]          [phone]   1 Day Post-Op Procedure(s) (LRB): TRACHEOSTOMY (N/A) CHEST TUBE INSERTION (Left)  Total Length of Stay:  LOS: 14 days   Subjective: Trach yesterday  Objective: Vital signs in last 24 hours: Temp:  [97.3 F (36.3 C)-97.6 F (36.4 C)] 97.5 F (36.4 C) (04/27 0739) Pulse Rate:  [76-90] 87 (04/27 0800) Cardiac Rhythm:  [-] Normal sinus rhythm (04/27 0800) Resp:  [14-35] 35 (04/27 0800) BP: (86-112)/(45-60) 87/47 mmHg (04/27 0800) SpO2:  [96 %-100 %] 99 % (04/27 0800) FiO2 (%):  [40 %] 40 % (04/27 0821) Weight:  [159 lb 13.3 oz (72.5 kg)] 159 lb 13.3 oz (72.5 kg) (04/27 0500)  Filed Weights   09/25/14 0500 09/26/14 0500 09/27/14 0500  Weight: 164 lb 14.5 oz (74.8 kg) 166 lb 7.2 oz (75.5 kg) 159 lb 13.3 oz (72.5 kg)    Weight change: -6 lb 9.8 oz (-3 kg)   Hemodynamic parameters for last 24 hours: CVP:  [12 mmHg] 12 mmHg  Intake/Output from previous day: 04/26 0701 - 04/27 0700 In: 2799.6 [I.V.:2699.6; IV Piggyback:100] Out: 3685 [Urine:3575; Blood:10]  Intake/Output this shift: Total I/O In: 7 [P.O.:7] Out: -   Current Meds: Scheduled Meds: . acetaZOLAMIDE  500 mg Per Tube BID  . amiodarone  150 mg Intravenous Once  . antiseptic oral rinse  7 mL Mouth Rinse QID  . aspirin EC  325 mg Oral Daily   Or  . aspirin  324 mg Per Tube Daily  . atorvastatin  40 mg Oral q1800  . bisacodyl  10 mg Oral Daily   Or  . bisacodyl  10 mg Rectal Daily  . budesonide (PULMICORT) nebulizer solution  0.5 mg Nebulization BID  . cefTAZidime (FORTAZ)  IV  1 g Intravenous Q8H  . chlorhexidine  15 mL Mouth Rinse BID  . digoxin  0.125 mg Per Tube QODAY  . enoxaparin (LOVENOX) injection  30 mg Subcutaneous Daily  . feeding supplement (VITAL 1.5 CAL)  1,000 mL Per Tube Q24H  . insulin aspart  0-9 Units Subcutaneous 6 times per day  . insulin detemir   15 Units Subcutaneous BID  . lidocaine-prilocaine  1 application Topical Once  . midodrine  10 mg Oral TID WC  . oxymetazoline  2 spray Each Nare Once  . pantoprazole sodium  40 mg Per Tube Daily  . potassium chloride  40 mEq Oral BID  . QUEtiapine  50 mg Oral QHS  . sodium chloride  10-40 mL Intracatheter Q12H  . torsemide  40 mg Per Tube BID  . vancomycin  750 mg Intravenous Q24H   Continuous Infusions: . sodium chloride 10 mL/hr (09/26/14 1700)  . sodium chloride 250 mL (09/22/14 0600)  . amiodarone 30 mg/hr (09/27/14 0836)  . dexmedetomidine 0.6 mcg/kg/hr (09/27/14 0600)  . DOPamine 5 mcg/kg/min (09/26/14 2000)  . EPINEPHrine 4 mg in dextrose 5% 250 mL infusion (16 mcg/mL) 2 mcg/min (09/27/14 0837)  . lactated ringers Stopped (09/18/14 1204)  . lactated ringers 10 mL/hr at 09/22/14 2257  . midazolam (VERSED) infusion 3 mg/hr (09/27/14 0836)  . milrinone 0.25 mcg/kg/min (09/27/14 0702)  . norepinephrine (LEVOPHED) Adult infusion 24 mcg/min (09/27/14 0701)   PRN Meds:.sodium chloride, Place/Maintain arterial  line **AND** sodium chloride, ipratropium-albuterol, levalbuterol, midazolam, midazolam, morphine injection, ondansetron (ZOFRAN) IV, oxyCODONE, potassium chloride, sodium chloride, traMADol  General appearance: alert and no distress Heart: regular rate and rhythm Lungs: coarse BS throughout Abdomen: benign Extremities: + edema Wound: incis ok  Lab Results: CBC: Recent Labs  09/26/14 0410 09/27/14 0500  WBC 14.8* 16.1*  HGB 11.1* 11.1*  HCT 32.6* 33.3*  PLT 202 230   BMET:  Recent Labs  09/26/14 0410 09/27/14 0500  NA 132* 131*  K 4.5 5.0  CL 98 98  CO2 25 24  GLUCOSE 139* 119*  BUN 34* 36*  CREATININE 1.76* 1.74*  CALCIUM 7.8* 8.1*    PT/INR: No results for input(s): LABPROT, INR in the last 72 hours. Radiology: Dg Chest Port 1 View  09/26/2014   CLINICAL DATA:  Status post tracheostomy tube insertion in chest tube insertion  EXAM: PORTABLE CHEST - 1  VIEW  COMPARISON:  09/25/2014  FINDINGS: Tracheostomy tube tip is midline an above the carina. There is a right arm PICC line with tip in the cavoatrial junction. Feeding tube is in place and is coiled within the stomach. There has been interval repositioning of the left chest tube. No significant pneumothorax identified. Median sternotomy. The heart size is normal. Subsegmental atelectasis and small pleural effusions are again noted.  IMPRESSION: 1. Support apparatus positioned as above. 2. Left chest tube without significant pneumothorax.   Electronically Signed   By: Signa Kellaylor  Stroud M.D.   On: 09/26/2014 18:08   Dg Chest Port 1 View  09/26/2014   CLINICAL DATA:  74 year old male with dyspnea. For tracheostomy today. Subsequent encounter.  EXAM: PORTABLE CHEST - 1 VIEW  COMPARISON:  09/25/2014.  FINDINGS: Endotracheal tube tip 5.4 cm above the carina.  Right central line tip mid to distal superior vena cava.  Nasogastric tube courses below the diaphragm. Tip is not included on the present exam.  Left-sided chest tube is in place.  No gross pneumothorax.  Post CABG.  Heart size within normal limits.  Hazy opacification mid lower lung zones may represent posteriorly layering pleural effusion although basilar infiltrate difficult to completely exclude in the proper clinical setting. Appearance is unchanged.  Pulmonary vascular congestion/ mild pulmonary edema unchanged.  Epicardial leads in place.  Calcified aorta  IMPRESSION: Pulmonary vascular congestion/mild pulmonary edema with posteriorly layering pleural effusions suspected, unchanged from prior exam as noted above.   Electronically Signed   By: Lacy DuverneySteven  Olson M.D.   On: 09/26/2014 08:09     Assessment/Plan: S/P Procedure(s) (LRB): TRACHEOSTOMY (N/A) CHEST TUBE INSERTION (Left)  1 conts aggressive hemodyn management, weaning of gtts as tol, diuresis 2 full vent support/trach care 3 rhythm stable     GOLD,WAYNE E 09/27/2014 8:48 AM  Cont vent  wean tomorrow OOB to chair Will prob need LTACH eval patient examined and medical record reviewed,agree with above note. Kathlee Nationseter Van Trigt III 09/27/2014

## 2014-09-27 NOTE — Procedures (Signed)
Arterial Catheter Insertion Procedure Note [name] [id number] [birth year]  Procedure: Insertion of Arterial Catheter  Indications: Blood pressure monitoring  Procedure Details Consent: Unable to obtain consent because of altered level of consciousness. Time Out: Verified patient identification, verified procedure, site/side was marked, verified correct patient position, special equipment/implants available, medications/allergies/relevent history reviewed, required imaging and test results available.  Performed  Maximum sterile technique was used including antiseptics, cap, gloves, gown, hand hygiene, mask and sheet. Skin prep: Chlorhexidine; local anesthetic administered 20 gauge catheter was inserted into left radial artery using the Seldinger technique.  Evaluation Blood flow good; BP tracing good. Complications: No apparent complications.  Good blood flow, good waveform. No complications noted.  [name], [name] 09/27/2014

## 2014-09-28 ENCOUNTER — Encounter (HOSPITAL_COMMUNITY): Payer: Self-pay | Admitting: Cardiothoracic Surgery

## 2014-09-28 ENCOUNTER — Inpatient Hospital Stay (HOSPITAL_COMMUNITY): Payer: Medicare Other

## 2014-09-28 LAB — COMPREHENSIVE METABOLIC PANEL
ALT: 72 U/L — ABNORMAL HIGH (ref 0–53)
AST: 115 U/L — ABNORMAL HIGH (ref 0–37)
Albumin: 2.3 g/dL — ABNORMAL LOW (ref 3.5–5.2)
Alkaline Phosphatase: 485 U/L — ABNORMAL HIGH (ref 39–117)
Anion gap: 13 (ref 5–15)
BUN: 39 mg/dL — ABNORMAL HIGH (ref 6–23)
CO2: 24 mmol/L (ref 19–32)
Calcium: 8 mg/dL — ABNORMAL LOW (ref 8.4–10.5)
Chloride: 96 mmol/L (ref 96–112)
Creatinine, Ser: 1.99 mg/dL — ABNORMAL HIGH (ref 0.50–1.35)
GFR calc Af Amer: 37 mL/min — ABNORMAL LOW (ref >90)
GFR calc non Af Amer: 32 mL/min — ABNORMAL LOW (ref >90)
Glucose, Bld: 127 mg/dL — ABNORMAL HIGH (ref 70–99)
Potassium: 3.6 mmol/L (ref 3.5–5.1)
Sodium: 133 mmol/L — ABNORMAL LOW (ref 135–145)
Total Bilirubin: 3.3 mg/dL — ABNORMAL HIGH (ref 0.3–1.2)
Total Protein: 5.6 g/dL — ABNORMAL LOW (ref 6.0–8.3)

## 2014-09-28 LAB — PHOSPHORUS: PHOSPHORUS: 4.5 mg/dL (ref 2.3–4.6)

## 2014-09-28 LAB — GLUCOSE, CAPILLARY
GLUCOSE-CAPILLARY: 133 mg/dL — AB (ref 70–99)
GLUCOSE-CAPILLARY: 123 mg/dL — AB (ref 70–99)
GLUCOSE-CAPILLARY: 95 mg/dL (ref 70–99)
Glucose-Capillary: 104 mg/dL — ABNORMAL HIGH (ref 70–99)
Glucose-Capillary: 119 mg/dL — ABNORMAL HIGH (ref 70–99)

## 2014-09-28 LAB — CBC WITH DIFFERENTIAL/PLATELET
Basophils Absolute: 0 10*3/uL (ref 0.0–0.1)
Basophils Relative: 0 % (ref 0–1)
Eosinophils Absolute: 0 10*3/uL (ref 0.0–0.7)
Eosinophils Relative: 0 % (ref 0–5)
HCT: 31 % — ABNORMAL LOW (ref 39.0–52.0)
Hemoglobin: 10.4 g/dL — ABNORMAL LOW (ref 13.0–17.0)
Lymphocytes Relative: 4 % — ABNORMAL LOW (ref 12–46)
Lymphs Abs: 0.5 10*3/uL — ABNORMAL LOW (ref 0.7–4.0)
MCH: 30.1 pg (ref 26.0–34.0)
MCHC: 33.5 g/dL (ref 30.0–36.0)
MCV: 89.6 fL (ref 78.0–100.0)
Monocytes Absolute: 1 10*3/uL (ref 0.1–1.0)
Monocytes Relative: 7 % (ref 3–12)
Neutro Abs: 11.8 10*3/uL — ABNORMAL HIGH (ref 1.7–7.7)
Neutrophils Relative %: 89 % — ABNORMAL HIGH (ref 43–77)
Platelets: 284 10*3/uL (ref 150–400)
RBC: 3.46 MIL/uL — ABNORMAL LOW (ref 4.22–5.81)
RDW: 18.4 % — ABNORMAL HIGH (ref 11.5–15.5)
WBC: 13.3 10*3/uL — ABNORMAL HIGH (ref 4.0–10.5)

## 2014-09-28 LAB — CARBOXYHEMOGLOBIN
Carboxyhemoglobin: 1.3 % (ref 0.5–1.5)
Methemoglobin: 1.2 % (ref 0.0–1.5)
O2 Saturation: 73.7 %
Total hemoglobin: 9.5 g/dL — ABNORMAL LOW (ref 13.5–18.0)

## 2014-09-28 LAB — POCT I-STAT, CHEM 8
BUN: 37 mg/dL — ABNORMAL HIGH (ref 6–23)
Calcium, Ion: 1.19 mmol/L (ref 1.13–1.30)
Chloride: 98 mmol/L (ref 96–112)
Creatinine, Ser: 2 mg/dL — ABNORMAL HIGH (ref 0.50–1.35)
GLUCOSE: 125 mg/dL — AB (ref 70–99)
HEMATOCRIT: 33 % — AB (ref 39.0–52.0)
HEMOGLOBIN: 11.2 g/dL — AB (ref 13.0–17.0)
Potassium: 3.1 mmol/L — ABNORMAL LOW (ref 3.5–5.1)
Sodium: 134 mmol/L — ABNORMAL LOW (ref 135–145)
TCO2: 21 mmol/L (ref 0–100)

## 2014-09-28 LAB — MAGNESIUM: Magnesium: 1.7 mg/dL (ref 1.5–2.5)

## 2014-09-28 LAB — PROCALCITONIN: Procalcitonin: 0.4 ng/mL

## 2014-09-28 MED ORDER — POTASSIUM CHLORIDE 10 MEQ/50ML IV SOLN
10.0000 meq | INTRAVENOUS | Status: AC
  Filled 2014-09-28 (×2): qty 50

## 2014-09-28 MED ORDER — METOCLOPRAMIDE HCL 5 MG/ML IJ SOLN
10.0000 mg | Freq: Four times a day (QID) | INTRAMUSCULAR | Status: DC
  Administered 2014-09-28 – 2014-10-02 (×16): 10 mg via INTRAVENOUS
  Filled 2014-09-28 (×22): qty 2

## 2014-09-28 MED ORDER — POTASSIUM CHLORIDE 10 MEQ/50ML IV SOLN
10.0000 meq | INTRAVENOUS | Status: AC
  Administered 2014-09-28 (×3): 10 meq via INTRAVENOUS
  Filled 2014-09-28: qty 50

## 2014-09-28 MED ORDER — POTASSIUM CHLORIDE 10 MEQ/50ML IV SOLN
10.0000 meq | INTRAVENOUS | Status: AC
  Administered 2014-09-28 (×3): 10 meq via INTRAVENOUS
  Filled 2014-09-28 (×3): qty 50

## 2014-09-28 MED ORDER — SODIUM CHLORIDE 0.9 % IV SOLN
250.0000 mL | INTRAVENOUS | Status: DC
  Administered 2014-09-29: 1000 mL via INTRAVENOUS

## 2014-09-28 NOTE — Progress Notes (Signed)
      [address]       [place], [id number]             [phone]      Comfortable  BP 117/49 mmHg  Pulse 93  Temp(Src) 98.4 F (36.9 C) (Oral)  Resp 25  Ht 5\' 7"  (1.702 m)  Wt 159 lb 2.8 oz (72.2 kg)  BMI 24.92 kg/m2  SpO2 100%   Intake/Output Summary (Last 24 hours) at 09/28/14 1837 Last data filed at 09/28/14 1700  Gross per 24 hour  Intake 2873.39 ml  Output   3015 ml  Net -141.61 ml    K= 3.1- being supplemented  Viviann SpareSteven C. Dorris FetchHendrickson, MD [facility] [phone]

## 2014-09-28 NOTE — Evaluation (Signed)
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 Pt very deconditioned after prolonged hospitalization and vent dependence. Patient will benefit from skilled PT to increase their independence and safety with mobility to allow discharge to the venue listed below.       Follow Up Recommendations LTACH;Supervision/Assistance - 24 hour    Equipment Recommendations  Other (comment) (TBA)    Recommendations for Other Services OT consult     Precautions / Restrictions Precautions Precautions: Sternal;Fall      Mobility  Bed Mobility               General bed mobility comments: not tested; pt had been lifted into recliner  Transfers Overall transfer level: Needs assistance   Transfers: Sit to/from Stand Sit to Stand: Total assist         General transfer comment: attempted to at least partial stand to scoot hips back into chair; pt pushing with his legs, however unable to get hips to clear chair with +1 assist  Ambulation/Gait                Stairs            Wheelchair Mobility    Modified Rankin (Stroke Patients Only)       Balance Overall balance assessment: Needs assistance Sitting-balance support: No upper extremity supported;Feet supported Sitting balance-Leahy Scale: Zero                                       Pertinent Vitals/Pain Pt weaning on trach collar (FiO2 60%) with RR 29-34.  SaO2 99-100% HR 97-110  Pain Assessment: No/denies pain    Home Living Family/patient expects to be discharged to:: Other (Comment) (LTACH)                  Additional Comments: Pt with trach and unable to read his lips for further history    Prior Function           Comments: Pt with trach and unable to read his lips for further history     Hand Dominance   Dominant Hand: Right    Extremity/Trunk Assessment   Upper Extremity Assessment: RUE deficits/detail;LUE deficits/detail RUE Deficits / Details: AAROM Rt shoulder to 120; 2+/5 shoulder; elbow flexion/extension 3+     LUE Deficits / Details: AAROM Rt shoulder to 120; 3-/5 shoulder; elbow flexion/extension 3+   Lower Extremity Assessment: RLE deficits/detail;LLE deficits/detail RLE Deficits / Details: AAROM at ankles knees WFL; Knee extension 3+/5, hip flexdion 2+ LLE Deficits / Details: AAROM at ankles knees WFL; Knee extension 3/5, hip flexion 2+  Cervical / Trunk Assessment: Other exceptions  Communication   Communication: Tracheostomy  Cognition Arousal/Alertness: Awake/alert Behavior During Therapy: WFL for tasks assessed/performed Overall Cognitive Status: Difficult to assess                      General Comments      Exercises General Exercises - Upper Extremity Shoulder Flexion: AAROM;Both;5 reps;Seated (unilaterally) Shoulder Extension: AROM;Strengthening;Seated (slight resistance given) Elbow Flexion: AROM;Both;Other reps (comment) Elbow  Extension: AROM;Strengthening;Both;Other reps (comment) (slight resistance) General Exercises - Lower Extremity Ankle Circles/Pumps: AROM;Both;10 reps Quad Sets: AROM;5 reps;Seated Long Arc Quad: AROM;Both;5 reps;Seated      Assessment/Plan    PT Assessment Patient needs continued PT services  PT Diagnosis Generalized weakness   PT Problem List Decreased strength;Decreased activity tolerance;Decreased balance;Decreased mobility;Decreased knowledge of use of DME;Decreased knowledge of precautions;Cardiopulmonary status limiting activity  PT Treatment Interventions DME instruction;Gait training;Functional  mobility training;Therapeutic activities;Therapeutic exercise;Balance training;Cognitive remediation;Patient/family education   PT Goals (Current goals can be found in the Care Plan section) Acute Rehab PT Goals Patient Stated Goal: Nods "yes" when asked if wants to work on getting stronger and more independent PT Goal Formulation: With patient Time For Goal Achievement: 10/12/14 Potential to Achieve Goals: Fair    Frequency Min 3X/week   Barriers to discharge Other (comment) unknown caregiver support    Co-evaluation               End of Session Equipment Utilized During Treatment: Oxygen Activity Tolerance: Patient limited by fatigue Patient left: in chair;with call bell/phone within reach;with nursing/sitter in room;with restraints reapplied (bil mitts maintained on pt throughout) Nurse Communication: Need for lift equipment         Time: 1610-9604 PT Time Calculation (min) (ACUTE ONLY): 25 min   Charges:   PT Evaluation $Initial PT Evaluation Tier I: 1 Procedure PT Treatments $Therapeutic Exercise: 8-22 mins   PT G Codes:        Nanci Lakatos Oct 05, 2014, 12:10 PM Pager [phone]

## 2014-09-28 NOTE — Progress Notes (Signed)
Patient seen for trach team follow up.  Patient still requiring vent support, although he is weaning.  No education given at this time.  Will follow up as needed.

## 2014-09-28 NOTE — Progress Notes (Signed)
Advanced Heart Failure Rounding Note   Subjective:     Impella removed 4/20. Swan out 4/24. Underwent trach and left chest tube 4/26  On trach. Awake following commands.   Remains on demadex and acetazolamide yesterday. Urine output has picked up. Renal function worse. Midodrine at 10 tid. Levophed down to 8. Epi off. . Co-ox 74%. CVP 6  Maintaining SR on amio    Objective:   Weight Range:  Vital Signs:   Temp:  [98.4 F (36.9 C)-99 F (37.2 C)] 98.5 F (36.9 C) (04/28 1929) Pulse Rate:  [91-102] 92 (04/28 1900) Resp:  [16-35] 24 (04/28 1930) BP: (85-127)/(42-55) 117/49 mmHg (04/28 1610) SpO2:  [93 %-100 %] 100 % (04/28 1952) FiO2 (%):  [40 %-60 %] 40 % (04/28 1952) Weight:  [72.2 kg (159 lb 2.8 oz)] 72.2 kg (159 lb 2.8 oz) (04/28 0545)    Weight change: Filed Weights   09/26/14 0500 09/27/14 0500 09/28/14 0545  Weight: 75.5 kg (166 lb 7.2 oz) 72.5 kg (159 lb 13.3 oz) 72.2 kg (159 lb 2.8 oz)    Intake/Output:   Intake/Output Summary (Last 24 hours) at 09/28/14 2027 Last data filed at 09/28/14 1925  Gross per 24 hour  Intake 2672.81 ml  Output   3391 ml  Net -718.19 ml     Physical Exam: General:  On trach  awake HEENT: ETT. Scleral icterus.  Neck: supple.Carotids 2+ bilat; no bruits. No lymphadenopathy or thryomegaly appreciated. Cor: Sternal dressing. +CTs PMI nondisplaced. Regular rate & rhythm. Lungs: clear Abdomen: soft, nontender, + distended. No hepatosplenomegaly. No bruits or masses. Hypoactive bowel sounds. Extremities: no cyanosis, clubbing, rash, 1+ edema Neuro: alert & orientedx3, cranial nerves grossly intact. moves all 4 extremities w/o difficulty. Affect pleasant  Telemetry: NSR  Labs: Basic Metabolic Panel:  Recent Labs Lab 09/24/14 0322  09/25/14 0355 09/26/14 0410 09/27/14 0500 09/27/14 1600 09/28/14 0348 09/28/14 1638  NA 133*  < > 133* 132* 131* 133* 133* 134*  K 3.0*  < > 4.2 4.5 5.0 4.5 3.6 3.1*  CL 98  < > 99 98 98 97 96  98  CO2 26  --  --  24  --   GLUCOSE 98  < > 127* 139* 119* 110* 127* 125*  BUN 21  < > 27* 34* 36* 35* 39* 37*  CREATININE 1.52*  < > 1.72* 1.76* 1.74* 1.80* 1.99* 2.00*  CALCIUM 7.8*  --  7.8* 7.8* 8.1*  --  8.0*  --   MG  --   --   --  1.6  --   --  1.7  --   PHOS  --   --   --  2.2*  --   --  4.5  --   < > = values in this interval not displayed.  Liver Function Tests:  Recent Labs Lab 09/24/14 0322 09/25/14 0355 09/26/14 0410 09/27/14 0500 09/28/14 0348  AST 116* 137* 120* 96* 115*  ALT 48 68* 74* 69* 72*  ALKPHOS 203* 339* 381* 365* 485*  BILITOT 11.5* 7.7* 5.2* 4.2* 3.3*  PROT 4.7* 4.8* 4.8* 5.1* 5.6*  ALBUMIN 2.0* 2.0* 1.8* 1.8* 2.3*   No results for input(s): LIPASE, AMYLASE in the last 168 hours. No results for input(s): AMMONIA in the last 168 hours.  CBC:  Recent Labs Lab 09/24/14 0322  09/25/14 0355 09/26/14 0410 09/27/14 0500 09/27/14 1600 09/28/14 0348 09/28/14 1638  WBC 13.1*  --  14.7* 14.8* 16.1*  --  13.3*  --   NEUTROABS 12.0*  --  13.5* 13.3* 14.8*  --  11.8*  --   HGB 10.9*  < > 10.9* 11.1* 11.1* 13.6 10.4* 11.2*  HCT 30.7*  < > 31.0* 32.6* 33.3* 40.0 31.0* 33.0*  MCV 85.3  --  86.4 87.6 87.9  --  89.6  --   PLT 146*  --  176 202 230  --  284  --   < > = values in this interval not displayed.  Cardiac Enzymes: No results for input(s): CKTOTAL, CKMB, CKMBINDEX, TROPONINI in the last 168 hours.  BNP: BNP (last 3 results)  Recent Labs  09/13/14 1310  BNP 96.8    ProBNP (last 3 results) No results for input(s): PROBNP in the last 8760 hours.    Other results:  Imaging: Dg Chest Port 1 View  09/28/2014   CLINICAL DATA:  Shortness of breath  EXAM: PORTABLE CHEST - 1 VIEW  COMPARISON:  09/27/2014  FINDINGS: Tracheostomy, left chest tube and right PICC line remain in place, unchanged. No pneumothorax. Layering bilateral effusions are again noted with lower lobe atelectasis. Heart is normal size. Prior CABG.  IMPRESSION:  Support devices are stable.  Stable layering effusions.   Electronically Signed   By: Charlett Nose M.D.   On: 09/28/2014 08:08   Dg Chest Port 1 View  09/27/2014   CLINICAL DATA:  Shortness of breath, pulmonary edema.  EXAM: PORTABLE CHEST - 1 VIEW  COMPARISON:  09/26/2014  FINDINGS: Prior CABG. Heart is normal size. Tracheostomy tube in place, new since prior study. Left chest tube remains in place. No pneumothorax. Heart is normal size. Layering bilateral pleural effusions with bilateral lower lobe airspace opacities, unchanged.  IMPRESSION: New tracheostomy tube. No pneumothorax. Otherwise no significant change.   Electronically Signed   By: Charlett Nose M.D.   On: 09/27/2014 08:19   Dg Abd Portable 1v  09/28/2014   CLINICAL DATA:  Postoperative ileus.  Vomiting.  EXAM: PORTABLE ABDOMEN - 1 VIEW  COMPARISON:  09/27/2014  FINDINGS: Feeding tube tip is in the distal stomach. There is decreased gaseous distention of the stomach. Is increased air in the colon with decreased air in the small bowel.  COPD.  Left chest tube.  IMPRESSION: Overall, slight decreased air in the bowel. Decreased gaseous distention of the stomach.   Electronically Signed   By: Francene Boyers M.D.   On: 09/28/2014 08:09   Dg Abd Portable 1v  09/27/2014   CLINICAL DATA:  Panda feeding tube placement.  EXAM: PORTABLE ABDOMEN - 1 VIEW  COMPARISON:  09/22/2014  FINDINGS: The feeding tube projects over the stomach antrum.  Distended stomach noted. Prominent stool throughout the colon favors constipation.  IMPRESSION: 1. Panda feeding tube tip:  Stomach antrum. 2. Stomach is mildly distended with gas. 3.  Prominent stool throughout the colon favors constipation.   Electronically Signed   By: Gaylyn Rong M.D.   On: 09/27/2014 16:34     Medications:     Scheduled Medications: . albumin human  12.5 g Intravenous Q6H  . amiodarone  150 mg Intravenous Once  . antiseptic oral rinse  7 mL Mouth Rinse QID  . aspirin EC  325 mg  Oral Daily   Or  . aspirin  324 mg Per Tube Daily  . atorvastatin  40 mg Oral q1800  . bisacodyl  10 mg Oral Daily   Or  . bisacodyl  10 mg Rectal Daily  . budesonide (PULMICORT)  nebulizer solution  0.5 mg Nebulization BID  . cefTAZidime (FORTAZ)  IV  1 g Intravenous Q12H  . chlorhexidine  15 mL Mouth Rinse BID  . digoxin  0.125 mg Per Tube QODAY  . enoxaparin (LOVENOX) injection  30 mg Subcutaneous Daily  . feeding supplement (VITAL 1.5 CAL)  1,000 mL Per Tube Q24H  . insulin aspart  0-9 Units Subcutaneous 6 times per day  . insulin detemir  15 Units Subcutaneous BID  . lidocaine-prilocaine  1 application Topical Once  . metoCLOPramide (REGLAN) injection  10 mg Intravenous 4 times per day  . midodrine  10 mg Oral TID WC  . oxymetazoline  2 spray Each Nare Once  . pantoprazole sodium  40 mg Per Tube Daily  . QUEtiapine  50 mg Oral QHS  . sodium chloride  10-40 mL Intracatheter Q12H  . vancomycin  750 mg Intravenous Q24H    Infusions: . sodium chloride 50 mL/hr at 09/28/14 0600  . sodium chloride 250 mL (09/28/14 1200)  . amiodarone 30 mg/hr (09/28/14 1900)  . dexmedetomidine 0.2 mcg/kg/hr (09/27/14 1000)  . DOPamine 5 mcg/kg/min (09/28/14 1900)  . EPINEPHrine 4 mg in dextrose 5% 250 mL infusion (16 mcg/mL) Stopped (09/28/14 0530)  . lactated ringers 10 mL/hr at 09/22/14 2257  . midazolam (VERSED) infusion 2 mg/hr (09/28/14 0800)  . milrinone 0.125 mcg/kg/min (09/28/14 1900)  . norepinephrine (LEVOPHED) Adult infusion 8 mcg/min (09/28/14 1900)    PRN Medications: sodium chloride, Place/Maintain arterial line **AND** sodium chloride, ipratropium-albuterol, levalbuterol, midazolam, midazolam, morphine injection, ondansetron (ZOFRAN) IV, oxyCODONE, sodium chloride, traMADol   Assessment:   1. Cardiogenic shock 2. Acute systolic HF EF 25-30% by TEE 4/15 3. Acute anterolateral STEMI    --Emergent CABG 4/13 4. Acute respiratory failure   --s/p trach 4/26 5.  Thrombocytopenia 6. Acute renal failure, stage III 7. Hyponatremia/hyponkalemia 8. PAF with RVR 9. Hyperbilirubinemia  Plan/Discussion:    He is s/p trach.   Volume status improved on demadex and acetazolamide but renal function now getting worse. Will stop diuretics   Continue midodrine ay 10 tid. Wean pressors as tolerated. Once off pressors can consider Select placement.  Supp K+   Length of Stay: 15 Arvilla Meresaniel Bensimhon MD  09/28/2014, 8:27 PM  Advanced Heart Failure Team Pager [phone] (M-F; 7a - 4p)  Please contact [facility] for night-coverage after hours (4p -7a ) and weekends on [email or web address]

## 2014-09-28 NOTE — Trach Care Team (Signed)
Trach Care Progression Note   Patient Details Name: Justin MattocksDennis W Eichel MRN: [id number] DOB: Dec 11, 1940 Today's Date: 09/28/2014   Tracheostomy Assessment    Tracheostomy Shiley 7 mm Cuffed (Active)  Status Secured 09/28/2014 12:22 PM  Site Assessment Clean;Dry;Drainage 09/28/2014 11:13 AM  Site Care Cleansed 09/28/2014 11:15 AM  Inner Cannula Care No inner cannula 09/28/2014 11:15 AM  Ties Assessment Clean;Dry;Secure 09/28/2014 12:22 PM  Cuff pressure (cm) 30 cm 09/28/2014  2:52 AM  Emergency Equipment at bedside Yes 09/28/2014 12:22 PM     Care Needs     Respiratory Therapy O2 Device: Ventilator FiO2 (%): 40 % SpO2: 100 % Education: Not applicable Who was educated?:  (not needed at this time.) Follow up recommendations:  (will follow for progress) Respiratory barriers to progression: Other (comment) (still on ventilator)    Speech Language Pathology  SLP chart review complete: Patient ready for SLP services (when tolerating TC)   Physical Therapy PT Recommendation/Assessment: Patient needs continued PT services Follow Up Recommendations: LTACH, Supervision/Assistance - 24 hour PT equipment: Other (comment) (TBA)    Occupational Therapy      Nutritional Patient's Current Diet: NPO Tube Feeding: Vital 1.5 Cal Tube Feeding Frequency: Continuous Tube Feeding Strength: Full strength    Case Management/Social Work      Theatre managerrovider Trach Care Team/Provider Recommendations Trach Care Team Members Present-  Lysbeth PennerMary Smith, RT, Harlon DittyBonnie DeBlois, SLP    Starting trach collar trials. Continue to follow.          Darious Rehman, Silva BandyDebra Anita 09/28/2014, 2:08 PM

## 2014-09-28 NOTE — Progress Notes (Signed)
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  PCCM consulted post-op for assistance with shock and vent management.  STUDIES:  4/13 cardiac cath 4/13 Echo >> EF 35%  SIGNIFICANT EVENTS: 4/13 CABG, closure of PFO, impella, VDRF, cardiogenic shock  4/14 Lost pacemaker capture >> Cardiac arrest >> 8 minutes before ROSC >> wide complex tachycardia >> cardioversion; Hb 5 >> transfused PRBC 4/18 A fib with RVR >> amiodarone added 4/19 Oxygen desaturation >> suctioned mucus plug; a fib with RVR >> cardioversion 4/20 Impella out; A fib with RVR >> cardioversion 4/26 trach placed, L chest tube  SUBJECTIVE:  Off epi, on levo/dopa/milrinone Tolerated TC x2 hours and PS x2 hours today but then failed due to increased WOB.  VITAL SIGNS: Temp:  [98.2 F (36.8 C)-99.2 F (37.3 C)] 98.6 F (37 C) (04/28 1222) Pulse Rate:  [91-113] 93 (04/28 1230) Resp:  [16-35] 28 (04/28 1230) BP: (84-127)/(42-68) 100/47 mmHg (04/28 1222) SpO2:  [93 %-100 %] 95 % (04/28 1230) FiO2 (%):  [40 %-60 %] 40 % (04/28 1222) Weight:  [72.2 kg (159 lb 2.8 oz)] 72.2 kg (159 lb 2.8 oz) (04/28 0545) HEMODYNAMICS: CVP:  [9 mmHg-10 mmHg] 10 mmHg VENTILATOR SETTINGS: Vent Mode:  [-] PRVC FiO2 (%):  [40 %-60 %] 40 % Set Rate:  [16 bmp] 16 bmp Vt Set:  [600 mL] 600 mL PEEP:  [5 cmH20] 5 cmH20 Pressure Support:  [8 cmH20] 8 cmH20 Plateau Pressure:  [19 cmH20-26 cmH20] 26 cmH20 INTAKE / OUTPUT: Intake/Output      04/27 0701 - 04/28 0700 04/28 0701 - 04/29 0700   P.O. 7    I.V. (mL/kg) 1526.2 (21.1) 251.1 (3.5)   NG/GT 680 150   IV Piggyback 600  150   Total Intake(mL/kg) 2813.2 (39) 551.1 (7.6)   Urine (mL/kg/hr) 3340 (1.9) 1125 (2.8)   Emesis/NG output 0 (0)    Other     Stool     Blood     Chest Tube 0 (0)    Total Output 3340 1125   Net -526.8 -573.9        Emesis Occurrence 3 x     PHYSICAL EXAMINATION: General: no distress, trach in place, up in a chair, on vent. Neuro: Awake, tracking and following commands HEENT: [place]/AT, PERRL, EOM-I and MMM Cardiovascular: regular, tachycardic Lungs: scattered faint crackles Abdomen: soft, non tender  Musculoskeletal: 1+ edema Skin: no rashes  LABS:  CBC  Recent Labs Lab 09/26/14 0410 09/27/14 0500 09/27/14 1600 09/28/14 0348  WBC 14.8* 16.1*  --  13.3*  HGB 11.1* 11.1* 13.6 10.4*  HCT 32.6* 33.3* 40.0 31.0*  PLT 202 230  --  284     Coag's No results for input(s): APTT, INR in the last 168 hours.  BMET  Recent Labs Lab 09/26/14 0410 09/27/14 0500 09/27/14 1600 09/28/14 0348  NA 132* 131* 133* 133*  K 4.5 5.0 4.5 3.6  CL 98 98 97 96  CO2 25 24  --  24  BUN 34* 36* 35* 39*  CREATININE 1.76* 1.74* 1.80* 1.99*  GLUCOSE 139* 119* 110* 127*  Electrolytes  Recent Labs Lab 09/26/14 0410 09/27/14 0500 09/28/14 0348  CALCIUM 7.8* 8.1* 8.0*  MG 1.6  --  1.7  PHOS 2.2*  --  4.5     Sepsis Markers  Recent Labs Lab 09/27/14 0500 09/28/14 0348  PROCALCITON 0.33 0.40   ABG  Recent Labs Lab 09/25/14 0341 09/26/14 0425 09/27/14 0412  PHART 7.459* 7.470* 7.425  PCO2ART 37.5 37.7 39.0  PO2ART 76.4* 89.0 72.4*   Liver Enzymes  Recent Labs Lab 09/26/14 0410 09/27/14 0500 09/28/14 0348  AST 120* 96* 115*  ALT 74* 69* 72*  ALKPHOS 381* 365* 485*  BILITOT 5.2* 4.2* 3.3*  ALBUMIN 1.8* 1.8* 2.3*   Cardiac Enzymes No results for input(s): TROPONINI, PROBNP in the last 168 hours.   Glucose  Recent Labs Lab 09/27/14 1208 09/27/14 1910 09/27/14 2342 09/28/14 0337 09/28/14 0906 09/28/14 1233  GLUCAP 99 106* 160* 119* 133* 123*    Imaging Dg Chest Port 1 View  09/27/2014   CLINICAL DATA:  Shortness of breath, pulmonary edema.  EXAM: PORTABLE CHEST - 1 VIEW  COMPARISON:  09/26/2014  FINDINGS: Prior CABG. Heart is normal size. Tracheostomy tube in place, new since prior study. Left chest tube remains in place. No pneumothorax. Heart is normal size. Layering bilateral pleural effusions with bilateral lower lobe airspace opacities, unchanged.  IMPRESSION: New tracheostomy tube. No pneumothorax. Otherwise no significant change.   Electronically Signed   By: Charlett Nose M.D.   On: 09/27/2014 08:19   Dg Abd Portable 1v  09/27/2014   CLINICAL DATA:  Panda feeding tube placement.  EXAM: PORTABLE ABDOMEN - 1 VIEW  COMPARISON:  09/22/2014  FINDINGS: The feeding tube projects over the stomach antrum.  Distended stomach noted. Prominent stool throughout the colon favors constipation.  IMPRESSION: 1. Panda feeding tube tip:  Stomach antrum. 2. Stomach is mildly distended with gas. 3.  Prominent stool throughout the colon favors constipation.   Electronically Signed   By: Gaylyn Rong M.D.   On: 09/27/2014 16:34   ASSESSMENT / PLAN:  CARDIOVASCULAR A:  STEMI s/p emergent CABG and PFO closure 4/13. Acute systolic CHF - EF 25-30% Cardiogenic shock. A fib with RVR. Hx of HTN, HLD. P:  - Continue pressors and titrate as able - epi off - Titrate for SBP of 90. - Recommend holding further diureses at this point given renal function and hemodynamics but will defer to HF team. - TCTS following and ordered hydration. - Cont amiodarone, milrinone gtt per cards  - Midodrine as ordered.  PULMONARY ETT 4/13 >>4/26 Trach (CVTS) 4/26>>> L chest tube 4/26>>> A: Prolonged VDRF Pleural effusion  P:   - No further weaning for today. - PS then TC again in AM, goal of 4 hours of TC in AM. - Follow CXR, ABG - Titrate O2 for sats.  RENAL A:   Hyponatremia Hypokalemia. AKI Cr stable, good UOP  P:   - Monitor BMET  intermittently - Monitor I/Os - Correct electrolytes as indicated - Recommend holding further diureses.  GASTROINTESTINAL A:   Hx of GERD Hyperbilirubinemia P:   - Cont SUP - GI service following for hyperbilirubinemia - TF as tol   HEMATOLOGIC A:   Mild anemia without acute blood loss Thrombocytopenia, improving P:  - DVT px: LMWH - Monitor CBC intermittently - Transfuse per usual ICU guidelines  INFECTIOUS A:   Fever, unclear source Leukocytosis  P:   - Ceftaz 4/20>>> - Vanc 4/26>>>  - Abx per TCTS - PCT =  0.33  ENDOCRINE A:   Hyperglycemia. P:   - Cont SSI   NEUROLOGIC A:   Acute encephalopathy, resolved Hx of Anxiety / Depression. P:   - RASS goal 0 - PRN morphine   The patient is critically ill with multiple organ systems failure and requires high complexity decision making for assessment and support, frequent evaluation and titration of therapies, application of advanced monitoring technologies and extensive interpretation of multiple databases.   Critical Care Time devoted to patient care services described in this note is  35  Minutes. This time reflects time of care of this signee Dr Koren Bound. This critical care time does not reflect procedure time, or teaching time or supervisory time of PA/NP/Med student/Med Resident etc but could involve care discussion time.  Alyson Reedy, M.D. [facility] Pulmonary/Critical Care Medicine. Pager: [phone]. After hours pager: [phone].

## 2014-09-28 NOTE — Progress Notes (Signed)
**Note De-Identified  Obfuscation** Patient tolerating ATC at 60%.  RRT to continue monitoring.

## 2014-09-28 NOTE — Progress Notes (Signed)
Pt's trach is in proper position/alignment. Cuff pressure 30cmH20. No cuff leak noted. No air trapping noted.

## 2014-09-28 NOTE — Progress Notes (Signed)
TCTS DAILY ICU PROGRESS NOTE                   [address]            [place] [id number]          [phone]   2 Days Post-Op Procedure(s) (LRB): TRACHEOSTOMY (N/A) CHEST TUBE INSERTION (Left)  Total Length of Stay:  LOS: 15 days   Subjective: Very alert this am  Objective: Vital signs in last 24 hours: Temp:  [97.8 F (36.6 C)-99.2 F (37.3 C)] 99 F (37.2 C) (04/28 0356) Pulse Rate:  [87-113] 96 (04/28 0715) Cardiac Rhythm:  [-] Normal sinus rhythm (04/28 0700) Resp:  [16-35] 28 (04/28 0715) BP: (74-127)/(42-68) 127/51 mmHg (04/28 0715) SpO2:  [96 %-100 %] 100 % (04/28 0715) FiO2 (%):  [40 %] 40 % (04/28 0715) Weight:  [159 lb 2.8 oz (72.2 kg)] 159 lb 2.8 oz (72.2 kg) (04/28 0545)   Vent Mode:  [-] PSV;CPAP FiO2 (%):  [40 %] 40 % Set Rate:  [16 bmp] 16 bmp Vt Set:  [600 mL] 600 mL PEEP:  [5 cmH20] 5 cmH20 Pressure Support:  [8 cmH20-10 cmH20] 8 cmH20 Plateau Pressure:  [19 cmH20-26 cmH20] 26 cmH20  Filed Weights   09/26/14 0500 09/27/14 0500 09/28/14 0545  Weight: 166 lb 7.2 oz (75.5 kg) 159 lb 13.3 oz (72.5 kg) 159 lb 2.8 oz (72.2 kg)    Weight change: -10.6 oz (-0.3 kg)   Hemodynamic parameters for last 24 hours: CVP:  [8 mmHg-10 mmHg] 10 mmHg  Intake/Output from previous day: 04/27 0701 - 04/28 0700 In: 2813.2 [P.O.:7; I.V.:1526.2; NG/GT:680; IV Piggyback:600] Out: 3340 [Urine:3340]  Intake/Output this shift: Total I/O In: -  Out: 125 [Urine:125]  Current Meds: Scheduled Meds: . acetaZOLAMIDE  500 mg Per Tube BID  . albumin human  12.5 g Intravenous Q6H  . amiodarone  150 mg Intravenous Once  . antiseptic oral rinse  7 mL Mouth Rinse QID  . aspirin EC  325 mg Oral Daily   Or  . aspirin  324 mg Per Tube Daily  . atorvastatin  40 mg Oral q1800  . bisacodyl  10 mg Oral Daily   Or  . bisacodyl  10 mg Rectal Daily  . budesonide (PULMICORT) nebulizer solution  0.5 mg Nebulization BID  . cefTAZidime (FORTAZ)  IV  1 g  Intravenous Q12H  . chlorhexidine  15 mL Mouth Rinse BID  . digoxin  0.125 mg Per Tube QODAY  . enoxaparin (LOVENOX) injection  30 mg Subcutaneous Daily  . feeding supplement (VITAL 1.5 CAL)  1,000 mL Per Tube Q24H  . insulin aspart  0-9 Units Subcutaneous 6 times per day  . insulin detemir  15 Units Subcutaneous BID  . lidocaine-prilocaine  1 application Topical Once  . midodrine  10 mg Oral TID WC  . oxymetazoline  2 spray Each Nare Once  . pantoprazole sodium  40 mg Per Tube Daily  . potassium chloride  10 mEq Intravenous Q1 Hr x 3  . QUEtiapine  50 mg Oral QHS  . sodium chloride  10-40 mL Intracatheter Q12H  . torsemide  40 mg Per Tube BID  . vancomycin  750 mg Intravenous Q24H   Continuous Infusions: . sodium chloride 50 mL/hr at 09/28/14 0600  . sodium chloride 250 mL (09/22/14 0600)  . amiodarone 30 mg/hr (09/28/14 0629)  . dexmedetomidine 0.2 mcg/kg/hr (09/27/14 1000)  . DOPamine 5 mcg/kg/min (09/28/14  0700)  . EPINEPHrine 4 mg in dextrose 5% 250 mL infusion (16 mcg/mL) Stopped (09/28/14 0530)  . lactated ringers 10 mL/hr at 09/22/14 2257  . midazolam (VERSED) infusion 2 mg/hr (09/28/14 0700)  . milrinone 0.125 mcg/kg/min (09/28/14 0700)  . norepinephrine (LEVOPHED) Adult infusion 18 mcg/min (09/28/14 0743)   PRN Meds:.sodium chloride, Place/Maintain arterial line **AND** sodium chloride, ipratropium-albuterol, levalbuterol, midazolam, midazolam, morphine injection, ondansetron (ZOFRAN) IV, oxyCODONE, sodium chloride, traMADol  General appearance: alert and no distress Heart: regular rate and rhythm Lungs: clear anteriorly Abdomen: benign Extremities: + edema Wound: incis healing wellevh site slightly erethematous  Lab Results: CBC: Recent Labs  09/27/14 0500 09/27/14 1600 09/28/14 0348  WBC 16.1*  --  13.3*  HGB 11.1* 13.6 10.4*  HCT 33.3* 40.0 31.0*  PLT 230  --  284   BMET:  Recent Labs  09/27/14 0500 09/27/14 1600 09/28/14 0348  NA 131* 133* 133*    K 5.0 4.5 3.6  CL 98 97 96  CO2 24  --  24  GLUCOSE 119* 110* 127*  BUN 36* 35* 39*  CREATININE 1.74* 1.80* 1.99*  CALCIUM 8.1*  --  8.0*    PT/INR: No results for input(s): LABPROT, INR in the last 72 hours. Radiology: Dg Chest Port 1 View  09/27/2014   CLINICAL DATA:  Shortness of breath, pulmonary edema.  EXAM: PORTABLE CHEST - 1 VIEW  COMPARISON:  09/26/2014  FINDINGS: Prior CABG. Heart is normal size. Tracheostomy tube in place, new since prior study. Left chest tube remains in place. No pneumothorax. Heart is normal size. Layering bilateral pleural effusions with bilateral lower lobe airspace opacities, unchanged.  IMPRESSION: New tracheostomy tube. No pneumothorax. Otherwise no significant change.   Electronically Signed   By: Charlett Nose M.D.   On: 09/27/2014 08:19   Dg Abd Portable 1v  09/27/2014   CLINICAL DATA:  Panda feeding tube placement.  EXAM: PORTABLE ABDOMEN - 1 VIEW  COMPARISON:  09/22/2014  FINDINGS: The feeding tube projects over the stomach antrum.  Distended stomach noted. Prominent stool throughout the colon favors constipation.  IMPRESSION: 1. Panda feeding tube tip:  Stomach antrum. 2. Stomach is mildly distended with gas. 3.  Prominent stool throughout the colon favors constipation.   Electronically Signed   By: Gaylyn Rong M.D.   On: 09/27/2014 16:34     Assessment/Plan: S/P Procedure(s) (LRB): TRACHEOSTOMY (N/A) CHEST TUBE INSERTION (Left)  1 remains on full vent with significant inotropic support- cont to wean as tolerated 2 creat is slowly rising- UO is good, volume overload is improving 3 CCM and CHF team cont to assist with management    GOLD,WAYNE E 09/28/2014 8:03 AM

## 2014-09-29 ENCOUNTER — Inpatient Hospital Stay (HOSPITAL_COMMUNITY): Payer: Medicare Other

## 2014-09-29 LAB — COMPREHENSIVE METABOLIC PANEL
ALT: 49 U/L (ref 0–53)
AST: 76 U/L — ABNORMAL HIGH (ref 0–37)
Albumin: 2.4 g/dL — ABNORMAL LOW (ref 3.5–5.2)
Alkaline Phosphatase: 300 U/L — ABNORMAL HIGH (ref 39–117)
Anion gap: 9 (ref 5–15)
BUN: 37 mg/dL — ABNORMAL HIGH (ref 6–23)
CO2: 23 mmol/L (ref 19–32)
Calcium: 7.4 mg/dL — ABNORMAL LOW (ref 8.4–10.5)
Chloride: 105 mmol/L (ref 96–112)
Creatinine, Ser: 1.95 mg/dL — ABNORMAL HIGH (ref 0.50–1.35)
GFR calc Af Amer: 38 mL/min — ABNORMAL LOW (ref >90)
GFR calc non Af Amer: 33 mL/min — ABNORMAL LOW (ref >90)
Glucose, Bld: 122 mg/dL — ABNORMAL HIGH (ref 70–99)
Potassium: 2.6 mmol/L — CL (ref 3.5–5.1)
Sodium: 137 mmol/L (ref 135–145)
Total Bilirubin: 2.7 mg/dL — ABNORMAL HIGH (ref 0.3–1.2)
Total Protein: 4.8 g/dL — ABNORMAL LOW (ref 6.0–8.3)

## 2014-09-29 LAB — CBC WITH DIFFERENTIAL/PLATELET
Basophils Absolute: 0 10*3/uL (ref 0.0–0.1)
Basophils Relative: 0 % (ref 0–1)
Eosinophils Absolute: 0 10*3/uL (ref 0.0–0.7)
Eosinophils Relative: 0 % (ref 0–5)
HCT: 27.9 % — ABNORMAL LOW (ref 39.0–52.0)
Hemoglobin: 9.1 g/dL — ABNORMAL LOW (ref 13.0–17.0)
Lymphocytes Relative: 3 % — ABNORMAL LOW (ref 12–46)
Lymphs Abs: 0.3 10*3/uL — ABNORMAL LOW (ref 0.7–4.0)
MCH: 29.7 pg (ref 26.0–34.0)
MCHC: 32.6 g/dL (ref 30.0–36.0)
MCV: 91.2 fL (ref 78.0–100.0)
Monocytes Absolute: 0.9 10*3/uL (ref 0.1–1.0)
Monocytes Relative: 8 % (ref 3–12)
Neutro Abs: 10.1 10*3/uL — ABNORMAL HIGH (ref 1.7–7.7)
Neutrophils Relative %: 89 % — ABNORMAL HIGH (ref 43–77)
Platelets: 286 10*3/uL (ref 150–400)
RBC: 3.06 MIL/uL — ABNORMAL LOW (ref 4.22–5.81)
RDW: 18.2 % — ABNORMAL HIGH (ref 11.5–15.5)
WBC: 11.4 10*3/uL — ABNORMAL HIGH (ref 4.0–10.5)

## 2014-09-29 LAB — POCT I-STAT, CHEM 8
BUN: 40 mg/dL — ABNORMAL HIGH (ref 6–23)
CALCIUM ION: 1.2 mmol/L (ref 1.13–1.30)
Chloride: 100 mmol/L (ref 96–112)
Creatinine, Ser: 2.1 mg/dL — ABNORMAL HIGH (ref 0.50–1.35)
GLUCOSE: 127 mg/dL — AB (ref 70–99)
HEMATOCRIT: 34 % — AB (ref 39.0–52.0)
Hemoglobin: 11.6 g/dL — ABNORMAL LOW (ref 13.0–17.0)
Potassium: 3.7 mmol/L (ref 3.5–5.1)
Sodium: 137 mmol/L (ref 135–145)
TCO2: 21 mmol/L (ref 0–100)

## 2014-09-29 LAB — CARBOXYHEMOGLOBIN
Carboxyhemoglobin: 1.7 % — ABNORMAL HIGH (ref 0.5–1.5)
METHEMOGLOBIN: 1.1 % (ref 0.0–1.5)
O2 Saturation: 71.6 %
TOTAL HEMOGLOBIN: 8.9 g/dL — AB (ref 13.5–18.0)

## 2014-09-29 LAB — GLUCOSE, CAPILLARY
GLUCOSE-CAPILLARY: 115 mg/dL — AB (ref 70–99)
GLUCOSE-CAPILLARY: 113 mg/dL — AB (ref 70–99)
Glucose-Capillary: 99 mg/dL (ref 70–99)

## 2014-09-29 LAB — DIGOXIN LEVEL: Digoxin Level: 0.3 ng/mL — ABNORMAL LOW (ref 0.8–2.0)

## 2014-09-29 MED ORDER — MIDAZOLAM HCL 2 MG/2ML IJ SOLN: 1.0000 mg | Freq: Four times a day (QID) | INTRAMUSCULAR | Status: DC | PRN

## 2014-09-29 MED ORDER — POTASSIUM CHLORIDE 20 MEQ/15ML (10%) PO SOLN
40.0000 meq | Freq: Once | ORAL | Status: AC
  Administered 2014-09-29: 40 meq via ORAL

## 2014-09-29 MED ORDER — NICOTINE 14 MG/24HR TD PT24
14.0000 mg | MEDICATED_PATCH | Freq: Every day | TRANSDERMAL | Status: DC
  Administered 2014-09-29 – 2014-10-05 (×7): 14 mg via TRANSDERMAL
  Filled 2014-09-29 (×7): qty 1

## 2014-09-29 MED ORDER — BISACODYL 10 MG RE SUPP: 10.0000 mg | Freq: Once | RECTAL | Status: AC

## 2014-09-29 MED ORDER — OXYCODONE HCL 5 MG/5ML PO SOLN
5.0000 mg | ORAL | Status: DC | PRN
  Administered 2014-10-02 – 2014-10-05 (×13): 5 mg via ORAL
  Filled 2014-09-29 (×14): qty 5

## 2014-09-29 MED ORDER — POTASSIUM CHLORIDE 10 MEQ/50ML IV SOLN
10.0000 meq | INTRAVENOUS | Status: AC
  Administered 2014-09-29: 10 meq via INTRAVENOUS

## 2014-09-29 MED ORDER — POTASSIUM CHLORIDE 10 MEQ/50ML IV SOLN
10.0000 meq | INTRAVENOUS | Status: AC
Start: 2014-09-29 — End: 2014-09-29
  Administered 2014-09-29 (×3): 10 meq via INTRAVENOUS
  Filled 2014-09-29: qty 50

## 2014-09-29 MED ORDER — ACETAMINOPHEN 160 MG/5ML PO SOLN: 325.0000 mg | ORAL | Status: DC | PRN

## 2014-09-29 MED ORDER — POTASSIUM CHLORIDE 10 MEQ/50ML IV SOLN
10.0000 meq | INTRAVENOUS | Status: AC
  Administered 2014-09-29: 10 meq via INTRAVENOUS
  Filled 2014-09-29: qty 50

## 2014-09-29 MED ORDER — ALPRAZOLAM 0.5 MG PO TABS
0.5000 mg | ORAL_TABLET | Freq: Two times a day (BID) | ORAL | Status: DC
  Administered 2014-09-29 – 2014-10-02 (×7): 0.5 mg via ORAL
  Filled 2014-09-29 (×7): qty 1

## 2014-09-29 MED ORDER — POTASSIUM CHLORIDE 20 MEQ/15ML (10%) PO SOLN
40.0000 meq | Freq: Every day | ORAL | Status: DC
  Filled 2014-09-29: qty 30

## 2014-09-29 NOTE — Progress Notes (Signed)
Advanced Heart Failure Rounding Note   Subjective:     Impella removed 4/20. Swan out 4/24. Underwent trach and left chest tube 4/26  On trach. Awake following commands.   Demadex and acetazolamide stopped yesterday. Continues to diurese. Creatinine slightly better. Midodrine at 10 tid. Levophed down to 4. Epi off. . Co-ox 72%. CVP 6 K + 2.6. Dig 0.3  Maintaining SR on amio    Objective:   Weight Range:  Vital Signs:   Temp:  [98.4 F (36.9 C)-99 F (37.2 C)] 99 F (37.2 C) (04/29 0739) Pulse Rate:  [88-96] 90 (04/29 0809) Resp:  [16-38] 31 (04/29 0809) BP: (100-120)/(45-49) 102/45 mmHg (04/29 0809) SpO2:  [88 %-100 %] 96 % (04/29 0809) FiO2 (%):  [40 %-60 %] 40 % (04/29 0809) Weight:  [70.5 kg (155 lb 6.8 oz)] 70.5 kg (155 lb 6.8 oz) (04/29 0600)    Weight change: Filed Weights   09/27/14 0500 09/28/14 0545 09/29/14 0600  Weight: 72.5 kg (159 lb 13.3 oz) 72.2 kg (159 lb 2.8 oz) 70.5 kg (155 lb 6.8 oz)    Intake/Output:   Intake/Output Summary (Last 24 hours) at 09/29/14 0920 Last data filed at 09/29/14 0800  Gross per 24 hour  Intake 2993.5 ml  Output   3111 ml  Net -117.5 ml     Physical Exam: General:  On trach  awake HEENT: ETT. Scleral icterus.  Neck: supple.Carotids 2+ bilat; no bruits. No lymphadenopathy or thryomegaly appreciated. Cor: Sternal dressing. +CTs PMI nondisplaced. Regular rate & rhythm. Lungs: clear Abdomen: soft, nontender, + distended. No hepatosplenomegaly. No bruits or masses. Hypoactive bowel sounds. Extremities: no cyanosis, clubbing, rash, 1+ edema Neuro: alert & orientedx3, cranial nerves grossly intact. moves all 4 extremities w/o difficulty. Affect pleasant  Telemetry: NSR  Labs: Basic Metabolic Panel:  Recent Labs Lab 09/25/14 0355 09/26/14 0410 09/27/14 0500 09/27/14 1600 09/28/14 0348 09/28/14 1638 09/29/14 0424  NA 133* 132* 131* 133* 133* 134* 137  K 4.2 4.5 5.0 4.5 3.6 3.1* 2.6*  CL 99 98 98 97 96 98 105   CO2 --  24  --  23  GLUCOSE 127* 139* 119* 110* 127* 125* 122*  BUN 27* 34* 36* 35* 39* 37* 37*  CREATININE 1.72* 1.76* 1.74* 1.80* 1.99* 2.00* 1.95*  CALCIUM 7.8* 7.8* 8.1*  --  8.0*  --  7.4*  MG  --  1.6  --   --  1.7  --   --   PHOS  --  2.2*  --   --  4.5  --   --     Liver Function Tests:  Recent Labs Lab 09/25/14 0355 09/26/14 0410 09/27/14 0500 09/28/14 0348 09/29/14 0424  AST 137* 120* 96* 115* 76*  ALT 68* 74* 69* 72* 49  ALKPHOS 339* 381* 365* 485* 300*  BILITOT 7.7* 5.2* 4.2* 3.3* 2.7*  PROT 4.8* 4.8* 5.1* 5.6* 4.8*  ALBUMIN 2.0* 1.8* 1.8* 2.3* 2.4*   No results for input(s): LIPASE, AMYLASE in the last 168 hours. No results for input(s): AMMONIA in the last 168 hours.  CBC:  Recent Labs Lab 09/25/14 0355 09/26/14 0410 09/27/14 0500 09/27/14 1600 09/28/14 0348 09/28/14 1638 09/29/14 0424  WBC 14.7* 14.8* 16.1*  --  13.3*  --  11.4*  NEUTROABS 13.5* 13.3* 14.8*  --  11.8*  --  10.1*  HGB 10.9* 11.1* 11.1* 13.6 10.4* 11.2* 9.1*  HCT 31.0* 32.6* 33.3* 40.0 31.0* 33.0* 27.9*  MCV 86.4 87.6  87.9  --  89.6  --  91.2  PLT 176 202 230  --  284  --  286    Cardiac Enzymes: No results for input(s): CKTOTAL, CKMB, CKMBINDEX, TROPONINI in the last 168 hours.  BNP: BNP (last 3 results)  Recent Labs  09/13/14 1310  BNP 96.8    ProBNP (last 3 results) No results for input(s): PROBNP in the last 8760 hours.    Other results:  Imaging: Dg Chest Port 1 View  09/28/2014   CLINICAL DATA:  Shortness of breath  EXAM: PORTABLE CHEST - 1 VIEW  COMPARISON:  09/27/2014  FINDINGS: Tracheostomy, left chest tube and right PICC line remain in place, unchanged. No pneumothorax. Layering bilateral effusions are again noted with lower lobe atelectasis. Heart is normal size. Prior CABG.  IMPRESSION: Support devices are stable.  Stable layering effusions.   Electronically Signed   By: Charlett NoseKevin  Dover M.D.   On: 09/28/2014 08:08   Dg Abd Portable 1v  09/28/2014    CLINICAL DATA:  Postoperative ileus.  Vomiting.  EXAM: PORTABLE ABDOMEN - 1 VIEW  COMPARISON:  09/27/2014  FINDINGS: Feeding tube tip is in the distal stomach. There is decreased gaseous distention of the stomach. Is increased air in the colon with decreased air in the small bowel.  COPD.  Left chest tube.  IMPRESSION: Overall, slight decreased air in the bowel. Decreased gaseous distention of the stomach.   Electronically Signed   By: Francene BoyersJames  Maxwell M.D.   On: 09/28/2014 08:09   Dg Abd Portable 1v  09/27/2014   CLINICAL DATA:  Panda feeding tube placement.  EXAM: PORTABLE ABDOMEN - 1 VIEW  COMPARISON:  09/22/2014  FINDINGS: The feeding tube projects over the stomach antrum.  Distended stomach noted. Prominent stool throughout the colon favors constipation.  IMPRESSION: 1. Panda feeding tube tip:  Stomach antrum. 2. Stomach is mildly distended with gas. 3.  Prominent stool throughout the colon favors constipation.   Electronically Signed   By: Gaylyn RongWalter  Liebkemann M.D.   On: 09/27/2014 16:34     Medications:     Scheduled Medications: . ALPRAZolam  0.5 mg Oral BID  . amiodarone  150 mg Intravenous Once  . antiseptic oral rinse  7 mL Mouth Rinse QID  . aspirin EC  325 mg Oral Daily   Or  . aspirin  324 mg Per Tube Daily  . atorvastatin  40 mg Oral q1800  . bisacodyl  10 mg Oral Daily   Or  . bisacodyl  10 mg Rectal Daily  . bisacodyl  10 mg Rectal Once  . budesonide (PULMICORT) nebulizer solution  0.5 mg Nebulization BID  . cefTAZidime (FORTAZ)  IV  1 g Intravenous Q12H  . chlorhexidine  15 mL Mouth Rinse BID  . digoxin  0.125 mg Per Tube QODAY  . enoxaparin (LOVENOX) injection  30 mg Subcutaneous Daily  . feeding supplement (VITAL 1.5 CAL)  1,000 mL Per Tube Q24H  . insulin aspart  0-9 Units Subcutaneous 6 times per day  . insulin detemir  15 Units Subcutaneous BID  . lidocaine-prilocaine  1 application Topical Once  . metoCLOPramide (REGLAN) injection  10 mg Intravenous 4 times per  day  . midodrine  10 mg Oral TID WC  . nicotine  14 mg Transdermal Daily  . oxymetazoline  2 spray Each Nare Once  . pantoprazole sodium  40 mg Per Tube Daily  . potassium chloride  10 mEq Intravenous Q1 Hr x 2  .  QUEtiapine  50 mg Oral QHS  . sodium chloride  10-40 mL Intracatheter Q12H  . vancomycin  750 mg Intravenous Q24H    Infusions: . sodium chloride 50 mL/hr at 09/28/14 0600  . sodium chloride 250 mL (09/28/14 1200)  . amiodarone 30 mg/hr (09/29/14 0800)  . DOPamine 5 mcg/kg/min (09/29/14 0800)  . EPINEPHrine 4 mg in dextrose 5% 250 mL infusion (16 mcg/mL) Stopped (09/28/14 0530)  . lactated ringers 10 mL/hr at 09/22/14 2257  . milrinone 0.125 mcg/kg/min (09/29/14 0800)  . norepinephrine (LEVOPHED) Adult infusion 4 mcg/min (09/29/14 0800)    PRN Medications: sodium chloride, [CANCELED] Place/Maintain arterial line **AND** sodium chloride, oxyCODONE **AND** acetaminophen, ipratropium-albuterol, levalbuterol, midazolam, midazolam, ondansetron (ZOFRAN) IV, sodium chloride, traMADol   Assessment:   1. Cardiogenic shock 2. Acute systolic HF EF 25-30% by TEE 4/15 3. Acute anterolateral STEMI    --Emergent CABG 4/13 4. Acute respiratory failure   --s/p trach 4/26 5. Thrombocytopenia 6. Acute renal failure, stage III 7. Hyponatremia/hyponkalemia 8. PAF with RVR 9. Hyperbilirubinemia  Plan/Discussion:    He is s/p trach.   Continues to improve slowly. Hold diuretics again today. Likely restart demadex in am. Levophed almost off. Continue to wean. Continue midodrine. Likely will need Select prior to going home. Maintaining NSR on amio. Switch to po when ileus improves.   Supp K+   Length of Stay: 16 Muhamad Serano MD  09/29/2014, 9:20 AM  Advanced Heart Failure Team Pager [phone] (M-F; 7a - 4p)  Please contact [facility] for night-coverage after hours (4p -7a ) and weekends on [email or web address]

## 2014-09-29 NOTE — Progress Notes (Addendum)
Wasted 15ml of Versed in trash can. Witnessed by Burna CashEric Jakson Delpilar RN.  Thresa RossA. Haggard RN

## 2014-09-29 NOTE — Progress Notes (Signed)
CRITICAL VALUE ALERT  Critical value received:  K 2.6  Date of notification:  09/29/14  Time of notification:  0500  Critical value read back: yes   Nurse who received alert:  A. Haggard RN   Will start replacement per TCTS protocol, and relay lab value to day RN.

## 2014-09-29 NOTE — Progress Notes (Addendum)
3 Days Post-Op Procedure(s) (LRB): TRACHEOSTOMY (N/A) CHEST TUBE INSERTION (Left) Subjective: progressing after emerg CABG with LVAD for MI, shock VDRF now starting trach collar Weaning norepi with good co-ox Objective: Vital signs in last 24 hours: Temp:  [98.4 F (36.9 C)-99 F (37.2 C)] 99 F (37.2 C) (04/29 0739) Pulse Rate:  [88-97] 90 (04/29 0809) Cardiac Rhythm:  [-] Normal sinus rhythm (04/29 0800) Resp:  [16-38] 31 (04/29 0809) BP: (100-120)/(45-49) 102/45 mmHg (04/29 0809) SpO2:  [88 %-100 %] 96 % (04/29 0809) FiO2 (%):  [40 %-60 %] 40 % (04/29 0809) Weight:  [155 lb 6.8 oz (70.5 kg)] 155 lb 6.8 oz (70.5 kg) (04/29 0600)  Hemodynamic parameters for last 24 hours:  nsr  Intake/Output from previous day: 04/28 0701 - 04/29 0700 In: 3021.9 [I.V.:2041.9; NG/GT:180; IV Piggyback:800] Out: 3451 [Urine:3450; Stool:1] Intake/Output this shift: Total I/O In: 154 [I.V.:104; IV Piggyback:50] Out: 85 [Urine:85]  Incisions clean, dry Panda in plaxe Lab Results:  Recent Labs  09/28/14 0348 09/28/14 1638 09/29/14 0424  WBC 13.3*  --  11.4*  HGB 10.4* 11.2* 9.1*  HCT 31.0* 33.0* 27.9*  PLT 284  --  286   BMET:  Recent Labs  09/28/14 0348 09/28/14 1638 09/29/14 0424  NA 133* 134* 137  K 3.6 3.1* 2.6*  CL 96 98 105  CO2 24  --  23  GLUCOSE 127* 125* 122*  BUN 39* 37* 37*  CREATININE 1.99* 2.00* 1.95*  CALCIUM 8.0*  --  7.4*    PT/INR: No results for input(s): LABPROT, INR in the last 72 hours. ABG    Component Value Date/Time   PHART 7.425 09/27/2014 0412   HCO3 25.1* 09/27/2014 0412   TCO2 21 09/28/2014 1638   ACIDBASEDEF 2.0 09/19/2014 1208   O2SAT 71.6 09/29/2014 0350   CBG (last 3)   Recent Labs  09/28/14 1911 09/28/14 2335 09/29/14 0354  GLUCAP 95 104* 115*    Assessment/Plan: S/P Procedure(s) (LRB): TRACHEOSTOMY (N/A) CHEST TUBE INSERTION (Left) Mobilize Diuresis Continue ABX therapy due to Post-op infection     -aspiration  pneumonia DC L chest tube Trach collar trials Gastric ileus- check KUB, resume tube feeds when ready  LOS: 16 days    [name] 09/29/2014

## 2014-09-29 NOTE — Progress Notes (Signed)
Patient ID: Justin MattocksDennis W Symonds, male   DOB: 29-Dec-1940, 74 y.o.   MRN: [id number] EVENING ROUNDS NOTE :     [address]       [place] [id number]             [phone]                 3 Days Post-Op Procedure(s) (LRB): TRACHEOSTOMY (N/A) CHEST TUBE INSERTION (Left)  16 days postop cabg   Total Length of Stay:  LOS: 16 days  BP 97/41 mmHg  Pulse 87  Temp(Src) 98.2 F (36.8 C) (Oral)  Resp 24  Ht 5\' 7"  (1.702 m)  Wt 155 lb 6.8 oz (70.5 kg)  BMI 24.34 kg/m2  SpO2 99%  .Intake/Output      04/29 0701 - 04/30 0700   I.V. (mL/kg) 1239.4 (17.6)   NG/GT 260   IV Piggyback 150   Total Intake(mL/kg) 1649.4 (23.4)   Urine (mL/kg/hr) 600 (0.7)   Emesis/NG output 150 (0.2)   Stool    Chest Tube 0 (0)   Total Output 750   Net +899.4         . sodium chloride 50 mL/hr at 09/28/14 0600  . sodium chloride 1,000 mL (09/29/14 1419)  . amiodarone 30 mg/hr (09/29/14 1657)  . DOPamine 5 mcg/kg/min (09/29/14 1700)  . EPINEPHrine 4 mg in dextrose 5% 250 mL infusion (16 mcg/mL) Stopped (09/28/14 0530)  . lactated ringers 10 mL/hr at 09/22/14 2257  . milrinone 0.125 mcg/kg/min (09/29/14 1659)  . norepinephrine (LEVOPHED) Adult infusion 3 mcg/min (09/29/14 1600)     Lab Results  Component Value Date   WBC 11.4* 09/29/2014   HGB 11.6* 09/29/2014   HCT 34.0* 09/29/2014   PLT 286 09/29/2014   GLUCOSE 127* 09/29/2014   ALT 49 09/29/2014   AST 76* 09/29/2014   NA 137 09/29/2014   K 3.7 09/29/2014   CL 100 09/29/2014   CREATININE 2.10* 09/29/2014   BUN 40* 09/29/2014   CO2 23 09/29/2014   TSH 0.704 09/13/2014   INR 1.01 09/21/2014   HGBA1C 5.3 09/13/2014   Pulled ng tube after Dr PVT placed today Slowly improving  Now with trach  Delight OvensEdward B Raneshia Derick MD  Beeper [phone] Office [phone] 09/29/2014 7:42 PM

## 2014-09-29 NOTE — Progress Notes (Addendum)
ANTIBIOTIC CONSULT NOTE   Pharmacy Consult for vancomycin    Indication: rule out pneumonia and leukocytosis  Allergies  Allergen Reactions  . Penicillins Nausea And Vomiting    Patient Measurements: Height: 5\' 7"  (170.2 cm) Weight: 155 lb 6.8 oz (70.5 kg) IBW/kg (Calculated) : 66.1 Adjusted Body Weight: n/a  Vital Signs: Temp: 99 F (37.2 C) (04/29 0739) Temp Source: Oral (04/29 0739) BP: 102/45 mmHg (04/29 0809) Pulse Rate: 88 (04/29 1000) Intake/Output from previous day: 04/28 0701 - 04/29 0700 In: 3021.9 [I.V.:2041.9; NG/GT:180; IV Piggyback:800] Out: 3451 [Urine:3450; Stool:1] Intake/Output from this shift: Total I/O In: 154 [I.V.:104; IV Piggyback:50] Out: 85 [Urine:85]  Labs:  Recent Labs  09/27/14 0500  09/28/14 0348 09/28/14 1638 09/29/14 0424  WBC 16.1*  --  13.3*  --  11.4*  HGB 11.1*  < > 10.4* 11.2* 9.1*  PLT 230  --  284  --  286  CREATININE 1.74*  < > 1.99* 2.00* 1.95*  < > = values in this interval not displayed. Estimated Creatinine Clearance: 32.5 mL/min (by C-G formula based on Cr of 1.95). No results for input(s): VANCOTROUGH, VANCOPEAK, VANCORANDOM, GENTTROUGH, GENTPEAK, GENTRANDOM, TOBRATROUGH, TOBRAPEAK, TOBRARND, AMIKACINPEAK, AMIKACINTROU, AMIKACIN in the last 72 hours.   Microbiology: Recent Results (from the past 720 hour(s))  MRSA PCR Screening     Status: None   Collection Time: 09/13/14  4:51 PM  Result Value Ref Range Status   MRSA by PCR NEGATIVE NEGATIVE Final    Comment:        The GeneXpert MRSA Assay (FDA approved for NASAL specimens only), is one component of a comprehensive MRSA colonization surveillance program. It is not intended to diagnose MRSA infection nor to guide or monitor treatment for MRSA infections.   Culture, respiratory (NON-Expectorated)     Status: None   Collection Time: 09/21/14  6:48 AM  Result Value Ref Range Status   Specimen Description TRACHEAL ASPIRATE  Final   Special Requests Normal   Final   Gram Stain   Final    MODERATE WBC PRESENT, PREDOMINANTLY PMN RARE SQUAMOUS EPITHELIAL CELLS PRESENT NO ORGANISMS SEEN Performed at [facility]    Culture   Final    FEW YEAST CONSISTENT WITH CANDIDA SPECIES Performed at [facility]    Report Status 09/23/2014 FINAL  Final  Culture, blood (routine x 2)     Status: None (Preliminary result)   Collection Time: 09/25/14 12:55 PM  Result Value Ref Range Status   Specimen Description BLOOD LEFT HAND  Final   Special Requests BOTTLES DRAWN AEROBIC ONLY 3CC  Final   Culture   Final           BLOOD CULTURE RECEIVED NO GROWTH TO DATE CULTURE WILL BE HELD FOR 5 DAYS BEFORE ISSUING A FINAL NEGATIVE REPORT Performed at [facility]    Report Status PENDING  Incomplete  Culture, blood (routine x 2)     Status: None (Preliminary result)   Collection Time: 09/25/14  1:05 PM  Result Value Ref Range Status   Specimen Description BLOOD LEFT ANTECUBITAL  Final   Special Requests BOTTLES DRAWN AEROBIC ONLY 2CC  Final   Culture   Final           BLOOD CULTURE RECEIVED NO GROWTH TO DATE CULTURE WILL BE HELD FOR 5 DAYS BEFORE ISSUING A FINAL NEGATIVE REPORT Performed at [facility]    Report Status PENDING  Incomplete    Medical History: Past Medical  History  Diagnosis Date  . Dyspnea   . Palpitations   . Anxiety and depression   . GERD (gastroesophageal reflux disease)   . Hypertension   . Hypercholesteremia    Assessment: 74 yo male now off Impella, on vent with concern for pneumonia currently on day#6 of vancomycin and fortaz.   ON:[id number] pna -  Afeb. Wbc 11. Scr up today 1.9. May need to check VT tonight as last level was elevated. PCT 0.4  4/21 VT - 25.8 4/26 VT - 27.2, change 750 Q24 for calc'd VT 19-20  Fortaz 4/19>> Vanc 4/19 >> 4/21 resp cx - yeast with candida species 4/25 bld x2 - ngtd  Goal of Therapy:  Vancomycin trough level 15-20 mcg/ml  Plan:  1. Vanc 750 q24  hours-check level in am 2. Continue fortaz 1g q12 hour 3. F/u WBC, clinical course, culture data  Sheppard Coil PharmD., BCPS Clinical Pharmacist Pager [phone] 09/29/2014 10:47 AM

## 2014-09-30 ENCOUNTER — Inpatient Hospital Stay (HOSPITAL_COMMUNITY): Payer: Medicare Other

## 2014-09-30 LAB — COMPREHENSIVE METABOLIC PANEL
ALT: 57 U/L — ABNORMAL HIGH (ref 0–53)
AST: 81 U/L — ABNORMAL HIGH (ref 0–37)
Albumin: 2.4 g/dL — ABNORMAL LOW (ref 3.5–5.2)
Alkaline Phosphatase: 323 U/L — ABNORMAL HIGH (ref 39–117)
Anion gap: 11 (ref 5–15)
BUN: 44 mg/dL — ABNORMAL HIGH (ref 6–23)
CO2: 23 mmol/L (ref 19–32)
Calcium: 8.5 mg/dL (ref 8.4–10.5)
Chloride: 103 mmol/L (ref 96–112)
Creatinine, Ser: 2.17 mg/dL — ABNORMAL HIGH (ref 0.50–1.35)
GFR calc Af Amer: 33 mL/min — ABNORMAL LOW (ref >90)
GFR calc non Af Amer: 29 mL/min — ABNORMAL LOW (ref >90)
Glucose, Bld: 106 mg/dL — ABNORMAL HIGH (ref 70–99)
Potassium: 3.1 mmol/L — ABNORMAL LOW (ref 3.5–5.1)
Sodium: 137 mmol/L (ref 135–145)
Total Bilirubin: 3 mg/dL — ABNORMAL HIGH (ref 0.3–1.2)
Total Protein: 5.5 g/dL — ABNORMAL LOW (ref 6.0–8.3)

## 2014-09-30 LAB — CBC WITH DIFFERENTIAL/PLATELET
Basophils Absolute: 0 10*3/uL (ref 0.0–0.1)
Basophils Relative: 0 % (ref 0–1)
Eosinophils Absolute: 0 10*3/uL (ref 0.0–0.7)
Eosinophils Relative: 0 % (ref 0–5)
HCT: 29.4 % — ABNORMAL LOW (ref 39.0–52.0)
Hemoglobin: 9.5 g/dL — ABNORMAL LOW (ref 13.0–17.0)
Lymphocytes Relative: 4 % — ABNORMAL LOW (ref 12–46)
Lymphs Abs: 0.5 10*3/uL — ABNORMAL LOW (ref 0.7–4.0)
MCH: 29.5 pg (ref 26.0–34.0)
MCHC: 32.3 g/dL (ref 30.0–36.0)
MCV: 91.3 fL (ref 78.0–100.0)
Monocytes Absolute: 1.1 10*3/uL — ABNORMAL HIGH (ref 0.1–1.0)
Monocytes Relative: 9 % (ref 3–12)
Neutro Abs: 9.8 10*3/uL — ABNORMAL HIGH (ref 1.7–7.7)
Neutrophils Relative %: 86 % — ABNORMAL HIGH (ref 43–77)
Platelets: 326 10*3/uL (ref 150–400)
RBC: 3.22 MIL/uL — ABNORMAL LOW (ref 4.22–5.81)
RDW: 18.3 % — ABNORMAL HIGH (ref 11.5–15.5)
WBC: 11.3 10*3/uL — ABNORMAL HIGH (ref 4.0–10.5)

## 2014-09-30 LAB — VANCOMYCIN, TROUGH
Vancomycin Tr: 29.2 ug/mL (ref 10.0–20.0)
Vancomycin Tr: 24.5 ug/mL — ABNORMAL HIGH (ref 10.0–20.0)

## 2014-09-30 LAB — GLUCOSE, CAPILLARY
GLUCOSE-CAPILLARY: 136 mg/dL — AB (ref 70–99)
GLUCOSE-CAPILLARY: 88 mg/dL (ref 70–99)
Glucose-Capillary: 101 mg/dL — ABNORMAL HIGH (ref 70–99)
Glucose-Capillary: 93 mg/dL (ref 70–99)
Glucose-Capillary: 107 mg/dL — ABNORMAL HIGH (ref 70–99)
Glucose-Capillary: 122 mg/dL — ABNORMAL HIGH (ref 70–99)
Glucose-Capillary: 109 mg/dL — ABNORMAL HIGH (ref 70–99)
Glucose-Capillary: 107 mg/dL — ABNORMAL HIGH (ref 70–99)

## 2014-09-30 MED ORDER — POTASSIUM CHLORIDE 10 MEQ/50ML IV SOLN
10.0000 meq | INTRAVENOUS | Status: AC
  Administered 2014-09-30 (×3): 10 meq via INTRAVENOUS
  Filled 2014-09-30 (×3): qty 50

## 2014-09-30 MED ORDER — AMIODARONE HCL 200 MG PO TABS
200.0000 mg | ORAL_TABLET | Freq: Two times a day (BID) | ORAL | Status: DC
  Administered 2014-09-30 – 2014-10-05 (×11): 200 mg via ORAL
  Filled 2014-09-30 (×12): qty 1

## 2014-09-30 NOTE — Progress Notes (Signed)
ANTIBIOTIC CONSULT NOTE - FOLLOW UP  Pharmacy Consult for Vancomycin  Indication: rule out pneumonia   Labs:  Recent Labs  09/27/14 0500  09/28/14 0348 09/28/14 1638 09/29/14 0424 09/29/14 1555  WBC 16.1*  --  13.3*  --  11.4*  --   HGB 11.1*  < > 10.4* 11.2* 9.1* 11.6*  PLT 230  --  284  --  286  --   CREATININE 1.74*  < > 1.99* 2.00* 1.95* 2.10*  < > = values in this interval not displayed. Estimated Creatinine Clearance: 30.2 mL/min (by C-G formula based on Cr of 2.1).  Recent Labs  09/30/14 0125  VANCOTROUGH 29.2*    Assessment: Supra-therapeutic vancomycin trough (drawn correctly)  Goal of Therapy:  Vancomycin trough level 15-20 mcg/ml  Plan:  -Re-check random vancomycin level in ~12 hours to ensure clearance, re-dose as level allows  Abran DukeLedford, Treylan Mcclintock 09/30/2014,2:20 AM

## 2014-09-30 NOTE — Progress Notes (Signed)
Pt placed back on full support due to increased WOB, drop in O2 sats. RT will continue to monitor.

## 2014-09-30 NOTE — Progress Notes (Signed)
Pt placed back on full support on vent due to drop in O2 sats into the low 80's. RT lavaged pt and he coughed up very large mucus plug. Pt suctioned again returning small amount of thick, tan secretions. Pt placed back on 60% ATC. RT will continue to monitor.

## 2014-09-30 NOTE — Progress Notes (Signed)
Patient ID: Justin MattocksDennis W Adell, male   DOB: August 19, 1940, 74 y.o.   MRN: [id number] TCTS DAILY ICU PROGRESS NOTE                   [address]            [place] [id number]          [phone]   4 Days Post-Op Procedure(s) (LRB): TRACHEOSTOMY (N/A) CHEST TUBE INSERTION (Left)  17  Days Post-Op Procedure(s) (LRB): CORONARY ARTERY BYPASS GRAFTING (CABG), ON PUMP, TIMES THREE, USING LEFT INTERNAL MAMMARY ARTERY, LEFT GREATER SAPHENOUS VEIN HARVESTED ENDOSCOPICALLY (N/A) PATENT FORAMEN OVALE CLOSURE  Total Length of Stay:  LOS: 17 days   Subjective: On vent, desat and large mucous plug this am while on trach collar  Objective: Vital signs in last 24 hours: Temp:  [97.4 F (36.3 C)-99 F (37.2 C)] 98.7 F (37.1 C) (04/30 0750) Pulse Rate:  [82-93] 87 (04/30 0945) Cardiac Rhythm:  [-] Normal sinus rhythm (04/30 0800) Resp:  [17-37] 32 (04/30 0945) BP: (97-117)/(41-53) 117/53 mmHg (04/30 0900) SpO2:  [79 %-100 %] 95 % (04/30 0945) FiO2 (%):  [40 %] 40 % (04/30 0900)  Filed Weights   09/27/14 0500 09/28/14 0545 09/29/14 0600  Weight: 159 lb 13.3 oz (72.5 kg) 159 lb 2.8 oz (72.2 kg) 155 lb 6.8 oz (70.5 kg)    Weight change:    Hemodynamic parameters for last 24 hours:    Intake/Output from previous day: 04/29 0701 - 04/30 0700 In: 2931.9 [I.V.:2421.9; NG/GT:260; IV Piggyback:250] Out: 1570 [Urine:1320; Emesis/NG output:250]  Intake/Output this shift: Total I/O In: 277.2 [I.V.:127.2; NG/GT:50; IV Piggyback:100] Out: 205 [Urine:105; Emesis/NG output:100]  Current Meds: Scheduled Meds: . ALPRAZolam  0.5 mg Oral BID  . amiodarone  150 mg Intravenous Once  . antiseptic oral rinse  7 mL Mouth Rinse QID  . aspirin EC  325 mg Oral Daily   Or  . aspirin  324 mg Per Tube Daily  . atorvastatin  40 mg Oral q1800  . bisacodyl  10 mg Oral Daily   Or  . bisacodyl  10 mg Rectal Daily  . budesonide (PULMICORT) nebulizer solution  0.5 mg Nebulization BID  .  cefTAZidime (FORTAZ)  IV  1 g Intravenous Q12H  . chlorhexidine  15 mL Mouth Rinse BID  . digoxin  0.125 mg Per Tube QODAY  . enoxaparin (LOVENOX) injection  30 mg Subcutaneous Daily  . feeding supplement (VITAL 1.5 CAL)  1,000 mL Per Tube Q24H  . insulin aspart  0-9 Units Subcutaneous 6 times per day  . insulin detemir  15 Units Subcutaneous BID  . lidocaine-prilocaine  1 application Topical Once  . metoCLOPramide (REGLAN) injection  10 mg Intravenous 4 times per day  . midodrine  10 mg Oral TID WC  . nicotine  14 mg Transdermal Daily  . oxymetazoline  2 spray Each Nare Once  . pantoprazole sodium  40 mg Per Tube Daily  . QUEtiapine  50 mg Oral QHS  . sodium chloride  10-40 mL Intracatheter Q12H   Continuous Infusions: . sodium chloride 50 mL/hr at 09/28/14 0600  . sodium chloride 250 mL (09/30/14 0800)  . amiodarone 30 mg/hr (09/30/14 0800)  . DOPamine 5 mcg/kg/min (09/30/14 0800)  . EPINEPHrine 4 mg in dextrose 5% 250 mL infusion (16 mcg/mL) Stopped (09/28/14 0530)  . lactated ringers 10 mL/hr at 09/22/14 2257  . milrinone 0.125 mcg/kg/min (09/30/14 0800)  . norepinephrine (LEVOPHED) Adult  infusion 3 mcg/min (09/30/14 0945)   PRN Meds:.sodium chloride, [CANCELED] Place/Maintain arterial line **AND** sodium chloride, oxyCODONE **AND** acetaminophen, ipratropium-albuterol, levalbuterol, midazolam, ondansetron (ZOFRAN) IV, sodium chloride, traMADol  General appearance: alert, cooperative, appears older than stated age, no distress and slowed mentation Neurologic: intact Heart: regular rate and rhythm, S1, S2 normal, no murmur, click, rub or gallop Lungs: diminished breath sounds bilaterally Abdomen: soft, non-tender; bowel sounds normal; no masses,  no organomegaly Extremities: extremities normal, atraumatic, no cyanosis or edema and Homans sign is negative, no sign of DVT Wound: sternum stable  Lab Results: CBC: Recent Labs  09/29/14 0424 09/29/14 1555 09/30/14 0431  WBC  11.4*  --  11.3*  HGB 9.1* 11.6* 9.5*  HCT 27.9* 34.0* 29.4*  PLT 286  --  326   BMET:  Recent Labs  09/29/14 0424 09/29/14 1555 09/30/14 0431  NA 137 137 137  K 2.6* 3.7 3.1*  CL 105 100 103  CO2 23  --  23  GLUCOSE 122* 127* 106*  BUN 37* 40* 44*  CREATININE 1.95* 2.10* 2.17*  CALCIUM 7.4*  --  8.5    PT/INR: No results for input(s): LABPROT, INR in the last 72 hours. Radiology: Dg Chest Port 1 View  09/29/2014   CLINICAL DATA:  Chest tube removal  EXAM: PORTABLE CHEST - 1 VIEW  COMPARISON:  09/28/2014  FINDINGS: Interval removal of the left-sided chest tube. There is no pneumothorax or increased pleural fluid.  A right upper extremity PICC remains with tip at the SVC level. Tracheostomy tube is well seated. The feeding tube continues at least into the stomach.  Stable heart size and mediastinal contours post CABG.  Unchanged hazy appearance of the lower chest consistent with atelectasis and layering effusions.  IMPRESSION: 1. No pneumothorax after chest tube removal. 2. Remaining tubes and central line are in stable position. 3. Unchanged layering effusions.   Electronically Signed   By: Marnee Spring M.D.   On: 09/29/2014 09:29   Dg Abd Portable 1v  09/29/2014   CLINICAL DATA:  Ileus.  Abdominal pain.  EXAM: PORTABLE ABDOMEN - 1 VIEW  COMPARISON:  09/28/2014  FINDINGS: Feeding tube is partially looped on itself in the distal stomach. Mild-to-moderate gaseous distention of the stomach is slightly less than on the prior study. There is a small to moderate amount of stool throughout the colon. Small bowel gaseous distension on the prior study has resolved. Gas is present in scattered loops of decompressed small bowel in the abdomen on the current study. No acute osseous abnormality.  IMPRESSION: Interval resolution of gaseous bowel distention. Slightly decreased distention of the stomach.   Electronically Signed   By: Sebastian Ache   On: 09/29/2014 09:29     Assessment/Plan: S/P  Procedure(s) (LRB): TRACHEOSTOMY (N/A) CHEST TUBE INSERTION (Left) Continue ABX therapy due to Post-op infection  -aspiration pneumonia Trach collar trials Gastric ileus- check KUB, improved on kub, will d/c ng then resume tube feeds when ready    Delight Ovens 09/30/2014 10:22 AM

## 2014-09-30 NOTE — Progress Notes (Signed)
Advanced Heart Failure Rounding Note   Subjective:     Impella removed 4/20. Swan out 4/24. Underwent trach and left chest tube 4/26  On trach. Awake following commands. Large mucous plug on trach this am.  Demadex and acetazolamide off Continues to diurese. Creatinine up again. Midodrine at 10 tid. Levophed almost off (now at 4) Epi off. . Co-ox 72% yesterday   K + 3.1.   Maintaining SR on amio    Objective:   Weight Range:  Vital Signs:   Temp:  [98.2 F (36.8 C)-99 F (37.2 C)] 98.7 F (37.1 C) (04/30 0750) Pulse Rate:  [82-94] 94 (04/30 1130) Resp:  [17-37] 23 (04/30 1130) BP: (117)/(53) 117/53 mmHg (04/30 0900) SpO2:  [79 %-100 %] 97 % (04/30 1130) FiO2 (%):  [40 %] 40 % (04/30 1045)    Weight change: Filed Weights   09/27/14 0500 09/28/14 0545 09/29/14 0600  Weight: 72.5 kg (159 lb 13.3 oz) 72.2 kg (159 lb 2.8 oz) 70.5 kg (155 lb 6.8 oz)    Intake/Output:   Intake/Output Summary (Last 24 hours) at 09/30/14 1219 Last data filed at 09/30/14 1100  Gross per 24 hour  Intake 2586.28 ml  Output   1610 ml  Net 976.28 ml     Physical Exam: General:  On trach  awake HEENT: normal Neck: + trach supple. Cor: Sternal dressing. Regular rate & rhythm. Lungs: +rhonchi Abdomen: soft, nontender, + distended. No hepatosplenomegaly. No bruits or masses. Hypoactive bowel sounds. Extremities: no cyanosis, clubbing, rash, 1+ edema Neuro: alert & orientedx3, cranial nerves grossly intact. moves all 4 extremities w/o difficulty. Affect pleasant  Telemetry: NSR  Labs: Basic Metabolic Panel:  Recent Labs Lab 09/26/14 0410 09/27/14 0500  09/28/14 0348 09/28/14 1638 09/29/14 0424 09/29/14 1555 09/30/14 0431  NA 132* 131*  < > 133* 134* 137 137 137  K 4.5 5.0  < > 3.6 3.1* 2.6* 3.7 3.1*  CL 98 98  < > 96 98 105 100 103  CO2 25 24  --  24  --  23  --  23  GLUCOSE 139* 119*  < > 127* 125* 122* 127* 106*  BUN 34* 36*  < > 39* 37* 37* 40* 44*  CREATININE 1.76* 1.74*   < > 1.99* 2.00* 1.95* 2.10* 2.17*  CALCIUM 7.8* 8.1*  --  8.0*  --  7.4*  --  8.5  MG 1.6  --   --  1.7  --   --   --   --   PHOS 2.2*  --   --  4.5  --   --   --   --   < > = values in this interval not displayed.  Liver Function Tests:  Recent Labs Lab 09/26/14 0410 09/27/14 0500 09/28/14 0348 09/29/14 0424 09/30/14 0431  AST 120* 96* 115* 76* 81*  ALT 74* 69* 72* 49 57*  ALKPHOS 381* 365* 485* 300* 323*  BILITOT 5.2* 4.2* 3.3* 2.7* 3.0*  PROT 4.8* 5.1* 5.6* 4.8* 5.5*  ALBUMIN 1.8* 1.8* 2.3* 2.4* 2.4*   No results for input(s): LIPASE, AMYLASE in the last 168 hours. No results for input(s): AMMONIA in the last 168 hours.  CBC:  Recent Labs Lab 09/26/14 0410 09/27/14 0500  09/28/14 0348 09/28/14 1638 09/29/14 0424 09/29/14 1555 09/30/14 0431  WBC 14.8* 16.1*  --  13.3*  --  11.4*  --  11.3*  NEUTROABS 13.3* 14.8*  --  11.8*  --  10.1*  --  9.8*  HGB 11.1* 11.1*  < > 10.4* 11.2* 9.1* 11.6* 9.5*  HCT 32.6* 33.3*  < > 31.0* 33.0* 27.9* 34.0* 29.4*  MCV 87.6 87.9  --  89.6  --  91.2  --  91.3  PLT 202 230  --  284  --  286  --  326  < > = values in this interval not displayed.  Cardiac Enzymes: No results for input(s): CKTOTAL, CKMB, CKMBINDEX, TROPONINI in the last 168 hours.  BNP: BNP (last 3 results)  Recent Labs  09/13/14 1310  BNP 96.8    ProBNP (last 3 results) No results for input(s): PROBNP in the last 8760 hours.    Other results:  Imaging: Dg Chest Port 1 View  09/30/2014   CLINICAL DATA:  Shortness of breath.  Left-sided chest tube.  EXAM: PORTABLE CHEST - 1 VIEW  COMPARISON:  09/29/2014; 09/28/2014; 09/27/2014  FINDINGS: Grossly unchanged cardiac silhouette and mediastinal contours post median sternotomy and CABG. Stable position of support apparatus. Ill-defined heterogeneous/ consolidative opacities again noted about the peripheral aspect of the left upper/ mid lung at the site of the recently removed left-sided chest tube. No pneumothorax.  The lungs are hyperexpanded with flattening the bilaterally diaphragms. Grossly unchanged ill-defined heterogeneous opacities within the bilateral lung bases. Unchanged trace bilateral effusions. Unchanged bones.  IMPRESSION: 1.  Stable positioning of support apparatus.  No pneumothorax. 2. Similar findings of mild pulmonary edema and trace bilateral effusions superimposed on lung hyperexpansion.   Electronically Signed   By: Simonne Come M.D.   On: 09/30/2014 08:27   Dg Chest Port 1 View  09/29/2014   CLINICAL DATA:  Chest tube removal  EXAM: PORTABLE CHEST - 1 VIEW  COMPARISON:  09/28/2014  FINDINGS: Interval removal of the left-sided chest tube. There is no pneumothorax or increased pleural fluid.  A right upper extremity PICC remains with tip at the SVC level. Tracheostomy tube is well seated. The feeding tube continues at least into the stomach.  Stable heart size and mediastinal contours post CABG.  Unchanged hazy appearance of the lower chest consistent with atelectasis and layering effusions.  IMPRESSION: 1. No pneumothorax after chest tube removal. 2. Remaining tubes and central line are in stable position. 3. Unchanged layering effusions.   Electronically Signed   By: Marnee Spring M.D.   On: 09/29/2014 09:29   Dg Abd Portable 1v  09/30/2014   CLINICAL DATA:  NG tube  EXAM: PORTABLE ABDOMEN - 1 VIEW  COMPARISON:  09/29/2014; 09/28/2014  FINDINGS: Weighted tip enteric tube projects over the expected location of the horizontal segment of the duodenum. There is an additional enteric to whose tip and side port projects over the expected location of the gastric antrum. Epicardial leads overlie the caudal aspect of the heart.  Paucity of bowel gas without definite evidence of obstruction. No supine evidence of pneumoperitoneum.  IMPRESSION: 1. Weighted tip enteric tube overlies expected location of the horizontal segment of the duodenum. 2. Additional enteric tube tip and side port projects over the  expected location of the gastric antrum. 3. Paucity of bowel gas without evidence of obstruction.   Electronically Signed   By: Simonne Come M.D.   On: 09/30/2014 08:22   Dg Abd Portable 1v  09/29/2014   CLINICAL DATA:  Ileus.  Abdominal pain.  EXAM: PORTABLE ABDOMEN - 1 VIEW  COMPARISON:  09/28/2014  FINDINGS: Feeding tube is partially looped on itself in the distal stomach. Mild-to-moderate gaseous distention of the stomach  is slightly less than on the prior study. There is a small to moderate amount of stool throughout the colon. Small bowel gaseous distension on the prior study has resolved. Gas is present in scattered loops of decompressed small bowel in the abdomen on the current study. No acute osseous abnormality.  IMPRESSION: Interval resolution of gaseous bowel distention. Slightly decreased distention of the stomach.   Electronically Signed   By: Sebastian AcheAllen  Grady   On: 09/29/2014 09:29     Medications:     Scheduled Medications: . ALPRAZolam  0.5 mg Oral BID  . amiodarone  150 mg Intravenous Once  . antiseptic oral rinse  7 mL Mouth Rinse QID  . aspirin EC  325 mg Oral Daily   Or  . aspirin  324 mg Per Tube Daily  . atorvastatin  40 mg Oral q1800  . bisacodyl  10 mg Oral Daily   Or  . bisacodyl  10 mg Rectal Daily  . budesonide (PULMICORT) nebulizer solution  0.5 mg Nebulization BID  . cefTAZidime (FORTAZ)  IV  1 g Intravenous Q12H  . chlorhexidine  15 mL Mouth Rinse BID  . digoxin  0.125 mg Per Tube QODAY  . enoxaparin (LOVENOX) injection  30 mg Subcutaneous Daily  . feeding supplement (VITAL 1.5 CAL)  1,000 mL Per Tube Q24H  . insulin aspart  0-9 Units Subcutaneous 6 times per day  . insulin detemir  15 Units Subcutaneous BID  . lidocaine-prilocaine  1 application Topical Once  . metoCLOPramide (REGLAN) injection  10 mg Intravenous 4 times per day  . midodrine  10 mg Oral TID WC  . nicotine  14 mg Transdermal Daily  . oxymetazoline  2 spray Each Nare Once  . pantoprazole  sodium  40 mg Per Tube Daily  . QUEtiapine  50 mg Oral QHS  . sodium chloride  10-40 mL Intracatheter Q12H    Infusions: . sodium chloride 50 mL/hr at 09/28/14 0600  . sodium chloride 250 mL (09/30/14 0800)  . amiodarone 30 mg/hr (09/30/14 0800)  . DOPamine 5 mcg/kg/min (09/30/14 0800)  . EPINEPHrine 4 mg in dextrose 5% 250 mL infusion (16 mcg/mL) Stopped (09/28/14 0530)  . lactated ringers 10 mL/hr at 09/22/14 2257  . milrinone 0.125 mcg/kg/min (09/30/14 0800)  . norepinephrine (LEVOPHED) Adult infusion 7 mcg/min (09/30/14 1130)    PRN Medications: sodium chloride, [CANCELED] Place/Maintain arterial line **AND** sodium chloride, oxyCODONE **AND** acetaminophen, ipratropium-albuterol, levalbuterol, midazolam, ondansetron (ZOFRAN) IV, sodium chloride, traMADol   Assessment:   1. Cardiogenic shock 2. Acute systolic HF EF 25-30% by TEE 4/15 3. Acute anterolateral STEMI    --Emergent CABG 4/13 4. Acute respiratory failure   --s/p trach 4/26 5. Thrombocytopenia 6. Acute renal failure, stage III 7. Hyponatremia/hyponkalemia 8. PAF with RVR 9. Hyperbilirubinemia  Plan/Discussion:    He is s/p trach.   Continues to improve slowly. He is diuresing without diuretics. Renal function up slightly. Hold diuretics again today. Levophed almost off. Continue to wean. Continue midodrine. Likely will need Select prior to going home. Maintaining NSR on amio. Switch to po when ileus improves.   Supp K+   Length of Stay: 17 Arvilla Meresaniel Bensimhon MD  09/30/2014, 12:19 PM  Advanced Heart Failure Team Pager [phone] (M-F; 7a - 4p)  Please contact [facility] for night-coverage after hours (4p -7a ) and weekends on [email or web address]

## 2014-10-01 LAB — CBC WITH DIFFERENTIAL/PLATELET
Basophils Absolute: 0 10*3/uL (ref 0.0–0.1)
Basophils Relative: 0 % (ref 0–1)
Eosinophils Absolute: 0 10*3/uL (ref 0.0–0.7)
Eosinophils Relative: 0 % (ref 0–5)
HCT: 30.3 % — ABNORMAL LOW (ref 39.0–52.0)
Hemoglobin: 9.8 g/dL — ABNORMAL LOW (ref 13.0–17.0)
Lymphocytes Relative: 6 % — ABNORMAL LOW (ref 12–46)
Lymphs Abs: 0.7 10*3/uL (ref 0.7–4.0)
MCH: 29.3 pg (ref 26.0–34.0)
MCHC: 32.3 g/dL (ref 30.0–36.0)
MCV: 90.7 fL (ref 78.0–100.0)
Monocytes Absolute: 0.8 10*3/uL (ref 0.1–1.0)
Monocytes Relative: 7 % (ref 3–12)
Neutro Abs: 10.2 10*3/uL — ABNORMAL HIGH (ref 1.7–7.7)
Neutrophils Relative %: 87 % — ABNORMAL HIGH (ref 43–77)
Platelets: 355 10*3/uL (ref 150–400)
RBC: 3.34 MIL/uL — ABNORMAL LOW (ref 4.22–5.81)
RDW: 18.3 % — ABNORMAL HIGH (ref 11.5–15.5)
WBC: 11.7 10*3/uL — ABNORMAL HIGH (ref 4.0–10.5)

## 2014-10-01 LAB — GLUCOSE, CAPILLARY
GLUCOSE-CAPILLARY: 146 mg/dL — AB (ref 70–99)
GLUCOSE-CAPILLARY: 116 mg/dL — AB (ref 70–99)
Glucose-Capillary: 120 mg/dL — ABNORMAL HIGH (ref 70–99)
Glucose-Capillary: 185 mg/dL — ABNORMAL HIGH (ref 70–99)
Glucose-Capillary: 85 mg/dL (ref 70–99)

## 2014-10-01 LAB — COMPREHENSIVE METABOLIC PANEL
ALT: 55 U/L (ref 17–63)
AST: 70 U/L — ABNORMAL HIGH (ref 15–41)
Albumin: 2.3 g/dL — ABNORMAL LOW (ref 3.5–5.0)
Alkaline Phosphatase: 295 U/L — ABNORMAL HIGH (ref 38–126)
Anion gap: 10 (ref 5–15)
BUN: 45 mg/dL — ABNORMAL HIGH (ref 6–20)
CO2: 23 mmol/L (ref 22–32)
Calcium: 8.3 mg/dL — ABNORMAL LOW (ref 8.9–10.3)
Chloride: 106 mmol/L (ref 101–111)
Creatinine, Ser: 1.95 mg/dL — ABNORMAL HIGH (ref 0.61–1.24)
GFR calc Af Amer: 38 mL/min — ABNORMAL LOW (ref >60)
GFR calc non Af Amer: 33 mL/min — ABNORMAL LOW (ref >60)
Glucose, Bld: 148 mg/dL — ABNORMAL HIGH (ref 70–99)
Potassium: 2.9 mmol/L — ABNORMAL LOW (ref 3.5–5.1)
Sodium: 139 mmol/L (ref 135–145)
Total Bilirubin: 2.7 mg/dL — ABNORMAL HIGH (ref 0.3–1.2)
Total Protein: 5.4 g/dL — ABNORMAL LOW (ref 6.5–8.1)

## 2014-10-01 LAB — CULTURE, BLOOD (ROUTINE X 2)
CULTURE: NO GROWTH
CULTURE: NO GROWTH

## 2014-10-01 LAB — POCT I-STAT 3, ART BLOOD GAS (G3+)
Acid-base deficit: 3 mmol/L — ABNORMAL HIGH (ref 0.0–2.0)
Bicarbonate: 21.8 mEq/L (ref 20.0–24.0)
O2 Saturation: 97 %
PCO2 ART: 35.7 mmHg (ref 35.0–45.0)
PH ART: 7.395 (ref 7.350–7.450)
TCO2: 23 mmol/L (ref 0–100)
pO2, Arterial: 94 mmHg (ref 80.0–100.0)

## 2014-10-01 LAB — VANCOMYCIN, RANDOM: Vancomycin Rm: 18 ug/mL

## 2014-10-01 MED ORDER — VANCOMYCIN HCL IN DEXTROSE 1-5 GM/200ML-% IV SOLN
1000.0000 mg | INTRAVENOUS | Status: DC
  Administered 2014-10-01 – 2014-10-03 (×2): 1000 mg via INTRAVENOUS
  Filled 2014-10-01 (×2): qty 200

## 2014-10-01 MED ORDER — TORSEMIDE 20 MG PO TABS
20.0000 mg | ORAL_TABLET | Freq: Two times a day (BID) | ORAL | Status: DC
  Administered 2014-10-01 – 2014-10-05 (×9): 20 mg via ORAL
  Filled 2014-10-01 (×11): qty 1

## 2014-10-01 MED ORDER — SPIRONOLACTONE 12.5 MG HALF TABLET
12.5000 mg | ORAL_TABLET | Freq: Two times a day (BID) | ORAL | Status: DC
  Administered 2014-10-01 – 2014-10-04 (×7): 12.5 mg via ORAL
  Filled 2014-10-01 (×10): qty 1

## 2014-10-01 MED ORDER — POTASSIUM CHLORIDE 10 MEQ/50ML IV SOLN
10.0000 meq | Freq: Once | INTRAVENOUS | Status: AC
  Administered 2014-10-01: 10 meq via INTRAVENOUS
  Filled 2014-10-01: qty 50

## 2014-10-01 MED ORDER — POTASSIUM CHLORIDE 10 MEQ/50ML IV SOLN
10.0000 meq | INTRAVENOUS | Status: AC
  Administered 2014-10-01 (×3): 10 meq via INTRAVENOUS
  Filled 2014-10-01 (×2): qty 50

## 2014-10-01 MED ORDER — POTASSIUM CHLORIDE 10 MEQ/50ML IV SOLN
INTRAVENOUS | Status: AC
  Filled 2014-10-01: qty 50

## 2014-10-01 NOTE — Progress Notes (Addendum)
ANTIBIOTIC CONSULT NOTE - FOLLOW UP  Pharmacy Consult for Vancomycin  Indication: rule out pneumonia   Labs:  Recent Labs  09/29/14 0424 09/29/14 1555 09/30/14 0431 10/01/14 0435  WBC 11.4*  --  11.3* 11.7*  HGB 9.1* 11.6* 9.5* 9.8*  PLT 286  --  326 355  CREATININE 1.95* 2.10* 2.17* 1.95*   Estimated Creatinine Clearance: 32.5 mL/min (by C-G formula based on Cr of 1.95).  Recent Labs  09/30/14 0125 09/30/14 1700  VANCOTROUGH 29.2* 24.5*    Assessment: Supra-therapeutic vancomycin trough (drawn correctly)  Goal of Therapy:  Vancomycin trough level 15-20 mcg/ml  Plan:  -Re-check random vancomycin level in ~24 hours to allow for determination of half life and resumption of therapy.   Thank you for allowing pharmacy to be a part of this patients care team.  Lovenia KimJulie Augustine Leverette Pharm.D., BCPS, AQ-Cardiology Clinical Pharmacist 10/01/2014 1:39 PM Pager: [phone] Phone: [phone]  7:51 PM 5/1 VR 18 > est ke =  0.0127 (but levels not quite a half life apart, est CrCl ~10 ml/min, based on kinetics, will start 1 g IV q48h.   Reagen Goates C

## 2014-10-01 NOTE — Progress Notes (Signed)
Patient ID: Shelby MattocksDennis W Linenberger, male   DOB: Apr 29, 1941, 74 y.o.   MRN: [id number] TCTS DAILY ICU PROGRESS NOTE                   [address]            [place] [id number]          [phone]   5 Days Post-Op Procedure(s) (LRB): TRACHEOSTOMY (N/A) CHEST TUBE INSERTION (Left)  18  Days Post-Op Procedure(s) (LRB): CORONARY ARTERY BYPASS GRAFTING (CABG), ON PUMP, TIMES THREE, USING LEFT INTERNAL MAMMARY ARTERY, LEFT GREATER SAPHENOUS VEIN HARVESTED ENDOSCOPICALLY (N/A) PATENT FORAMEN OVALE CLOSURE  Total Length of Stay:  LOS: 18 days   Subjective: On vent, desat and large mucous plug this am while on trach collar  Objective: Vital signs in last 24 hours: Temp:  [97.6 F (36.4 C)-99.3 F (37.4 C)] 98.4 F (36.9 C) (05/01 0743) Pulse Rate:  [80-94] 88 (05/01 0923) Cardiac Rhythm:  [-] Normal sinus rhythm (05/01 0800) Resp:  [17-36] 28 (05/01 0923) BP: (108-118)/(49-59) 118/59 mmHg (05/01 0923) SpO2:  [91 %-100 %] 100 % (05/01 0923) FiO2 (%):  [40 %-60 %] 60 % (05/01 0923) Weight:  [159 lb 9.8 oz (72.4 kg)] 159 lb 9.8 oz (72.4 kg) (05/01 0600)  Filed Weights   09/28/14 0545 09/29/14 0600 10/01/14 0600  Weight: 159 lb 2.8 oz (72.2 kg) 155 lb 6.8 oz (70.5 kg) 159 lb 9.8 oz (72.4 kg)    Weight change:    Hemodynamic parameters for last 24 hours:    Intake/Output from previous day: 04/30 0701 - 05/01 0700 In: 3372.5 [I.V.:2292.5; NG/GT:830; IV Piggyback:250] Out: 1790 [Urine:1390; Emesis/NG output:400]  Intake/Output this shift: Total I/O In: 506.1 [I.V.:166.1; NG/GT:240; IV Piggyback:100] Out: 120 [Urine:120]  Current Meds: Scheduled Meds: . ALPRAZolam  0.5 mg Oral BID  . amiodarone  150 mg Intravenous Once  . amiodarone  200 mg Oral BID  . antiseptic oral rinse  7 mL Mouth Rinse QID  . aspirin EC  325 mg Oral Daily   Or  . aspirin  324 mg Per Tube Daily  . atorvastatin  40 mg Oral q1800  . bisacodyl  10 mg Oral Daily   Or  . bisacodyl  10 mg  Rectal Daily  . budesonide (PULMICORT) nebulizer solution  0.5 mg Nebulization BID  . cefTAZidime (FORTAZ)  IV  1 g Intravenous Q12H  . chlorhexidine  15 mL Mouth Rinse BID  . digoxin  0.125 mg Per Tube QODAY  . enoxaparin (LOVENOX) injection  30 mg Subcutaneous Daily  . feeding supplement (VITAL 1.5 CAL)  1,000 mL Per Tube Q24H  . insulin aspart  0-9 Units Subcutaneous 6 times per day  . insulin detemir  15 Units Subcutaneous BID  . lidocaine-prilocaine  1 application Topical Once  . metoCLOPramide (REGLAN) injection  10 mg Intravenous 4 times per day  . midodrine  10 mg Oral TID WC  . nicotine  14 mg Transdermal Daily  . oxymetazoline  2 spray Each Nare Once  . pantoprazole sodium  40 mg Per Tube Daily  . potassium chloride  10 mEq Intravenous Once  . QUEtiapine  50 mg Oral QHS  . sodium chloride  10-40 mL Intracatheter Q12H  . spironolactone  12.5 mg Oral BID  . torsemide  20 mg Oral BID   Continuous Infusions: . sodium chloride Stopped (09/30/14 2000)  . DOPamine 5 mcg/kg/min (10/01/14 0900)  . lactated ringers Stopped (09/30/14  2000)  . norepinephrine (LEVOPHED) Adult infusion 5 mcg/min (10/01/14 0900)   PRN Meds:.sodium chloride, [CANCELED] Place/Maintain arterial line **AND** sodium chloride, oxyCODONE **AND** acetaminophen, ipratropium-albuterol, levalbuterol, midazolam, ondansetron (ZOFRAN) IV, sodium chloride, traMADol  General appearance: alert, cooperative, appears older than stated age, no distress and slowed mentation Neurologic: intact Heart: regular rate and rhythm, S1, S2 normal, no murmur, click, rub or gallop Lungs: diminished breath sounds bilaterally Abdomen: soft, non-tender; bowel sounds normal; no masses,  no organomegaly Extremities: extremities normal, atraumatic, no cyanosis or edema and Homans sign is negative, no sign of DVT Wound: sternum stable  Lab Results: CBC:  Recent Labs  09/30/14 0431 10/01/14 0435  WBC 11.3* 11.7*  HGB 9.5* 9.8*  HCT  29.4* 30.3*  PLT 326 355   BMET:   Recent Labs  09/30/14 0431 10/01/14 0435  NA 137 139  K 3.1* 2.9*  CL 103 106  CO2 23 23  GLUCOSE 106* 148*  BUN 44* 45*  CREATININE 2.17* 1.95*  CALCIUM 8.5 8.3*    PT/INR: No results for input(s): LABPROT, INR in the last 72 hours. Radiology: Dg Chest Port 1 View  09/30/2014   CLINICAL DATA:  Shortness of breath.  Left-sided chest tube.  EXAM: PORTABLE CHEST - 1 VIEW  COMPARISON:  09/29/2014; 09/28/2014; 09/27/2014  FINDINGS: Grossly unchanged cardiac silhouette and mediastinal contours post median sternotomy and CABG. Stable position of support apparatus. Ill-defined heterogeneous/ consolidative opacities again noted about the peripheral aspect of the left upper/ mid lung at the site of the recently removed left-sided chest tube. No pneumothorax. The lungs are hyperexpanded with flattening the bilaterally diaphragms. Grossly unchanged ill-defined heterogeneous opacities within the bilateral lung bases. Unchanged trace bilateral effusions. Unchanged bones.  IMPRESSION: 1.  Stable positioning of support apparatus.  No pneumothorax. 2. Similar findings of mild pulmonary edema and trace bilateral effusions superimposed on lung hyperexpansion.   Electronically Signed   By: Simonne Come M.D.   On: 09/30/2014 08:27   Dg Abd Portable 1v  09/30/2014   CLINICAL DATA:  NG tube  EXAM: PORTABLE ABDOMEN - 1 VIEW  COMPARISON:  09/29/2014; 09/28/2014  FINDINGS: Weighted tip enteric tube projects over the expected location of the horizontal segment of the duodenum. There is an additional enteric to whose tip and side port projects over the expected location of the gastric antrum. Epicardial leads overlie the caudal aspect of the heart.  Paucity of bowel gas without definite evidence of obstruction. No supine evidence of pneumoperitoneum.  IMPRESSION: 1. Weighted tip enteric tube overlies expected location of the horizontal segment of the duodenum. 2. Additional enteric tube  tip and side port projects over the expected location of the gastric antrum. 3. Paucity of bowel gas without evidence of obstruction.   Electronically Signed   By: Simonne Come M.D.   On: 09/30/2014 08:22     Assessment/Plan: S/P Procedure(s) (LRB): TRACHEOSTOMY (N/A) CHEST TUBE INSERTION (Left)   18  Days Post-Op Procedure(s) (LRB): CORONARY ARTERY BYPASS GRAFTING (CABG), ON PUMP, TIMES THREE, USING LEFT INTERNAL MAMMARY ARTERY, LEFT GREATER SAPHENOUS VEIN HARVESTED ENDOSCOPICALLY (N/A) PATENT FORAMEN OVALE CLOSURE  Continue ABX therapy due to Post-op infection  -aspiration pneumonia Trach collar trials Gastric ileus- improved  Pt/ot consult to evaluate for ltac     Delight Ovens 10/01/2014 10:44 AM

## 2014-10-01 NOTE — Progress Notes (Addendum)
Advanced Heart Failure Rounding Note   Subjective:     Impella removed 4/20. Swan out 4/24. Underwent trach and left chest tube 4/26  On trach. Failed vent wean again yesterday. Sitting in chair watching TV.   Demadex and acetazolamide off. Weight going back up. Remains on low dose dopamine and levophed.  Midodrine at 10 tid. Renal function slightly improved.  K +2.9. Ileus resolved  Maintaining SR on amio    Objective:   Weight Range:  Vital Signs:   Temp:  [97.6 F (36.4 C)-99.3 F (37.4 C)] 98.4 F (36.9 C) (05/01 0743) Pulse Rate:  [80-94] 87 (05/01 0907) Resp:  [17-36] 19 (05/01 0907) BP: (95-113)/(47-51) 108/49 mmHg (05/01 0907) SpO2:  [79 %-100 %] 100 % (05/01 0907) FiO2 (%):  [40 %] 40 % (05/01 0907) Weight:  [72.4 kg (159 lb 9.8 oz)] 72.4 kg (159 lb 9.8 oz) (05/01 0600)    Weight change: Filed Weights   09/28/14 0545 09/29/14 0600 10/01/14 0600  Weight: 72.2 kg (159 lb 2.8 oz) 70.5 kg (155 lb 6.8 oz) 72.4 kg (159 lb 9.8 oz)    Intake/Output:   Intake/Output Summary (Last 24 hours) at 10/01/14 0912 Last data filed at 10/01/14 0700  Gross per 24 hour  Intake 3020.31 ml  Output   1585 ml  Net 1435.31 ml     Physical Exam: General:  On trach  awake HEENT: normal Neck: + trach  Cor: Sternal dressing. Regular rate & rhythm. Lungs:decreased BS Abdomen: soft, nontender, nondistended. No hepatosplenomegaly. No bruits or masses. Good bowel sounds. Extremities: no cyanosis, clubbing, rash, 2-3+ edema into thighs Neuro: alert & orientedx3, cranial nerves grossly intact. moves all 4 extremities w/o difficulty. Affect pleasant  Telemetry: NSR  Labs: Basic Metabolic Panel:  Recent Labs Lab 09/26/14 0410 09/27/14 0500  09/28/14 0348 09/28/14 1638 09/29/14 0424 09/29/14 1555 09/30/14 0431 10/01/14 0435  NA 132* 131*  < > 133* 134* 137 137 137 139  K 4.5 5.0  < > 3.6 3.1* 2.6* 3.7 3.1* 2.9*  CL 98 98  < > 96 98 105 100 103 106  CO2 25 24  --  24  --   23  --  23 23  GLUCOSE 139* 119*  < > 127* 125* 122* 127* 106* 148*  BUN 34* 36*  < > 39* 37* 37* 40* 44* 45*  CREATININE 1.76* 1.74*  < > 1.99* 2.00* 1.95* 2.10* 2.17* 1.95*  CALCIUM 7.8* 8.1*  --  8.0*  --  7.4*  --  8.5 8.3*  MG 1.6  --   --  1.7  --   --   --   --   --   PHOS 2.2*  --   --  4.5  --   --   --   --   --   < > = values in this interval not displayed.  Liver Function Tests:  Recent Labs Lab 09/27/14 0500 09/28/14 0348 09/29/14 0424 09/30/14 0431 10/01/14 0435  AST 96* 115* 76* 81* 70*  ALT 69* 72* 49 57* 55  ALKPHOS 365* 485* 300* 323* 295*  BILITOT 4.2* 3.3* 2.7* 3.0* 2.7*  PROT 5.1* 5.6* 4.8* 5.5* 5.4*  ALBUMIN 1.8* 2.3* 2.4* 2.4* 2.3*   No results for input(s): LIPASE, AMYLASE in the last 168 hours. No results for input(s): AMMONIA in the last 168 hours.  CBC:  Recent Labs Lab 09/27/14 0500  09/28/14 0348 09/28/14 1638 09/29/14 0424 09/29/14 1555 09/30/14 0431 10/01/14 9604  WBC 16.1*  --  13.3*  --  11.4*  --  11.3* 11.7*  NEUTROABS 14.8*  --  11.8*  --  10.1*  --  9.8* 10.2*  HGB 11.1*  < > 10.4* 11.2* 9.1* 11.6* 9.5* 9.8*  HCT 33.3*  < > 31.0* 33.0* 27.9* 34.0* 29.4* 30.3*  MCV 87.9  --  89.6  --  91.2  --  91.3 90.7  PLT 230  --  284  --  286  --  326 355  < > = values in this interval not displayed.  Cardiac Enzymes: No results for input(s): CKTOTAL, CKMB, CKMBINDEX, TROPONINI in the last 168 hours.  BNP: BNP (last 3 results)  Recent Labs  09/13/14 1310  BNP 96.8    ProBNP (last 3 results) No results for input(s): PROBNP in the last 8760 hours.    Other results:  Imaging: Dg Chest Port 1 View  09/30/2014   CLINICAL DATA:  Shortness of breath.  Left-sided chest tube.  EXAM: PORTABLE CHEST - 1 VIEW  COMPARISON:  09/29/2014; 09/28/2014; 09/27/2014  FINDINGS: Grossly unchanged cardiac silhouette and mediastinal contours post median sternotomy and CABG. Stable position of support apparatus. Ill-defined heterogeneous/  consolidative opacities again noted about the peripheral aspect of the left upper/ mid lung at the site of the recently removed left-sided chest tube. No pneumothorax. The lungs are hyperexpanded with flattening the bilaterally diaphragms. Grossly unchanged ill-defined heterogeneous opacities within the bilateral lung bases. Unchanged trace bilateral effusions. Unchanged bones.  IMPRESSION: 1.  Stable positioning of support apparatus.  No pneumothorax. 2. Similar findings of mild pulmonary edema and trace bilateral effusions superimposed on lung hyperexpansion.   Electronically Signed   By: Simonne Come M.D.   On: 09/30/2014 08:27   Dg Chest Port 1 View  09/29/2014   CLINICAL DATA:  Chest tube removal  EXAM: PORTABLE CHEST - 1 VIEW  COMPARISON:  09/28/2014  FINDINGS: Interval removal of the left-sided chest tube. There is no pneumothorax or increased pleural fluid.  A right upper extremity PICC remains with tip at the SVC level. Tracheostomy tube is well seated. The feeding tube continues at least into the stomach.  Stable heart size and mediastinal contours post CABG.  Unchanged hazy appearance of the lower chest consistent with atelectasis and layering effusions.  IMPRESSION: 1. No pneumothorax after chest tube removal. 2. Remaining tubes and central line are in stable position. 3. Unchanged layering effusions.   Electronically Signed   By: Marnee Spring M.D.   On: 09/29/2014 09:29   Dg Abd Portable 1v  09/30/2014   CLINICAL DATA:  NG tube  EXAM: PORTABLE ABDOMEN - 1 VIEW  COMPARISON:  09/29/2014; 09/28/2014  FINDINGS: Weighted tip enteric tube projects over the expected location of the horizontal segment of the duodenum. There is an additional enteric to whose tip and side port projects over the expected location of the gastric antrum. Epicardial leads overlie the caudal aspect of the heart.  Paucity of bowel gas without definite evidence of obstruction. No supine evidence of pneumoperitoneum.  IMPRESSION:  1. Weighted tip enteric tube overlies expected location of the horizontal segment of the duodenum. 2. Additional enteric tube tip and side port projects over the expected location of the gastric antrum. 3. Paucity of bowel gas without evidence of obstruction.   Electronically Signed   By: Simonne Come M.D.   On: 09/30/2014 08:22   Dg Abd Portable 1v  09/29/2014   CLINICAL DATA:  Ileus.  Abdominal pain.  EXAM: PORTABLE ABDOMEN - 1 VIEW  COMPARISON:  09/28/2014  FINDINGS: Feeding tube is partially looped on itself in the distal stomach. Mild-to-moderate gaseous distention of the stomach is slightly less than on the prior study. There is a small to moderate amount of stool throughout the colon. Small bowel gaseous distension on the prior study has resolved. Gas is present in scattered loops of decompressed small bowel in the abdomen on the current study. No acute osseous abnormality.  IMPRESSION: Interval resolution of gaseous bowel distention. Slightly decreased distention of the stomach.   Electronically Signed   By: Sebastian AcheAllen  Grady   On: 09/29/2014 09:29     Medications:     Scheduled Medications: . ALPRAZolam  0.5 mg Oral BID  . amiodarone  150 mg Intravenous Once  . amiodarone  200 mg Oral BID  . antiseptic oral rinse  7 mL Mouth Rinse QID  . aspirin EC  325 mg Oral Daily   Or  . aspirin  324 mg Per Tube Daily  . atorvastatin  40 mg Oral q1800  . bisacodyl  10 mg Oral Daily   Or  . bisacodyl  10 mg Rectal Daily  . budesonide (PULMICORT) nebulizer solution  0.5 mg Nebulization BID  . cefTAZidime (FORTAZ)  IV  1 g Intravenous Q12H  . chlorhexidine  15 mL Mouth Rinse BID  . digoxin  0.125 mg Per Tube QODAY  . enoxaparin (LOVENOX) injection  30 mg Subcutaneous Daily  . feeding supplement (VITAL 1.5 CAL)  1,000 mL Per Tube Q24H  . insulin aspart  0-9 Units Subcutaneous 6 times per day  . insulin detemir  15 Units Subcutaneous BID  . lidocaine-prilocaine  1 application Topical Once  .  metoCLOPramide (REGLAN) injection  10 mg Intravenous 4 times per day  . midodrine  10 mg Oral TID WC  . nicotine  14 mg Transdermal Daily  . oxymetazoline  2 spray Each Nare Once  . pantoprazole sodium  40 mg Per Tube Daily  . [COMPLETED] potassium chloride  10 mEq Intravenous Q1 Hr x 3  . QUEtiapine  50 mg Oral QHS  . sodium chloride  10-40 mL Intracatheter Q12H    Infusions: . sodium chloride Stopped (09/30/14 2000)  . sodium chloride 250 mL (10/01/14 0700)  . DOPamine 5 mcg/kg/min (10/01/14 0800)  . lactated ringers Stopped (09/30/14 2000)  . norepinephrine (LEVOPHED) Adult infusion 5.013 mcg/min (10/01/14 0700)    PRN Medications: sodium chloride, [CANCELED] Place/Maintain arterial line **AND** sodium chloride, oxyCODONE **AND** acetaminophen, ipratropium-albuterol, levalbuterol, midazolam, ondansetron (ZOFRAN) IV, sodium chloride, traMADol   Assessment:   1. Cardiogenic shock 2. Acute systolic HF EF 25-30% by TEE 4/15 3. Acute anterolateral STEMI    --Emergent CABG 4/13 4. Acute respiratory failure   --s/p trach 4/26 5. Thrombocytopenia 6. Acute renal failure, stage III 7. Hyponatremia/hyponkalemia 8. PAF with RVR 9. Hyperbilirubinemia  Plan/Discussion:    He is s/p trach.   Continues to improve slowly but still pressor dependent. Try to wean dopamine slowly. Continue midodrine - I am hesitant to go much higher than 30mg /day at this point but may have to consider. Restart diuretics gently and add spiro to help with K wasting. Consult PT. Likely will need [facility] prior to going home. Maintaining NSR on amio. Ileus has improved.   Supp K+   Length of Stay: 18 Arvilla MeresBensimhon, Daniel MD  10/01/2014, 9:12 AM  Advanced Heart Failure Team Pager [phone] (M-F; 7a - 4p)  Please contact [facility] for  night-coverage after hours (4p -7a ) and weekends on [email or web address]

## 2014-10-02 ENCOUNTER — Inpatient Hospital Stay (HOSPITAL_COMMUNITY): Payer: Medicare Other

## 2014-10-02 ENCOUNTER — Other Ambulatory Visit (HOSPITAL_COMMUNITY): Payer: Medicare Other

## 2014-10-02 DIAGNOSIS — I2101 ST elevation (STEMI) myocardial infarction involving left main coronary artery: Secondary | ICD-10-CM

## 2014-10-02 DIAGNOSIS — I5021 Acute systolic (congestive) heart failure: Secondary | ICD-10-CM

## 2014-10-02 LAB — CBC WITH DIFFERENTIAL/PLATELET
Basophils Absolute: 0.1 10*3/uL (ref 0.0–0.1)
Basophils Relative: 1 % (ref 0–1)
Eosinophils Absolute: 0.1 10*3/uL (ref 0.0–0.7)
Eosinophils Relative: 1 % (ref 0–5)
HCT: 31 % — ABNORMAL LOW (ref 39.0–52.0)
Hemoglobin: 9.9 g/dL — ABNORMAL LOW (ref 13.0–17.0)
Lymphocytes Relative: 4 % — ABNORMAL LOW (ref 12–46)
Lymphs Abs: 0.5 10*3/uL — ABNORMAL LOW (ref 0.7–4.0)
MCH: 29.6 pg (ref 26.0–34.0)
MCHC: 31.9 g/dL (ref 30.0–36.0)
MCV: 92.5 fL (ref 78.0–100.0)
Monocytes Absolute: 1.9 10*3/uL — ABNORMAL HIGH (ref 0.1–1.0)
Monocytes Relative: 15 % — ABNORMAL HIGH (ref 3–12)
Neutro Abs: 10.5 10*3/uL — ABNORMAL HIGH (ref 1.7–7.7)
Neutrophils Relative %: 81 % — ABNORMAL HIGH (ref 43–77)
Platelets: 378 10*3/uL (ref 150–400)
RBC: 3.35 MIL/uL — ABNORMAL LOW (ref 4.22–5.81)
RDW: 18.4 % — ABNORMAL HIGH (ref 11.5–15.5)
WBC: 12.9 10*3/uL — ABNORMAL HIGH (ref 4.0–10.5)

## 2014-10-02 LAB — GLUCOSE, CAPILLARY
Glucose-Capillary: 105 mg/dL — ABNORMAL HIGH (ref 70–99)
Glucose-Capillary: 117 mg/dL — ABNORMAL HIGH (ref 70–99)
Glucose-Capillary: 121 mg/dL — ABNORMAL HIGH (ref 70–99)
Glucose-Capillary: 127 mg/dL — ABNORMAL HIGH (ref 70–99)
Glucose-Capillary: 143 mg/dL — ABNORMAL HIGH (ref 70–99)
Glucose-Capillary: 143 mg/dL — ABNORMAL HIGH (ref 70–99)

## 2014-10-02 LAB — COMPREHENSIVE METABOLIC PANEL
ALT: 52 U/L (ref 17–63)
AST: 68 U/L — ABNORMAL HIGH (ref 15–41)
Albumin: 2.3 g/dL — ABNORMAL LOW (ref 3.5–5.0)
Alkaline Phosphatase: 281 U/L — ABNORMAL HIGH (ref 38–126)
Anion gap: 8 (ref 5–15)
BUN: 53 mg/dL — ABNORMAL HIGH (ref 6–20)
CO2: 24 mmol/L (ref 22–32)
Calcium: 8 mg/dL — ABNORMAL LOW (ref 8.9–10.3)
Chloride: 109 mmol/L (ref 101–111)
Creatinine, Ser: 1.81 mg/dL — ABNORMAL HIGH (ref 0.61–1.24)
GFR calc Af Amer: 42 mL/min — ABNORMAL LOW (ref >60)
GFR calc non Af Amer: 36 mL/min — ABNORMAL LOW (ref >60)
Glucose, Bld: 118 mg/dL — ABNORMAL HIGH (ref 70–99)
Potassium: 3.7 mmol/L (ref 3.5–5.1)
Sodium: 141 mmol/L (ref 135–145)
Total Bilirubin: 2.4 mg/dL — ABNORMAL HIGH (ref 0.3–1.2)
Total Protein: 5.3 g/dL — ABNORMAL LOW (ref 6.5–8.1)

## 2014-10-02 LAB — PROCALCITONIN: Procalcitonin: 0.24 ng/mL

## 2014-10-02 MED ORDER — ALPRAZOLAM 0.25 MG PO TABS
0.2500 mg | ORAL_TABLET | Freq: Two times a day (BID) | ORAL | Status: DC
  Administered 2014-10-02 – 2014-10-03 (×2): 0.25 mg via ORAL
  Filled 2014-10-02 (×2): qty 1

## 2014-10-02 MED ORDER — METOCLOPRAMIDE HCL 5 MG/ML IJ SOLN
10.0000 mg | Freq: Two times a day (BID) | INTRAMUSCULAR | Status: DC
  Administered 2014-10-02 – 2014-10-05 (×6): 10 mg via INTRAVENOUS
  Filled 2014-10-02 (×8): qty 2

## 2014-10-02 NOTE — Progress Notes (Signed)
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 Seen as co-treat with PT. Pt with increasing RR with minimal activity on weaning mode. Respiratory called to increase support during session. Pt tolerated well with MAP @ 70 EOB. Pt able to stand step to chair with mod A +2. Excellent participation. Pt will benefit from OT to maximize functional level of independence and [facility]     Follow Up Recommendations  [facility];Supervision/Assistance - 24 hour    Equipment Recommendations  3 in 1 bedside comode    Recommendations for Other Services       Precautions / Restrictions Precautions Precautions: Sternal;Fall Precaution Comments: trach/vent Restrictions Weight Bearing Restrictions:  (sternal precautions)      Mobility Bed Mobility Overal bed mobility: +2 for physical assistance Bed Mobility: Rolling;Sidelying to Sit Rolling: Max assist Sidelying to sit: +2 for physical assistance;Max assist       General bed mobility comments: Pt able to assist with moving BLE to EOB and moving BUE to assist   Transfers Overall transfer level: Needs assistance Equipment used: 2 person hand held assist Transfers: Sit to/from BJ'sStand;Stand Pivot Transfers Sit to Stand: +2 physical assistance;Mod assist Stand pivot transfers: +2 physical assistance;Mod assist       General transfer comment: Used BUE support and pad to lift pt. Pt assisting to come to stand and taking steps to chair    Balance Overall balance assessment: Needs assistance Sitting-balance support: Feet supported;Bilateral upper extremity supported Sitting balance-Leahy Scale: Fair     Standing balance support: During  functional activity;Bilateral upper extremity supported Standing balance-Leahy Scale: Poor                              ADL Overall ADL's : Needs assistance/impaired Eating/Feeding: NPO   Grooming: Moderate assistance   Upper Body Bathing: Moderate assistance;Sitting   Lower Body Bathing: Total assistance;Bed level   Upper Body Dressing : Maximal assistance   Lower Body Dressing: Total assistance;Bed level   Toilet Transfer: Stand-pivot;+2 for physical assistance;Moderate assistance (simulated)   Toileting- Clothing Manipulation and Hygiene: Total assistance Toileting - Clothing Manipulation Details (indicate cue type and reason): foley; incontinent BM     Functional mobility during ADLs: +2 for physical assistance;Moderate assistance       Vision Additional Comments: pt reports diplopia at baseline? closing 1 eye at times during session; will assess further    Perception     Praxis      Pertinent Vitals/Pain Pain Assessment: Faces Faces Pain Scale: Hurts little more Pain Location: unable to determine Pain Descriptors / Indicators: Grimacing Pain Intervention(s): Patient requesting pain meds-RN notified;Limited activity within patient's tolerance;Monitored during session  MAP @ 60 beginning os session. RR 40s with minimal activity on PSV; RR put on PRVC -Increased to 70 EOB and once in chair. RR 16; Peep 5; PRVC     Hand Dominance Right   Extremity/Trunk Assessment Upper Extremity Assessment RUE Deficits / Details: RUE AROM WFL with exception of shoulder FF to @ 90. limited by weakness; strength overall @ 3+/5 elbow/hand. shoulder 2/5 LUE Deficits / Details: LUE AROM WFL with strength overall 3+/5   Lower Extremity Assessment Lower  Extremity Assessment: Defer to PT evaluation (RLE appears weaker then LLE)   Cervical / Trunk Assessment Cervical / Trunk Assessment: Other exceptions Cervical / Trunk Exceptions: forward head   Communication  Communication Communication: Tracheostomy   Cognition Arousal/Alertness: minimal lethargy at times - due to meds (xanax earlier) Behavior During Therapy: WFL for tasks assessed/performed Overall Cognitive Status: Difficult to assess due to trach                     General Comments   Very cooperative; appears motivate to get stronger                 Home Living Family/patient expects to be discharged to:: Other (Comment) (LTACH) Living Arrangements: Children                                      Prior Functioning/Environment Level of Independence:  (unsure)             OT Diagnosis: Generalized weakness;Acute pain   OT Problem List: Decreased strength;Decreased range of motion;Decreased activity tolerance;Impaired balance (sitting and/or standing);Decreased coordination;Decreased knowledge of use of DME or AE;Decreased knowledge of precautions;Cardiopulmonary status limiting activity;Pain;Increased edema   OT Treatment/Interventions: Self-care/ADL training;Therapeutic exercise    OT Goals(Current goals can be found in the care plan section) Acute Rehab OT Goals Patient Stated Goal: nods head "yes" to get better OT Goal Formulation: With patient Time For Goal Achievement: 10/16/14 Potential to Achieve Goals: Good ADL Goals Pt Will Perform Grooming: with set-up;bed level Pt Will Perform Upper Body Bathing: with set-up;sitting;bed level Pt Will Perform Lower Body Bathing: sitting/lateral leans;bed level;with mod assist Pt Will Transfer to Toilet: with mod assist;bedside commode;stand pivot transfer Pt/caregiver will Perform Home Exercise Program: Increased strength;Both right and left upper extremity;With written HEP provided;With minimal assist (AROM)  OT Frequency: Min 2X/week   Barriers to D/C:            Co-evaluation              End of Session Equipment Utilized During Treatment: Oxygen;Other (comment) (vent) Nurse Communication:  Mobility status  Activity Tolerance: Patient tolerated treatment well Patient left: in chair;with call bell/phone within reach   Time: 1610-9604 OT Time Calculation (min): 40 min Charges:  OT General Charges $OT Visit: 1 Procedure OT Evaluation $Initial OT Evaluation Tier I: 1 Procedure G-Codes:    Decklyn Hyder,HILLARY 10-18-2014, 2:56 PM

## 2014-10-02 NOTE — Progress Notes (Signed)
Increased PS d/t spont RR at 35-36 on 15 PSV. PT tol well, RN aware pt on wean.

## 2014-10-02 NOTE — Progress Notes (Signed)
Physical Therapy Treatment Patient Details Name: [name] MRN: [id number] DOB: [birth year] Today's Date: 10/02/2014    History of Present Illness Adm 4/13 with MI; underwent CABG and required Impella LVAD until 4/20; remains on Ventilator; trach 4/26  PMHx- HTN, anxiety, depression    PT Comments    Pt more alert. Was weaning on PSV with RR 38-40 with minimal ROM in supine. RT contacted and agreed to put pt on PRVC for incr support (PS10, RR 16, PEEP 5, FiO2 40%) and pt tolerating activity with SaO2 >93%. Moving much better today. Able to use writing to communicate some basic needs (difficult to read due to weakness in hand).    Follow Up Recommendations  LTACH;Supervision/Assistance - 24 hour     Equipment Recommendations   (TBA)    Recommendations for Other Services       Precautions / Restrictions Precautions Precautions: Sternal;Fall Precaution Comments: trach/vent Restrictions Weight Bearing Restrictions:  (sternal precautions)    Mobility  Bed Mobility Overal bed mobility: +2 for physical assistance Bed Mobility: Rolling;Sidelying to Sit Rolling: Max assist Sidelying to sit: +2 for physical assistance;Max assist       General bed mobility comments: Pt able to assist with moving BLE to EOB and moving BUE to assist   Transfers Overall transfer level: Needs assistance Equipment used: 2 person hand held assist Transfers: Sit to/from [employer] Sit to Stand: +2 physical assistance;Mod assist Stand pivot transfers: +2 physical assistance;Mod assist       General transfer comment: Used BUE support and pad to assist pt to upright standing. Pt assisting to come to stand and taking steps to chair  Ambulation/Gait                 Stairs            Wheelchair Mobility    Modified Rankin (Stroke Patients Only)       Balance Overall balance assessment: Needs assistance Sitting-balance support: Feet supported;Bilateral upper  extremity supported Sitting balance-Leahy Scale: Fair     Standing balance support: During functional activity;Bilateral upper extremity supported Standing balance-Leahy Scale: Poor                      Cognition Arousal/Alertness: Awake/alert Behavior During Therapy: WFL for tasks assessed/performed Overall Cognitive Status: Difficult to assess                      Exercises General Exercises - Upper Extremity Shoulder Flexion:  (unilaterally) Shoulder Extension:  (slight resistance given) Elbow Extension:  (slight resistance)    General Comments        Pertinent Vitals/Pain Pain Assessment: Faces Faces Pain Scale: Hurts little more Pain Location: unable to determine-trach Pain Descriptors / Indicators: Grimacing Pain Intervention(s): Limited activity within patient's tolerance;Monitored during session;Repositioned    Home Living Family/patient expects to be discharged to:: Other (Comment) (LTACH) Living Arrangements: Children                  Prior Function Level of Independence:  (unsure)          PT Goals (current goals can now be found in the care plan section) Acute Rehab PT Goals Patient Stated Goal: nods head "yes" to get better Progress towards PT goals: Progressing toward goals    Frequency  Min 3X/week    PT Plan Current plan remains appropriate    Co-evaluation PT/OT/SLP Co-Evaluation/Treatment: Yes Reason for Co-Treatment: Complexity of the patient's impairments (  multi-system involvement);For patient/therapist safety         End of Session Equipment Utilized During Treatment: Oxygen ([facility]) Activity Tolerance: Patient limited by fatigue Patient left: in chair;with call bell/phone within reach;with nursing/sitter in room;with restraints reapplied     Time: 1315-1404 PT Time Calculation (min) (ACUTE ONLY): 49 min  Charges:  $Therapeutic Activity: 23-37 mins                     G Codes:      Daniil Labarge 10/02/2014, 3:40 PM Pager [phone]

## 2014-10-02 NOTE — Progress Notes (Signed)
UR Completed.  [phone]  

## 2014-10-02 NOTE — Progress Notes (Signed)
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  PCCM consulted post-op for assistance with shock and vent management.  STUDIES:  4/13 cardiac cath 4/13 Echo >> EF 35%  SIGNIFICANT EVENTS: 4/13 CABG, closure of PFO, impella, VDRF, cardiogenic shock  4/14 Lost pacemaker capture >> Cardiac arrest >> 8 minutes before ROSC >> wide complex tachycardia >> cardioversion; Hb 5 >> transfused PRBC 4/18 A fib with RVR >> amiodarone added 4/19 Oxygen desaturation >> suctioned mucus plug; a fib with RVR >> cardioversion 4/20 Impella out; A fib with RVR >> cardioversion 4/26 trach placed, L chest tube  SUBJECTIVE:  Got Xanax this morning and now sleepy, less responsive  VITAL SIGNS: Temp:  [97.5 F (36.4 C)-98.6 F (37 C)] 98.6 F (37 C) (05/02 0821) Pulse Rate:  [77-89] 85 (05/02 0800) Resp:  [14-35] 19 (05/02 0800) BP: (112-130)/(46-53) 115/46 mmHg (05/01 2341) SpO2:  [96 %-100 %] 100 % (05/02 0800) FiO2 (%):  [40 %] 40 % (05/02 0751) Weight:  [163 lb 12.8 oz (74.3 kg)] 163 lb 12.8 oz (74.3 kg) (05/02 0500) HEMODYNAMICS:   VENTILATOR SETTINGS: Vent Mode:  [-] PSV;CPAP FiO2 (%):  [40 %] 40 % Set Rate:  [16 bmp] 16 bmp Vt Set:  [600 mL] 600 mL PEEP:  [5 cmH20] 5 cmH20 Pressure Support:  [15 cmH20-20 cmH20] 20 cmH20 Plateau Pressure:  [22 cmH20-29 cmH20] 22 cmH20 INTAKE / OUTPUT: Intake/Output      05/01 0701 - 05/02 0700 05/02 0701 - 05/03 0700   I.V. (mL/kg) 296.1 (4)    Other 30    NG/GT 1550    IV Piggyback 200    Total Intake(mL/kg) 2076.1 (27.9)    Urine (mL/kg/hr) 2570 (1.4)    Emesis/NG output     Stool  0 (0)    Total Output 2570     Net -493.9          Stool Occurrence 1 x     PHYSICAL EXAMINATION:  Gen: chronically ill appearing, on vent HENT: trach site c/d/i PULM: diminished bases, few crackles CV: Irreg irreg, no mgr GI: BS+, soft, nontender Derm: no cyanosis or rash MSK: swelling throughout Neuro: sleepy, arouses to voice, maew   LABS:  CBC  Recent Labs Lab 09/30/14 0431 10/01/14 0435 10/02/14 0500  WBC 11.3* 11.7* 12.9*  HGB 9.5* 9.8* 9.9*  HCT 29.4* 30.3* 31.0*  PLT 326 355 378     Coag's No results for input(s): APTT, INR in the last 168 hours.  BMET  Recent Labs Lab 09/30/14 0431 10/01/14 0435 10/02/14 0500  NA 137 139 141  K 3.1* 2.9* 3.7  CL 103 106 109  CO2 BUN 44* 45* 53*  CREATININE 2.17* 1.95* 1.81*  GLUCOSE 106* 148* 118*     Electrolytes  Recent Labs Lab 09/26/14 0410  09/28/14 0348  09/30/14 0431 10/01/14 0435 10/02/14 0500  CALCIUM 7.8*  < > 8.0*  < > 8.5 8.3* 8.0*  MG 1.6  --  1.7  --   --   --   --   PHOS 2.2*  --  4.5  --   --   --   --   < > =  values in this interval not displayed.   Sepsis Markers  Recent Labs Lab 09/27/14 0500 09/28/14 0348  PROCALCITON 0.33 0.40   ABG  Recent Labs Lab 09/26/14 0425 09/27/14 0412 10/01/14 0421  PHART 7.470* 7.425 7.395  PCO2ART 37.7 39.0 35.7  PO2ART 89.0 72.4* 94.0   Liver Enzymes  Recent Labs Lab 09/30/14 0431 10/01/14 0435 10/02/14 0500  AST 81* 70* 68*  ALT 57* 55 52  ALKPHOS 323* 295* 281*  BILITOT 3.0* 2.7* 2.4*  ALBUMIN 2.4* 2.3* 2.3*   Cardiac Enzymes No results for input(s): TROPONINI, PROBNP in the last 168 hours.   Glucose  Recent Labs Lab 10/01/14 1126 10/01/14 1519 10/01/14 2007 10/02/14 0034 10/02/14 0421 10/02/14 0819  GLUCAP 185* 116* 85 143* 127* 121*   Imaging 5/2 CXR images reviewed> stable bilateral effusions, chest tubes out  ASSESSMENT / PLAN:  CARDIOVASCULAR A:  STEMI s/p emergent CABG and PFO closure  4/13 Chronic systolic CHF - EF 25-30% Cardiogenic shock A fib with RVR Hx of HTN, HLD P:  - Wean off Levophed - pressors, echo per CVTS  PULMONARY ETT 4/13 >>4/26 Trach (CVTS) 4/26>>> L chest tube 4/26>>> A: Prolonged VDRF at this point due primarily to pulm edema and pleural effusions > little improvement Pleural effusion s unchanged P:   - I decreased pressure support to 15/5, goal 8 hours today - Follow CXR, ABG - Titrate O2 for sats - I agree with continued diuresis with spironolactone and torsemide  RENAL A:   Hyponatremia Hypokalemia AKI Cr stable, good UOP  P:   - Monitor BMET intermittently - Monitor I/Os - Correct electrolytes as indicated  GASTROINTESTINAL A:   Hx of GERD Hyperbilirubinemia > improving, multifactorial per GI P:   - Cont SUP - TF as tol   HEMATOLOGIC A:   Mild anemia without acute blood loss Thrombocytopenia, improving P:  - DVT px: LMWH - Monitor CBC intermittently - Transfuse per usual ICU guidelines  INFECTIOUS A:   Fever > resolved HCAP> improving P:   - Ceftaz 4/20>>> - Vanc 4/26>>> 4/28 - Abx per TCTS - will repeat procalcitonin, feel we could stop antibiotics  ENDOCRINE A:   Hyperglycemia P:   - Cont SSI   NEUROLOGIC A:   Acute encephalopathy, new again on 5/2, due to xanax most likely Hx of Anxiety / Depression P:   - RASS goal 0 - PRN morphine  - decrease Xanax to 0.25mg  bid, monitor frequent neuro status  Heber CarolinaBrent Mamadou Breon, MD [facility] [facility] Pager: [phone] Cell: [phone] If no response, call [phone]

## 2014-10-02 NOTE — Progress Notes (Signed)
TCTS BRIEF SICU PROGRESS NOTE  6 Days Post-Op  S/P Procedure(s) (LRB): TRACHEOSTOMY (N/A) CHEST TUBE INSERTION (Left)   Essentially stable day although still on levophed @ 4 Maintaining NSR O2 sats 100% - no trach trials today UOP adequate  Plan: Continue current plan  [name] 10/02/2014 7:27 PM

## 2014-10-02 NOTE — Evaluation (Signed)
[facility]y-[facility] Speaking Valve - Evaluation Patient Details  Name: Justin MattocksDennis W Spilde MRN: [id number] Date of Birth: Sep 07, 1940  Today's Date: 10/02/2014 Time: [id number] SLP Time Calculation (min) (ACUTE ONLY): 16 min  Past Medical History:  Past Medical History  Diagnosis Date  . Dyspnea   . Palpitations   . Anxiety and depression   . GERD (gastroesophageal reflux disease)   . Hypertension   . Hypercholesteremia    Past Surgical History:  Past Surgical History  Procedure Laterality Date  . Appendectomy    . Cataract extraction w/phaco  05/06/2012    Procedure: CATARACT EXTRACTION PHACO AND INTRAOCULAR LENS PLACEMENT (IOC);  Surgeon: Gemma PayorKerry Hunt, MD;  Location: [facility];  Service: Ophthalmology;  Laterality: Right;  CDE:22.64  . Cataract extraction w/phaco  05/24/2012    Procedure: CATARACT EXTRACTION PHACO AND INTRAOCULAR LENS PLACEMENT (IOC);  Surgeon: Gemma PayorKerry Hunt, MD;  Location: [facility];  Service: Ophthalmology;  Laterality: Left;  CDE: 27.08  . Cardiac catheterization  09/13/2014    Procedure: VENTRICULAR ASSIST DEVICE INSERTION;  Surgeon: Corky CraftsJayadeep S Varanasi, MD;  [facility];  Service: Cardiovascular;;  . Cardiac catheterization  09/13/2014    Procedure: CORONARY BALLOON ANGIOPLASTY;  Surgeon: Corky CraftsJayadeep S Varanasi, MD;  [facility];  Service: Cardiovascular;;  . Coronary angiogram  09/13/2014    Procedure: CORONARY ANGIOGRAM;  Surgeon: Corky CraftsJayadeep S Varanasi, MD;  [facility];  Service: Cardiovascular;;  . Coronary artery bypass graft N/A 09/13/2014    Procedure: CORONARY ARTERY BYPASS GRAFTING (CABG), ON PUMP, TIMES THREE, USING LEFT INTERNAL MAMMARY ARTERY, LEFT GREATER SAPHENOUS VEIN HARVESTED ENDOSCOPICALLY;  Surgeon: [name], MD;  [facility];  Service: Open Heart Surgery;  Laterality: N/A;  . Patent foramen ovale closure  09/13/2014    Procedure: PATENT FORAMEN OVALE CLOSURE;  Surgeon: [name], MD;  [facility];  Service: Open Heart  Surgery;;  . Tracheostomy tube placement N/A 09/26/2014    Procedure: TRACHEOSTOMY;  Surgeon: [name], MD;  [facility];  Service: Thoracic;  Laterality: N/A;  . Chest tube insertion Left 09/26/2014    Procedure: CHEST TUBE INSERTION;  Surgeon: [name], MD;  [facility];  Service: Thoracic;  Laterality: Left;   HPI:      Assessment / Plan / Recommendation Clinical Impression  Pt tolerated cuff deflation with in-line PMV placement for approximately 6 minutes prior to rise in RR and pt report of difficulty breathing. RT present for manipulation of ventilator settings to compensate for cuff leak and utilization of upper airway for expiration. Pt had adequate drop in tidal volume upon cuff deflation indicative of upper airway patency and achieved phonation with mild-moderately reduced breath support. Pt reported feeling "nervous" during trials and was requesting xanax - question the impact that this may have had on RR and overall tolerance. Will continue to follow for additional trials and will assess swallow function as endurance improves.     SLP Assessment  Patient needs continued Speech Lanaguage Pathology Services    Follow Up Recommendations  LTACH    Frequency and Duration min 2x/week  2 weeks   Pertinent Vitals/Pain See changes in RR as described above    SLP Goals Potential to Achieve Goals (ACUTE ONLY): Good   PMSV Trial  PMSV was placed for: 6 minutes Able to redirect subglottic air through upper airway: Yes Able to Attain Phonation: Yes Voice Quality: Low vocal intensity Able to Expectorate Secretions: Yes (orally upon cuff deflation, not  observed with PMV) Level of Secretion Expectoration with PMSV: Not observed Breath Support for Phonation: Moderately decreased Intelligibility: Intelligibility reduced Word: 75-100% accurate Phrase: 75-100% accurate Respirations During Trial:  (peaked in the mid-upper 30s) SpO2 During Trial: 96 % Pulse During  Trial: 82 Behavior: Alert;Other (comment) ("nervous")   Tracheostomy Tube       Vent Dependency  Vent Mode: PRVC Set Rate: 16 bmp PEEP: 5 cmH20 FiO2 (%): 40 % Vt Set: 600 mL    Cuff Deflation Trial Tolerated Cuff Deflation: Yes Length of Time for Cuff Deflation Trial: 7 minutes Behavior: Alert;Other (comment) ("nervous")     Maxcine Ham, M.A. CCC-SLP [phone]  Maxcine Ham 10/02/2014, 4:27 PM

## 2014-10-02 NOTE — Progress Notes (Addendum)
Advanced Heart Failure Rounding Note   Subjective:     Impella removed 4/20. Swan out 4/24. Underwent trach and left chest tube 4/26  On trach.  Sitting in chair watching TV.   Demadex restarted yesterday due to volume overload. Levophed off transiently this am but now back on low dose after getting xanax. Midodrine at 10 tid. Renal function slightly improved.  K +3.7. Ileus resolved  Maintaining SR on amio    Objective:   Weight Range:  Vital Signs:   Temp:  [97.5 F (36.4 C)-98.6 F (37 C)] 98.3 F (36.8 C) (05/02 1229) Pulse Rate:  [77-88] 78 (05/02 1201) Resp:  [14-35] 29 (05/02 1201) BP: (112-130)/(46-53) 115/46 mmHg (05/01 2341) SpO2:  [96 %-100 %] 100 % (05/02 1201) FiO2 (%):  [40 %] 40 % (05/02 1201) Weight:  [74.3 kg (163 lb 12.8 oz)] 74.3 kg (163 lb 12.8 oz) (05/02 0500)    Weight change: Filed Weights   09/29/14 0600 10/01/14 0600 10/02/14 0500  Weight: 70.5 kg (155 lb 6.8 oz) 72.4 kg (159 lb 9.8 oz) 74.3 kg (163 lb 12.8 oz)    Intake/Output:   Intake/Output Summary (Last 24 hours) at 10/02/14 1259 Last data filed at 10/02/14 0700  Gross per 24 hour  Intake 1054.1 ml  Output   2075 ml  Net -1020.9 ml     Physical Exam: General:  On trach  awake HEENT: normal Neck: + trach  Cor: Sternal dressing. Regular rate & rhythm. Lungs:decreased BS Abdomen: soft, nontender, nondistended. No hepatosplenomegaly. No bruits or masses. Good bowel sounds. Extremities: no cyanosis, clubbing, rash, 2-3+ edema into thighs Neuro: alert & orientedx3, cranial nerves grossly intact. moves all 4 extremities w/o difficulty. Affect pleasant  Telemetry: NSR  Labs: Basic Metabolic Panel:  Recent Labs Lab 09/26/14 0410  09/28/14 0348  09/29/14 0424 09/29/14 1555 09/30/14 0431 10/01/14 0435 10/02/14 0500  NA 132*  < > 133*  < > 137 137 137 139 141  K 4.5  < > 3.6  < > 2.6* 3.7 3.1* 2.9* 3.7  CL 98  < > 96  < > 105 100 103 106 109  CO2 25  < > 24  --  23  --  GLUCOSE 139*  < > 127*  < > 122* 127* 106* 148* 118*  BUN 34*  < > 39*  < > 37* 40* 44* 45* 53*  CREATININE 1.76*  < > 1.99*  < > 1.95* 2.10* 2.17* 1.95* 1.81*  CALCIUM 7.8*  < > 8.0*  --  7.4*  --  8.5 8.3* 8.0*  MG 1.6  --  1.7  --   --   --   --   --   --   PHOS 2.2*  --  4.5  --   --   --   --   --   --   < > = values in this interval not displayed.  Liver Function Tests:  Recent Labs Lab 09/28/14 0348 09/29/14 0424 09/30/14 0431 10/01/14 0435 10/02/14 0500  AST 115* 76* 81* 70* 68*  ALT 72* 49 57* 55 52  ALKPHOS 485* 300* 323* 295* 281*  BILITOT 3.3* 2.7* 3.0* 2.7* 2.4*  PROT 5.6* 4.8* 5.5* 5.4* 5.3*  ALBUMIN 2.3* 2.4* 2.4* 2.3* 2.3*   No results for input(s): LIPASE, AMYLASE in the last 168 hours. No results for input(s): AMMONIA in the last 168 hours.  CBC:  Recent Labs Lab 09/28/14 0348  09/29/14 0424 09/29/14 1555 09/30/14 0431 10/01/14 0435 10/02/14 0500  WBC 13.3*  --  11.4*  --  11.3* 11.7* 12.9*  NEUTROABS 11.8*  --  10.1*  --  9.8* 10.2* 10.5*  HGB 10.4*  < > 9.1* 11.6* 9.5* 9.8* 9.9*  HCT 31.0*  < > 27.9* 34.0* 29.4* 30.3* 31.0*  MCV 89.6  --  91.2  --  91.3 90.7 92.5  PLT 284  --  286  --  326 355 378  < > = values in this interval not displayed.  Cardiac Enzymes: No results for input(s): CKTOTAL, CKMB, CKMBINDEX, TROPONINI in the last 168 hours.  BNP: BNP (last 3 results)  Recent Labs  09/13/14 1310  BNP 96.8    ProBNP (last 3 results) No results for input(s): PROBNP in the last 8760 hours.    Other results:  Imaging: Dg Chest Port 1 View  10/02/2014   CLINICAL DATA:  Status post CABG on September 13, 2014 as well as patent foramen ovale closure, cardiogenic shock, acute MI.  EXAM: PORTABLE CHEST - 1 VIEW  COMPARISON:  Portable chest x-ray of September 30, 2014.  FINDINGS: The lungs are well-expanded. There is no alveolar infiltrate. There are bilateral pleural effusions layering posteriorly. The heart and pulmonary vascularity are  normal. The tracheostomy appliance tip projects just below the inferior margin of the clavicular heads. The feeding tube tip projects below the inferior margin of the image. The PICC line tip projects over the junction of the middle and distal thirds of the SVC. There are 7 intact sternal wires.  IMPRESSION: Stable moderate-sized bilateral pleural effusions layering posteriorly. Minimal pulmonary interstitial edema, stable   Electronically Signed   By: David  [place] M.D.   On: 10/02/2014 07:30     Medications:     Scheduled Medications: . ALPRAZolam  0.25 mg Oral BID  . amiodarone  200 mg Oral BID  . antiseptic oral rinse  7 mL Mouth Rinse QID  . aspirin EC  325 mg Oral Daily   Or  . aspirin  324 mg Per Tube Daily  . atorvastatin  40 mg Oral q1800  . bisacodyl  10 mg Oral Daily   Or  . bisacodyl  10 mg Rectal Daily  . budesonide (PULMICORT) nebulizer solution  0.5 mg Nebulization BID  . cefTAZidime (FORTAZ)  IV  1 g Intravenous Q12H  . chlorhexidine  15 mL Mouth Rinse BID  . digoxin  0.125 mg Per Tube QODAY  . enoxaparin (LOVENOX) injection  30 mg Subcutaneous Daily  . feeding supplement (VITAL 1.5 CAL)  1,000 mL Per Tube Q24H  . insulin aspart  0-9 Units Subcutaneous 6 times per day  . insulin detemir  15 Units Subcutaneous BID  . lidocaine-prilocaine  1 application Topical Once  . metoCLOPramide (REGLAN) injection  10 mg Intravenous Q12H  . midodrine  10 mg Oral TID WC  . nicotine  14 mg Transdermal Daily  . oxymetazoline  2 spray Each Nare Once  . pantoprazole sodium  40 mg Per Tube Daily  . QUEtiapine  50 mg Oral QHS  . sodium chloride  10-40 mL Intracatheter Q12H  . spironolactone  12.5 mg Oral BID  . torsemide  20 mg Oral BID  . vancomycin  1,000 mg Intravenous Q48H    Infusions: . lactated ringers Stopped (09/30/14 2000)  . norepinephrine (LEVOPHED) Adult infusion 3 mcg/min (10/02/14 0700)    PRN Medications: [CANCELED] Place/Maintain arterial line **AND** sodium  chloride, oxyCODONE **AND**  acetaminophen, ipratropium-albuterol, levalbuterol, ondansetron (ZOFRAN) IV, sodium chloride, traMADol   Assessment:   1. Cardiogenic shock 2. Acute systolic HF EF 25-30% by TEE 4/15 3. Acute anterolateral STEMI    --Emergent CABG 4/13 4. Acute respiratory failure   --s/p trach 4/26 5. Thrombocytopenia 6. Acute renal failure, stage III 7. Hyponatremia/hyponkalemia 8. PAF with RVR 9. Hyperbilirubinemia  Plan/Discussion:    He is s/p trach.   Continues to improve slowly but still pressor dependent. Hopefully can get levophed off in next 24 hours. Continue midodrine - I am hesitant to go much higher than 30mg /day at this point but may have to consider. He continues to 3rd space. Continue demadex and spiro for slow diuresis. Can increase slightly as needed.  Maintaining NSR on amio. Ileus has improved.   LTAC consult placed. Would want him off levophed prior to transfer.   Repeat echo pending  Length of Stay: 19 Arvilla MeresBensimhon, Daniel MD  10/02/2014, 12:59 PM  Advanced Heart Failure Team Pager [phone] (M-F; 7a - 4p)  Please contact [facility] for night-coverage after hours (4p -7a ) and weekends on [email or web address]

## 2014-10-02 NOTE — Progress Notes (Signed)
6 Days Post-Op Procedure(s) (LRB): TRACHEOSTOMY (N/A) CHEST TUBE INSERTION (Left) Subjective: norepi almost off Will DC a-line and check 2D echo Back on panda TF- will recheck SLT eval fpr po diet LTAC eval ordered Creat sl better off lasix  Objective: Vital signs in last 24 hours: Temp:  [97.5 F (36.4 C)-98.6 F (37 C)] 98.6 F (37 C) (05/02 0821) Pulse Rate:  [77-95] 84 (05/02 0751) Cardiac Rhythm:  [-] Normal sinus rhythm (05/01 2000) Resp:  [14-35] 23 (05/02 0700) BP: (108-130)/(46-59) 115/46 mmHg (05/01 2341) SpO2:  [95 %-100 %] 100 % (05/02 0751) FiO2 (%):  [40 %-60 %] 40 % (05/02 0751) Weight:  [163 lb 12.8 oz (74.3 kg)] 163 lb 12.8 oz (74.3 kg) (05/02 0500)  Hemodynamic parameters for last 24 hours:  stable  Intake/Output from previous day: 05/01 0701 - 05/02 0700 In: 2076.1 [I.V.:296.1; NG/GT:1550; IV Piggyback:200] Out: 2570 [Urine:2570] Intake/Output this shift:    ++ perineal edema Incisions healing  Lab Results:  Recent Labs  10/01/14 0435 10/02/14 0500  WBC 11.7* 12.9*  HGB 9.8* 9.9*  HCT 30.3* 31.0*  PLT 355 378   BMET:  Recent Labs  10/01/14 0435 10/02/14 0500  NA 139 141  K 2.9* 3.7  CL 106 109  CO2 23 24  GLUCOSE 148* 118*  BUN 45* 53*  CREATININE 1.95* 1.81*  CALCIUM 8.3* 8.0*    PT/INR: No results for input(s): LABPROT, INR in the last 72 hours. ABG    Component Value Date/Time   PHART 7.395 10/01/2014 0421   HCO3 21.8 10/01/2014 0421   TCO2 23 10/01/2014 0421   ACIDBASEDEF 3.0* 10/01/2014 0421   O2SAT 97.0 10/01/2014 0421   CBG (last 3)   Recent Labs  10/02/14 0034 10/02/14 0421 10/02/14 0819  GLUCAP 143* 127* 121*    Assessment/Plan: S/P Procedure(s) (LRB): TRACHEOSTOMY (N/A) CHEST TUBE INSERTION (Left) Mobilize d/c pacing wires d/c tubes/lines Continue ABX therapy due to Post-op infection   -- postop pneumonia  LOS: 19 days    [name] 10/02/2014

## 2014-10-02 NOTE — Progress Notes (Signed)
Called to bedside d/t pt RR high (40's), PT in room working w/ pt. RN aware.

## 2014-10-03 ENCOUNTER — Inpatient Hospital Stay (HOSPITAL_COMMUNITY): Payer: Medicare Other

## 2014-10-03 DIAGNOSIS — I213 ST elevation (STEMI) myocardial infarction of unspecified site: Secondary | ICD-10-CM

## 2014-10-03 DIAGNOSIS — I9589 Other hypotension: Secondary | ICD-10-CM

## 2014-10-03 DIAGNOSIS — I2109 ST elevation (STEMI) myocardial infarction involving other coronary artery of anterior wall: Secondary | ICD-10-CM

## 2014-10-03 LAB — CBC WITH DIFFERENTIAL/PLATELET
Basophils Absolute: 0 10*3/uL (ref 0.0–0.1)
Basophils Relative: 0 % (ref 0–1)
Eosinophils Absolute: 0.1 10*3/uL (ref 0.0–0.7)
Eosinophils Relative: 1 % (ref 0–5)
HCT: 30.7 % — ABNORMAL LOW (ref 39.0–52.0)
Hemoglobin: 9.5 g/dL — ABNORMAL LOW (ref 13.0–17.0)
Lymphocytes Relative: 5 % — ABNORMAL LOW (ref 12–46)
Lymphs Abs: 0.6 10*3/uL — ABNORMAL LOW (ref 0.7–4.0)
MCH: 29.2 pg (ref 26.0–34.0)
MCHC: 30.9 g/dL (ref 30.0–36.0)
MCV: 94.5 fL (ref 78.0–100.0)
Monocytes Absolute: 1.5 10*3/uL — ABNORMAL HIGH (ref 0.1–1.0)
Monocytes Relative: 12 % (ref 3–12)
Neutro Abs: 9.9 10*3/uL — ABNORMAL HIGH (ref 1.7–7.7)
Neutrophils Relative %: 82 % — ABNORMAL HIGH (ref 43–77)
Platelets: 361 10*3/uL (ref 150–400)
RBC: 3.25 MIL/uL — ABNORMAL LOW (ref 4.22–5.81)
RDW: 18.4 % — ABNORMAL HIGH (ref 11.5–15.5)
WBC: 12.1 10*3/uL — ABNORMAL HIGH (ref 4.0–10.5)

## 2014-10-03 LAB — COMPREHENSIVE METABOLIC PANEL
ALT: 52 U/L (ref 17–63)
AST: 67 U/L — ABNORMAL HIGH (ref 15–41)
Albumin: 2.2 g/dL — ABNORMAL LOW (ref 3.5–5.0)
Alkaline Phosphatase: 260 U/L — ABNORMAL HIGH (ref 38–126)
Anion gap: 10 (ref 5–15)
BUN: 57 mg/dL — ABNORMAL HIGH (ref 6–20)
CO2: 27 mmol/L (ref 22–32)
Calcium: 8.2 mg/dL — ABNORMAL LOW (ref 8.9–10.3)
Chloride: 103 mmol/L (ref 101–111)
Creatinine, Ser: 1.92 mg/dL — ABNORMAL HIGH (ref 0.61–1.24)
GFR calc Af Amer: 39 mL/min — ABNORMAL LOW (ref >60)
GFR calc non Af Amer: 33 mL/min — ABNORMAL LOW (ref >60)
Glucose, Bld: 130 mg/dL — ABNORMAL HIGH (ref 70–99)
Potassium: 3.4 mmol/L — ABNORMAL LOW (ref 3.5–5.1)
Sodium: 140 mmol/L (ref 135–145)
Total Bilirubin: 2.3 mg/dL — ABNORMAL HIGH (ref 0.3–1.2)
Total Protein: 5.2 g/dL — ABNORMAL LOW (ref 6.5–8.1)

## 2014-10-03 LAB — GLUCOSE, CAPILLARY
GLUCOSE-CAPILLARY: 135 mg/dL — AB (ref 70–99)
GLUCOSE-CAPILLARY: 118 mg/dL — AB (ref 70–99)
GLUCOSE-CAPILLARY: 123 mg/dL — AB (ref 70–99)
GLUCOSE-CAPILLARY: 128 mg/dL — AB (ref 70–99)
Glucose-Capillary: 124 mg/dL — ABNORMAL HIGH (ref 70–99)
Glucose-Capillary: 130 mg/dL — ABNORMAL HIGH (ref 70–99)

## 2014-10-03 LAB — PROCALCITONIN: Procalcitonin: 0.18 ng/mL

## 2014-10-03 MED ORDER — DOBUTAMINE IN D5W 4-5 MG/ML-% IV SOLN
2.5000 ug/kg/min | INTRAVENOUS | Status: DC
  Administered 2014-10-03: 2.5 ug/kg/min via INTRAVENOUS
  Filled 2014-10-03: qty 250

## 2014-10-03 MED ORDER — PRO-STAT SUGAR FREE PO LIQD
30.0000 mL | Freq: Two times a day (BID) | ORAL | Status: DC
  Administered 2014-10-03 – 2014-10-05 (×5): 30 mL
  Filled 2014-10-03 (×6): qty 30

## 2014-10-03 MED ORDER — ALPRAZOLAM 0.5 MG PO TABS
0.5000 mg | ORAL_TABLET | Freq: Three times a day (TID) | ORAL | Status: DC
  Administered 2014-10-03 – 2014-10-05 (×6): 0.5 mg via ORAL
  Filled 2014-10-03 (×6): qty 1

## 2014-10-03 MED ORDER — ALPRAZOLAM 0.5 MG PO TABS: 0.5000 mg | ORAL_TABLET | Freq: Two times a day (BID) | ORAL | Status: DC

## 2014-10-03 NOTE — Discharge Summary (Signed)
[address]       [place] [address]             [phone]              Discharge Summary  Name: [name] DOB: 03-29-1941 74 y.o. MRN: [id number]   Admission Date: 09/13/2014 Discharge Date: 10/05/2014    Admitting Diagnosis: Active Problems:   STEMI (ST elevation myocardial infarction)   Cardiogenic shock   Acute MI, anterolateral wall, initial episode of care   SOB (shortness of breath)   S/P CABG x 3   Encounter for feeding tube placement   Endotracheally intubated   History of ETT   Paroxysmal atrial fibrillation   Presence of left ventricular assist device (LVAD)   LVAD (left ventricular assist device) present   Cholestasis   Elevated LFTs   Gallstones   Pulmonary edema   Tracheostomy status   Acute systolic heart failure   Other specified hypotension    Discharge Diagnosis:  Acute anterolateral ST elevation myocardial infarction Cardiogenic shock Acute systolic heart failure Expected postoperative blood loss anemia Postoperative atrial fibrillation Ventilator dependent respiratory failure Acute renal failure, stage III Recurrent pleural effusions  Active Problems:   STEMI (ST elevation myocardial infarction)   Cardiogenic shock   Acute MI, anterolateral wall, initial episode of care   SOB (shortness of breath)   S/P CABG x 3   Encounter for feeding tube placement   Endotracheally intubated   History of ETT   Paroxysmal atrial fibrillation   Presence of left ventricular assist device (LVAD)   LVAD (left ventricular assist device) present   Cholestasis   Elevated LFTs   Gallstones   Pulmonary edema   Tracheostomy status   Acute systolic heart failure   Other specified hypotension    Procedures: 1. EMERGENCY CORONARY ARTERY BYPASS GRAFTING X 3 - 09/13/2014  Left internal mammary artery to left anterior descending  Saphenous vein graft to ramus intermediate  Saphenous vein graft to obtuse  marginal  Endoscopic greater saphenous vein harvest left leg  2. TRACHEOSTOMY (#7 Shiley tracheostomy tube) LEFT CHEST TUBE INSERTION  - 09/26/2014  3. BILATERAL PLEURX CATHETER PLACEMENT - 10/05/2014    HPI:  The patient is a 74 y.o. male who presented to the ER at [facility] on the date of admission complaining of epigastric pain which had been continuous all night and was associated with nausea and shortness of breath. EKG showed ST elevation in aVR, aVL, V1-2 and diffuse deep ST depressions in the inferior and anterolateral leads. He was taken emergently to the cath lab for cardiac catheterization.  This revealed an occluded left main, dominant RCA without significant disease, and probable chronic occlusion of the mid LAD. He underwent successful PCI of the distal left main which opened flow to the ramus and circumflex.  He developed cardiogenic shock requiring Levophed and placement of a transfemoral Impella LVAD.  TCTS was consulted for emergency CABG.  Dr. Donata Clay saw the patient and reviewed his films and agreed with the need for surgical revascularization. All risks, benefits and alternatives of surgery were explained in detail, and the patient agreed to proceed.     Hospital Course:  The patient was taken to the operating room emergently and underwent the above procedure.  He was transferred to the SICU for postoperative management.  He remained critically ill on multiple vasopressors and high dose inotropes. Initially, he was stable, but later in the evening,  he developed worsening hypotension with loss of pacemaker capture and pulse. Code blue was called and the patient was resuscitated with CPR, epinephrine, and correction of acidosis. He was noted to have increased chest tube output with coagulopathy on labs. He received blood products and Novoseven for correction.  It was felt that his arrest was likely due to tamponade from occlusion of his chest tubes. He remained asystolic under pacer  with full LVAD support.  Critical care medicine was consulted to assist with management.   The patient's hemodynamics did stabilize with continued support and some pulsatility was noted with junctional rhythm under the pacer.  A  transesophageal echo performed at the bedside showed EF 25-30%, with akinesis of the anterior, anteroseptal and anterolateral walls. Panda tube was placed for nutritional support. Cardiac output slowly began to improve. He developed atrial fibrillation and was started on Amiodarone. Advanced Heart Failure team was consulted for assistance with management.  The patient continued to have sustained atrial fibrillation with hemodynamic instability and a direct current cardioversion was performed at the bedside by Dr. Gala Romney. The patient did convert to sinus rhythm. The Impella was weaned slowly but the patient developed significant hemolysis with elevated bilirubin 13, so the Impella was removed on 09/20/2014 at the bedside.  He had recurrent atrial fibrillation with rapid ventricular response and profound hypotension, requiring emergent DCCV.  He did transiently return to sinus rhythm, but has continued to have intermittent a-fib since then.    He developed jaundice with continued hemolysis and gastroenterology was consulted.  Ultrasound showed cholelithiasis with mild gall bladder wall thickening and no liver abnormalities. It was felt that his cholestasis and hemolysis were multifactorial, and percutaneous cholecystostomy could be considered if he did not improve.  He developed a leukocytosis and was started on empiric antibiotics for presumed pneumonia. Urinalysis and blood cultures were negative.   The patient remained ventilator dependent after failing multiple attempts at weaning, and was returned to the operating room on 4/26 for tracheostomy. He continued to have third spacing of fluid, requiring multiple diuretics and placement of a left chest tube for recurrent effusion.   Renal function initially worsened with diuresis, but has stabilized. Chest tube was removed, but his pleural effusions have recurred and have not been responsive to diuresis.  Overall, the patient continues to improve, but remains pressor and ventilator dependent. He remains in sinus rhythm on Amiodarone. White count has trended down and he has completed a full course of antibiotics.  He is awake and responsive on the ventilator. He was returned to the operating room on 10/05/2014 for bilateral PleuRx catheter placements to treat his recurrent effusions.  At that time, 700 ml of serous fluid was drained from the left side and 1300 ml was drained from the right side.  He is tolerating tube feeds at goal via Panda tube. The patient is felt to be a good candidate for [facility] care and presently is medically stable for transfer to [facility].      Recent vital signs:  Filed Vitals:   10/05/14 1205  BP:   Pulse: 91  Temp:   Resp: 19    Recent laboratory studies:  CBC:  Recent Labs  10/04/14 0500 10/05/14 0400  WBC 13.5* 14.1*  HGB 9.2* 8.6*  HCT 29.8* 27.7*  PLT 352 344   BMET:   Recent Labs  10/03/14 0450 10/04/14 0500  NA 140 137  K 3.4* 3.3*  CL 103 99*  CO2 27 28  GLUCOSE  130* 269*  BUN 57* 60*  CREATININE 1.92* 1.83*  CALCIUM 8.2* 7.7*    PT/INR:   Recent Labs  10/04/14 1100  LABPROT 15.0  INR 1.16     Discharge Medications:     Medication List    STOP taking these medications        escitalopram 10 MG tablet  Commonly known as:  LEXAPRO     pantoprazole 40 MG tablet  Commonly known as:  PROTONIX  Replaced by:  pantoprazole sodium 40 mg/20 mL Pack     potassium chloride SA 20 MEQ tablet  Commonly known as:  K-DUR,KLOR-CON     propranolol ER 120 MG 24 hr capsule  Commonly known as:  INDERAL LA     simvastatin 20 MG tablet  Commonly known as:  ZOCOR     tamsulosin 0.4 MG Caps capsule  Commonly known as:  FLOMAX     tiotropium 18  MCG inhalation capsule  Commonly known as:  SPIRIVA      TAKE these medications        acetaminophen 160 MG/5ML solution  Commonly known as:  TYLENOL  Place 10.2 mLs (325 mg total) into feeding tube every 4 (four) hours as needed for moderate pain.     ALPRAZolam 0.5 MG tablet  Commonly known as:  XANAX  Place 1 tablet (0.5 mg total) into feeding tube 3 (three) times daily.     amiodarone 200 MG tablet  Commonly known as:  PACERONE  Place 1 tablet (200 mg total) into feeding tube 2 (two) times daily.     antiseptic oral rinse 0.05 % Liqd solution  Commonly known as:  CPC / CETYLPYRIDINIUM CHLORIDE 0.05%  7 mLs by Mouth Rinse route QID.     aspirin 81 MG chewable tablet  Place 4 tablets (324 mg total) into feeding tube daily.     atorvastatin 40 MG tablet  Commonly known as:  LIPITOR  Place 1 tablet (40 mg total) into feeding tube daily at 6 PM.     budesonide 0.5 MG/2ML nebulizer solution  Commonly known as:  PULMICORT  Take 2 mLs (0.5 mg total) by nebulization 2 (two) times daily.     chlorhexidine 0.12 % solution  Commonly known as:  PERIDEX  15 mLs by Mouth Rinse route 2 (two) times daily.     digoxin 0.125 MG tablet  Commonly known as:  LANOXIN  Place 1 tablet (0.125 mg total) into feeding tube every other day.     enoxaparin 30 MG/0.3ML injection  Commonly known as:  LOVENOX  Inject 0.3 mLs (30 mg total) into the skin daily.  Start taking on:  10/06/2014     feeding supplement (PRO-STAT SUGAR FREE 64) Liqd  Place 30 mLs into feeding tube 2 (two) times daily.     feeding supplement (VITAL 1.5 CAL) Liqd  Place 1,000 mLs into feeding tube daily.     insulin detemir 100 UNIT/ML injection  Commonly known as:  LEVEMIR  Inject 0.15 mLs (15 Units total) into the skin 2 (two) times daily.     ipratropium-albuterol 0.5-2.5 (3) MG/3ML Soln  Commonly known as:  DUONEB  Take 3 mLs by nebulization every 2 (two) hours as needed.     levalbuterol 0.63 MG/3ML nebulizer  solution  Commonly known as:  XOPENEX  Take 3 mLs (0.63 mg total) by nebulization every 3 (three) hours as needed for wheezing or shortness of breath.     metoCLOPramide 5 MG/ML injection  Commonly known as:  REGLAN  Inject 2 mLs (10 mg total) into the vein every 12 (twelve) hours.     midodrine 10 MG tablet  Commonly known as:  PROAMATINE  Place 1 tablet (10 mg total) into feeding tube 3 (three) times daily with meals.     nicotine 14 mg/24hr patch  Commonly known as:  NICODERM CQ - dosed in mg/24 hours  Place 1 patch (14 mg total) onto the skin daily.     norepinephrine 16 mg in dextrose 5 % 250 mL  Inject 0-40 mcg/min into the vein continuous.     ondansetron 4 MG/2ML Soln injection  Commonly known as:  ZOFRAN  Inject 2 mLs (4 mg total) into the vein every 6 (six) hours as needed for nausea or vomiting.     pantoprazole sodium 40 mg/20 mL Pack  Commonly known as:  PROTONIX  Place 20 mLs (40 mg total) into feeding tube daily.     QUEtiapine 50 MG tablet  Commonly known as:  SEROQUEL  Place 1 tablet (50 mg total) into feeding tube at bedtime.     torsemide 20 MG tablet  Commonly known as:  DEMADEX  Place 1 tablet (20 mg total) into feeding tube 2 (two) times daily.             COLLINS,GINA H 10/05/2014, 12:26 PM

## 2014-10-03 NOTE — Progress Notes (Signed)
  Echocardiogram 2D Echocardiogram has been performed.  [name], [name] 10/03/2014, 5:17 PM

## 2014-10-03 NOTE — Progress Notes (Signed)
7 Days Post-Op Procedure(s) (LRB): TRACHEOSTOMY (N/A) CHEST TUBE INSERTION (Left) Subjective:  Patient slowly improving after emergency CABG for MI with shock  and preoperative percutaneous LVAD  Patient undergoing periods of trach collar Patient still dependent on Panda feeding tube-speech therapy swallow evaluation pending Patient has developed recurrent bilateral pleural effusions along with elevated creatinine so will plan on placement of bilateral Pleurx catheters to reduce diuretic requirement Still dependent on inotropic support although much decreased--2-D echocardiogram is pending He will need LTAC care as he will be expected to have a prolonged recovery.  Objective: Vital signs in last 24 hours: Temp:  [97.8 F (36.6 C)-99.5 F (37.5 C)] 97.9 F (36.6 C) (05/03 1154) Pulse Rate:  [78-96] 96 (05/03 1300) Cardiac Rhythm:  [-] Normal sinus rhythm (05/03 0900) Resp:  [16-30] 27 (05/03 1300) BP: (85-119)/(44-71) 101/44 mmHg (05/03 1300) SpO2:  [87 %-100 %] 100 % (05/03 1300) FiO2 (%):  [40 %] 40 % (05/03 1200) Weight:  [164 lb 3.9 oz (74.5 kg)] 164 lb 3.9 oz (74.5 kg) (05/03 0430)  Hemodynamic parameters for last 24 hours:   maintaining sinus rhythm, afebrile  Intake/Output from previous day: 05/02 0701 - 05/03 0700 In: 1775.1 [I.V.:85.1; NG/GT:1560; IV Piggyback:100] Out: 1955 [Urine:1955] Intake/Output this shift: Total I/O In: 933.8 [I.V.:53.8; NG/GT:780; IV Piggyback:100] Out: 875 [Urine:875] Minimal airway secretions through trach Sternotomy well-healed Moderate perineal edema Neuro intact   Lab Results:  Recent Labs  10/02/14 0500 10/03/14 0450  WBC 12.9* 12.1*  HGB 9.9* 9.5*  HCT 31.0* 30.7*  PLT 378 361   BMET:  Recent Labs  10/02/14 0500 10/03/14 0450  NA 141 140  K 3.7 3.4*  CL 109 103  CO2 24 27  GLUCOSE 118* 130*  BUN 53* 57*  CREATININE 1.81* 1.92*  CALCIUM 8.0* 8.2*    PT/INR: No results for input(s): LABPROT, INR in the last 72  hours. ABG    Component Value Date/Time   PHART 7.395 10/01/2014 0421   HCO3 21.8 10/01/2014 0421   TCO2 23 10/01/2014 0421   ACIDBASEDEF 3.0* 10/01/2014 0421   O2SAT 97.0 10/01/2014 0421   CBG (last 3)   Recent Labs  10/03/14 0352 10/03/14 0818 10/03/14 1152  GLUCAP 135* 124* 118*    Assessment/Plan: S/P Procedure(s) (LRB): TRACHEOSTOMY (N/A) CHEST TUBE INSERTION (Left) Start low-dose dobutamine and wean norepinephrine as tolerated Plan bilateral Pleurx catheters this week for recurrent postoperative bilateral pleural effusions with elevated creatinine Swallow study per SLT   LOS: 20 days    Kathlee Nationseter Van Trigt III 10/03/2014

## 2014-10-03 NOTE — Progress Notes (Signed)
Sputum culture collected, labeled, and sent to lab with appropriate requisition.  RN aware.

## 2014-10-03 NOTE — Progress Notes (Signed)
Physical Therapy Treatment Patient Details Name: Justin MattocksDennis W Knight MRN: [id number] DOB: Aug 03, 1940 Today's Date: 10/03/2014    History of Present Illness Adm 4/13 with MI; underwent CABG and required Impella LVAD until 4/20; remains on Ventilator; trach 4/26  PMHx- HTN, anxiety, depression    PT Comments    Bed level exercises performed while pt weaning on vent (Respiratory Therapy present at initiation of session and reports pt has done well weaning today and OK to try). Pt tends to move quickly and attempts to complete several repetitions prior to resting/catching his breath. With cues for slowing down and coordinating with his breathing, he was able to tolerate leg exercises with resistance for strengthening. RR 28-33 with SaO2 98-100% throughout. Pt becoming more anxious and asking for Xanax (which he was able to write on paper). Talked through relaxation, slow breathing, turned down lights.  RN made aware of pt request for meds.   Follow Up Recommendations  LTACH;Supervision/Assistance - 24 hour     Equipment Recommendations   (TBA)    Recommendations for Other Services       Precautions / Restrictions Precautions Precautions: Sternal;Fall Precaution Comments: trach/vent    Mobility  Bed Mobility                  Transfers                    Ambulation/Gait                 Stairs            Wheelchair Mobility    Modified Rankin (Stroke Patients Only)       Balance                                    Cognition Arousal/Alertness: Awake/alert Behavior During Therapy: WFL for tasks assessed/performed Overall Cognitive Status: Difficult to assess                      Exercises Low Level/ICU Exercises Ankle Circles/Pumps: AROM;Both;10 reps;Strengthening (resisted PF; followed by heel cord stretch x 30 sec; ROM WNL) Hip ABduction/ADduction: AROM;Strengthening;Both;5 reps;Supine (min resistance) Heel Slides:  AROM;Strengthening;Both;5 reps (resisted extension)    General Comments        Pertinent Vitals/Pain Pain Assessment: No/denies pain    Home Living                      Prior Function            PT Goals (current goals can now be found in the care plan section) Acute Rehab PT Goals Time For Goal Achievement: 10/12/14 Progress towards PT goals: Progressing toward goals    Frequency  Min 3X/week    PT Plan Current plan remains appropriate    Co-evaluation   Reason for Co-Treatment: Complexity of the patient's impairments (multi-system involvement)         End of Session Equipment Utilized During Treatment: Oxygen (vent) Activity Tolerance: Patient limited by fatigue Patient left: in bed;with call bell/phone within reach     Time: 1559-1615 PT Time Calculation (min) (ACUTE ONLY): 16 min  Charges:  $Therapeutic Exercise: 8-22 mins                    G Codes:      [name] 10/03/2014, 4:24 PM Pager [phone]

## 2014-10-03 NOTE — Progress Notes (Signed)
Speech Language Pathology Treatment: Hillary BowPassy Muir Speaking valve  Patient Details Name: Justin MattocksDennis W Kuhnert MRN: [id number] DOB: September 27, 1940 Today's Date: 10/03/2014 Time: 1202-1220 SLP Time Calculation (min) (ACUTE ONLY): 18 min  Assessment / Plan / Recommendation Clinical Impression  F/u in-line PMV trials with RT.  Pt tolerated cuff deflation with in-line PMV placement for approximately 15 minutes until fatiguing and reporting feeling dizzy.  RT present to make appropriate changes to ventilator settings to accommodate use of valve.  Pt appeared to be more comfortable on PS - despite moderately reduced breath support and dysphonia, he was able to recite DOW, months of year, and communicate basic/wants needs with 75% intelligibility.  Required min cues to increase intensity/clarity.  Will continue to follow for additional trials and will assess swallow function as endurance improves.    HPI Other Pertinent Information: Adm 4/13 with MI; underwent CABG and required Impella LVAD until 4/20; remains on Ventilator; trach 4/26 PMHx- HTN, anxiety, depression   Pertinent Vitals    SLP Plan  Continue with current plan of care    Recommendations        Patient may use Passy-Muir Speech Valve:  (with SLP and RT only) PMSV Supervision: Full       Follow up Recommendations: LTACH Plan: Continue with current plan of care   Gordan Grell L. Samson Fredericouture, [place] CCC/SLP Pager [phone]      Blenda MountsCouture, Torina Ey Laurice 10/03/2014, 1:07 PM

## 2014-10-03 NOTE — Progress Notes (Signed)
PMV trials done w/ speech therapy.  Cuff deflated, peep =0, adjusted PSV to improve vocal & breath quality.  PT tolerated PMV trail well, sat 96-99% t/o.  Pt returned to vent settings as charted at completion of PMV trials.

## 2014-10-03 NOTE — Progress Notes (Signed)
[facility] FOR AM LAB REPLACEMENT ONLY  The patient does not apply for the [facility] Electrolyte Re[facility]ment Protocol based on the criteria listed below:   Is GFR >/= 40 ml/min? No.  Patient's GFR today is 3.4    Abnormal electrolyte(s):K3.4  If a panic level lab has been reported, has the CCM MD in charge been notified? Yes.  .   Physician:  E Deterding,MD  Melrose NakayamaChisholm, Quamel Fitzmaurice William 10/03/2014 6:41 AM

## 2014-10-03 NOTE — Progress Notes (Signed)
Patient ID: [name], male   DOB: 1940/07/15, 74 y.o.   MRN: [id number]  SICU Evening Rounds:  Hemodynamics fairly stable but still dependent of dobutamine 2.5 and levophed 8.  Remains on CPAP via trach but up in chair.  Urine output good.  Tube feeds going.  Had repeat echo this evening. Results pending.

## 2014-10-03 NOTE — Progress Notes (Signed)
NUTRITION FOLLOW UP  INTERVENTION:  Continue Vital 1.5 formula at 50 ml/hr  Add Prostat liquid protein 30 ml BID via tube  Total TF regimen to provide 2000 kcals, 111 gm protein, 917 ml of free water RD to follow for nutrition care plan  NUTRITION DIAGNOSIS: Inadequate oral intake related to inability to eat as evidenced by NPO status, ongoing  Goal: Pt to meet >/= 90% of their estimated nutrition needs, currently unmet  Monitor:  TF regimen & tolerance, respiratory status, weight, labs, I/O's  ASSESSMENT: 74 y/o Male with h/o HTN, HLD and tobacco use presents with epigastric pain that began about 11 pm ad has been continuous. This was associated with SOB and nausea. Symptoms initially were intermittent.He has been getting tired easily and has smoked a lot for a long time. In the ED, ECG revealed SR with ST elevation in aVR, aVL, V1-2 with diffuse deep ST depressions inferior and anterolateral leads.  Patient s/p procedures 4/13: CORONARY ARTERY BYPASS GRAFTING (CABG), ON PUMP, TIMES THREE, USING LEFT INTERNAL MAMMARY ARTERY, LEFT GREATER SAPHENOUS VEIN HARVESTED ENDOSCOPICALLY  PATENT FORAMEN OVALE CLOSURE  Patient is currently on ventilator support via trach MV: 13.8 L/min Temp (24hrs), Avg:98.4 F (36.9 C), Min:97.8 F (36.6 C), Max:99.5 F (37.5 C)   Previously infusing at 50 ml/hr via small bore feeding tube (tip over stomach) providing 1800 kcals, 81 gm protein, 917 ml of free water.  Would benefit from additional protein.  RD to order.  Height: Ht Readings from Last 1 Encounters:  09/16/14  (1.702 m)    Weight: -----> stable Wt Readings from Last 1 Encounters:  10/03/14 164 lb 3.9 oz (74.5 kg)    5/02  163 lb 5/01  159 lb 4/29  155 lb 4/28  159 lb 4/27  159 lb 4/26  166 lb 4/25  164 lb 4/24  162 lb 4/23  161 lb 4/22  164 lb  BMI:  Body mass index is 25.72 kg/(m^2).  Re-estimated Nutritional Needs: Kcal: 1900-2100 Protein: 110-120  gm Fluid: per MD  Skin: surgical chest, leg incisions   Diet Order: Diet NPO time specified Except for: Sips with Meds   Intake/Output Summary (Last 24 hours) at 10/03/14 1400 Last data filed at 10/03/14 1300  Gross per 24 hour  Intake 1876.1 ml  Output   1730 ml  Net  146.1 ml   Labs:   Recent Labs Lab 09/28/14 0348  10/01/14 0435 10/02/14 0500 10/03/14 0450  NA 133*  < > 139 141 140  K 3.6  < > 2.9* 3.7 3.4*  CL 96  < > 106 109 103  CO2 24  < > BUN 39*  < > 45* 53* 57*  CREATININE 1.99*  < > 1.95* 1.81* 1.92*  CALCIUM 8.0*  < > 8.3* 8.0* 8.2*  MG 1.7  --   --   --   --   PHOS 4.5  --   --   --   --   GLUCOSE 127*  < > 148* 118* 130*  < > = values in this interval not displayed.  CBG (last 3)   Recent Labs  10/03/14 0352 10/03/14 0818 10/03/14 1152  GLUCAP 135* 124* 118*    Scheduled Meds: . ALPRAZolam  0.25 mg Oral BID  . amiodarone  200 mg Oral BID  . antiseptic oral rinse  7 mL Mouth Rinse QID  . aspirin EC  325 mg Oral Daily   Or  .  aspirin  324 mg Per Tube Daily  . atorvastatin  40 mg Oral q1800  . bisacodyl  10 mg Oral Daily   Or  . bisacodyl  10 mg Rectal Daily  . budesonide (PULMICORT) nebulizer solution  0.5 mg Nebulization BID  . cefTAZidime (FORTAZ)  IV  1 g Intravenous Q12H  . chlorhexidine  15 mL Mouth Rinse BID  . digoxin  0.125 mg Per Tube QODAY  . enoxaparin (LOVENOX) injection  30 mg Subcutaneous Daily  . feeding supplement (VITAL 1.5 CAL)  1,000 mL Per Tube Q24H  . insulin aspart  0-9 Units Subcutaneous 6 times per day  . insulin detemir  15 Units Subcutaneous BID  . lidocaine-prilocaine  1 application Topical Once  . metoCLOPramide (REGLAN) injection  10 mg Intravenous Q12H  . midodrine  10 mg Oral TID WC  . nicotine  14 mg Transdermal Daily  . oxymetazoline  2 spray Each Nare Once  . pantoprazole sodium  40 mg Per Tube Daily  . QUEtiapine  50 mg Oral QHS  . sodium chloride  10-40 mL Intracatheter Q12H  .  spironolactone  12.5 mg Oral BID  . torsemide  20 mg Oral BID  . vancomycin  1,000 mg Intravenous Q48H    Continuous Infusions: . DOBUTamine 2.5 mcg/kg/min (10/03/14 1130)  . lactated ringers Stopped (09/30/14 2000)  . norepinephrine (LEVOPHED) Adult infusion 8 mcg/min (10/03/14 1000)    Past Medical History  Diagnosis Date  . Dyspnea   . Palpitations   . Anxiety and depression   . GERD (gastroesophageal reflux disease)   . Hypertension   . Hypercholesteremia     Past Surgical History  Procedure Laterality Date  . Appendectomy    . Cataract extraction w/phaco  05/06/2012    Procedure: CATARACT EXTRACTION PHACO AND INTRAOCULAR LENS PLACEMENT (IOC);  Surgeon: Gemma PayorKerry Hunt, MD;  Location: [facility];  Service: Ophthalmology;  Laterality: Right;  CDE:22.64  . Cataract extraction w/phaco  05/24/2012    Procedure: CATARACT EXTRACTION PHACO AND INTRAOCULAR LENS PLACEMENT (IOC);  Surgeon: Gemma PayorKerry Hunt, MD;  Location: [facility];  Service: Ophthalmology;  Laterality: Left;  CDE: 27.08  . Cardiac catheterization  09/13/2014    Procedure: VENTRICULAR ASSIST DEVICE INSERTION;  Surgeon: Corky CraftsJayadeep S Varanasi, MD;  [facility];  Service: Cardiovascular;;  . Cardiac catheterization  09/13/2014    Procedure: CORONARY BALLOON ANGIOPLASTY;  Surgeon: Corky CraftsJayadeep S Varanasi, MD;  [facility];  Service: Cardiovascular;;  . Coronary angiogram  09/13/2014    Procedure: CORONARY ANGIOGRAM;  Surgeon: Corky CraftsJayadeep S Varanasi, MD;  [facility];  Service: Cardiovascular;;  . Coronary artery bypass graft N/A 09/13/2014    Procedure: CORONARY ARTERY BYPASS GRAFTING (CABG), ON PUMP, TIMES THREE, USING LEFT INTERNAL MAMMARY ARTERY, LEFT GREATER SAPHENOUS VEIN HARVESTED ENDOSCOPICALLY;  Surgeon: [name], MD;  [facility];  Service: Open Heart Surgery;  Laterality: N/A;  . Patent foramen ovale closure  09/13/2014    Procedure: PATENT FORAMEN OVALE CLOSURE;  Surgeon: [name], MD;  [facility];  Service: Open Heart Surgery;;  . Tracheostomy tube placement N/A 09/26/2014    Procedure: TRACHEOSTOMY;  Surgeon: [name], MD;  [facility];  Service: Thoracic;  Laterality: N/A;  . Chest tube insertion Left 09/26/2014    Procedure: CHEST TUBE INSERTION;  Surgeon: [name], MD;  [facility];  Service: Thoracic;  Laterality: Left;    Maureen ChattersKatie Mikeya Tomasetti, RD, LDN Pager #: [phone] After-Hours  Pager #: [phone]

## 2014-10-03 NOTE — Progress Notes (Signed)
Advanced Heart Failure Rounding Note   Subjective:     Presented with STEMI. Went for emergent CABG 4/14. Impella removed 4/20. Swan out 4/24. Underwent trach and left chest tube 4/26  On trach.  Lying in bed watching TV. No complaints  Levophed increased to 8 this am. Weight stable.   Maintaining SR on amio    Objective:   Weight Range:  Vital Signs:   Temp:  [97.8 F (36.6 C)-99.5 F (37.5 C)] 98.3 F (36.8 C) (05/03 0820) Pulse Rate:  [64-95] 92 (05/03 0818) Resp:  [16-30] 26 (05/03 0818) BP: (77-119)/(43-77) 91/49 mmHg (05/03 0818) SpO2:  [87 %-100 %] 99 % (05/03 0818) FiO2 (%):  [40 %] 40 % (05/03 0818) Weight:  [74.5 kg (164 lb 3.9 oz)] 74.5 kg (164 lb 3.9 oz) (05/03 0430) Last BM Date: 10/02/14  Weight change: Filed Weights   10/01/14 0600 10/02/14 0500 10/03/14 0430  Weight: 72.4 kg (159 lb 9.8 oz) 74.3 kg (163 lb 12.8 oz) 74.5 kg (164 lb 3.9 oz)    Intake/Output:   Intake/Output Summary (Last 24 hours) at 10/03/14 1052 Last data filed at 10/03/14 0915  Gross per 24 hour  Intake 1917.8 ml  Output   1955 ml  Net  -37.2 ml     Physical Exam: General:  On trach  awake HEENT: normal Neck: + trach  Cor: Sternal dressing. Regular rate & rhythm. Lungs:decreased BS Abdomen: soft, nontender, nondistended. No hepatosplenomegaly. No bruits or masses. Good bowel sounds. Extremities: no cyanosis, clubbing, rash, 2+ edema into thighs Neuro: alert & orientedx3, cranial nerves grossly intact. moves all 4 extremities w/o difficulty. Affect pleasant  Telemetry: NSR  Labs: Basic Metabolic Panel:  Recent Labs Lab 09/28/14 0348  09/29/14 0424 09/29/14 1555 09/30/14 0431 10/01/14 0435 10/02/14 0500 10/03/14 0450  NA 133*  < > 137 137 137 139 141 140  K 3.6  < > 2.6* 3.7 3.1* 2.9* 3.7 3.4*  CL 96  < > 105 100 103 106 109 103  CO2 24  --  23  --  GLUCOSE 127*  < > 122* 127* 106* 148* 118* 130*  BUN 39*  < > 37* 40* 44* 45* 53* 57*  CREATININE  1.99*  < > 1.95* 2.10* 2.17* 1.95* 1.81* 1.92*  CALCIUM 8.0*  --  7.4*  --  8.5 8.3* 8.0* 8.2*  MG 1.7  --   --   --   --   --   --   --   PHOS 4.5  --   --   --   --   --   --   --   < > = values in this interval not displayed.  Liver Function Tests:  Recent Labs Lab 09/29/14 0424 09/30/14 0431 10/01/14 0435 10/02/14 0500 10/03/14 0450  AST 76* 81* 70* 68* 67*  ALT 49 57* 55 52 52  ALKPHOS 300* 323* 295* 281* 260*  BILITOT 2.7* 3.0* 2.7* 2.4* 2.3*  PROT 4.8* 5.5* 5.4* 5.3* 5.2*  ALBUMIN 2.4* 2.4* 2.3* 2.3* 2.2*   No results for input(s): LIPASE, AMYLASE in the last 168 hours. No results for input(s): AMMONIA in the last 168 hours.  CBC:  Recent Labs Lab 09/29/14 0424 09/29/14 1555 09/30/14 0431 10/01/14 0435 10/02/14 0500 10/03/14 0450  WBC 11.4*  --  11.3* 11.7* 12.9* 12.1*  NEUTROABS 10.1*  --  9.8* 10.2* 10.5* 9.9*  HGB 9.1* 11.6* 9.5* 9.8* 9.9* 9.5*  HCT 27.9*  34.0* 29.4* 30.3* 31.0* 30.7*  MCV 91.2  --  91.3 90.7 92.5 94.5  PLT 286  --  326 355 378 361    Cardiac Enzymes: No results for input(s): CKTOTAL, CKMB, CKMBINDEX, TROPONINI in the last 168 hours.  BNP: BNP (last 3 results)  Recent Labs  09/13/14 1310  BNP 96.8    ProBNP (last 3 results) No results for input(s): PROBNP in the last 8760 hours.    Other results:  Imaging: Dg Chest Port 1 View  10/03/2014   CLINICAL DATA:  Acute respiratory failure with hypoxia  EXAM: PORTABLE CHEST - 1 VIEW  COMPARISON:  10/02/2014.  FINDINGS: Unchanged support tubes and lines. Tracheostomy remains in good position. Mild vascular congestion without pneumothorax BILATERAL pleural effusions result in layering densities at the lung bases.  IMPRESSION: Stable chest.   Electronically Signed   By: Davonna Belling M.D.   On: 10/03/2014 07:08   Dg Chest Port 1 View  10/02/2014   CLINICAL DATA:  Status post CABG on September 13, 2014 as well as patent foramen ovale closure, cardiogenic shock, acute MI.  EXAM: PORTABLE CHEST  - 1 VIEW  COMPARISON:  Portable chest x-ray of September 30, 2014.  FINDINGS: The lungs are well-expanded. There is no alveolar infiltrate. There are bilateral pleural effusions layering posteriorly. The heart and pulmonary vascularity are normal. The tracheostomy appliance tip projects just below the inferior margin of the clavicular heads. The feeding tube tip projects below the inferior margin of the image. The PICC line tip projects over the junction of the middle and distal thirds of the SVC. There are 7 intact sternal wires.  IMPRESSION: Stable moderate-sized bilateral pleural effusions layering posteriorly. Minimal pulmonary interstitial edema, stable   Electronically Signed   By: David  [place] M.D.   On: 10/02/2014 07:30     Medications:     Scheduled Medications: . ALPRAZolam  0.25 mg Oral BID  . amiodarone  200 mg Oral BID  . antiseptic oral rinse  7 mL Mouth Rinse QID  . aspirin EC  325 mg Oral Daily   Or  . aspirin  324 mg Per Tube Daily  . atorvastatin  40 mg Oral q1800  . bisacodyl  10 mg Oral Daily   Or  . bisacodyl  10 mg Rectal Daily  . budesonide (PULMICORT) nebulizer solution  0.5 mg Nebulization BID  . cefTAZidime (FORTAZ)  IV  1 g Intravenous Q12H  . chlorhexidine  15 mL Mouth Rinse BID  . digoxin  0.125 mg Per Tube QODAY  . enoxaparin (LOVENOX) injection  30 mg Subcutaneous Daily  . feeding supplement (VITAL 1.5 CAL)  1,000 mL Per Tube Q24H  . insulin aspart  0-9 Units Subcutaneous 6 times per day  . insulin detemir  15 Units Subcutaneous BID  . lidocaine-prilocaine  1 application Topical Once  . metoCLOPramide (REGLAN) injection  10 mg Intravenous Q12H  . midodrine  10 mg Oral TID WC  . nicotine  14 mg Transdermal Daily  . oxymetazoline  2 spray Each Nare Once  . pantoprazole sodium  40 mg Per Tube Daily  . QUEtiapine  50 mg Oral QHS  . sodium chloride  10-40 mL Intracatheter Q12H  . spironolactone  12.5 mg Oral BID  . torsemide  20 mg Oral BID  . vancomycin   1,000 mg Intravenous Q48H    Infusions: . lactated ringers Stopped (09/30/14 2000)  . norepinephrine (LEVOPHED) Adult infusion 6 mcg/min (10/03/14 0700)  PRN Medications: [CANCELED] Place/Maintain arterial line **AND** sodium chloride, oxyCODONE **AND** acetaminophen, ipratropium-albuterol, levalbuterol, ondansetron (ZOFRAN) IV, sodium chloride, traMADol   Assessment:   1. Cardiogenic shock 2. Acute systolic HF EF 25-30% by TEE 4/15 3. Acute anterolateral STEMI    --Emergent CABG 4/13 4. Acute respiratory failure   --s/p trach 4/26 5. Thrombocytopenia 6. Acute renal failure, stage III 7. Hyponatremia/hyponkalemia 8. PAF with RVR 9. Hyperbilirubinemia  Plan/Discussion:    He is s/p trach.   Continues to improve slowly but still pressor dependent. Unable to get off levophed.  Continue midodrine - I am hesitant to go much higher than 30mg /day. He continues to 3rd space. Continue demadex and spiro for slow diuresis. Can increase slightly as needed. Will add dobutamine 2,5 to see if this will help us get of levophed. He could go to [facility] on dobutamine.   Maintaining NSR on amio. Ileus has improved.   LTAC consult placed.   Repeat echo pending  Length of Stay: 20 Arvilla MeresBensimhon, Genecis Veley MD  10/03/2014, 10:52 AM  Advanced Heart Failure Team Pager [phone] (M-F; 7a - 4p)  Please contact [facility] for night-coverage after hours (4p -7a ) and weekends on [email or web address]

## 2014-10-04 DIAGNOSIS — I9589 Other hypotension: Secondary | ICD-10-CM | POA: Insufficient documentation

## 2014-10-04 DIAGNOSIS — J90 Pleural effusion, not elsewhere classified: Secondary | ICD-10-CM

## 2014-10-04 DIAGNOSIS — J189 Pneumonia, unspecified organism: Secondary | ICD-10-CM

## 2014-10-04 LAB — GLUCOSE, CAPILLARY
GLUCOSE-CAPILLARY: 133 mg/dL — AB (ref 70–99)
GLUCOSE-CAPILLARY: 121 mg/dL — AB (ref 70–99)
Glucose-Capillary: 108 mg/dL — ABNORMAL HIGH (ref 70–99)
Glucose-Capillary: 114 mg/dL — ABNORMAL HIGH (ref 70–99)
Glucose-Capillary: 126 mg/dL — ABNORMAL HIGH (ref 70–99)
Glucose-Capillary: 130 mg/dL — ABNORMAL HIGH (ref 70–99)
Glucose-Capillary: 135 mg/dL — ABNORMAL HIGH (ref 70–99)

## 2014-10-04 LAB — COMPREHENSIVE METABOLIC PANEL
ALT: 50 U/L (ref 17–63)
AST: 58 U/L — ABNORMAL HIGH (ref 15–41)
Albumin: 2.1 g/dL — ABNORMAL LOW (ref 3.5–5.0)
Alkaline Phosphatase: 237 U/L — ABNORMAL HIGH (ref 38–126)
Anion gap: 10 (ref 5–15)
BUN: 60 mg/dL — ABNORMAL HIGH (ref 6–20)
CO2: 28 mmol/L (ref 22–32)
Calcium: 7.7 mg/dL — ABNORMAL LOW (ref 8.9–10.3)
Chloride: 99 mmol/L — ABNORMAL LOW (ref 101–111)
Creatinine, Ser: 1.83 mg/dL — ABNORMAL HIGH (ref 0.61–1.24)
GFR calc Af Amer: 41 mL/min — ABNORMAL LOW (ref >60)
GFR calc non Af Amer: 35 mL/min — ABNORMAL LOW (ref >60)
Glucose, Bld: 269 mg/dL — ABNORMAL HIGH (ref 70–99)
Potassium: 3.3 mmol/L — ABNORMAL LOW (ref 3.5–5.1)
Sodium: 137 mmol/L (ref 135–145)
Total Bilirubin: 2.1 mg/dL — ABNORMAL HIGH (ref 0.3–1.2)
Total Protein: 5.5 g/dL — ABNORMAL LOW (ref 6.5–8.1)

## 2014-10-04 LAB — CBC WITH DIFFERENTIAL/PLATELET
Basophils Absolute: 0.1 10*3/uL (ref 0.0–0.1)
Basophils Relative: 0 % (ref 0–1)
Eosinophils Absolute: 0.1 10*3/uL (ref 0.0–0.7)
Eosinophils Relative: 1 % (ref 0–5)
HCT: 29.8 % — ABNORMAL LOW (ref 39.0–52.0)
Hemoglobin: 9.2 g/dL — ABNORMAL LOW (ref 13.0–17.0)
Lymphocytes Relative: 4 % — ABNORMAL LOW (ref 12–46)
Lymphs Abs: 0.5 10*3/uL — ABNORMAL LOW (ref 0.7–4.0)
MCH: 29.8 pg (ref 26.0–34.0)
MCHC: 30.9 g/dL (ref 30.0–36.0)
MCV: 96.4 fL (ref 78.0–100.0)
Monocytes Absolute: 1.6 10*3/uL — ABNORMAL HIGH (ref 0.1–1.0)
Monocytes Relative: 12 % (ref 3–12)
Neutro Abs: 11.3 10*3/uL — ABNORMAL HIGH (ref 1.7–7.7)
Neutrophils Relative %: 83 % — ABNORMAL HIGH (ref 43–77)
Platelets: 352 10*3/uL (ref 150–400)
RBC: 3.09 MIL/uL — ABNORMAL LOW (ref 4.22–5.81)
RDW: 18.8 % — ABNORMAL HIGH (ref 11.5–15.5)
WBC: 13.5 10*3/uL — ABNORMAL HIGH (ref 4.0–10.5)

## 2014-10-04 LAB — PROTIME-INR
INR: 1.16 (ref 0.00–1.49)
Prothrombin Time: 15 seconds (ref 11.6–15.2)

## 2014-10-04 LAB — APTT: aPTT: 32 seconds (ref 24–37)

## 2014-10-04 LAB — CARBOXYHEMOGLOBIN
Carboxyhemoglobin: 2.1 % — ABNORMAL HIGH (ref 0.5–1.5)
Methemoglobin: 1 % (ref 0.0–1.5)
O2 Saturation: 81.6 %
Total hemoglobin: 8.8 g/dL — ABNORMAL LOW (ref 13.5–18.0)

## 2014-10-04 LAB — PROCALCITONIN: PROCALCITONIN: 0.15 ng/mL

## 2014-10-04 MED ORDER — VANCOMYCIN HCL IN DEXTROSE 750-5 MG/150ML-% IV SOLN: 750.0000 mg | Freq: Once | INTRAVENOUS | Status: DC

## 2014-10-04 MED ORDER — SODIUM CHLORIDE 0.9 % IV SOLN
INTRAVENOUS | Status: DC
  Administered 2014-10-04: 13:00:00 via INTRAVENOUS

## 2014-10-04 MED ORDER — POTASSIUM CHLORIDE 20 MEQ/15ML (10%) PO SOLN
40.0000 meq | Freq: Once | ORAL | Status: AC
  Administered 2014-10-04: 40 meq via ORAL
  Filled 2014-10-04: qty 30

## 2014-10-04 MED ORDER — ALBUMIN HUMAN 25 % IV SOLN
12.5000 g | Freq: Four times a day (QID) | INTRAVENOUS | Status: AC
  Administered 2014-10-04 (×2): 12.5 g via INTRAVENOUS
  Filled 2014-10-04 (×2): qty 50

## 2014-10-04 MED ORDER — VANCOMYCIN HCL IN DEXTROSE 750-5 MG/150ML-% IV SOLN
750.0000 mg | INTRAVENOUS | Status: AC
  Administered 2014-10-05: 750 mg via INTRAVENOUS
  Filled 2014-10-04: qty 150

## 2014-10-04 NOTE — Progress Notes (Signed)
PULMONARY / CRITICAL CARE MEDICINE   Name: Justin MattocksDennis W Laba MRN: [id number] DOB: [birth year]    ADMISSION DATE:  09/13/2014 CONSULTATION DATE:  10/04/2014  REFERRING MD :  Donata ClayVan Trigt  CHIEF COMPLAINT:  Epigastric pain  INITIAL PRESENTATION:   74 yo male smoker presented with epigastric pain from anterolateral MI STEMI >> found to have multi-vessel CAD >> To OR for emergent CABG, closure of PFO, and Impella placement.  PCCM consulted post-op for assistance with shock and vent management.  STUDIES:  4/13 cardiac cath 4/13 Echo >> EF 35% 5/3 Echo>> LVEF 50%, grade 1 DD, RV dilation, RA est pressure 8  SIGNIFICANT EVENTS: 4/13 CABG, closure of PFO, impella, VDRF, cardiogenic shock  4/14 Lost pacemaker capture >> Cardiac arrest >> 8 minutes before ROSC >> wide complex tachycardia >> cardioversion; Hb 5 >> transfused PRBC 4/18 A fib with RVR >> amiodarone added 4/19 Oxygen desaturation >> suctioned mucus plug; a fib with RVR >> cardioversion 4/20 Impella out; A fib with RVR >> cardioversion 4/26 trach placed, L chest tube 5/3 24 hours of pressure support 5/4 attempted ATC  SUBJECTIVE:  Weaned on pressure support for 24 hours, ATC this morning for 1.5 hours  VITAL SIGNS: Temp:  [97.9 F (36.6 C)-98.9 F (37.2 C)] 98.4 F (36.9 C) (05/04 0850) Pulse Rate:  [86-110] 106 (05/04 0826) Resp:  [14-34] 31 (05/04 0826) BP: (84-124)/(44-68) 109/57 mmHg (05/04 0826) SpO2:  [93 %-100 %] 93 % (05/04 0826) FiO2 (%):  [40 %] 40 % (05/04 0826) Weight:  [168 lb 6.9 oz (76.4 kg)] 168 lb 6.9 oz (76.4 kg) (05/04 0500) HEMODYNAMICS:   VENTILATOR SETTINGS: Vent Mode:  [-] CPAP;PSV FiO2 (%):  [40 %] 40 % PEEP:  [5 cmH20] 5 cmH20 Pressure Support:  [18 cmH20] 18 cmH20 INTAKE / OUTPUT: Intake/Output      05/03 0701 - 05/04 0700 05/04 0701 - 05/05 0700   I.V. (mL/kg) 245.2 (3.2)    Other     NG/GT 1580    IV Piggyback 100    Total Intake(mL/kg) 1925.2 (25.2)    Urine (mL/kg/hr) 2525 (1.4)    Stool     Total Output 2525     Net -599.8           PHYSICAL EXAMINATION:  Gen: chronically ill appearing, respiratory distress on ATC HENT: trach site c/d/i PULM: diminished bilaterally, no wheezing CV: Tachy, regular, no mgr GI: BS+, soft, nontender Derm: no cyanosis or rash MSK: swelling throughout Neuro: sleepy, arouses to voice, maew   LABS:  CBC  Recent Labs Lab 10/02/14 0500 10/03/14 0450 10/04/14 0500  WBC 12.9* 12.1* 13.5*  HGB 9.9* 9.5* 9.2*  HCT 31.0* 30.7* 29.8*  PLT 378 361 352     Coag's No results for input(s): APTT, INR in the last 168 hours.  BMET  Recent Labs Lab 10/02/14 0500 10/03/14 0450 10/04/14 0500  NA 141 140 137  K 3.7 3.4* 3.3*  CL 109 103 99*  CO2 24 27 28   BUN 53* 57* 60*  CREATININE 1.81* 1.92* 1.83*  GLUCOSE 118* 130* 269*     Electrolytes  Recent Labs Lab 09/28/14 0348  10/02/14 0500 10/03/14 0450 10/04/14 0500  CALCIUM 8.0*  < > 8.0* 8.2* 7.7*  MG 1.7  --   --   --   --   PHOS 4.5  --   --   --   --   < > = values in this interval not displayed.   Sepsis  Markers  Recent Labs Lab 10/02/14 1201 10/03/14 0450 10/04/14 0500  PROCALCITON 0.24 0.18 0.15   ABG  Recent Labs Lab 10/01/14 0421  PHART 7.395  PCO2ART 35.7  PO2ART 94.0   Liver Enzymes  Recent Labs Lab 10/02/14 0500 10/03/14 0450 10/04/14 0500  AST 68* 67* 58*  ALT 52 52 50  ALKPHOS 281* 260* 237*  BILITOT 2.4* 2.3* 2.1*  ALBUMIN 2.3* 2.2* 2.1*   Cardiac Enzymes No results for input(s): TROPONINI, PROBNP in the last 168 hours.   Glucose  Recent Labs Lab 10/03/14 1152 10/03/14 1705 10/03/14 1943 10/04/14 0026 10/04/14 0332 10/04/14 0849  GLUCAP 118* 123* 128* 130* 135* 126*   Imaging 5/3 CXR images reviewed> stable bilateral effusions, no clear infiltrate  ASSESSMENT / PLAN:  CARDIOVASCULAR A:  STEMI s/p emergent CABG and PFO closure 4/13 Chronic systolic CHF - repeat echo 5/4 50% (improved) Cardiogenic shock A  fib with RVR Hx of HTN, HLD P:  - Wean off Levophed - pressors, echo per CVTS, cardiology  PULMONARY ETT 4/13 >>4/26 Trach (CVTS) 4/26>>> L chest tube 4/26>>> A: Prolonged VDRF at this point due primarily to pulm edema and pleural effusions > little improvement Pleural effusion s unchanged  P:   - agree with bilateral pleural drains to assist with  - Back on PSV now, 15/5 goal 12-24  hours - Titrate O2 for sats > 92% - diuresis as Cr allows  RENAL A:   Hypokalemia AKI Cr stable, good UOP  P:   - Monitor BMET intermittently - Monitor I/Os - Correct electrolytes as indicated  GASTROINTESTINAL A:   Hx of GERD Hyperbilirubinemia > improving, multifactorial per GI P:   - Cont SUP - TF as tol   HEMATOLOGIC A:   Mild anemia without acute blood loss Thrombocytopenia, improving P:  - DVT px: LMWH - Monitor CBC intermittently - Transfuse per usual ICU guidelines  INFECTIOUS A:   Fever > resolved HCAP> resolved P:   - Ceftaz 4/20>>> 5/4 - Vanc 4/26>>> 4/28 - Abx per TCTS - Will stop antibiotics, monitor for fever  ENDOCRINE A:   Hyperglycemia P:   - Cont SSI   NEUROLOGIC A:   Acute encephalopathy, improved 5/4 Hx of Anxiety / Depression P:   - RASS goal 0 - PRN morphine  - Xanax per TCTS, would wean off as able  Heber CarolinaBrent McQuaid, MD [place] [facility] Pager: [phone] Cell: [phone] If no response, call [phone]

## 2014-10-04 NOTE — Progress Notes (Signed)
8 Days Post-Op Procedure(s) (LRB): TRACHEOSTOMY (N/A) CHEST TUBE INSERTION (Left) Subjective: Currently on trach collar, responsive Maintaining sinus rhythm Low-dose dobutamine, norepinephrine with stable blood pressure and excellent: Mixed venous saturation Chest x-ray with bilateral pleural effusions, creatinine remains elevated so will place bilateral Pleurx catheters tomorrow for optimization of pulmonary status. Procedure discussed with patient  Objective: Vital signs in last 24 hours: Temp:  [97.9 F (36.6 C)-98.9 F (37.2 C)] 98.4 F (36.9 C) (05/04 0850) Pulse Rate:  [86-110] 101 (05/04 0936) Cardiac Rhythm:  [-] Normal sinus rhythm (05/03 2000) Resp:  [14-34] 34 (05/04 0936) BP: (84-124)/(44-68) 103/54 mmHg (05/04 0900) SpO2:  [93 %-100 %] 93 % (05/04 0936) FiO2 (%):  [40 %] 40 % (05/04 0936) Weight:  [168 lb 6.9 oz (76.4 kg)] 168 lb 6.9 oz (76.4 kg) (05/04 0500)  Hemodynamic parameters for last 24 hours:   stable Intake/Output from previous day: 05/03 0701 - 05/04 0700 In: 1925.2 [I.V.:245.2; NG/GT:1580; IV Piggyback:100] Out: 2525 [Urine:2525] Intake/Output this shift:    Neuro intact Diminished breath sounds bilaterally Sternotomy healing well  Lab Results:  Recent Labs  10/03/14 0450 10/04/14 0500  WBC 12.1* 13.5*  HGB 9.5* 9.2*  HCT 30.7* 29.8*  PLT 361 352   BMET:  Recent Labs  10/03/14 0450 10/04/14 0500  NA 140 137  K 3.4* 3.3*  CL 103 99*  CO2 27 28  GLUCOSE 130* 269*  BUN 57* 60*  CREATININE 1.92* 1.83*  CALCIUM 8.2* 7.7*    PT/INR: No results for input(s): LABPROT, INR in the last 72 hours. ABG    Component Value Date/Time   PHART 7.395 10/01/2014 0421   HCO3 21.8 10/01/2014 0421   TCO2 23 10/01/2014 0421   ACIDBASEDEF 3.0* 10/01/2014 0421   O2SAT 81.6 10/04/2014 0407   CBG (last 3)   Recent Labs  10/04/14 0026 10/04/14 0332 10/04/14 0849  GLUCAP 130* 135* 126*    Assessment/Plan: S/P Procedure(s)  (LRB): TRACHEOSTOMY (N/A) CHEST TUBE INSERTION (Left) Will place n.p.o. After midnight and plan bilateral Pleurx catheter placement in the or tomorrow under conscious sedation and local anesthesia. Check coags   LOS: 21 days    Kathlee Nationseter Van Trigt III 10/04/2014

## 2014-10-04 NOTE — Progress Notes (Signed)
Advanced Heart Failure Rounding Note   Subjective:     Presented with STEMI. Went for emergent CABG 4/14. Impella removed 4/20. Swan out 4/24. Underwent trach and left chest tube 4/26  On trach.  Lying in bed watching TV. No complaints. Dobutamine started yesterday with hopes of weaning levophed but levophed now up to 20. Echo 5/3 with EF 50%  Dr. Donata Clay planning on bilateral Pleurex tubes.   Maintaining SR on amio    Objective:   Weight Range:  Vital Signs:   Temp:  [97.9 F (36.6 C)-98.9 F (37.2 C)] 98.4 F (36.9 C) (05/04 0850) Pulse Rate:  [86-110] 101 (05/04 0936) Resp:  [14-34] 34 (05/04 0936) BP: (84-124)/(44-68) 103/54 mmHg (05/04 0900) SpO2:  [93 %-100 %] 93 % (05/04 0936) FiO2 (%):  [40 %] 40 % (05/04 0936) Weight:  [76.4 kg (168 lb 6.9 oz)] 76.4 kg (168 lb 6.9 oz) (05/04 0500) Last BM Date: 10/02/14  Weight change: Filed Weights   10/02/14 0500 10/03/14 0430 10/04/14 0500  Weight: 74.3 kg (163 lb 12.8 oz) 74.5 kg (164 lb 3.9 oz) 76.4 kg (168 lb 6.9 oz)    Intake/Output:   Intake/Output Summary (Last 24 hours) at 10/04/14 1046 Last data filed at 10/04/14 0600  Gross per 24 hour  Intake 1348.4 ml  Output   2175 ml  Net -826.6 ml     Physical Exam: General:  On trach  awake HEENT: normal Neck: + trach  Cor: Sternal dressing. Regular rate & rhythm. Lungs:decreased BS Abdomen: soft, nontender, nondistended. No hepatosplenomegaly. No bruits or masses. Good bowel sounds. Extremities: no cyanosis, clubbing, rash, 1+ edema into thighs Neuro: alert & orientedx3, cranial nerves grossly intact. moves all 4 extremities w/o difficulty. Affect pleasant  Telemetry: NSR  Labs: Basic Metabolic Panel:  Recent Labs Lab 09/28/14 0348  09/30/14 0431 10/01/14 0435 10/02/14 0500 10/03/14 0450 10/04/14 0500  NA 133*  < > 137 139 141 140 137  K 3.6  < > 3.1* 2.9* 3.7 3.4* 3.3*  CL 96  < > 103 106 109 103 99*  CO2 24  < > GLUCOSE 127*  < >  106* 148* 118* 130* 269*  BUN 39*  < > 44* 45* 53* 57* 60*  CREATININE 1.99*  < > 2.17* 1.95* 1.81* 1.92* 1.83*  CALCIUM 8.0*  < > 8.5 8.3* 8.0* 8.2* 7.7*  MG 1.7  --   --   --   --   --   --   PHOS 4.5  --   --   --   --   --   --   < > = values in this interval not displayed.  Liver Function Tests:  Recent Labs Lab 09/30/14 0431 10/01/14 0435 10/02/14 0500 10/03/14 0450 10/04/14 0500  AST 81* 70* 68* 67* 58*  ALT 57* 55 52 52 50  ALKPHOS 323* 295* 281* 260* 237*  BILITOT 3.0* 2.7* 2.4* 2.3* 2.1*  PROT 5.5* 5.4* 5.3* 5.2* 5.5*  ALBUMIN 2.4* 2.3* 2.3* 2.2* 2.1*   No results for input(s): LIPASE, AMYLASE in the last 168 hours. No results for input(s): AMMONIA in the last 168 hours.  CBC:  Recent Labs Lab 09/30/14 0431 10/01/14 0435 10/02/14 0500 10/03/14 0450 10/04/14 0500  WBC 11.3* 11.7* 12.9* 12.1* 13.5*  NEUTROABS 9.8* 10.2* 10.5* 9.9* 11.3*  HGB 9.5* 9.8* 9.9* 9.5* 9.2*  HCT 29.4* 30.3* 31.0* 30.7* 29.8*  MCV 91.3 90.7 92.5 94.5 96.4  PLT 326 355 378 361 352    Cardiac Enzymes: No results for input(s): CKTOTAL, CKMB, CKMBINDEX, TROPONINI in the last 168 hours.  BNP: BNP (last 3 results)  Recent Labs  09/13/14 1310  BNP 96.8    ProBNP (last 3 results) No results for input(s): PROBNP in the last 8760 hours.    Other results:  Imaging: Dg Chest Port 1 View  10/03/2014   CLINICAL DATA:  Acute respiratory failure with hypoxia  EXAM: PORTABLE CHEST - 1 VIEW  COMPARISON:  10/02/2014.  FINDINGS: Unchanged support tubes and lines. Tracheostomy remains in good position. Mild vascular congestion without pneumothorax BILATERAL pleural effusions result in layering densities at the lung bases.  IMPRESSION: Stable chest.   Electronically Signed   By: Davonna BellingJohn  Curnes M.D.   On: 10/03/2014 07:08     Medications:     Scheduled Medications: . albumin human  12.5 g Intravenous Q6H  . ALPRAZolam  0.5 mg Oral TID  . amiodarone  200 mg Oral BID  . antiseptic oral  rinse  7 mL Mouth Rinse QID  . aspirin EC  325 mg Oral Daily   Or  . aspirin  324 mg Per Tube Daily  . atorvastatin  40 mg Oral q1800  . bisacodyl  10 mg Oral Daily   Or  . bisacodyl  10 mg Rectal Daily  . budesonide (PULMICORT) nebulizer solution  0.5 mg Nebulization BID  . chlorhexidine  15 mL Mouth Rinse BID  . digoxin  0.125 mg Per Tube QODAY  . enoxaparin (LOVENOX) injection  30 mg Subcutaneous Daily  . feeding supplement (PRO-STAT SUGAR FREE 64)  30 mL Per Tube BID  . feeding supplement (VITAL 1.5 CAL)  1,000 mL Per Tube Q24H  . insulin aspart  0-9 Units Subcutaneous 6 times per day  . insulin detemir  15 Units Subcutaneous BID  . lidocaine-prilocaine  1 application Topical Once  . metoCLOPramide (REGLAN) injection  10 mg Intravenous Q12H  . midodrine  10 mg Oral TID WC  . nicotine  14 mg Transdermal Daily  . oxymetazoline  2 spray Each Nare Once  . pantoprazole sodium  40 mg Per Tube Daily  . QUEtiapine  50 mg Oral QHS  . sodium chloride  10-40 mL Intracatheter Q12H  . spironolactone  12.5 mg Oral BID  . torsemide  20 mg Oral BID    Infusions: . DOBUTamine 2.5 mcg/kg/min (10/04/14 0600)  . lactated ringers Stopped (09/30/14 2000)  . norepinephrine (LEVOPHED) Adult infusion 10.027 mcg/min (10/04/14 0600)    PRN Medications: [CANCELED] Place/Maintain arterial line **AND** sodium chloride, oxyCODONE **AND** acetaminophen, ipratropium-albuterol, levalbuterol, ondansetron (ZOFRAN) IV, sodium chloride, traMADol   Assessment:   1. Cardiogenic shock 2. Acute systolic HF EF 25-30% by TEE 4/15 3. Acute anterolateral STEMI    --Emergent CABG 4/13 4. Acute respiratory failure   --s/p trach 4/26 5. Thrombocytopenia 6. Acute renal failure, stage III 7. Hyponatremia/hyponkalemia 8. PAF with RVR 9. Hyperbilirubinemia  Plan/Discussion:    He is s/p trach.   Continues to improve slowly but still pressor dependent. EF 50% however he has been unable to get off levophed.  Dobutamine added but resulted in increasing levophed needs. Will stop dobutamine. Continue midodrine - I am hesitant to go much higher than 30mg /day. Will continue low-dose demadex.   Maintaining NSR on amio. Ileus has improved.   LTAC consult placed.    Length of Stay: 21 Arvilla MeresBensimhon, Candelaria Pies MD  10/04/2014, 10:46 AM  Advanced Heart Failure  Team Pager [phone] (M-F; 7a - 4p)  Please contact [facility] for night-coverage after hours (4p -7a ) and weekends on [email or web address]

## 2014-10-04 NOTE — Progress Notes (Signed)
SLP Cancellation Note  Patient Details Name: Justin MattocksDennis W Pettus MRN: [id number] DOB: Oct 17, 1940   Cancelled treatment:   Unable to see pt today for inline PMV due to scheduling conflicts.  Pt to OR tomorrow; will check in after procedure to determine appropriateness for treatment.  Orders have been received for swallow assessment; SLP will follow for readiness - pt will need FEES given presence of trach and degree of deconditioning. D/W RN.  Will continue to follow.         Blenda MountsCouture, Crispin Vogel Laurice 10/04/2014, 2:13 PM

## 2014-10-04 NOTE — Progress Notes (Signed)
      [address]       [place] [id number]             [phone]      Resting on ventilator  BP 116/60 mmHg  Pulse 90  Temp(Src) 98.6 F (37 C) (Oral)  Resp 17  Ht 5\' 7"  (1.702 m)  Wt 168 lb 6.9 oz (76.4 kg)  BMI 26.37 kg/m2  SpO2 100%   Intake/Output Summary (Last 24 hours) at 10/04/14 1919 Last data filed at 10/04/14 1900  Gross per 24 hour  Intake 1775.8 ml  Output   2675 ml  Net -899.2 ml    For bilateral pleural catheters in AM  Wilmina Maxham C. Dorris FetchHendrickson, MD [facility] [phone]

## 2014-10-05 ENCOUNTER — Other Ambulatory Visit (HOSPITAL_COMMUNITY): Payer: Self-pay

## 2014-10-05 ENCOUNTER — Encounter (HOSPITAL_COMMUNITY)
Admission: EM | Disposition: A | Discharge status: Nursing Home (Includes ICF) | Payer: Self-pay | Source: Home / Self Care | Attending: Cardiothoracic Surgery

## 2014-10-05 ENCOUNTER — Inpatient Hospital Stay (HOSPITAL_COMMUNITY): Payer: Medicare Other | Admitting: Certified Registered"

## 2014-10-05 ENCOUNTER — Inpatient Hospital Stay
Admission: AD | Admit: 2014-10-05 | Discharge: 2014-12-06 | Disposition: A | Discharge status: Skilled Nursing Facility | Payer: Self-pay | Source: Ambulatory Visit | Attending: Internal Medicine | Admitting: Internal Medicine

## 2014-10-05 ENCOUNTER — Inpatient Hospital Stay (HOSPITAL_COMMUNITY): Payer: Medicare Other

## 2014-10-05 ENCOUNTER — Encounter (HOSPITAL_COMMUNITY): Payer: Self-pay | Admitting: Certified Registered"

## 2014-10-05 DIAGNOSIS — R5381 Other malaise: Secondary | ICD-10-CM

## 2014-10-05 DIAGNOSIS — J90 Pleural effusion, not elsewhere classified: Secondary | ICD-10-CM | POA: Insufficient documentation

## 2014-10-05 DIAGNOSIS — J9620 Acute and chronic respiratory failure, unspecified whether with hypoxia or hypercapnia: Secondary | ICD-10-CM

## 2014-10-05 DIAGNOSIS — I509 Heart failure, unspecified: Secondary | ICD-10-CM | POA: Insufficient documentation

## 2014-10-05 DIAGNOSIS — Z931 Gastrostomy status: Secondary | ICD-10-CM | POA: Insufficient documentation

## 2014-10-05 DIAGNOSIS — J948 Other specified pleural conditions: Secondary | ICD-10-CM

## 2014-10-05 DIAGNOSIS — Z431 Encounter for attention to gastrostomy: Secondary | ICD-10-CM | POA: Insufficient documentation

## 2014-10-05 DIAGNOSIS — J9600 Acute respiratory failure, unspecified whether with hypoxia or hypercapnia: Secondary | ICD-10-CM

## 2014-10-05 DIAGNOSIS — Z4803 Encounter for change or removal of drains: Secondary | ICD-10-CM

## 2014-10-05 DIAGNOSIS — E46 Unspecified protein-calorie malnutrition: Secondary | ICD-10-CM | POA: Insufficient documentation

## 2014-10-05 DIAGNOSIS — J189 Pneumonia, unspecified organism: Secondary | ICD-10-CM

## 2014-10-05 DIAGNOSIS — Z4659 Encounter for fitting and adjustment of other gastrointestinal appliance and device: Secondary | ICD-10-CM | POA: Insufficient documentation

## 2014-10-05 DIAGNOSIS — J9690 Respiratory failure, unspecified, unspecified whether with hypoxia or hypercapnia: Secondary | ICD-10-CM | POA: Insufficient documentation

## 2014-10-05 DIAGNOSIS — R579 Shock, unspecified: Secondary | ICD-10-CM

## 2014-10-05 DIAGNOSIS — R069 Unspecified abnormalities of breathing: Secondary | ICD-10-CM

## 2014-10-05 DIAGNOSIS — I5021 Acute systolic (congestive) heart failure: Secondary | ICD-10-CM

## 2014-10-05 HISTORY — PX: CHEST TUBE INSERTION: SHX231

## 2014-10-05 LAB — GLUCOSE, CAPILLARY
Glucose-Capillary: 98 mg/dL (ref 70–99)
Glucose-Capillary: 154 mg/dL — ABNORMAL HIGH (ref 70–99)
Glucose-Capillary: 48 mg/dL — ABNORMAL LOW (ref 70–99)

## 2014-10-05 LAB — CBC WITH DIFFERENTIAL/PLATELET
Basophils Absolute: 0.1 10*3/uL (ref 0.0–0.1)
Basophils Relative: 1 % (ref 0–1)
Eosinophils Absolute: 0.1 10*3/uL (ref 0.0–0.7)
Eosinophils Relative: 1 % (ref 0–5)
HCT: 27.7 % — ABNORMAL LOW (ref 39.0–52.0)
Hemoglobin: 8.6 g/dL — ABNORMAL LOW (ref 13.0–17.0)
Lymphocytes Relative: 4 % — ABNORMAL LOW (ref 12–46)
Lymphs Abs: 0.6 10*3/uL — ABNORMAL LOW (ref 0.7–4.0)
MCH: 29.8 pg (ref 26.0–34.0)
MCHC: 31 g/dL (ref 30.0–36.0)
MCV: 95.8 fL (ref 78.0–100.0)
Monocytes Absolute: 1.4 10*3/uL — ABNORMAL HIGH (ref 0.1–1.0)
Monocytes Relative: 10 % (ref 3–12)
Neutro Abs: 11.8 10*3/uL — ABNORMAL HIGH (ref 1.7–7.7)
Neutrophils Relative %: 84 % — ABNORMAL HIGH (ref 43–77)
Platelets: 344 10*3/uL (ref 150–400)
RBC: 2.89 MIL/uL — ABNORMAL LOW (ref 4.22–5.81)
RDW: 18.6 % — ABNORMAL HIGH (ref 11.5–15.5)
WBC: 14.1 10*3/uL — ABNORMAL HIGH (ref 4.0–10.5)

## 2014-10-05 LAB — BASIC METABOLIC PANEL
ANION GAP: 9 (ref 5–15)
BUN: 67 mg/dL — ABNORMAL HIGH (ref 6–20)
CO2: 31 mmol/L (ref 22–32)
CREATININE: 1.87 mg/dL — AB (ref 0.61–1.24)
Calcium: 8.2 mg/dL — ABNORMAL LOW (ref 8.9–10.3)
Chloride: 104 mmol/L (ref 101–111)
GFR calc Af Amer: 40 mL/min — ABNORMAL LOW (ref >60)
GFR calc non Af Amer: 35 mL/min — ABNORMAL LOW (ref >60)
Glucose, Bld: 156 mg/dL — ABNORMAL HIGH (ref 70–99)
Potassium: 3.9 mmol/L (ref 3.5–5.1)
SODIUM: 144 mmol/L (ref 135–145)

## 2014-10-05 SURGERY — INSERTION, PLEURAL DRAINAGE CATHETER
Anesthesia: Monitor Anesthesia Care | Site: Chest | Laterality: Bilateral

## 2014-10-05 MED ORDER — CEFAZOLIN SODIUM-DEXTROSE 2-3 GM-% IV SOLR
INTRAVENOUS | Status: DC | PRN
  Administered 2014-10-05: 2 g via INTRAVENOUS

## 2014-10-05 MED ORDER — LIDOCAINE HCL (CARDIAC) 20 MG/ML IV SOLN
INTRAVENOUS | Status: AC
  Filled 2014-10-05: qty 5

## 2014-10-05 MED ORDER — MIDAZOLAM HCL 5 MG/5ML IJ SOLN
INTRAMUSCULAR | Status: DC | PRN
  Administered 2014-10-05: 1 mg via INTRAVENOUS

## 2014-10-05 MED ORDER — MIDAZOLAM HCL 2 MG/2ML IJ SOLN
INTRAMUSCULAR | Status: AC
  Filled 2014-10-05: qty 2

## 2014-10-05 MED ORDER — IPRATROPIUM-ALBUTEROL 0.5-2.5 (3) MG/3ML IN SOLN: 3.0000 mL | RESPIRATORY_TRACT | Status: AC | PRN

## 2014-10-05 MED ORDER — PRO-STAT SUGAR FREE PO LIQD: 30.0000 mL | Freq: Two times a day (BID) | ORAL | Status: AC

## 2014-10-05 MED ORDER — ATORVASTATIN CALCIUM 40 MG PO TABS: 40.0000 mg | ORAL_TABLET | Freq: Every day | ORAL | Status: AC

## 2014-10-05 MED ORDER — VITAL 1.5 CAL PO LIQD: 1000.0000 mL | ORAL | Status: AC

## 2014-10-05 MED ORDER — PROMETHAZINE HCL 25 MG/ML IJ SOLN
6.2500 mg | INTRAMUSCULAR | Status: DC | PRN
Start: 2014-10-05 — End: 2014-10-05

## 2014-10-05 MED ORDER — ACETAMINOPHEN 160 MG/5ML PO SOLN: 325.0000 mg | ORAL | Status: AC | PRN

## 2014-10-05 MED ORDER — SUCCINYLCHOLINE CHLORIDE 20 MG/ML IJ SOLN
INTRAMUSCULAR | Status: AC
  Filled 2014-10-05: qty 1

## 2014-10-05 MED ORDER — LIDOCAINE HCL (PF) 1 % IJ SOLN
INTRAMUSCULAR | Status: AC
Start: 2014-10-05 — End: 2014-10-05
  Filled 2014-10-05: qty 30

## 2014-10-05 MED ORDER — VANCOMYCIN HCL 1000 MG IV SOLR
750.0000 mg | INTRAVENOUS | Status: DC
  Filled 2014-10-05: qty 750

## 2014-10-05 MED ORDER — ONDANSETRON HCL 4 MG/2ML IJ SOLN: 4.0000 mg | Freq: Four times a day (QID) | INTRAMUSCULAR | Status: AC | PRN

## 2014-10-05 MED ORDER — ALPRAZOLAM 0.5 MG PO TABS: 0.5000 mg | ORAL_TABLET | Freq: Three times a day (TID) | ORAL | Status: AC

## 2014-10-05 MED ORDER — FENTANYL CITRATE (PF) 100 MCG/2ML IJ SOLN
INTRAMUSCULAR | Status: DC | PRN
  Administered 2014-10-05: 50 ug via INTRAVENOUS

## 2014-10-05 MED ORDER — PROPOFOL 10 MG/ML IV BOLUS
INTRAVENOUS | Status: AC
  Filled 2014-10-05: qty 20

## 2014-10-05 MED ORDER — CHLORHEXIDINE GLUCONATE 0.12 % MT SOLN: 15.0000 mL | Freq: Two times a day (BID) | OROMUCOSAL | Status: AC

## 2014-10-05 MED ORDER — SODIUM CHLORIDE 0.9 % IV SOLN
INTRAVENOUS | Status: DC | PRN
  Administered 2014-10-05: 08:00:00 via INTRAVENOUS

## 2014-10-05 MED ORDER — ASPIRIN 81 MG PO CHEW
324.0000 mg | CHEWABLE_TABLET | Freq: Every day | ORAL | Status: AC
Start: 2014-10-05 — End: ?

## 2014-10-05 MED ORDER — LIDOCAINE HCL 1 % IJ SOLN
INTRAMUSCULAR | Status: DC | PRN
  Administered 2014-10-05: 30 mL

## 2014-10-05 MED ORDER — TORSEMIDE 20 MG PO TABS: 20.0000 mg | ORAL_TABLET | Freq: Two times a day (BID) | ORAL | Status: AC

## 2014-10-05 MED ORDER — PANTOPRAZOLE SODIUM 40 MG PO PACK: 40.0000 mg | PACK | Freq: Every day | ORAL | Status: AC

## 2014-10-05 MED ORDER — NOREPINEPHRINE BITARTRATE 1 MG/ML IV SOLN: 0.0000 ug/min | INTRAVENOUS | Status: AC

## 2014-10-05 MED ORDER — QUETIAPINE FUMARATE 50 MG PO TABS: 50.0000 mg | ORAL_TABLET | Freq: Every day | ORAL | Status: AC

## 2014-10-05 MED ORDER — MEPERIDINE HCL 25 MG/ML IJ SOLN: 6.2500 mg | INTRAMUSCULAR | Status: DC | PRN

## 2014-10-05 MED ORDER — ROCURONIUM BROMIDE 50 MG/5ML IV SOLN
INTRAVENOUS | Status: AC
  Filled 2014-10-05: qty 1

## 2014-10-05 MED ORDER — BUDESONIDE 0.5 MG/2ML IN SUSP: 0.5000 mg | Freq: Two times a day (BID) | RESPIRATORY_TRACT | Status: AC

## 2014-10-05 MED ORDER — CEFAZOLIN SODIUM-DEXTROSE 2-3 GM-% IV SOLR
INTRAVENOUS | Status: AC
  Filled 2014-10-05: qty 50

## 2014-10-05 MED ORDER — FENTANYL CITRATE (PF) 250 MCG/5ML IJ SOLN
INTRAMUSCULAR | Status: AC
  Filled 2014-10-05: qty 5

## 2014-10-05 MED ORDER — FENTANYL CITRATE (PF) 100 MCG/2ML IJ SOLN: 25.0000 ug | INTRAMUSCULAR | Status: DC | PRN

## 2014-10-05 MED ORDER — ALBUMIN HUMAN 25 % IV SOLN
12.5000 g | Freq: Once | INTRAVENOUS | Status: DC
  Filled 2014-10-05: qty 50

## 2014-10-05 MED ORDER — AMIODARONE HCL 200 MG PO TABS
200.0000 mg | ORAL_TABLET | Freq: Two times a day (BID) | ORAL | Status: AC
Start: 2014-10-05 — End: ?

## 2014-10-05 MED ORDER — ENOXAPARIN SODIUM 30 MG/0.3ML ~~LOC~~ SOLN: 30.0000 mg | Freq: Every day | SUBCUTANEOUS | Status: DC

## 2014-10-05 MED ORDER — ENOXAPARIN SODIUM 30 MG/0.3ML ~~LOC~~ SOLN: 30.0000 mg | Freq: Every day | SUBCUTANEOUS | Status: AC

## 2014-10-05 MED ORDER — INSULIN DETEMIR 100 UNIT/ML ~~LOC~~ SOLN: 15.0000 [IU] | Freq: Two times a day (BID) | SUBCUTANEOUS | Status: AC

## 2014-10-05 MED ORDER — CETYLPYRIDINIUM CHLORIDE 0.05 % MT LIQD: 7.0000 mL | Freq: Four times a day (QID) | OROMUCOSAL | Status: AC

## 2014-10-05 MED ORDER — MIDODRINE HCL 10 MG PO TABS: 10.0000 mg | ORAL_TABLET | Freq: Three times a day (TID) | ORAL | Status: AC

## 2014-10-05 MED ORDER — METOCLOPRAMIDE HCL 5 MG/ML IJ SOLN: 10.0000 mg | Freq: Two times a day (BID) | INTRAMUSCULAR | Status: AC

## 2014-10-05 MED ORDER — DIGOXIN 125 MCG PO TABS: 0.1250 mg | ORAL_TABLET | ORAL | Status: AC

## 2014-10-05 MED ORDER — LEVALBUTEROL HCL 0.63 MG/3ML IN NEBU: 0.6300 mg | INHALATION_SOLUTION | RESPIRATORY_TRACT | Status: AC | PRN

## 2014-10-05 MED ORDER — NICOTINE 14 MG/24HR TD PT24: 14.0000 mg | MEDICATED_PATCH | Freq: Every day | TRANSDERMAL | Status: AC

## 2014-10-05 SURGICAL SUPPLY — 30 items
CANISTER SUCTION 2500CC (MISCELLANEOUS) ×3 IMPLANT
COVER SURGICAL LIGHT HANDLE (MISCELLANEOUS) ×3 IMPLANT
DERMABOND ADVANCED (GAUZE/BANDAGES/DRESSINGS) ×2
DERMABOND ADVANCED .7 DNX12 (GAUZE/BANDAGES/DRESSINGS) ×1 IMPLANT
DRAPE C-ARM 42X72 X-RAY (DRAPES) ×3 IMPLANT
DRAPE LAPAROSCOPIC ABDOMINAL (DRAPES) ×3 IMPLANT
GLOVE BIO SURGEON STRL SZ7.5 (GLOVE) ×3 IMPLANT
GLOVE BIOGEL PI IND STRL 6.5 (GLOVE) ×1 IMPLANT
GLOVE BIOGEL PI INDICATOR 6.5 (GLOVE) ×2
GLOVE SURG SS PI 6.5 STRL IVOR (GLOVE) ×3 IMPLANT
GOWN STRL REUS W/ TWL LRG LVL3 (GOWN DISPOSABLE) ×2 IMPLANT
GOWN STRL REUS W/TWL LRG LVL3 (GOWN DISPOSABLE) ×4
KIT BASIN OR (CUSTOM PROCEDURE TRAY) ×3 IMPLANT
KIT PLEURX DRAIN CATH 1000ML (MISCELLANEOUS) ×6 IMPLANT
KIT PLEURX DRAIN CATH 15.5FR (DRAIN) ×6 IMPLANT
KIT ROOM TURNOVER OR (KITS) ×3 IMPLANT
NEEDLE HYPO 25GX1X1/2 BEV (NEEDLE) ×3 IMPLANT
NS IRRIG 1000ML POUR BTL (IV SOLUTION) ×3 IMPLANT
PACK GENERAL/GYN (CUSTOM PROCEDURE TRAY) ×3 IMPLANT
PAD ARMBOARD 7.5X6 YLW CONV (MISCELLANEOUS) ×6 IMPLANT
SET DRAINAGE LINE (MISCELLANEOUS) IMPLANT
SUT ETHILON 3 0 PS 1 (SUTURE) ×9 IMPLANT
SUT SILK 2 0 SH (SUTURE) ×6 IMPLANT
SUT VIC AB 3-0 SH 8-18 (SUTURE) ×3 IMPLANT
SWAB COLLECTION DEVICE MRSA (MISCELLANEOUS) ×6 IMPLANT
SYR CONTROL 10ML LL (SYRINGE) ×3 IMPLANT
TOWEL OR 17X24 6PK STRL BLUE (TOWEL DISPOSABLE) ×3 IMPLANT
TOWEL OR 17X26 10 PK STRL BLUE (TOWEL DISPOSABLE) ×3 IMPLANT
VALVE REPLACEMENT CAP (MISCELLANEOUS) IMPLANT
WATER STERILE IRR 1000ML POUR (IV SOLUTION) ×3 IMPLANT

## 2014-10-05 NOTE — Anesthesia Postprocedure Evaluation (Signed)
Anesthesia Post Note  Patient: Justin MattocksDennis W Heide  Procedure(s) Performed: Procedure(s) (LRB): INSERTION PLEURAL DRAINAGE CATHETER (Bilateral)  Anesthesia type: MAC  Patient location: SICU  Post pain: Pain level controlled  Post assessment: Post-op Vital signs reviewed  Last Vitals: BP 116/55 mmHg  Pulse 102  Temp(Src) 37.6 C (Oral)  Resp 17  Ht 5\' 7"  (1.702 m)  Wt 156 lb 15.5 oz (71.2 kg)  BMI 24.58 kg/m2  SpO2 100%  Post vital signs: Reviewed  Level of consciousness: awake  Complications: No apparent anesthesia complications

## 2014-10-05 NOTE — Progress Notes (Signed)
The patient was examined and preop studies reviewed. There has been no change from the prior exam and the patient is ready for surgery.  Plan bilateral pleurx catheter placement on D Turski

## 2014-10-05 NOTE — Addendum Note (Signed)
Addendum  created 10/05/14 1054 by Shireen Quanavid R Mohamadou Maciver, CRNA   Modules edited: Anesthesia Blocks and Procedures, Clinical Notes   Clinical Notes:  File: [id number]

## 2014-10-05 NOTE — Progress Notes (Signed)
Advanced Heart Failure Rounding Note   Subjective:     Presented with STEMI. Went for emergent CABG 4/14. Impella removed 4/20. Swan out 4/24. Underwent trach and left chest tube 4/26  Dr. Donata ClayVan Trigt placed bilateral Pleurex tubes this am.   On trach.  Lying in bed.No complaints. Remains on levophed 14. Echo 5/3 with EF 50% Maintaining SR on amio    Objective:   Weight Range:  Vital Signs:   Temp:  [98.3 F (36.8 C)-99.7 F (37.6 C)] 98.3 F (36.8 C) (05/05 1100) Pulse Rate:  [88-107] 100 (05/05 1400) Resp:  [14-35] 27 (05/05 1400) BP: (92-120)/(40-72) 108/50 mmHg (05/05 1400) SpO2:  [98 %-100 %] 98 % (05/05 1400) FiO2 (%):  [40 %] 40 % (05/05 1205) Weight:  [156 lb 15.5 oz (71.2 kg)] 156 lb 15.5 oz (71.2 kg) (05/05 0500)    Weight change: There were no vitals filed for this visit.  Intake/Output:  No intake or output data in the 24 hours ending 10/05/14 1735   Physical Exam: General:  On trach  awake HEENT: normal Neck: + trach  Cor: Sternal dressing. Regular rate & rhythm. Lungs:decreased BS Abdomen: soft, nontender, nondistended. No hepatosplenomegaly. No bruits or masses. Good bowel sounds. Extremities: no cyanosis, clubbing, rash, tr-1+ edema into thighs Neuro: alert & orientedx3, cranial nerves grossly intact. moves all 4 extremities w/o difficulty. Affect pleasant  Telemetry: NSR  Labs: Basic Metabolic Panel:  Recent Labs Lab 10/01/14 0435 10/02/14 0500 10/03/14 0450 10/04/14 0500 10/05/14 1306  NA 139 141 140 137 144  K 2.9* 3.7 3.4* 3.3* 3.9  CL 106 109 103 99* 104  CO2 23 24 27 28 31   GLUCOSE 148* 118* 130* 269* 156*  BUN 45* 53* 57* 60* 67*  CREATININE 1.95* 1.81* 1.92* 1.83* 1.87*  CALCIUM 8.3* 8.0* 8.2* 7.7* 8.2*    Liver Function Tests:  Recent Labs Lab 09/30/14 0431 10/01/14 0435 10/02/14 0500 10/03/14 0450 10/04/14 0500  AST 81* 70* 68* 67* 58*  ALT 57* 55 52 52 50  ALKPHOS 323* 295* 281* 260* 237*  BILITOT 3.0* 2.7* 2.4*  2.3* 2.1*  PROT 5.5* 5.4* 5.3* 5.2* 5.5*  ALBUMIN 2.4* 2.3* 2.3* 2.2* 2.1*   No results for input(s): LIPASE, AMYLASE in the last 168 hours. No results for input(s): AMMONIA in the last 168 hours.  CBC:  Recent Labs Lab 10/01/14 0435 10/02/14 0500 10/03/14 0450 10/04/14 0500 10/05/14 0400  WBC 11.7* 12.9* 12.1* 13.5* 14.1*  NEUTROABS 10.2* 10.5* 9.9* 11.3* 11.8*  HGB 9.8* 9.9* 9.5* 9.2* 8.6*  HCT 30.3* 31.0* 30.7* 29.8* 27.7*  MCV 90.7 92.5 94.5 96.4 95.8  PLT 355 378 361 352 344    Cardiac Enzymes: No results for input(s): CKTOTAL, CKMB, CKMBINDEX, TROPONINI in the last 168 hours.  BNP: BNP (last 3 results)  Recent Labs  09/13/14 1310  BNP 96.8    ProBNP (last 3 results) No results for input(s): PROBNP in the last 8760 hours.    Other results:  Imaging: Dg Chest Port 1 View  10/05/2014   CLINICAL DATA:  74 year old male status post CABG. Bilateral chest tube placement. Initial encounter. Initial encounter.  EXAM: PORTABLE CHEST - 1 VIEW  COMPARISON:  0833 hr today and earlier.  FINDINGS: Portable AP semi upright view at 1342 hrs. Stable left chest tube. No left pneumothorax. Stable right chest tube, less conspicuous than that on the left. No right pneumothorax. Continued dense retrocardiac opacity suggesting collapse or consolidation.  Stable tracheostomy tube, visible  enteric tube, and right PICC line. Stable cardiac size and mediastinal contours. Sequelae of CABG. Pulmonary vascularity appears decreased without acute edema.  IMPRESSION: 1. Stable lines and tubes. No pneumothorax. Decreased pulmonary vascularity. 2. Left lower lobe collapse or consolidation.   Electronically Signed   By: Odessa FlemingH  Hall M.D.   On: 10/05/2014 13:52   Dg Chest Portable 1 View  10/05/2014   CLINICAL DATA:  Postop following insertion of bilateral pleural drainage catheters.  EXAM: PORTABLE CHEST - 1 VIEW  COMPARISON:  10/03/2014  FINDINGS: Small chest tube on the right projects over the lower hemi  thorax. Tip of the right chest tube projects over the mid left hemi thorax. There has been a decrease in pleural effusions, greater on the right than the left. Residual left lung base opacity obscures hemidiaphragm. Right hemidiaphragm is well-defined.  There is central vascular congestion and mild interstitial prominence similar to the prior exam. Mild hazy opacity is noted in a perihilar distribution on the left. Mild pulmonary edema is suspected.  No pneumothorax.  Tracheostomy tube, enteric feeding tube and right-sided PICC is stable.  IMPRESSION: 1. Decreased pleural effusions following chest tube insertion on each side, with greater drainage of the right pleural effusion than the left. 2. Findings suggest mild pulmonary edema. 3. No pneumothorax.   Electronically Signed   By: Amie Portlandavid  Ormond M.D.   On: 10/05/2014 09:20   Dg Abd Portable 1v  10/05/2014   CLINICAL DATA:  Recent feeding catheter placed  EXAM: PORTABLE ABDOMEN - 1 VIEW  COMPARISON:  09/30/2014  FINDINGS: Feeding catheter is noted in the proximal portion of the jejunum in satisfactory position. Bilateral PleurX catheters are noted. The previously seen nasogastric catheter is been removed in the interval from the prior exam.  IMPRESSION: Feeding catheter within the proximal jejunum.   Electronically Signed   By: Alcide CleverMark  Lukens M.D.   On: 10/05/2014 16:34   Dg C-arm 1-60 Min-no Report  10/05/2014   CLINICAL DATA: surgery   C-ARM 1-60 MINUTES  Fluoroscopy was utilized by the requesting physician.  No radiographic  interpretation.      Medications:     Scheduled Medications:   Infusions:   PRN Medications:    Assessment:   1. Cardiogenic shock 2. Acute systolic HF EF 25-30% by TEE 4/15 3. Acute anterolateral STEMI    --Emergent CABG 4/13 4. Acute respiratory failure   --s/p trach 4/26 5. Thrombocytopenia 6. Acute renal failure, stage III 7. Hyponatremia/hyponkalemia 8. PAF with RVR 9. Hyperbilirubinemia  Plan/Discussion:     He is s/p trach.   Continues to improve slowly but still pressor dependent. EF 50% however he has been unable to get off levophed. Continue midodrine - I am hesitant to go much higher than 30mg /day. Volume status improved. Will continue low-dose demadex.   Maintaining NSR on amio. Ileus has improved.   LTAC consult placed to [facility] today.    Length of Stay:  Arvilla MeresBensimhon, Gwynn Crossley MD  10/05/2014, 5:35 PM  Advanced Heart Failure Team Pager [phone] (M-F; 7a - 4p)  Please contact [facility] for night-coverage after hours (4p -7a ) and weekends on [email or web address]

## 2014-10-05 NOTE — Anesthesia Preprocedure Evaluation (Addendum)
Anesthesia Evaluation  Patient identified by MRN, date of birth, ID band Patient awake    Reviewed: Allergy & Precautions, NPO status , Patient's Chart, lab work & pertinent test results, reviewed documented beta blocker date and time   Airway Mallampati: Trach       Dental no notable dental hx.    Pulmonary shortness of breath, Current Smoker,  breath sounds clear to auscultation  Pulmonary exam normal       Cardiovascular hypertension, Pt. on medications and Pt. on home beta blockers + CAD, + Past MI, + CABG and +CHF Normal cardiovascular examRhythm:Regular Rate:Normal     Neuro/Psych negative neurological ROS  negative psych ROS   GI/Hepatic Neg liver ROS, GERD-  Medicated and Controlled,  Endo/Other  negative endocrine ROS  Renal/GU negative Renal ROS     Musculoskeletal negative musculoskeletal ROS (+)   Abdominal   Peds  Hematology negative hematology ROS (+)   Anesthesia Other Findings   Reproductive/Obstetrics negative OB ROS                           Anesthesia Physical Anesthesia Plan  ASA: IV  Anesthesia Plan: MAC   Post-op Pain Management:    Induction: Intravenous  Airway Management Planned:   Additional Equipment:   Intra-op Plan:   Post-operative Plan:   Informed Consent: I have reviewed the patients History and Physical, chart, labs and discussed the procedure including the risks, benefits and alternatives for the proposed anesthesia with the patient or authorized representative who has indicated his/her understanding and acceptance.   Dental advisory given  Plan Discussed with: CRNA and Anesthesiologist  Anesthesia Plan Comments:        Anesthesia Quick Evaluation                                  Anesthesia Evaluation  Patient identified by MRN, date of birth, ID band Patient awake  General Assessment Comment:Remained intubated since CABG  4/13  Reviewed: Allergy & Precautions, NPO status , Patient's Chart, lab work & pertinent test results  Airway Mallampati: Intubated  TM Distance: >3 FB Neck ROM: Full    Dental no notable dental hx.    Pulmonary neg pulmonary ROS, Current Smoker,  breath sounds clear to auscultation+ rhonchi         Cardiovascular hypertension, + Past MI and + CABG Rhythm:Regular Rate:Normal     Neuro/Psych negative neurological ROS  negative psych ROS   GI/Hepatic negative GI ROS, Neg liver ROS,   Endo/Other  negative endocrine ROS  Renal/GU negative Renal ROS  negative genitourinary   Musculoskeletal negative musculoskeletal ROS (+)   Abdominal   Peds negative pediatric ROS (+)  Hematology negative hematology ROS (+)   Anesthesia Other Findings   Reproductive/Obstetrics negative OB ROS                             Anesthesia Physical Anesthesia Plan  ASA: IV  Anesthesia Plan: General   Post-op Pain Management:    Induction: Inhalational  Airway Management Planned: Oral ETT  Additional Equipment:   Intra-op Plan:   Post-operative Plan: Post-operative intubation/ventilation  Informed Consent: I have reviewed the patients History and Physical, chart, labs and discussed the procedure including the risks, benefits and alternatives for the proposed anesthesia with the patient or authorized representative who has indicated  his/her understanding and acceptance.   Dental advisory given  Plan Discussed with: CRNA and Surgeon  Anesthesia Plan Comments:         Anesthesia Quick Evaluation                                   Anesthesia Evaluation  Patient identified by MRN, date of birth, ID band Patient awake    Reviewed: Allergy & Precautions, H&P , NPO status , Patient's Chart, lab work & pertinent test results, reviewed documented beta blocker date and time   Airway Mallampati: II  TM Distance: <3 FB      Dental  (+) Edentulous Upper, Edentulous Lower   Pulmonary shortness of breath, COPDCurrent Smoker,  breath sounds clear to auscultation  - wheezing      Cardiovascular hypertension, Pt. on home beta blockers and Pt. on medications + Past MI + dysrhythmias (palpitations) Rhythm:Regular     Neuro/Psych PSYCHIATRIC DISORDERS Anxiety Depression negative neurological ROS  negative psych ROS   GI/Hepatic Neg liver ROS, GERD-  Medicated,  Endo/Other  negative endocrine ROS  Renal/GU negative Renal ROS     Musculoskeletal negative musculoskeletal ROS (+)   Abdominal   Peds  Hematology negative hematology ROS (+)   Anesthesia Other Findings   Reproductive/Obstetrics negative OB ROS                             Anesthesia Physical  Anesthesia Plan  ASA: V and emergent  Anesthesia Plan: General   Post-op Pain Management:    Induction: Intravenous  Airway Management Planned: Oral ETT  Additional Equipment: Arterial line, CVP, PA Cath, Ultrasound Guidance Line Placement and TEE  Intra-op Plan:   Post-operative Plan: Post-operative intubation/ventilation  Informed Consent: I have reviewed the patients History and Physical, chart, labs and discussed the procedure including the risks, benefits and alternatives for the proposed anesthesia with the patient or authorized representative who has indicated his/her understanding and acceptance.   Dental advisory given  Plan Discussed with: CRNA  Anesthesia Plan Comments:         Anesthesia Quick Evaluation

## 2014-10-05 NOTE — Progress Notes (Signed)
Day of Surgery Procedure(s) (LRB): INSERTION PLEURAL DRAINAGE CATHETER (Bilateral) Subjective: Patient being slowly weaned off ventilator to trach collar Placement of bilateral Pleurx catheters with total 2 L of pleural fluid removed today should assist with ventilator wean Patient remains dependent on low-dose norepinephrine and will continue to slowly wean Incisions are clean, sputum culture rechecked for white count 14,000 Nutrition via Panda tube at goal  Objective: Vital signs in last 24 hours: Temp:  [98.3 F (36.8 C)-99.7 F (37.6 C)] 99.7 F (37.6 C) (05/05 0344) Pulse Rate:  [88-110] 102 (05/05 0715) Cardiac Rhythm:  [-] Normal sinus rhythm (05/05 0000) Resp:  [14-31] 17 (05/05 0715) BP: (83-120)/(40-91) 116/55 mmHg (05/05 0715) SpO2:  [98 %-100 %] 100 % (05/05 0715) FiO2 (%):  [40 %] 40 % (05/05 0931) Weight:  [156 lb 15.5 oz (71.2 kg)] 156 lb 15.5 oz (71.2 kg) (05/05 0500)  Hemodynamic parameters for last 24 hours:   Stable Intake/Output from previous day: 05/04 0701 - 05/05 0700 In: 1521.3 [I.V.:571.3; NG/GT:850; IV Piggyback:100] Out: 2660 [Urine:2660] Intake/Output this shift: Total I/O In: 100 [I.V.:100] Out: -   Breath sounds improved after bilateral Pleurx catheter drainage  Lab Results:  Recent Labs  10/04/14 0500 10/05/14 0400  WBC 13.5* 14.1*  HGB 9.2* 8.6*  HCT 29.8* 27.7*  PLT 352 344   BMET:  Recent Labs  10/03/14 0450 10/04/14 0500  NA 140 137  K 3.4* 3.3*  CL 103 99*  CO2 27 28  GLUCOSE 130* 269*  BUN 57* 60*  CREATININE 1.92* 1.83*  CALCIUM 8.2* 7.7*    PT/INR:  Recent Labs  10/04/14 1100  LABPROT 15.0  INR 1.16   ABG    Component Value Date/Time   PHART 7.395 10/01/2014 0421   HCO3 21.8 10/01/2014 0421   TCO2 23 10/01/2014 0421   ACIDBASEDEF 3.0* 10/01/2014 0421   O2SAT 81.6 10/04/2014 0407   CBG (last 3)   Recent Labs  10/04/14 1917 10/04/14 2324 10/05/14 0325  GLUCAP 121* 114* 98     Assessment/Plan: S/P Procedure(s) (LRB): INSERTION PLEURAL DRAINAGE CATHETER (Bilateral) Drain Pleurx catheters daily Continued with physical therapy Patient ready for LTAC placement Wean norepinephrine to maintain systolic blood pressure greater than 100   LOS: 22 days    [name] 10/05/2014

## 2014-10-05 NOTE — Progress Notes (Signed)
Sputum sample obtained without complication. Sample sent to lab

## 2014-10-05 NOTE — Transfer of Care (Signed)
Immediate Anesthesia Transfer of Care Note  Patient: Justin MattocksDennis W Arther  Procedure(s) Performed: Procedure(s): INSERTION PLEURAL DRAINAGE CATHETER (Bilateral)  Patient Location: SICU  Anesthesia Type:General  Level of Consciousness: awake and oriented  Airway & Oxygen Therapy: Patient Spontanous Breathing and Patient connected to T-piece oxygen  Post-op Assessment: Report given to RN, Post -op Vital signs reviewed and stable and Patient moving all extremities  Post vital signs: Reviewed and stable  Last Vitals:  Filed Vitals:   10/05/14 0715  BP: 116/55  Pulse: 102  Temp:   Resp: 17    Complications: No apparent anesthesia complications

## 2014-10-05 NOTE — Progress Notes (Signed)
Pt in OR during first round check. Will assess pt when returned from OR

## 2014-10-05 NOTE — Progress Notes (Signed)
[facility] and [facility] therapy. Stable, no distress. Accepted by Bari EdwardJoseph RN on [facility].

## 2014-10-05 NOTE — Anesthesia Procedure Notes (Signed)
Procedure Name: MAC Date/Time: 10/05/2014 7:36 AM Performed by: Charm BargesBUTLER, Zebulun Deman R Pre-anesthesia Checklist: Patient identified, Emergency Drugs available, Suction available, Patient being monitored and Timeout performed Patient Re-evaluated:Patient Re-evaluated prior to inductionOxygen Delivery Method: Circle system utilized Preoxygenation: Pre-oxygenation with 100% oxygen Intubation Type: Tracheostomy Placement Confirmation: positive ETCO2

## 2014-10-05 NOTE — Op Note (Signed)
NAMMarzella Knight:  Knight, [name]               ACCOUNT NO.:  [id number]  MEDICAL RECORD NO.:  [id number]  LOCATION:  2S14C                        FACILITY:  [facility]  PHYSICIAN:  [name], M.D.  DATE OF BIRTH:  21-May-1941  DATE OF PROCEDURE:  10/05/2014 DATE OF DISCHARGE:  10/05/2014                              OPERATIVE REPORT   OPERATION: 1. Placement of left PleurX catheter for postoperative left pleural     effusion. 2. Placement of right PleurX catheter for postoperative right pleural     effusion.  SURGEON:  Justin PernaPeter Van Trigt, MD  ANESTHESIA:  IV monitored conscious sedation by anesthesia and local 1% lidocaine.  PREOPERATIVE DIAGNOSIS:  Status post emergency coronary artery bypass graft for acute myocardial infarction with cardiogenic shock, and preoperative percutaneous left ventricular assist device, persistent postop bilateral pleural effusions.  POSTOPERATIVE DIAGNOSES:  Status post emergency coronary artery bypass graft for acute myocardial infarction with cardiogenic shock, and preoperative percutaneous left ventricular assist device, persistent postop bilateral pleural effusions.  OPERATIVE PROCEDURE:  The patient was brought directly from the ICU back to the operating room and placed supine on the operating room table. The chest was prepped and draped on both sides as a sterile field.  A proper time-out was performed.  Local lidocaine was infiltrated in the anterior axillary line at the fifth interspace.  A small incision was made.  Further infiltration to the chest wall was performed with lidocaine.  A needle was passed into the left pleural space which withdrew serosanguineous fluid.  A second small incision was made at the midclavicular line at the left costal margin.  This was anesthetized first with lidocaine.  The PleurX catheter was tunneled from the lower incision to the upper incision and divided to the appropriate length.  Through the upper incision, a  needle sheath was inserted and pleural fluid was removed.  Through the sheath, a guidewire was passed in the left pleural space.  This was confirmed to be in the proper position by C-arm fluoroscopy.  Next, over the guidewire, a dilator, then a dilator sheath system was inserted in the left pleural space.  The dilator and wire were removed and fluid drained immediately.  The tear-away sheath was then peeled back as the PleurX catheter was inserted into the pleural space.  The catheter drained 700 mL of serosanguineous fluid.  The upper incision was closed with subcutaneous interrupted 3-0 Vicryl and interrupted nylon skin sutures.  The catheter exit site was closed with a #1 silk suture which was used to secure the catheter to the skin as well. Sterile dressings were later applied.  Attention was then directed to the right pleural effusion.  Local 1% lidocaine was used to infiltrate 2 areas; one at the anterior axillary line at the fifth interspace and the other lower at the midclavicular line at the right costal margin.  Two small incisions were made in each area.  Through the upper incision, a small needle was used to identify the location of the right pleural effusion.  Next, the PleurX catheter was tunneled from the lower incision to the upper incision and divided to the proper length.  Next, through the upper incision  a needle sheath system was inserted and pleural fluid aspirated.  The needle was removed and a guidewire was inserted.  C-arm fluoroscopy confirmed the guidewire to be in appropriate position.  Over the guidewire dilator, then a dilator tear-away sheath system was inserted.  The guidewire and the wire were removed.  The PleurX catheter was then placed into the tear-away sheath which immediately drained serosanguineous pleural fluid.  Thus, the tear-away sheath was peeled back and the PleurX was advanced into the pleural space.  This was in the appropriate  position as documented by a followup chest x-ray, taken in the operating room, which showed good drainage of each pleural effusion without evidence of pneumothorax. The upper right incision was closed with interrupted subcutaneous Vicryl and interrupted nylon skin sutures.  The lower catheter exit site was closed with a #1 silk which was used to secure the catheter to the skin as well.  Sterile dressings were applied.  The patient returned to the ICU in stable condition.     [name], M.D.     PV/MEDQ  D:  10/05/2014  T:  10/05/2014  Job:  [id number]

## 2014-10-05 NOTE — Progress Notes (Signed)
[facility] Riley KillJoseph Furrgay RN  Received. MD at bedside currently. Arranging respiratory therapy for transport as soon as possible.

## 2014-10-05 NOTE — Progress Notes (Signed)
Pt to OR with anesthesia.

## 2014-10-05 NOTE — OR Nursing (Signed)
 of pleural fluid from left side, of pleural fluid from right side removed during procedure.

## 2014-10-05 NOTE — Progress Notes (Signed)
CBG drawn at 1137 10/05/14 incorrect specimen tested by CNA. Result should have been rejected.

## 2014-10-05 NOTE — Progress Notes (Signed)
Arrived back from OR stable, morning medications delayed due to OR procedure.  Will continue to monitor very closely.

## 2014-10-05 NOTE — Brief Op Note (Addendum)
09/13/2014 - 10/05/2014  8:44 AM  PATIENT:  [name]  74 y.o. male  PRE-OPERATIVE DIAGNOSIS:  BILATERAL PLEURAL EFFUSIONS  POST-OPERATIVE DIAGNOSIS:  BILATERAL PLEURAL EFFUSIONS  PROCEDURE:  Procedure(s): INSERTION PLEURAL DRAINAGE CATHETER (Bilateral)  SURGEON:  Surgeon(s) and Role:    Kerin Perna* Charmagne Buhl Van Trigt, MD - Primary  PHYSICIAN ASSISTANT:   ASSISTANTS: none   ANESTHESIA:   local and IV sedation  EBL:  Total I/O In: 100 [I.V.:100] Out: -  L pleural fluid 700cc  R pleural fluid 1300 cc  BLOOD ADMINISTERED:none  DRAIN bilateral pleurx catheters  LOCAL MEDICATIONS USED:  LIDOCAINE  and Amount: 12 ml  SPECIMEN:  Aspirate  DISPOSITION OF SPECIMEN:  microbiology  COUNTS:  YES    Postop cxr in room- no pneumothorax, both effusions drained  TOURNIQUET:  * No tourniquets in log *  DICTATION: .Dragon Dictation  PLAN OF CARE: Admit to inpatient   PATIENT DISPOSITION:  ICU - intubated and hemodynamically stable.   Delay start of Pharmacological VTE agent (>24hrs) due to surgical blood loss or risk of bleeding: yes

## 2014-10-05 NOTE — Progress Notes (Signed)
UR Completed.  [phone]  

## 2014-10-06 ENCOUNTER — Other Ambulatory Visit (HOSPITAL_COMMUNITY): Payer: Self-pay

## 2014-10-06 DIAGNOSIS — E46 Unspecified protein-calorie malnutrition: Secondary | ICD-10-CM

## 2014-10-06 DIAGNOSIS — R5381 Other malaise: Secondary | ICD-10-CM

## 2014-10-06 DIAGNOSIS — J9601 Acute respiratory failure with hypoxia: Secondary | ICD-10-CM

## 2014-10-06 DIAGNOSIS — J441 Chronic obstructive pulmonary disease with (acute) exacerbation: Secondary | ICD-10-CM

## 2014-10-06 DIAGNOSIS — J90 Pleural effusion, not elsewhere classified: Secondary | ICD-10-CM

## 2014-10-06 LAB — BLOOD GAS, ARTERIAL
ACID-BASE EXCESS: 8.4 mmol/L — AB (ref 0.0–2.0)
BICARBONATE: 33.3 meq/L — AB (ref 20.0–24.0)
FIO2: 0.4 %
MECHVT: 500 mL
O2 Saturation: 96.4 %
PEEP: 5 cmH2O
PH ART: 7.404 (ref 7.350–7.450)
PO2 ART: 86.8 mmHg (ref 80.0–100.0)
Patient temperature: 98.6
RATE: 12 resp/min
TCO2: 34.9 mmol/L (ref 0–100)
pCO2 arterial: 54.3 mmHg — ABNORMAL HIGH (ref 35.0–45.0)

## 2014-10-06 LAB — COMPREHENSIVE METABOLIC PANEL
ALK PHOS: 228 U/L — AB (ref 38–126)
ALT: 32 U/L (ref 17–63)
ANION GAP: 9 (ref 5–15)
AST: 53 U/L — ABNORMAL HIGH (ref 15–41)
Albumin: 2.5 g/dL — ABNORMAL LOW (ref 3.5–5.0)
BUN: 66 mg/dL — ABNORMAL HIGH (ref 6–20)
CALCIUM: 8.6 mg/dL — AB (ref 8.9–10.3)
CO2: 32 mmol/L (ref 22–32)
Chloride: 102 mmol/L (ref 101–111)
Creatinine, Ser: 1.82 mg/dL — ABNORMAL HIGH (ref 0.61–1.24)
GFR calc Af Amer: 41 mL/min — ABNORMAL LOW (ref >60)
GFR calc non Af Amer: 36 mL/min — ABNORMAL LOW (ref >60)
GLUCOSE: 133 mg/dL — AB (ref 70–99)
POTASSIUM: 4 mmol/L (ref 3.5–5.1)
SODIUM: 143 mmol/L (ref 135–145)
TOTAL PROTEIN: 5.8 g/dL — AB (ref 6.5–8.1)
Total Bilirubin: 2 mg/dL — ABNORMAL HIGH (ref 0.3–1.2)

## 2014-10-06 LAB — TSH: TSH: 4.218 u[IU]/mL (ref 0.350–4.500)

## 2014-10-06 LAB — CBC
HEMATOCRIT: 32.4 % — AB (ref 39.0–52.0)
Hemoglobin: 9.9 g/dL — ABNORMAL LOW (ref 13.0–17.0)
MCH: 29.7 pg (ref 26.0–34.0)
MCHC: 30.6 g/dL (ref 30.0–36.0)
MCV: 97.3 fL (ref 78.0–100.0)
Platelets: 375 10*3/uL (ref 150–400)
RBC: 3.33 MIL/uL — AB (ref 4.22–5.81)
RDW: 18.5 % — ABNORMAL HIGH (ref 11.5–15.5)
WBC: 16.2 10*3/uL — AB (ref 4.0–10.5)

## 2014-10-06 LAB — CULTURE, RESPIRATORY W GRAM STAIN: Special Requests: NORMAL

## 2014-10-06 NOTE — Consult Note (Signed)
Name: Justin MattocksDennis W Deshler MRN: [id number] DOB: [birth year]    ADMISSION DATE:  10/05/2014 CONSULTATION DATE:  10/06/14  REFERRING MD :  Dr. Sharyon MedicusHijazi   CHIEF COMPLAINT:  Acute Respiratory Failure, Bilateral Pleural Effusions s/p bilateral PleurX   BRIEF PATIENT DESCRIPTION: 74 y/o M with recent prolonged admission for STEMI s/p PCI, CABG with subsequent cardiogenic shock.  He is s/p trach/PEG placement.  Tx to [facility] 5/5 for further weaning / rehab efforts.    SIGNIFICANT EVENTS  4/13 - 5/5  [facility], s/p PCI, CABG x3, LVAD placement, trach/PEG, hemolysis, bilateral pleurx   STUDIES:  5/06  CXR >> lines/tubs in good position, mild increase in pulm venous congestion, bibasilar atx   HISTORY OF PRESENT ILLNESS:  74 y/o M, smoker, with PMH of anxiety/depression, GERD, HTN, HLD, and appendectomy who [facility] 10/05/14 for STEMI with prolonged ICU course.    The [facility] with complaints of epigastric pain, nausea and SOB.  Work up was positive for STEMI.  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  Subsequently, he developed cardiogenic shock requiring vasopressors and Impella LVAD,  He further was taken to the OR emergently for surgical revascularization.  Early post-operative course was complicated by cardiac arrest & increased bloody drainage from chest tubes.  Cardiac arrest was felt related to tamponade from occlusion of his chest tubes.  He remained asystolic under pacer with full LVAD support. Advanced Heart Failure SVC followed.  ICU course further complicated by atrial fibrillation (initially converted but has had intermittent PAF), CHF, pneumonia and significant hemolysis.  Impella device was removed with hemolysis.  He developed AFRVR and required DCCV.  He developed jaundice and GI was consulted - US abd showed  cholelithiasis with mild gallbladder wall thickening.  GI rec's were consideration for percutaneous cholecystostomy if he did not improve.  He was treated for HCAP.  He had multiple failed attempts at weaning from MV and was subsequently taken to the OR on 4/26 for tracheostomy placement.  In the setting of protein calorie malnutrition / critical illness, he continued to have third spacing and recurrent pleural effusions.  The patient was taken to the OR on 5/5 for bilateral pleurX cath placement and [facility] / weaning efforts.    [facility]    PAST MEDICAL HISTORY :   has a past medical history of Dyspnea; Palpitations; Anxiety and depression; GERD (gastroesophageal reflux disease); Hypertension; and Hypercholesteremia.  has past surgical history that includes Appendectomy; Cataract extraction w/PHACO (05/06/2012); Cataract extraction w/PHACO (05/24/2012); Cardiac catheterization (09/13/2014); Cardiac catheterization (09/13/2014); coronary angiogram (09/13/2014); Coronary artery bypass graft (N/A, 09/13/2014); patent foramen ovale closure (09/13/2014); Tracheostomy tube placement (N/A, 09/26/2014); and Chest tube insertion (Left, 09/26/2014).   Prior to Admission medications   Medication Sig Start Date End Date Taking? Authorizing Provider  acetaminophen (TYLENOL) 160 MG/5ML solution Place 10.2 mLs (325 mg total) into feeding tube every 4 (four) hours as needed for moderate pain. 10/05/14   Wilmon PaliGina L Collins, PA-C  ALPRAZolam (XANAX) 0.5 MG tablet Place 1 tablet (0.5 mg total) into feeding tube 3 (three) times daily. 10/05/14   Wilmon PaliGina L Collins, PA-C  Amino Acids-Protein Hydrolys (FEEDING SUPPLEMENT, PRO-STAT SUGAR FREE 64,) LIQD Place 30 mLs into feeding  tube 2 (two) times daily. 10/05/14   Wilmon Pali, PA-C  amiodarone (PACERONE) 200 MG tablet Place 1 tablet (200 mg total) into feeding tube 2 (two) times daily. 10/05/14   Wilmon Pali, PA-C    antiseptic oral rinse (CPC / CETYLPYRIDINIUM CHLORIDE 0.05%) 0.05 % LIQD solution 7 mLs by Mouth Rinse route QID. 10/05/14   Wilmon Pali, PA-C  aspirin 81 MG chewable tablet Place 4 tablets (324 mg total) into feeding tube daily. 10/05/14   Wilmon Pali, PA-C  atorvastatin (LIPITOR) 40 MG tablet Place 1 tablet (40 mg total) into feeding tube daily at 6 PM. 10/05/14   Wilmon Pali, PA-C  budesonide (PULMICORT) 0.5 MG/2ML nebulizer solution Take 2 mLs (0.5 mg total) by nebulization 2 (two) times daily. 10/05/14   Wilmon Pali, PA-C  chlorhexidine (PERIDEX) 0.12 % solution 15 mLs by Mouth Rinse route 2 (two) times daily. 10/05/14   Wilmon Pali, PA-C  digoxin (LANOXIN) 0.125 MG tablet Place 1 tablet (0.125 mg total) into feeding tube every other day. 10/05/14   Wilmon Pali, PA-C  enoxaparin (LOVENOX) 30 MG/0.3ML injection Inject 0.3 mLs (30 mg total) into the skin daily. 10/06/14   Wilmon Pali, PA-C  insulin detemir (LEVEMIR) 100 UNIT/ML injection Inject 0.15 mLs (15 Units total) into the skin 2 (two) times daily. 10/05/14   Wilmon Pali, PA-C  ipratropium-albuterol (DUONEB) 0.5-2.5 (3) MG/3ML SOLN Take 3 mLs by nebulization every 2 (two) hours as needed. 10/05/14   Wilmon Pali, PA-C  levalbuterol (XOPENEX) 0.63 MG/3ML nebulizer solution Take 3 mLs (0.63 mg total) by nebulization every 3 (three) hours as needed for wheezing or shortness of breath. 10/05/14   Wilmon Pali, PA-C  metoCLOPramide (REGLAN) 5 MG/ML injection Inject 2 mLs (10 mg total) into the vein every 12 (twelve) hours. 10/05/14   Wilmon Pali, PA-C  midodrine (PROAMATINE) 10 MG tablet Place 1 tablet (10 mg total) into feeding tube 3 (three) times daily with meals. 10/05/14   Wilmon Pali, PA-C  nicotine (NICODERM CQ - DOSED IN MG/24 HOURS) 14 mg/24hr patch Place 1 patch (14 mg total) onto the skin daily. 10/05/14   Wilmon Pali, PA-C  norepinephrine 16 mg in dextrose 5 % 250 mL Inject 0-40 mcg/min into the vein continuous. 10/05/14    Wilmon Pali, PA-C  Nutritional Supplements (FEEDING SUPPLEMENT, VITAL 1.5 CAL,) LIQD Place 1,000 mLs into feeding tube daily. 10/05/14   Wilmon Pali, PA-C  ondansetron (ZOFRAN) 4 MG/2ML SOLN injection Inject 2 mLs (4 mg total) into the vein every 6 (six) hours as needed for nausea or vomiting. 10/05/14   Wilmon Pali, PA-C  pantoprazole sodium (PROTONIX) 40 mg/20 mL PACK Place 20 mLs (40 mg total) into feeding tube daily. 10/05/14   Wilmon Pali, PA-C  QUEtiapine (SEROQUEL) 50 MG tablet Place 1 tablet (50 mg total) into feeding tube at bedtime. 10/05/14   Wilmon Pali, PA-C  torsemide (DEMADEX) 20 MG tablet Place 1 tablet (20 mg total) into feeding tube 2 (two) times daily. 10/05/14   Wilmon Pali, PA-C   Allergies  Allergen Reactions  . Penicillins Nausea And Vomiting    FAMILY HISTORY:  family history includes Coronary artery disease in an other family member.   SOCIAL HISTORY:  reports that he has been smoking Cigarettes.  He has a 40 pack-year smoking history. He does not have any smokeless tobacco history on file. He reports that  he does not drink alcohol.  REVIEW OF SYSTEMS:  Unable to complete as patient is on mechanical ventilation.  Information obtained from staff at bedside and previous medical documentation   SUBJECTIVE:   VITAL SIGNS: 98.1, 93, 20, 94/52, 100%   PHYSICAL EXAMINATION: General:  Chronically ill appearing adult male in NAD on vent  Neuro:  Awake/alert, generalized weakness HEENT:  #7.0 Shiley cuffed trach midline Cardiovascular:  s1s2 distant, irregular Lungs:  resp's even/non-labored, lungs bilaterally diminished with few L crackles  Abdomen:  NTND, bsx4 active, +flatus Musculoskeletal:  No acute deformities  Skin:  Warm/dry, 1+ pitting pretibial edema   Recent Labs Lab 10/04/14 0500 10/05/14 1306 10/06/14 0545  NA 137 144 143  K 3.3* 3.9 4.0  CL 99* 104 102  CO2 28 31 32  BUN 60* 67* 66*  CREATININE 1.83* 1.87* 1.82*  GLUCOSE 269* 156*  133*    Recent Labs Lab 10/04/14 0500 10/05/14 0400 10/06/14 0545  HGB 9.2* 8.6* 9.9*  HCT 29.8* 27.7* 32.4*  WBC 13.5* 14.1* 16.2*  PLT 352 344 375   Dg Chest Port 1 View  10/06/2014   CLINICAL DATA:  Respiratory failure.  EXAM: PORTABLE CHEST - 1 VIEW  COMPARISON:  None.  FINDINGS: Tracheostomy to NG tube, right PICC line, bilateral chest tubes in stable position. Mediastinum hilar structures are stable. Prior CABG. Slight increase in pulmonary vascularity with diffuse bilateral pulmonary alveolar infiltrates now present on today's exam. Bilateral pleural effusions are present. These findings are consistent with congestive heart failure. Bibasilar atelectasis present. Basilar pneumonia cannot be excluded . No pneumothorax.  IMPRESSION: 1. Right PICC line and bilateral chest tubes in stable position. No pneumothorax. 2. Prior CABG. Interim slight increase in pulmonary venous congestion. Interim appearance of bilateral pulmonary alveolar infiltrates and bilateral small pleural effusions. These findings are consistent with congestive heart failure. 3. Persistent bibasilar atelectasis. Superimposed bibasilar pneumonia cannot be excluded.   Electronically Signed   By: Maisie Fushomas  Register   On: 10/06/2014 07:26   Dg Chest Port 1 View  10/05/2014   CLINICAL DATA:  74 year old male status post CABG. Bilateral chest tube placement. Initial encounter. Initial encounter.  EXAM: PORTABLE CHEST - 1 VIEW  COMPARISON:  0833 hr today and earlier.  FINDINGS: Portable AP semi upright view at 1342 hrs. Stable left chest tube. No left pneumothorax. Stable right chest tube, less conspicuous than that on the left. No right pneumothorax. Continued dense retrocardiac opacity suggesting collapse or consolidation.  Stable tracheostomy tube, visible enteric tube, and right PICC line. Stable cardiac size and mediastinal contours. Sequelae of CABG. Pulmonary vascularity appears decreased without acute edema.  IMPRESSION: 1. Stable  lines and tubes. No pneumothorax. Decreased pulmonary vascularity. 2. Left lower lobe collapse or consolidation.   Electronically Signed   By: Odessa FlemingH  Hall M.D.   On: 10/05/2014 13:52   Dg Chest Portable 1 View  10/05/2014   CLINICAL DATA:  Postop following insertion of bilateral pleural drainage catheters.  EXAM: PORTABLE CHEST - 1 VIEW  COMPARISON:  10/03/2014  FINDINGS: Small chest tube on the right projects over the lower hemi thorax. Tip of the right chest tube projects over the mid left hemi thorax. There has been a decrease in pleural effusions, greater on the right than the left. Residual left lung base opacity obscures hemidiaphragm. Right hemidiaphragm is well-defined.  There is central vascular congestion and mild interstitial prominence similar to the prior exam. Mild hazy opacity is noted in a perihilar distribution on the  left. Mild pulmonary edema is suspected.  No pneumothorax.  Tracheostomy tube, enteric feeding tube and right-sided PICC is stable.  IMPRESSION: 1. Decreased pleural effusions following chest tube insertion on each side, with greater drainage of the right pleural effusion than the left. 2. Findings suggest mild pulmonary edema. 3. No pneumothorax.   Electronically Signed   By: Amie Portland M.D.   On: 10/05/2014 09:20   Dg Abd Portable 1v  10/05/2014   CLINICAL DATA:  Recent feeding catheter placed  EXAM: PORTABLE ABDOMEN - 1 VIEW  COMPARISON:  09/30/2014  FINDINGS: Feeding catheter is noted in the proximal portion of the jejunum in satisfactory position. Bilateral PleurX catheters are noted. The previously seen nasogastric catheter is been removed in the interval from the prior exam.  IMPRESSION: Feeding catheter within the proximal jejunum.   Electronically Signed   By: Alcide Clever M.D.   On: 10/05/2014 16:34   Dg C-arm 1-60 Min-no Report  10/05/2014   CLINICAL DATA: surgery   C-ARM 1-60 MINUTES  Fluoroscopy was utilized by the requesting physician.  No radiographic   interpretation.     ASSESSMENT / PLAN:   Respiratory Failure s/p Tracheostomy - in setting of prolonged critical illness post STEMI / CABG Bilateral Pleural Effusions s/p PlerX placement  Tracheostomy Status  STEMI s/p CABG - complicated hospitalization with cardiogenic shock, LVAD, cardiac arrest Protein Calorie Malnutrition  Deconditioning of Critical Illness  Plan: Vent wean protocol, ok to begin PSV trials.  No ATC until pressors weaned off Trach care per protocol Pulmicort  Albuterol Q4 PRN Drain bilateral pleural catheters QOD up to 1L each.   Trend cxr for pleural effusions Monitor fever curve/leukocytosis  PT/OT  Canary Brim, NP-C [facility] Pgr: [phone] or [phone]  74 year old man well known to our service from prolonged hospitalization in the surgical ICU presenting to [facility], trached on vent.  I reviewed his CXR myself, bilateral pleurex catheters noted with bilateral pleural effusion.  Discussed with NP and Dr. [name] ([facility]-MD).  Acute respiratory failure s/p critical illness and cardiac surgery:  - Continue weaning per protocol, currently tolerating high PS, recommend gradually dropping PS prior to increasing time.  Tracheostomy status:  - Trach in place, may remove sutures after 7 days.  - Would not recommend decreasing size at this point until able to tolerate TC.  Bilateral pleural effusion likely related to critical illness, low protein and fluid shifting.  - Drain every other day but not more than a liter at a time.  - Every other day CXR prior to drainage.  - Tabulate and measure drainage to see how much is removed, hopefully decrease over time.  COPD history:  - Pulmicort as ordered.  - PRN albuterol.  - If PIP is high will consider adding an anti-cholinergic agent as well.  Deconditioning: severe due to chronic illness.  - OOB to chair as able even with vent.  - PT as ordered.  Protein calorie malnutrition, severe, a large  culprit in recurrences of pleural effusion.  - Nutritionist assistance with TF.  - Measure pre-albumin.  Patient seen and examined, agree with above note.  I dictated the care and orders written for this patient under my direction.  Alyson Reedy, MD [phone]  10/06/2014, 2:56 PM

## 2014-10-07 ENCOUNTER — Other Ambulatory Visit (HOSPITAL_COMMUNITY): Payer: Self-pay

## 2014-10-07 LAB — BASIC METABOLIC PANEL
ANION GAP: 9 (ref 5–15)
BUN: 65 mg/dL — ABNORMAL HIGH (ref 6–20)
CHLORIDE: 95 mmol/L — AB (ref 101–111)
CO2: 33 mmol/L — AB (ref 22–32)
Calcium: 8.2 mg/dL — ABNORMAL LOW (ref 8.9–10.3)
Creatinine, Ser: 1.69 mg/dL — ABNORMAL HIGH (ref 0.61–1.24)
GFR calc Af Amer: 45 mL/min — ABNORMAL LOW (ref >60)
GFR calc non Af Amer: 39 mL/min — ABNORMAL LOW (ref >60)
Glucose, Bld: 174 mg/dL — ABNORMAL HIGH (ref 70–99)
POTASSIUM: 3.8 mmol/L (ref 3.5–5.1)
SODIUM: 137 mmol/L (ref 135–145)

## 2014-10-08 LAB — BODY FLUID CULTURE: Gram Stain: NONE SEEN

## 2014-10-09 ENCOUNTER — Other Ambulatory Visit (HOSPITAL_COMMUNITY): Payer: Self-pay

## 2014-10-09 ENCOUNTER — Encounter (HOSPITAL_COMMUNITY): Payer: Self-pay | Admitting: Cardiothoracic Surgery

## 2014-10-09 DIAGNOSIS — I509 Heart failure, unspecified: Secondary | ICD-10-CM | POA: Insufficient documentation

## 2014-10-09 DIAGNOSIS — Z931 Gastrostomy status: Secondary | ICD-10-CM | POA: Insufficient documentation

## 2014-10-09 DIAGNOSIS — J90 Pleural effusion, not elsewhere classified: Secondary | ICD-10-CM | POA: Insufficient documentation

## 2014-10-09 DIAGNOSIS — I5020 Unspecified systolic (congestive) heart failure: Secondary | ICD-10-CM

## 2014-10-09 LAB — CBC
HEMATOCRIT: 29.3 % — AB (ref 39.0–52.0)
HEMOGLOBIN: 9.1 g/dL — AB (ref 13.0–17.0)
MCH: 30 pg (ref 26.0–34.0)
MCHC: 31.1 g/dL (ref 30.0–36.0)
MCV: 96.7 fL (ref 78.0–100.0)
Platelets: 347 10*3/uL (ref 150–400)
RBC: 3.03 MIL/uL — ABNORMAL LOW (ref 4.22–5.81)
RDW: 18.6 % — ABNORMAL HIGH (ref 11.5–15.5)
WBC: 13.3 10*3/uL — ABNORMAL HIGH (ref 4.0–10.5)

## 2014-10-09 LAB — BASIC METABOLIC PANEL
Anion gap: 8 (ref 5–15)
BUN: 51 mg/dL — AB (ref 6–20)
CALCIUM: 8.3 mg/dL — AB (ref 8.9–10.3)
CO2: 33 mmol/L — AB (ref 22–32)
CREATININE: 1.55 mg/dL — AB (ref 0.61–1.24)
Chloride: 98 mmol/L — ABNORMAL LOW (ref 101–111)
GFR calc Af Amer: 50 mL/min — ABNORMAL LOW (ref >60)
GFR, EST NON AFRICAN AMERICAN: 43 mL/min — AB (ref >60)
GLUCOSE: 138 mg/dL — AB (ref 70–99)
Potassium: 4.2 mmol/L (ref 3.5–5.1)
SODIUM: 139 mmol/L (ref 135–145)

## 2014-10-09 NOTE — Consult Note (Signed)
Name: Justin MattocksDennis W Knight MRN: [id number] DOB: 1940/07/19    ADMISSION DATE:  10/05/2014 CONSULTATION DATE:  10/06/14  REFERRING MD :  Dr. Sharyon MedicusHijazi   CHIEF COMPLAINT:  Acute Respiratory Failure, Bilateral Pleural Effusions s/p bilateral PleurX   BRIEF PATIENT DESCRIPTION: 74 y/o M with recent prolonged admission for STEMI s/p PCI, CABG with subsequent cardiogenic shock.  He is s/p trach/PEG placement.  [facility] / re[facility] efforts.    SIGNIFICANT EVENTS  4/13 - 5/5  [facility], s/p PCI, CABG x3, LVAD placement, trach/PEG, hemolysis, bilateral pleurx   STUDIES:  5/06  CXR >> lines/tubs in good position, mild increase in pulm venous congestion, bibasilar atx  SUBJECTIVE: some SOB on am weaning  VITAL SIGNS: 97.6 96 22 103/60 100%   PHYSICAL EXAMINATION: General:  Chronically ill appearing adult male in NAD on vent  Neuro:  Awakens, alert, cooperative, moves all ext HEENT:  #7.0 Shiley cuffed trach midline Cardiovascular:  s1s2 distant, irregular Lungs:  Crackles bases Abdomen:  NTND, bsx4 active, +flatus Musculoskeletal:  No acute deformities  Skin:  Warm/dry, 1+ pitting pretibial edema   Recent Labs Lab 10/06/14 0545 10/07/14 0702 10/09/14 1002  NA 143 137 139  K 4.0 3.8 4.2  CL 102 95* 98*  CO2 32 33* 33*  BUN 66* 65* 51*  CREATININE 1.82* 1.69* 1.55*  GLUCOSE 133* 174* 138*    Recent Labs Lab 10/05/14 0400 10/06/14 0545 10/09/14 1002  HGB 8.6* 9.9* 9.1*  HCT 27.7* 32.4* 29.3*  WBC 14.1* 16.2* 13.3*  PLT 344 375 347   Dg Chest Port 1 View  10/09/2014   CLINICAL DATA:  74 year old male with pleural effusion and congestive heart failure.  EXAM: PORTABLE CHEST - 1 VIEW  COMPARISON:  Chest x-ray 10/06/2014.  FINDINGS: A tracheostomy tube is in place with tip 8.4 cm above the carina. There is a right upper extremity PICC with tip terminating in the superior cavoatrial junction. A nasogastric tube is seen extending into the stomach,  however, the tip of the nasogastric tube extends below the lower margin of the image. Small bore left-sided chest tube in position with tip projecting over the mid left hemithorax. Small bore right-sided chest tube in position with tip directed toward the medial aspect of the base of the right hemithorax. Small right and small to moderate left pleural effusions appear similar to slightly decreased compared to recent prior examinations. No pneumothorax. Pulmonary venous congestion, without frank pulmonary edema. Bibasilar opacities favored to reflect subsegmental atelectasis (left greater than right). Heart size is normal. The patient is rotated to the left on today's exam, resulting in distortion of the mediastinal contours and reduced diagnostic sensitivity and specificity for mediastinal pathology. Atherosclerosis in the thoracic aorta. Status post median sternotomy for CABG.  IMPRESSION: 1. Postoperative changes and support apparatus, as above. 2. Slight decreased size of bilateral pleural effusions (small on the right and small to moderate on the left) with bilateral PleurX catheters in position. 3. Pulmonary venous congestion, without frank pulmonary edema. 4. Probable bibasilar subsegmental atelectasis.   Electronically Signed   By: Trudie Reedaniel  Entrikin M.D.   On: 10/09/2014 07:57   Dg Abd Portable 1v  10/07/2014   CLINICAL DATA:  NG tube placement.  EXAM: PORTABLE ABDOMEN - 1 VIEW  COMPARISON:  10/05/2014  FINDINGS: Nasogastric tube passes below the diaphragm into the mid stomach. Enteric tube seen previously has been removed.  There are air-filled loops of nondistended  bowel in the upper abdomen.  IMPRESSION: NG tube well positioned with its tip in the mid stomach.   Electronically Signed   By: Amie Portlandavid  Ormond M.D.   On: 10/07/2014 15:13    ASSESSMENT / PLAN:  Respiratory Failure s/p Tracheostomy - in setting of prolonged critical illness post STEMI / CABG Bilateral Pleural Effusions s/p PlerX placement    Tracheostomy Status  STEMI s/p CABG - complicated hospitalization with cardiogenic shock, LVAD, cardiac arrest Protein Calorie Malnutrition  Deconditioning of Critical Illness  Acute respiratory failure s/p critical illness and cardiac surgery: Tracheostomy status: Bilateral pleural effusion likely related to critical illness, low protein and fluid shifting. COPD history: Deconditioning: severe due to chronic illness. Protein calorie malnutrition, severe, a large culprit in recurrences of pleural effusion.   PLAN: CT output last 24 hrs = awaited and requested crt is improved, follow further with further neg balance with duiretic Weaning goals today PS 12 again , some SOB today If needed PS 15 to goal 8 hrs Levo noted still at 12 mics, consider titration to different goals, will d/w CHF service  Nelda Bucksaniel J Feinstein, MD [phone]  10/09/2014, 11:52 AM

## 2014-10-10 LAB — BASIC METABOLIC PANEL
Anion gap: 13 (ref 5–15)
BUN: 49 mg/dL — AB (ref 6–20)
CALCIUM: 8.1 mg/dL — AB (ref 8.9–10.3)
CO2: 32 mmol/L (ref 22–32)
Chloride: 102 mmol/L (ref 101–111)
Creatinine, Ser: 0.67 mg/dL (ref 0.61–1.24)
GFR calc Af Amer: >60 mL/min (ref >60)
GFR calc non Af Amer: >60 mL/min (ref >60)
GLUCOSE: 160 mg/dL — AB (ref 70–99)
Potassium: 3.8 mmol/L (ref 3.5–5.1)
Sodium: 147 mmol/L — ABNORMAL HIGH (ref 135–145)

## 2014-10-11 LAB — HEMOGLOBIN A1C
HEMOGLOBIN A1C: 7.6 % — AB (ref 4.8–5.6)
MEAN PLASMA GLUCOSE: 171 mg/dL

## 2014-10-12 DIAGNOSIS — Z4659 Encounter for fitting and adjustment of other gastrointestinal appliance and device: Secondary | ICD-10-CM | POA: Insufficient documentation

## 2014-10-12 LAB — CBC
HCT: 27.7 % — ABNORMAL LOW (ref 39.0–52.0)
Hemoglobin: 8.5 g/dL — ABNORMAL LOW (ref 13.0–17.0)
MCH: 29.9 pg (ref 26.0–34.0)
MCHC: 30.7 g/dL (ref 30.0–36.0)
MCV: 97.5 fL (ref 78.0–100.0)
PLATELETS: 290 10*3/uL (ref 150–400)
RBC: 2.84 MIL/uL — ABNORMAL LOW (ref 4.22–5.81)
RDW: 18.8 % — ABNORMAL HIGH (ref 11.5–15.5)
WBC: 8.9 10*3/uL (ref 4.0–10.5)

## 2014-10-12 LAB — BASIC METABOLIC PANEL
ANION GAP: 9 (ref 5–15)
BUN: 47 mg/dL — ABNORMAL HIGH (ref 6–20)
CALCIUM: 8.2 mg/dL — AB (ref 8.9–10.3)
CO2: 36 mmol/L — AB (ref 22–32)
Chloride: 93 mmol/L — ABNORMAL LOW (ref 101–111)
Creatinine, Ser: 1.54 mg/dL — ABNORMAL HIGH (ref 0.61–1.24)
GFR calc non Af Amer: 44 mL/min — ABNORMAL LOW (ref >60)
GFR, EST AFRICAN AMERICAN: 51 mL/min — AB (ref >60)
Glucose, Bld: 129 mg/dL — ABNORMAL HIGH (ref 65–99)
Potassium: 4.3 mmol/L (ref 3.5–5.1)
SODIUM: 138 mmol/L (ref 135–145)

## 2014-10-12 LAB — BODY FLUID CULTURE
Culture: NO GROWTH
Gram Stain: NONE SEEN

## 2014-10-12 LAB — HEMOGLOBIN A1C
Hgb A1c MFr Bld: 5.4 % (ref 4.8–5.6)
Mean Plasma Glucose: 108 mg/dL

## 2014-10-12 NOTE — Progress Notes (Signed)
   Name: Justin MattocksDennis W Leist MRN: [id number] DOB: 11/13/1940    ADMISSION DATE:  10/05/2014 CONSULTATION DATE:  10/06/14  REFERRING MD :  Dr. Sharyon MedicusHijazi   CHIEF COMPLAINT:  Acute Respiratory Failure, Bilateral Pleural Effusions s/p bilateral PleurX   BRIEF PATIENT DESCRIPTION: 74 y/o M with recent prolonged admission for STEMI s/p PCI, CABG with subsequent cardiogenic shock.  He is s/p trach/PEG placement.  [facility].    SIGNIFICANT EVENTS  4/13 - 5/5  [facility], s/p PCI, CABG x3, LVAD placement, trach/PEG, hemolysis, bilateral pleurx  5/12  Tolerating PSV 12/5  STUDIES:  5/06  CXR >> lines/tubs in good position, mild increase in pulm venous congestion, bibasilar atx  SUBJECTIVE: RT reports pt tolerating weaning 12/5.  Sr Cr rise noted, appears dry  VITAL SIGNS: 97.6 96 22 103/60 100%   PHYSICAL EXAMINATION: General:  Chronically ill appearing adult male in NAD on vent  Neuro:  Awakens, alert, cooperative, moves all ext HEENT:  #7.0 Shiley cuffed trach midline Cardiovascular:  s1s2 distant, irregular Lungs: even/non-labored, lungs bilaterally clear Abdomen:  NTND, bsx4 active, +flatus Musculoskeletal:  No acute deformities  Skin:  Warm/dry, no edema, appears dry   Recent Labs Lab 10/09/14 1002 10/10/14 0650 10/12/14 0545  NA 139 147* 138  K 4.2 3.8 4.3  CL 98* 102 93*  CO2 33* 32 36*  BUN 51* 49* 47*  CREATININE 1.55* 0.67 1.54*  GLUCOSE 138* 160* 129*    Recent Labs Lab 10/06/14 0545 10/09/14 1002 10/12/14 0545  HGB 9.9* 9.1* 8.5*  HCT 32.4* 29.3* 27.7*  WBC 16.2* 13.3* 8.9  PLT 375 347 290   No results found.  ASSESSMENT / PLAN:  Respiratory Failure s/p Tracheostomy - in setting of prolonged critical illness post STEMI / CABG Bilateral Pleural Effusions s/p PlerX placement  Tracheostomy Status  STEMI s/p CABG - complicated hospitalization with cardiogenic shock, LVAD, cardiac arrest Protein Calorie  Malnutrition  Deconditioning of Critical Illness  Acute respiratory failure s/p critical illness and cardiac surgery: Tracheostomy status: Bilateral pleural effusion likely related to critical illness, low protein and fluid shifting. COPD history: Deconditioning: severe due to chronic illness. Protein calorie malnutrition, severe, a large culprit in recurrences of pleural effusion. R/o Adrenal Insufficiency - requiring levo without titration  PLAN: Trend CT output Drain R CT on M/W/F.  L T, Th, Sat Hold further diuresis with sr cr rise, NS 50 ml/hr Levophed for MAP goal 60 Vent wean protocol as tolerated, 12/5 for goal 4-6 hours Assess cortisol, if <20 add stress steroids  Mcarthur Rossettianiel J. Tyson AliasFeinstein, MD, FACP Pgr: [phone] [facility]  10/12/2014, 10:08 AM

## 2014-10-13 ENCOUNTER — Other Ambulatory Visit (HOSPITAL_COMMUNITY): Payer: Self-pay

## 2014-10-13 LAB — BASIC METABOLIC PANEL
Anion gap: 9 (ref 5–15)
BUN: 45 mg/dL — AB (ref 6–20)
CALCIUM: 8.3 mg/dL — AB (ref 8.9–10.3)
CO2: 34 mmol/L — ABNORMAL HIGH (ref 22–32)
CREATININE: 1.42 mg/dL — AB (ref 0.61–1.24)
Chloride: 93 mmol/L — ABNORMAL LOW (ref 101–111)
GFR, EST AFRICAN AMERICAN: 56 mL/min — AB (ref >60)
GFR, EST NON AFRICAN AMERICAN: 48 mL/min — AB (ref >60)
GLUCOSE: 132 mg/dL — AB (ref 65–99)
POTASSIUM: 4.4 mmol/L (ref 3.5–5.1)
Sodium: 136 mmol/L (ref 135–145)

## 2014-10-15 LAB — BASIC METABOLIC PANEL
Anion gap: 10 (ref 5–15)
BUN: 39 mg/dL — AB (ref 6–20)
CO2: 29 mmol/L (ref 22–32)
CREATININE: 1.23 mg/dL (ref 0.61–1.24)
Calcium: 8.4 mg/dL — ABNORMAL LOW (ref 8.9–10.3)
Chloride: 98 mmol/L — ABNORMAL LOW (ref 101–111)
GFR calc Af Amer: >60 mL/min (ref >60)
GFR calc non Af Amer: 57 mL/min — ABNORMAL LOW (ref >60)
Glucose, Bld: 115 mg/dL — ABNORMAL HIGH (ref 65–99)
Potassium: 4.5 mmol/L (ref 3.5–5.1)
Sodium: 137 mmol/L (ref 135–145)

## 2014-10-15 LAB — CBC
HCT: 26.7 % — ABNORMAL LOW (ref 39.0–52.0)
HEMOGLOBIN: 8.4 g/dL — AB (ref 13.0–17.0)
MCH: 30.9 pg (ref 26.0–34.0)
MCHC: 31.5 g/dL (ref 30.0–36.0)
MCV: 98.2 fL (ref 78.0–100.0)
Platelets: 267 10*3/uL (ref 150–400)
RBC: 2.72 MIL/uL — AB (ref 4.22–5.81)
RDW: 19.2 % — ABNORMAL HIGH (ref 11.5–15.5)
WBC: 9.7 10*3/uL (ref 4.0–10.5)

## 2014-10-16 DIAGNOSIS — R5381 Other malaise: Secondary | ICD-10-CM

## 2014-10-16 DIAGNOSIS — J9620 Acute and chronic respiratory failure, unspecified whether with hypoxia or hypercapnia: Secondary | ICD-10-CM

## 2014-10-16 DIAGNOSIS — I5022 Chronic systolic (congestive) heart failure: Secondary | ICD-10-CM

## 2014-10-16 DIAGNOSIS — R579 Shock, unspecified: Secondary | ICD-10-CM

## 2014-10-16 LAB — CORTISOL: CORTISOL PLASMA: 10.2 ug/dL

## 2014-10-16 NOTE — Progress Notes (Addendum)
Name: [name] MRN: [id number] DOB: 1940-08-26    ADMISSION DATE:  10/05/2014 CONSULTATION DATE:  10/06/14  REFERRING MD :  Dr. Sharyon MedicusHijazi   CHIEF COMPLAINT:  Acute Respiratory Failure, Bilateral Pleural Effusions s/p bilateral PleurX   BRIEF PATIENT DESCRIPTION: 74 y/o M with recent prolonged admission for STEMI s/p PCI, CABG with subsequent cardiogenic shock.  He is s/p trach/PEG placement.  [facility].    SIGNIFICANT EVENTS  4/13 - 5/5  [facility], s/p PCI, CABG x3, LVAD placement, trach/PEG, hemolysis, bilateral pleurx  5/06  CXR >> lines/tubs in good position, mild increase in pulm venous congestion, bibasilar atx 5/12  Tolerating PSV 12/5. : RT reports pt tolerating weaning 12/5.  Sr Cr rise noted, appears dry   SUBJECTIVE/OVERNIGHT/INTERVAL HX Per RT - yesterday did only 12h of PSV instead of goal 16h.  Currently on PSV. RT says anxiety precluding full wean. RT says no motivation to do PT he is on levophed.   VITAL SIGNS: 10/16/2014 112/57, NSR - 85 HR, RR 18, Pulse ox 100%   PHYSICAL EXAMINATION: General:  Chronically ill appearing adult male in NAD on vent  Neuro:  Awakens, alert, cooperative, moves all ext HEENT:  #7.0 Shiley cuffed trach midline Cardiovascular:  s1s2 distant, irregular Lungs: even/non-labored, lungs bilaterally clear Abdomen:  NTND, bsx4 active, +flatus Musculoskeletal:  No acute deformities  Skin:  Warm/dry, no edema, appears dry    PULMONARY No results for input(s): PHART, PCO2ART, PO2ART, HCO3, TCO2, O2SAT in the last 168 hours.  Invalid input(s): PCO2, PO2  CBC  Recent Labs Lab 10/12/14 0545 10/15/14 1154  HGB 8.5* 8.4*  HCT 27.7* 26.7*  WBC 8.9 9.7  PLT 290 267    COAGULATION No results for input(s): INR in the last 168 hours.  CARDIAC  No results for input(s): TROPONINI in the last 168 hours. No results for input(s): PROBNP in the last 168 hours.   CHEMISTRY  Recent  Labs Lab 10/10/14 0650 10/12/14 0545 10/13/14 0646 10/15/14 1154  NA 147* 138 136 137  K 3.8 4.3 4.4 4.5  CL 102 93* 93* 98*  CO2 32 36* 34* 29  GLUCOSE 160* 129* 132* 115*  BUN 49* 47* 45* 39*  CREATININE 0.67 1.54* 1.42* 1.23  CALCIUM 8.1* 8.2* 8.3* 8.4*   Estimated Creatinine Clearance: 51.5 mL/min (by C-G formula based on Cr of 1.23).   LIVER No results for input(s): AST, ALT, ALKPHOS, BILITOT, PROT, ALBUMIN, INR in the last 168 hours.   INFECTIOUS No results for input(s): LATICACIDVEN, PROCALCITON in the last 168 hours.   ENDOCRINE CBG (last 3)  No results for input(s): GLUCAP in the last 72 hours.       IMAGING x48h  -  No results found.     ASSESSMENT / PLAN: #Acuute on Chronic Respiratory Failure (baseline COPD)  s/p Tracheostomy - in setting of prolonged critical illness post STEMI / CABG  complicated hospitalization with cardiogenic shock, LVAD, cardiac arrest - making slow progress to unchanged  #Bilateral Pleural Effusions s/p PlerX placement .  Unchanged  #Deconditioning of Critical Illness - no motivatinio. Unchanged  #Circulatory shock - still on levophed. Unchanged   PLAN: Trend CT output: Drain R CT on M/W/F.  L T, Th, Sat SBT per protocol for vent Levophed for MAP goal 60 - check cortisol 10/16/2014 (was not check 10/12/14) and based on result consider steroid Continue PT support   Dr. Carmin MuskratMurali  Marchelle Gearingamaswamy, M.D., F.C.C.P Pulmonary and Critical Care Medicine Staff Physician [facility] [place] [facility] Pager: [phone], If no answer or between  15:00h - 7:00h: call [phone]  10/16/2014 12:00 PM

## 2014-10-18 LAB — BASIC METABOLIC PANEL
Anion gap: 9 (ref 5–15)
BUN: 41 mg/dL — AB (ref 6–20)
CO2: 30 mmol/L (ref 22–32)
CREATININE: 1.13 mg/dL (ref 0.61–1.24)
Calcium: 8.7 mg/dL — ABNORMAL LOW (ref 8.9–10.3)
Chloride: 99 mmol/L — ABNORMAL LOW (ref 101–111)
Glucose, Bld: 142 mg/dL — ABNORMAL HIGH (ref 65–99)
POTASSIUM: 4.4 mmol/L (ref 3.5–5.1)
SODIUM: 138 mmol/L (ref 135–145)

## 2014-10-18 LAB — CBC
HEMATOCRIT: 27.6 % — AB (ref 39.0–52.0)
HEMOGLOBIN: 8.5 g/dL — AB (ref 13.0–17.0)
MCH: 30.7 pg (ref 26.0–34.0)
MCHC: 30.8 g/dL (ref 30.0–36.0)
MCV: 99.6 fL (ref 78.0–100.0)
Platelets: 335 10*3/uL (ref 150–400)
RBC: 2.77 MIL/uL — ABNORMAL LOW (ref 4.22–5.81)
RDW: 19.1 % — ABNORMAL HIGH (ref 11.5–15.5)
WBC: 11.1 10*3/uL — ABNORMAL HIGH (ref 4.0–10.5)

## 2014-10-19 ENCOUNTER — Other Ambulatory Visit (HOSPITAL_COMMUNITY): Payer: Self-pay

## 2014-10-19 NOTE — Progress Notes (Addendum)
Name: Justin MattocksDennis W Hageman MRN: [id number] DOB: November 13, 1940    ADMISSION DATE:  10/05/2014 CONSULTATION DATE:  10/06/14  REFERRING MD :  Dr. Sharyon MedicusHijazi   CHIEF COMPLAINT:  Acute Respiratory Failure, Bilateral Pleural Effusions s/p bilateral PleurX   BRIEF PATIENT DESCRIPTION: 74 y/o M with recent prolonged admission for STEMI s/p PCI, CABG with subsequent cardiogenic shock.  He is s/p trach/PEG placement.  [facility] / [facility] efforts.    SIGNIFICANT EVENTS  4/13 - 5/5  [facility], s/p PCI, CABG x3, LVAD placement, trach/PEG, hemolysis, bilateral pleurx  5/06  CXR >> lines/tubs in good position, mild increase in pulm venous congestion, bibasilar atx 5/12  Tolerating PSV 12/5. : RT reports pt tolerating weaning 12/5.  Sr Cr rise noted, appears dry  10/16/14: Per RT - yesterday did only 12h of PSV instead of goal 16h.  Currently on PSV. RT says anxiety precluding full wean. RT says no motivation to do PT he is on levophed.    SUBJECTIVE/OVERNIGHT/INTERVAL HX 10/19/2014: Off levophed. Alternate day draining of left  - TTS and right is on MWF. Yesterday 250cc drained from right and day before that left 400cc- amber color per RN This is significantly improved since [facility] 10/05/2014 .    Major anxiety with chronic pain - PSV limited to 12h unable to do that. Dr Welton FlakesKhan pain MD will see him next week. [facility] will start fentanyl patch.   PT says they were able to sit him edge of bed 7 minutes  Hx from RT, RN, pharma and other multidisciplinary team on rounds   VITAL SIGNS: 10/19/2014 : 142/81, HR 104, pulse ox 100%, RR 23, Temp 97  PHYSICAL EXAMINATION: General:  Chronically ill appearing adult male in NAD on vent  Neuro:  Awakens, alert, cooperative, moves all ext HEENT:  #7.0 Shiley XLT cuffed trach midline Cardiovascular:  s1s2 distant, irregular Lungs: even/non-labored, lungs bilaterally clear Abdomen:  NTND, bsx4 active, +flatus Musculoskeletal:  No  acute deformities  Skin:  Warm/dry, no edema, appears dry    PULMONARY No results for input(s): PHART, PCO2ART, PO2ART, HCO3, TCO2, O2SAT in the last 168 hours.  Invalid input(s): PCO2, PO2  CBC  Recent Labs Lab 10/15/14 1154 10/18/14 0521  HGB 8.4* 8.5*  HCT 26.7* 27.6*  WBC 9.7 11.1*  PLT 267 335    COAGULATION No results for input(s): INR in the last 168 hours.  CARDIAC  No results for input(s): TROPONINI in the last 168 hours. No results for input(s): PROBNP in the last 168 hours.   CHEMISTRY  Recent Labs Lab 10/13/14 0646 10/15/14 1154 10/18/14 0521  NA 136 137 138  K 4.4 4.5 4.4  CL 93* 98* 99*  CO2 34* 29 30  GLUCOSE 132* 115* 142*  BUN 45* 39* 41*  CREATININE 1.42* 1.23 1.13  CALCIUM 8.3* 8.4* 8.7*   CrCl cannot be calculated (Unknown ideal weight.).   LIVER No results for input(s): AST, ALT, ALKPHOS, BILITOT, PROT, ALBUMIN, INR in the last 168 hours.   INFECTIOUS No results for input(s): LATICACIDVEN, PROCALCITON in the last 168 hours.   ENDOCRINE CBG (last 3)  No results for input(s): GLUCAP in the last 72 hours.       IMAGING x48h  -  Dg Chest Port 1 View  10/19/2014   CLINICAL DATA:  CHF  EXAM: PORTABLE CHEST - 1 VIEW  COMPARISON:  10/13/2014  FINDINGS: Cardiac shadow is stable. Postoperative changes  are again seen. PleurX catheters are identified bilaterally. No pneumothorax is noted. No sizable effusion is seen. Some persistent left basilar atelectasis is noted. Improved aeration in the right base is noted. A right-sided PICC line is noted in satisfactory position. Tracheostomy tube and nasogastric catheter are also noted in satisfactory position.  IMPRESSION: Stable tubes and lines as described above.  Persistent left basilar changes.   Electronically Signed   By: Alcide CleverMark  Lukens M.D.   On: 10/19/2014 07:34   Personally viewed image of cxr    ASSESSMENT / PLAN: #Acute on Chronic Respiratory Failure (baseline COPD)  s/p Tracheostomy  - in setting of prolonged critical illness post STEMI / CABG  complicated hospitalization with cardiogenic shock, LVAD, cardiac arrest     - making slow progress to unchanged and deconditioning with anxiety/pain hindering progress  #Bilateral Pleural Effusions s/p PlerX placement .  Improved slowly since admit to [facility]. Likely post op transudate   #Deconditioning of Critical Illness - no motivation. Per PT: able to sit 7 minutes and this is encouraging. His motivation might be an issue  #Circulatory shock - . On hydrocort since 10/17/14. Helped come off levophed 10/18/14.  PLAN: SBT per our pulmonary protocol for vent  - anxiety and pain control paramount. [facility] and Dr Welton FlakesKhan to address to help off the vent  Pl effusion: improved. Check BNP and decide on dc chest tubes  Shock: reduce hydrocort to qday and then decide on full dc. Take dcision on dc midodrine next few to several days  Deconditioning: Continue PT support   Dr. Kalman ShanMurali Montrey Buist, M.D., [facility] Inst[facility].C.C.P Pulmonary and Critical Care Medicine Staff Physician [facility] [place] [facility] Pager: [phone], If no answer or between  15:00h - 7:00h: call [phone]  10/19/2014 10:44 AM

## 2014-10-20 LAB — BRAIN NATRIURETIC PEPTIDE: B Natriuretic Peptide: 768.8 pg/mL — ABNORMAL HIGH (ref 0.0–100.0)

## 2014-10-23 DIAGNOSIS — J9621 Acute and chronic respiratory failure with hypoxia: Secondary | ICD-10-CM

## 2014-10-23 DIAGNOSIS — Z930 Tracheostomy status: Secondary | ICD-10-CM

## 2014-10-23 DIAGNOSIS — I9589 Other hypotension: Secondary | ICD-10-CM

## 2014-10-23 LAB — BRAIN NATRIURETIC PEPTIDE: B NATRIURETIC PEPTIDE 5: 494.9 pg/mL — AB (ref 0.0–100.0)

## 2014-10-23 NOTE — Progress Notes (Signed)
   Name: Shelby MattocksDennis W Kosar MRN: [id number] DOB: 1941-05-02    ADMISSION DATE:  10/05/2014 CONSULTATION DATE:  10/06/14  REFERRING MD :  Dr. Sharyon MedicusHijazi   CHIEF COMPLAINT:  Acute Respiratory Failure, Bilateral Pleural Effusions s/p bilateral PleurX   BRIEF PATIENT DESCRIPTION: 74 y/o M with recent prolonged admission for STEMI s/p PCI, CABG with subsequent cardiogenic shock.  He is s/p trach/PEG placement.  [facility].      SUBJECTIVE/OVERNIGHT/INTERVAL HX Comfortable on ATC  VITAL SIGNS: 10/19/2014 : 142/81, HR 104, pulse ox 100%, RR 23, Temp 97  PHYSICAL EXAMINATION: General: NAD  Neuro: No focal deficits HEENT: WNL Cardiovascular: No M Lungs: clear anteriorly Abdomen: NABS, soft Ext: warm, no edema    PULMONARY No results for input(s): PHART, PCO2ART, PO2ART, HCO3, TCO2, O2SAT in the last 168 hours.  Invalid input(s): PCO2, PO2  CBC  Recent Labs Lab 10/18/14 0521  HGB 8.5*  HCT 27.6*  WBC 11.1*  PLT 335    COAGULATION No results for input(s): INR in the last 168 hours.  CARDIAC  No results for input(s): TROPONINI in the last 168 hours. No results for input(s): PROBNP in the last 168 hours.   CHEMISTRY  Recent Labs Lab 10/18/14 0521  NA 138  K 4.4  CL 99*  CO2 30  GLUCOSE 142*  BUN 41*  CREATININE 1.13  CALCIUM 8.7*   CrCl cannot be calculated (Unknown ideal weight.).   LIVER No results for input(s): AST, ALT, ALKPHOS, BILITOT, PROT, ALBUMIN, INR in the last 168 hours.   INFECTIOUS No results for input(s): LATICACIDVEN, PROCALCITON in the last 168 hours.   ENDOCRINE CBG (last 3)  No results for input(s): GLUCAP in the last 72 hours.   CXR: NNF    ASSESSMENT Acute on Chronic Respiratory Failure (baseline COPD)    Tracheostomy status- in setting of prolonged critical illness post  Recent STEMI / CABG with prolonged hospitalization   Bilateral Pleural Effusions s/p PleurX  placement Deconditioning Hypotension, low cortisol - improved on HC  PLAN: Cont weaning in PSV > ATC as tolerated Recheck CXR later in week and consider removal of PleurX catheters Cont PT Rest of med mgmt per primary service   Billy Fischeravid Romelia Bromell, MD ; [facility] Mobile [phone].  After 5:30 PM or weekends, call [phone]  10/23/2014 2:41 PM

## 2014-10-23 NOTE — Progress Notes (Signed)
Advanced Heart Failure Rounding Note   Subjective:     Off levophed for 2 days. SBP 85-95. Remains on vent. Asking for ice swabs  Objective:    Intake/Output:  No intake or output data in the 24 hours ending 10/23/14 1435   Physical Exam: General:  Chronically ill appearing On trach  awake HEENT: normal Neck: + trach  Cor:  Regular rate & rhythm. Lungs:decreased BS Abdomen: soft, nontender, nondistended. No hepatosplenomegaly. No bruits or masses. Good bowel sounds. Extremities: no cyanosis, clubbing, rash, cachetic no edema into thighs Neuro: alert communicative   Labs: Basic Metabolic Panel:  Recent Labs Lab 10/18/14 0521  NA 138  K 4.4  CL 99*  CO2 30  GLUCOSE 142*  BUN 41*  CREATININE 1.13  CALCIUM 8.7*    Liver Function Tests: No results for input(s): AST, ALT, ALKPHOS, BILITOT, PROT, ALBUMIN in the last 168 hours. No results for input(s): LIPASE, AMYLASE in the last 168 hours. No results for input(s): AMMONIA in the last 168 hours.  CBC:  Recent Labs Lab 10/18/14 0521  WBC 11.1*  HGB 8.5*  HCT 27.6*  MCV 99.6  PLT 335    Cardiac Enzymes: No results for input(s): CKTOTAL, CKMB, CKMBINDEX, TROPONINI in the last 168 hours.  BNP: BNP (last 3 results)  Recent Labs  09/13/14 1310 10/20/14 0600 10/23/14 1142  BNP 96.8 768.8* 494.9*    ProBNP (last 3 results) No results for input(s): PROBNP in the last 8760 hours.    Other results:  Imaging: No results found.   Assessment:   1. Chronic diastolic HF 2. Acute anterolateral STEMI    --Emergent CABG 4/13 3. Chronic respiratory failure   --s/p trach 4/26 4. PAF in NSR on amio 5. Severe protein calorie malnutrition 6. Hypotension  Plan/Discussion:    Hypotension has improved. Now off levophed. Would continue midodrine and wean as tolerated. Volume status looks ok.   Agree with current plan. We will sign off. Please reconsult with questions.  Length of Stay:  Arvilla MeresBensimhon, Lowanda Cashaw  MD  10/23/2014, 2:35 PM  Advanced Heart Failure Team Pager [phone] (M-F; 7a - 4p)  Please contact [facility] for night-coverage after hours (4p -7a ) and weekends on [email or web address]

## 2014-10-25 ENCOUNTER — Other Ambulatory Visit (HOSPITAL_COMMUNITY): Payer: Self-pay

## 2014-10-26 LAB — BASIC METABOLIC PANEL
ANION GAP: 8 (ref 5–15)
BUN: 45 mg/dL — ABNORMAL HIGH (ref 6–20)
CHLORIDE: 103 mmol/L (ref 101–111)
CO2: 32 mmol/L (ref 22–32)
Calcium: 8.3 mg/dL — ABNORMAL LOW (ref 8.9–10.3)
Creatinine, Ser: 0.87 mg/dL (ref 0.61–1.24)
GFR calc Af Amer: >60 mL/min (ref >60)
Glucose, Bld: 174 mg/dL — ABNORMAL HIGH (ref 65–99)
Potassium: 3.8 mmol/L (ref 3.5–5.1)
Sodium: 143 mmol/L (ref 135–145)

## 2014-10-26 LAB — CBC
HCT: 25.9 % — ABNORMAL LOW (ref 39.0–52.0)
Hemoglobin: 8 g/dL — ABNORMAL LOW (ref 13.0–17.0)
MCH: 32.1 pg (ref 26.0–34.0)
MCHC: 30.9 g/dL (ref 30.0–36.0)
MCV: 104 fL — AB (ref 78.0–100.0)
Platelets: 232 10*3/uL (ref 150–400)
RBC: 2.49 MIL/uL — AB (ref 4.22–5.81)
RDW: 20.3 % — AB (ref 11.5–15.5)
WBC: 9.1 10*3/uL (ref 4.0–10.5)

## 2014-10-27 ENCOUNTER — Other Ambulatory Visit (HOSPITAL_COMMUNITY): Payer: Self-pay

## 2014-10-27 LAB — BASIC METABOLIC PANEL
ANION GAP: 9 (ref 5–15)
BUN: 45 mg/dL — ABNORMAL HIGH (ref 6–20)
CO2: 36 mmol/L — ABNORMAL HIGH (ref 22–32)
CREATININE: 0.99 mg/dL (ref 0.61–1.24)
Calcium: 8.6 mg/dL — ABNORMAL LOW (ref 8.9–10.3)
Chloride: 98 mmol/L — ABNORMAL LOW (ref 101–111)
Glucose, Bld: 166 mg/dL — ABNORMAL HIGH (ref 65–99)
POTASSIUM: 3.8 mmol/L (ref 3.5–5.1)
Sodium: 143 mmol/L (ref 135–145)

## 2014-10-27 NOTE — Progress Notes (Signed)
   Name: [name] MRN: [id number] DOB: 1941/03/14    ADMISSION DATE:  10/05/2014 CONSULTATION DATE:  10/06/14  REFERRING MD :  Dr. Sharyon Knight   CHIEF COMPLAINT:  Acute Respiratory Failure, Bilateral Pleural Effusions s/p bilateral PleurX   BRIEF PATIENT DESCRIPTION: 74 y/o M with recent prolonged admission for STEMI s/p PCI, CABG with subsequent cardiogenic shock.  He is s/p trach/PEG placement.  [facility] / rehab efforts.      SUBJECTIVE/OVERNIGHT/INTERVAL HX Comfortable on ATC  VITAL SIGNS: 97.6 hr 100 rr 20, sats 99% room air PHYSICAL EXAMINATION: General: NAD  Neuro: No focal deficits HEENT: WNL Cardiovascular: No M Lungs: clear anteriorly, decreased bilat bases Abdomen: NABS, soft Ext: warm, no edema    PULMONARY No results for input(s): PHART, PCO2ART, PO2ART, HCO3, TCO2, O2SAT in the last 168 hours.  Invalid input(s): PCO2, PO2  CBC  Recent Labs Lab 10/26/14 0607  HGB 8.0*  HCT 25.9*  WBC 9.1  PLT 232    COAGULATION No results for input(s): INR in the last 168 hours.  CARDIAC  No results for input(s): TROPONINI in the last 168 hours. No results for input(s): PROBNP in the last 168 hours.   CHEMISTRY  Recent Labs Lab 10/26/14 0607 10/27/14 0727  NA 143 143  K 3.8 3.8  CL 103 98*  CO2 32 36*  GLUCOSE 174* 166*  BUN 45* 45*  CREATININE 0.87 0.99  CALCIUM 8.3* 8.6*   CrCl cannot be calculated (Unknown ideal weight.).   LIVER No results for input(s): AST, ALT, ALKPHOS, BILITOT, PROT, ALBUMIN, INR in the last 168 hours.   INFECTIOUS No results for input(s): LATICACIDVEN, PROCALCITON in the last 168 hours.   ENDOCRINE CBG (last 3)  No results for input(s): GLUCAP in the last 72 hours.   CXR: NNF    ASSESSMENT Acute on Chronic Respiratory Failure (baseline COPD)    Tracheostomy status- in setting of prolonged critical illness post  Recent STEMI / CABG with prolonged hospitalization   Bilateral Pleural  Effusions s/p PleurX placement Deconditioning Hypotension, low cortisol - improved on HC  PCXR w/ recurrent effusion. Drained them today.  Left w/ only 200 ml over last 4 days, right had 500 ml, but drainage had to be stopped d/t pain.   PLAN: Cont weaning in PSV > ATC as tolerated Remove Left Pleur x cath Recheck CXR 5/31 and then drain left tube. If still ~ 500 output will keep CT in.  Cont PT Rest of med mgmt per primary service  Justin MartinetPeter E Zarif Knight ACNP-BC [facility] Pager # [phone] OR # [phone] if no answer   10/27/2014 1:20 PM

## 2014-10-31 ENCOUNTER — Other Ambulatory Visit (HOSPITAL_COMMUNITY): Payer: Self-pay

## 2014-10-31 ENCOUNTER — Institutional Professional Consult (permissible substitution) (HOSPITAL_COMMUNITY): Payer: Self-pay

## 2014-10-31 ENCOUNTER — Encounter (HOSPITAL_COMMUNITY): Payer: Self-pay | Admitting: Cardiothoracic Surgery

## 2014-10-31 DIAGNOSIS — I5033 Acute on chronic diastolic (congestive) heart failure: Secondary | ICD-10-CM

## 2014-10-31 DIAGNOSIS — J9690 Respiratory failure, unspecified, unspecified whether with hypoxia or hypercapnia: Secondary | ICD-10-CM | POA: Insufficient documentation

## 2014-10-31 DIAGNOSIS — J9600 Acute respiratory failure, unspecified whether with hypoxia or hypercapnia: Secondary | ICD-10-CM

## 2014-10-31 NOTE — Progress Notes (Signed)
   Name: [name] MRN: [id number] DOB: 1940/06/19    ADMISSION DATE:  10/05/2014 CONSULTATION DATE:  10/06/14  REFERRING MD :  Dr. Sharyon MedicusHijazi   CHIEF COMPLAINT:  Acute Respiratory Failure, Bilateral Pleural Effusions s/p bilateral PleurX   BRIEF PATIENT DESCRIPTION: 74 y/o M with recent prolonged admission for STEMI s/p PCI, CABG with subsequent cardiogenic shock.  He is s/p trach/PEG placement.  [facility] efforts.     SUBJECTIVE/OVERNIGHT/INTERVAL HX Elevated rr on TC  VITAL SIGNS: 97.6 hr 90 rr 24, sats 99% collar O@ PHYSICAL EXAMINATION: General: mild distress on collar Neuro: No focal deficits HEENT: trach in place Cardiovascular:  s1 s2 RRR Lungs: reduced bases Abdomen: NABS, soft Ext: warm, no edema    PULMONARY No results for input(s): PHART, PCO2ART, PO2ART, HCO3, TCO2, O2SAT in the last 168 hours.  Invalid input(s): PCO2, PO2  CBC  Recent Labs Lab 10/26/14 0607  HGB 8.0*  HCT 25.9*  WBC 9.1  PLT 232    COAGULATION No results for input(s): INR in the last 168 hours.  CARDIAC  No results for input(s): TROPONINI in the last 168 hours. No results for input(s): PROBNP in the last 168 hours.   CHEMISTRY  Recent Labs Lab 10/26/14 0607 10/27/14 0727  NA 143 143  K 3.8 3.8  CL 103 98*  CO2 32 36*  GLUCOSE 174* 166*  BUN 45* 45*  CREATININE 0.87 0.99  CALCIUM 8.3* 8.6*   CrCl cannot be calculated (Unknown ideal weight.).   LIVER No results for input(s): AST, ALT, ALKPHOS, BILITOT, PROT, ALBUMIN, INR in the last 168 hours.   INFECTIOUS No results for input(s): LATICACIDVEN, PROCALCITON in the last 168 hours.   ENDOCRINE CBG (last 3)  No results for input(s): GLUCAP in the last 72 hours.   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 Will bronch , assess location and exit of trach currently to assess safety to use reg or extra long with iner cannula May need CT neck After bronch assessment will change trach   Mcarthur Rossettianiel J. Tyson AliasFeinstein, MD, FACP Pgr: [phone] [place] [facility]

## 2014-10-31 NOTE — Procedures (Signed)
Bronchoscopy Procedure Note Shelby MattocksDennis W Bordonaro [id number] 1940/06/25  Procedure: Bronchoscopy Indications: Diagnostic evaluation of the airways, Obtain specimens for culture and/or other diagnostic studies and Remove secretions Failure to wean, r/o mucous plugging, assess need extra long trach , has long long trach that needs to be changed Procedure Details Consent: Risks of procedure as well as the alternatives and risks of each were explained to the (patient/caregiver).  Consent for procedure obtained. Time Out: Verified patient identification, verified procedure, site/side was marked, verified correct patient position, special equipment/implants available, medications/allergies/relevent history reviewed, required imaging and test results available.  Performed  In preparation for procedure, patient was given 100% FiO2 and bronchoscope lubricated. Sedation: Etomidate  Airway entered and the following bronchi were examined: RUL, RML, RLL, LUL, LLL and Bronchi.   Procedures performed: Brushings - NO Bronchoscope removed.  , Patient placed back on 100% FiO2 at conclusion of procedure.    Evaluation Hemodynamic Status: BP stable throughout; O2 sats: stable throughout Patient's Current Condition: stable Specimens:  Sent purulent fluid Complications: No apparent complications Patient did tolerate procedure well.   Nelda BucksFEINSTEIN,Panayiotis [place] J. 10/31/2014   1. Janina Mayorach exits and has a long distance to [place], concerning me to place a reg trach length 2. R/o cricothyroidotomy as initial trach, unlikely (may need CT neck) 3. BAL rml, purulent? Tan tube feed color 4. Moderate distal plugs throughout - contributor to weaning likely   BAl grows bacteremia would treat  Mcarthur Rossettianiel J. Tyson AliasFeinstein, MD, FACP Pgr: [phone] [facility]

## 2014-10-31 NOTE — Procedures (Signed)
Trach change  Bronch revealed need extra long  Bouche placed did not meet any resistance Pushed about 7 cm past trach exit likey  Removed old trach Placed NEW extra long 6 , easily  Removed bouche Blood noted Re bronch 1. Blood loss acute from BI, clotted on own 2. Cleaned out spill over to RUL and LEft  No epi required  pcxr asked stat sats 100% throughout  Justin RossettiDaniel J. Knight AliasFeinstein, MD, FACP Pgr: [phone] [place] [facility]

## 2014-10-31 NOTE — Progress Notes (Signed)
   Name: Justin MattocksDennis W Knight MRN: [id number] DOB: Sep 08, 1940    ADMISSION DATE:  10/05/2014 CONSULTATION DATE:  10/06/14  REFERRING MD :  Dr. Sharyon MedicusHijazi   CHIEF COMPLAINT:  Acute Respiratory Failure, Bilateral Pleural Effusions s/p bilateral PleurX   BRIEF PATIENT DESCRIPTION: 74 y/o M with recent prolonged admission for STEMI s/p PCI, CABG with subsequent cardiogenic shock.  He is s/p trach/PEG placement.  [facility] / [facility] efforts.      SUBJECTIVE/OVERNIGHT/INTERVAL HX Comfortable on ATC  VITAL SIGNS: 10/19/2014 : 142/81, HR 104, pulse ox 100%, RR 23, Temp 97  PHYSICAL EXAMINATION: General: NAD  Neuro: No focal deficits HEENT: WNL Cardiovascular: No M Lungs: clear anteriorly Abdomen: NABS, soft Ext: warm, no edema    PULMONARY No results for input(s): PHART, PCO2ART, PO2ART, HCO3, TCO2, O2SAT in the last 168 hours.  Invalid input(s): PCO2, PO2  CBC  Recent Labs Lab 10/26/14 0607  HGB 8.0*  HCT 25.9*  WBC 9.1  PLT 232    COAGULATION No results for input(s): INR in the last 168 hours.  CARDIAC  No results for input(s): TROPONINI in the last 168 hours. No results for input(s): PROBNP in the last 168 hours.   CHEMISTRY  Recent Labs Lab 10/26/14 0607 10/27/14 0727  NA 143 143  K 3.8 3.8  CL 103 98*  CO2 32 36*  GLUCOSE 174* 166*  BUN 45* 45*  CREATININE 0.87 0.99  CALCIUM 8.3* 8.6*   CrCl cannot be calculated (Unknown ideal weight.).   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  Deconditioning Hypotension, low cortisol - improved on HC  >removed left CT on 5/27. Now   PLAN: Cont weaning in PSV > ATC as tolerated Recheck CXR later in week and consider removal of PleurX catheters Cont PT Rest of med mgmt per primary  service   10/31/2014 11:46 AM

## 2014-11-01 DIAGNOSIS — E43 Unspecified severe protein-calorie malnutrition: Secondary | ICD-10-CM

## 2014-11-01 NOTE — Progress Notes (Addendum)
Name: Justin MattocksDennis W Knight MRN: [id number] DOB: [birth year]    ADMISSION DATE:  10/05/2014 CONSULTATION DATE:  10/06/14  REFERRING MD :  Dr. Sharyon MedicusHijazi   CHIEF COMPLAINT:  Acute Respiratory Failure, Bilateral Pleural Effusions s/p bilateral PleurX   BRIEF PATIENT DESCRIPTION: 74 y/o M with recent prolonged admission for STEMI s/p PCI, CABG with subsequent cardiogenic shock.  He is s/p trach/PEG placement.  [facility].     SUBJECTIVE/OVERNIGHT/INTERVAL HX Elevated rr on TC  VITAL SIGNS: 97.6 hr 101 rr 24, sats 99% 28% 8 peep PHYSICAL EXAMINATION: General: No distress on collar Neuro: No focal deficits HEENT: trach in place, new xlong 5/31 Cardiovascular:  s1 s2 RRR Lungs: reduced bases Abdomen: NABS, soft Ext: warm, no edema    PULMONARY No results for input(s): PHART, PCO2ART, PO2ART, HCO3, TCO2, O2SAT in the last 168 hours.  Invalid input(s): PCO2, PO2  CBC  Recent Labs Lab 10/26/14 0607  HGB 8.0*  HCT 25.9*  WBC 9.1  PLT 232    COAGULATION No results for input(s): INR in the last 168 hours.  CARDIAC  No results for input(s): TROPONINI in the last 168 hours. No results for input(s): PROBNP in the last 168 hours.   CHEMISTRY  Recent Labs Lab 10/26/14 0607 10/27/14 0727  NA 143 143  K 3.8 3.8  CL 103 98*  CO2 32 36*  GLUCOSE 174* 166*  BUN 45* 45*  CREATININE 0.87 0.99  CALCIUM 8.3* 8.6*   CrCl cannot be calculated (Unknown ideal weight.).   LIVER No results for input(s): AST, ALT, ALKPHOS, BILITOT, PROT, ALBUMIN, INR in the last 168 hours.   INFECTIOUS No results for input(s): LATICACIDVEN, PROCALCITON in the last 168 hours.   ENDOCRINE CBG (last 3)  No results for input(s): GLUCAP in the last 72 hours.   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 Deconditioning Hypotension, low cortisol - improved on HC, resolved  PLAN: New trach placed 5/31 per Tyson AliasFeinstein  Continued peep of 8 for now Wean as tolerated Drain ct as needed  Brett CanalesSteve Minor ACNP [facility] Pager [phone] till 3 pm If no answer page [phone] 11/01/2014, 9:38 AM  Attending note:   74 year old man well known to our service from prolonged hospitalization in the surgical ICU presenting to [facility], trached on vent. I reviewed his CXR myself, bilateral pleurex catheters noted with bilateral pleural effusion. Discussed with NP and Dr. Annabelle HarmanHigazi ([facility]-MD).  Acute respiratory failure s/p critical illness and cardiac surgery: - Continue weaning per protocol, currently tolerating high PS, recommend gradually dropping PS prior to increasing time.  Tracheostomy status: - Trach in place, may remove sutures after 7 days. - Would not recommend decreasing size at this point until able to tolerate TC.  Bilateral pleural effusion likely related to critical illness, low protein and fluid shifting. - Drain every other day but not more than a liter at a time. - Every other day CXR prior to drainage. - Tabulate and measure drainage to see how much is removed, hopefully decrease over time.  COPD history: - Pulmicort as ordered. - PRN albuterol. - Combivent as ordered.  Deconditioning: severe due to chronic illness. - OOB to chair as able even with vent. - PT as ordered.  Protein calorie malnutrition, severe, a large culprit in recurrences of pleural effusion. - Nutritionist assistance with TF. - Measure pre-albumin.  Patient seen and examined, agree with above note. I dictated the care and orders written for this patient under my direction.  Alyson Reedy,  MD [phone]

## 2014-11-02 ENCOUNTER — Other Ambulatory Visit (HOSPITAL_COMMUNITY): Payer: Self-pay

## 2014-11-02 LAB — CBC
HCT: 25.8 % — ABNORMAL LOW (ref 39.0–52.0)
Hemoglobin: 8 g/dL — ABNORMAL LOW (ref 13.0–17.0)
MCH: 31.9 pg (ref 26.0–34.0)
MCHC: 31 g/dL (ref 30.0–36.0)
MCV: 102.8 fL — AB (ref 78.0–100.0)
Platelets: 176 10*3/uL (ref 150–400)
RBC: 2.51 MIL/uL — AB (ref 4.22–5.81)
RDW: 18.8 % — AB (ref 11.5–15.5)
WBC: 11.4 10*3/uL — AB (ref 4.0–10.5)

## 2014-11-02 LAB — BASIC METABOLIC PANEL
ANION GAP: 11 (ref 5–15)
BUN: 54 mg/dL — ABNORMAL HIGH (ref 6–20)
CHLORIDE: 96 mmol/L — AB (ref 101–111)
CO2: 32 mmol/L (ref 22–32)
Calcium: 7.9 mg/dL — ABNORMAL LOW (ref 8.9–10.3)
Creatinine, Ser: 1.11 mg/dL (ref 0.61–1.24)
GFR calc Af Amer: >60 mL/min (ref >60)
GFR calc non Af Amer: >60 mL/min (ref >60)
Glucose, Bld: 172 mg/dL — ABNORMAL HIGH (ref 65–99)
Potassium: 3.8 mmol/L (ref 3.5–5.1)
Sodium: 139 mmol/L (ref 135–145)

## 2014-11-03 DIAGNOSIS — E46 Unspecified protein-calorie malnutrition: Secondary | ICD-10-CM | POA: Insufficient documentation

## 2014-11-03 LAB — CULTURE, RESPIRATORY W GRAM STAIN

## 2014-11-03 LAB — CULTURE, RESPIRATORY

## 2014-11-03 NOTE — Consult Note (Signed)
Chief Complaint: Malnutrition Dysphagia Deconditioning   Referring Physician(s): Hijazi  History of Present Illness: [name] is a 74 y.o. male   CAD/MI CABG CHF/Pleural effusions--B PleurX- placed Dr Zenaida Niece Maudie Flakes 10/05/14 Trach now Deconditioning; protein calorie malnutrition Dysphagia Request for percutaneous gastric tube placement CT reviewed with Dr Sharlot Gowda procedure---may need barium enema Scheduled now for 6/6 in IR  Past Medical History  Diagnosis Date  . Dyspnea   . Palpitations   . Anxiety and depression   . GERD (gastroesophageal reflux disease)   . Hypertension   . Hypercholesteremia     Past Surgical History  Procedure Laterality Date  . Appendectomy    . Cataract extraction w/phaco  05/06/2012    Procedure: CATARACT EXTRACTION PHACO AND INTRAOCULAR LENS PLACEMENT (IOC);  Surgeon: Gemma Payor, MD;  Location: [facility];  Service: Ophthalmology;  Laterality: Right;  CDE:22.64  . Cataract extraction w/phaco  05/24/2012    Procedure: CATARACT EXTRACTION PHACO AND INTRAOCULAR LENS PLACEMENT (IOC);  Surgeon: Gemma Payor, MD;  Location: [facility];  Service: Ophthalmology;  Laterality: Left;  CDE: 27.08  . Cardiac catheterization  09/13/2014    Procedure: VENTRICULAR ASSIST DEVICE INSERTION;  Surgeon: Corky Crafts, MD;  Location: [facility] CATH LAB;  Service: Cardiovascular;;  . Cardiac catheterization  09/13/2014    Procedure: CORONARY BALLOON ANGIOPLASTY;  Surgeon: Corky Crafts, MD;  Location: [facility] CATH LAB;  Service: Cardiovascular;;  . Coronary angiogram  09/13/2014    Procedure: CORONARY ANGIOGRAM;  Surgeon: Corky Crafts, MD;  Location: [facility] CATH LAB;  Service: Cardiovascular;;  . Coronary artery bypass graft N/A 09/13/2014    Procedure: CORONARY ARTERY BYPASS GRAFTING (CABG), ON PUMP, TIMES THREE, USING LEFT INTERNAL MAMMARY ARTERY, LEFT GREATER SAPHENOUS VEIN HARVESTED ENDOSCOPICALLY;  Surgeon: Kerin Perna, MD;  Location: [facility] OR;  Service: Open  Heart Surgery;  Laterality: N/A;  . Patent foramen ovale closure  09/13/2014    Procedure: PATENT FORAMEN OVALE CLOSURE;  Surgeon: Kerin Perna, MD;  Location: [facility] OR;  Service: Open Heart Surgery;;  . Chest tube insertion Bilateral 10/05/2014    Procedure: INSERTION PLEURAL DRAINAGE CATHETER;  Surgeon: Kerin Perna, MD;  Location: [facility] OR;  Service: Thoracic;  Laterality: Bilateral;  . Tracheostomy tube placement N/A 09/26/2014    Procedure: TRACHEOSTOMY;  Surgeon: Kerin Perna, MD;  Location: [facility] OR;  Service: Thoracic;  Laterality: N/A;  . Chest tube insertion Left 09/26/2014    Procedure: CHEST TUBE INSERTION;  Surgeon: Kerin Perna, MD;  Location: [facility] OR;  Service: Thoracic;  Laterality: Left;    Allergies: Penicillins  Medications: Prior to Admission medications   Medication Sig Start Date End Date Taking? Authorizing Provider  acetaminophen (TYLENOL) 160 MG/5ML solution Place 10.2 mLs (325 mg total) into feeding tube every 4 (four) hours as needed for moderate pain. 10/05/14   Wilmon Pali, PA-C  ALPRAZolam (XANAX) 0.5 MG tablet Place 1 tablet (0.5 mg total) into feeding tube 3 (three) times daily. 10/05/14   Wilmon Pali, PA-C  Amino Acids-Protein Hydrolys (FEEDING SUPPLEMENT, PRO-STAT SUGAR FREE 64,) LIQD Place 30 mLs into feeding tube 2 (two) times daily. 10/05/14   Wilmon Pali, PA-C  amiodarone (PACERONE) 200 MG tablet Place 1 tablet (200 mg total) into feeding tube 2 (two) times daily. 10/05/14   Wilmon Pali, PA-C  antiseptic oral rinse (CPC / CETYLPYRIDINIUM CHLORIDE 0.05%) 0.05 % LIQD solution 7 mLs by Mouth Rinse route QID. 10/05/14   Gina L  Collins, PA-C  aspirin 81 MG chewable tablet Place 4 tablets (324 mg total) into feeding tube daily. 10/05/14   Wilmon Pali, PA-C  atorvastatin (LIPITOR) 40 MG tablet Place 1 tablet (40 mg total) into feeding tube daily at 6 PM. 10/05/14   Wilmon Pali, PA-C  budesonide (PULMICORT) 0.5 MG/2ML nebulizer solution Take 2 mLs (0.5 mg total)  by nebulization 2 (two) times daily. 10/05/14   Wilmon Pali, PA-C  chlorhexidine (PERIDEX) 0.12 % solution 15 mLs by Mouth Rinse route 2 (two) times daily. 10/05/14   Wilmon Pali, PA-C  digoxin (LANOXIN) 0.125 MG tablet Place 1 tablet (0.125 mg total) into feeding tube every other day. 10/05/14   Wilmon Pali, PA-C  enoxaparin (LOVENOX) 30 MG/0.3ML injection Inject 0.3 mLs (30 mg total) into the skin daily. 10/06/14   Wilmon Pali, PA-C  insulin detemir (LEVEMIR) 100 UNIT/ML injection Inject 0.15 mLs (15 Units total) into the skin 2 (two) times daily. 10/05/14   Wilmon Pali, PA-C  ipratropium-albuterol (DUONEB) 0.5-2.5 (3) MG/3ML SOLN Take 3 mLs by nebulization every 2 (two) hours as needed. 10/05/14   Wilmon Pali, PA-C  levalbuterol (XOPENEX) 0.63 MG/3ML nebulizer solution Take 3 mLs (0.63 mg total) by nebulization every 3 (three) hours as needed for wheezing or shortness of breath. 10/05/14   Wilmon Pali, PA-C  metoCLOPramide (REGLAN) 5 MG/ML injection Inject 2 mLs (10 mg total) into the vein every 12 (twelve) hours. 10/05/14   Wilmon Pali, PA-C  midodrine (PROAMATINE) 10 MG tablet Place 1 tablet (10 mg total) into feeding tube 3 (three) times daily with meals. 10/05/14   Wilmon Pali, PA-C  nicotine (NICODERM CQ - DOSED IN MG/24 HOURS) 14 mg/24hr patch Place 1 patch (14 mg total) onto the skin daily. 10/05/14   Wilmon Pali, PA-C  norepinephrine 16 mg in dextrose 5 % 250 mL Inject 0-40 mcg/min into the vein continuous. 10/05/14   Wilmon Pali, PA-C  Nutritional Supplements (FEEDING SUPPLEMENT, VITAL 1.5 CAL,) LIQD Place 1,000 mLs into feeding tube daily. 10/05/14   Wilmon Pali, PA-C  ondansetron (ZOFRAN) 4 MG/2ML SOLN injection Inject 2 mLs (4 mg total) into the vein every 6 (six) hours as needed for nausea or vomiting. 10/05/14   Wilmon Pali, PA-C  pantoprazole sodium (PROTONIX) 40 mg/20 mL PACK Place 20 mLs (40 mg total) into feeding tube daily. 10/05/14   Wilmon Pali, PA-C  QUEtiapine  (SEROQUEL) 50 MG tablet Place 1 tablet (50 mg total) into feeding tube at bedtime. 10/05/14   Wilmon Pali, PA-C  torsemide (DEMADEX) 20 MG tablet Place 1 tablet (20 mg total) into feeding tube 2 (two) times daily. 10/05/14   Wilmon Pali, PA-C     Family History  Problem Relation Age of Onset  . Coronary artery disease      History   Social History  . Marital Status: Divorced    Spouse Name: N/A  . Number of Children: N/A  . Years of Education: N/A   Occupational History  . retired    Social History Main Topics  . Smoking status: Current Every Day Smoker -- 1.00 packs/day for 40 years    Types: Cigarettes  . Smokeless tobacco: Not on file  . Alcohol Use: No  . Drug Use: Not on file  . Sexual Activity: Yes    Birth Control/ Protection: None   Other Topics Concern  . Not on file  Social History Narrative     Review of Systems: A 12 point ROS discussed and pertinent positives are indicated in the HPI above.  All other systems are negative.  Review of Systems  Constitutional: Positive for activity change and appetite change. Negative for fever.  HENT: Positive for sore throat.   Respiratory: Positive for cough and shortness of breath.   Cardiovascular: Negative for chest pain.  Neurological: Positive for weakness.  Psychiatric/Behavioral: Negative for behavioral problems and confusion.    Vital Signs: There were no vitals taken for this visit.  Physical Exam  Constitutional: He is oriented to person, place, and time. He appears well-developed.  Cardiovascular: Normal rate.   Pulmonary/Chest: Effort normal. He has wheezes.  trach  Abdominal: Soft. Bowel sounds are normal.  Musculoskeletal: Normal range of motion.  Neurological: He is alert and oriented to person, place, and time.  A/O Able to answer all questions appropriately  Psychiatric: He has a normal mood and affect. His behavior is normal. Judgment normal.  Nursing note and vitals  reviewed.   Mallampati Score:  MD Evaluation Airway: Other (comments) Airway comments: trach Heart: WNL Abdomen: WNL Chest/ Lungs: WNL ASA  Classification: 3 Mallampati/Airway Score: Three  Imaging: Ct Abdomen Wo Contrast  11/02/2014   CLINICAL DATA:  CHF, pleural effusions, deconditioned state, evaluate for percutaneous gastrostomy placement. Plain radiographs demonstrate the air distended colon high in the epigastric region.  EXAM: CT ABDOMEN WITHOUT CONTRAST  TECHNIQUE: Multidetector CT imaging of the abdomen was performed following the standard protocol without IV contrast.  COMPARISON:  10/07/2014 abdominal radiograph  FINDINGS: Lower chest: Posterior loculated bilateral pleural effusions noted with bibasilar consolidations/pneumonia. Right chest PleurX drain in place. Prior median sternotomy noted. Normal heart size. No pericardial effusion. NG tube within the stomach.  Abdomen: The stomach is posterior to the anteriorly positioned colon as well as the left hepatic lobe. There does not appear to be an adequate percutaneous window for fluoroscopic percutaneous gastrostomy. Therefore, recommend surgical consultation for operative gastrostomy.  Cholelithiasis evident. Incidental punctate calcified hepatic granuloma. No biliary dilatation. Biliary system, pancreas, spleen, accessory splenule, and adrenal glands are within normal limits for age and noncontrast imaging. Kidneys demonstrate no hydronephrosis or renal obstruction. Left kidney has a upper pole parapelvic cyst measuring 28 mm, image 33.  Diffuse flank region subcutaneous edema noted compatible with anasarca.  Negative for bowel obstruction, dilatation, ileus, or free air.  Aortic atherosclerosis noted without aneurysm.  No acute osseous finding.  IMPRESSION: Stomach is deep to the anteriorly positioned high colon as well as the left hepatic lobe. Therefore fluoroscopic percutaneous gastrostomy cannot be performed.  Bilateral loculated  pleural effusions with basilar consolidation/pneumonia, worse on the right  Right PleurX drain in good position.  Cholelithiasis  Anasarca   Electronically Signed   By: Judie Petit.  Shick M.D.   On: 11/02/2014 15:54   Dg Chest Port 1 View  10/31/2014   CLINICAL DATA:  Respiratory failure  EXAM: PORTABLE CHEST - 1 VIEW  COMPARISON:  Study obtained earlier in the day  FINDINGS: Tracheostomy catheter tip is 5.5 cm above the carina. There is a chest tube on the right. Nasogastric tube tip and side port are below the diaphragm. Central catheter tip is in the superior cava. No pneumothorax. There are bilateral pleural effusions. There is evidence suggesting underlying emphysematous change. There is consolidation medial left base, stable. No new opacity. Heart size is normal. Pulmonary vascular is normal. No adenopathy.  IMPRESSION: Tube and catheter positions as described  without pneumothorax. Small effusions bilaterally, stable. Consolidation medial left base. Underlying emphysema.   Electronically Signed   By: Bretta BangWilliam  Woodruff III M.D.   On: 10/31/2014 17:47   Dg Chest Port 1 View  10/31/2014   CLINICAL DATA:  Shortness of breath, tracheostomy, pleural effusion  EXAM: PORTABLE CHEST - 1 VIEW  COMPARISON:  Portable chest x-ray of Oct 27, 2014  FINDINGS: The lungs are mildly hyperinflated. There is an improved appearance of the left lung base but persistent subsegmental atelectasis is suspected. There are small bilateral pleural effusions. The cardiac silhouette and pulmonary vascularity are normal.  The patient has undergone previous CABG. The tracheostomy appliance tip projects at the level of the inferior margin of the clavicular heads. The esophagogastric tube tip projects below the inferior margin of the image. The right-sided PICC line tip projects over the midportion of the SVC. There are 7 intact sternal wires from previous median sternotomy.  IMPRESSION: COPD. Improved aeration of both lungs with persistent small  bilateral pleural effusions. Right lower lobe atelectasis persists as well. The support tubes and lines are in reasonable position.   Electronically Signed   By: David  SwazilandJordan M.D.   On: 10/31/2014 07:55   Dg Chest Port 1 View  10/27/2014   CLINICAL DATA:  Left-sided pleural effusion.  EXAM: PORTABLE CHEST - 1 VIEW  COMPARISON:  10/25/2014  FINDINGS: Tracheostomy tube and nasogastric tube unchanged. Right-sided PICC line with tip at the cavoatrial junction unchanged. Stable elevation of the left hemidiaphragm. Opacification over the medial left base and retrocardiac region with minimal blunting of the left costophrenic angle likely effusion with associated atelectasis although cannot exclude infection in the left base. Resolved right basilar opacification. Cardiomediastinal silhouette and remainder of the exam is unchanged.  IMPRESSION: Persistent opacification in the left base/ retrocardiac region likely small effusion with atelectasis as cannot exclude infection in the left base.  Tubes and lines as described.   Electronically Signed   By: Elberta Fortisaniel  Boyle M.D.   On: 10/27/2014 15:49   Dg Chest Port 1 View  10/25/2014   CLINICAL DATA:  Respiratory abnormality, pleural effusion  EXAM: PORTABLE CHEST - 1 VIEW  COMPARISON:  10/19/2014  FINDINGS: Evidence of CABG noted with mild central vascular congestion. Increased patchy bibasilar airspace opacities are noted with small pleural effusions. Bilateral pleural catheters are in place. Tracheostomy tube is appropriately positioned. Nasogastric tube tip terminates below the level of the hemidiaphragms but is not included in the field of view. Right-sided PICC line in place with tip over the cavoatrial junction. No pneumothorax.  IMPRESSION: Increased hazy bibasilar airspace opacities which could reflect posteriorly tracking pleural fluid, increased since previously, although edema or other alveolar filling process could appear similar.   Electronically Signed   By:  Christiana PellantGretchen  Green M.D.   On: 10/25/2014 11:06   Dg Chest Port 1 View  10/19/2014   CLINICAL DATA:  CHF  EXAM: PORTABLE CHEST - 1 VIEW  COMPARISON:  10/13/2014  FINDINGS: Cardiac shadow is stable. Postoperative changes are again seen. PleurX catheters are identified bilaterally. No pneumothorax is noted. No sizable effusion is seen. Some persistent left basilar atelectasis is noted. Improved aeration in the right base is noted. A right-sided PICC line is noted in satisfactory position. Tracheostomy tube and nasogastric catheter are also noted in satisfactory position.  IMPRESSION: Stable tubes and lines as described above.  Persistent left basilar changes.   Electronically Signed   By: Alcide CleverMark  Lukens M.D.   On: 10/19/2014  07:34   Dg Chest Port 1 View  10/13/2014   CLINICAL DATA:  Respiratory failure.  EXAM: DG C-ARM 1-60 MIN - NRPT [facility]; PORTABLE CHEST - 1 VIEW  COMPARISON:  10/09/2014.  FINDINGS: Tracheostomy tube noted in stable position. NG tube and right PICC line stable position. Bilateral chest tubes in stable position. Mediastinum hilar structures are stable. Prior CABG. Heart size stable. Bilateral pulmonary infiltrates are present. These could represent changes of bilateral pulmonary edema and/or pneumonia. Small bilateral pleural effusions.  IMPRESSION: 1. Lines and tubes in stable position including stable position of bilateral chest tubes. No pneumothorax. 2. Cardiomegaly. Prior CABG . Bilateral pulmonary infiltrates and small pleural effusions suggesting congestive heart failure. Bilateral pneumonia cannot be excluded .   Electronically Signed   By: Maisie Fus  Register   On: 10/13/2014 08:16   Dg Chest Port 1 View  10/09/2014   CLINICAL DATA:  74 year old male with pleural effusion and congestive heart failure.  EXAM: PORTABLE CHEST - 1 VIEW  COMPARISON:  Chest x-ray 10/06/2014.  FINDINGS: A tracheostomy tube is in place with tip 8.4 cm above the carina. There is a right upper extremity PICC with tip  terminating in the superior cavoatrial junction. A nasogastric tube is seen extending into the stomach, however, the tip of the nasogastric tube extends below the lower margin of the image. Small bore left-sided chest tube in position with tip projecting over the mid left hemithorax. Small bore right-sided chest tube in position with tip directed toward the medial aspect of the base of the right hemithorax. Small right and small to moderate left pleural effusions appear similar to slightly decreased compared to recent prior examinations. No pneumothorax. Pulmonary venous congestion, without frank pulmonary edema. Bibasilar opacities favored to reflect subsegmental atelectasis (left greater than right). Heart size is normal. The patient is rotated to the left on today's exam, resulting in distortion of the mediastinal contours and reduced diagnostic sensitivity and specificity for mediastinal pathology. Atherosclerosis in the thoracic aorta. Status post median sternotomy for CABG.  IMPRESSION: 1. Postoperative changes and support apparatus, as above. 2. Slight decreased size of bilateral pleural effusions (small on the right and small to moderate on the left) with bilateral PleurX catheters in position. 3. Pulmonary venous congestion, without frank pulmonary edema. 4. Probable bibasilar subsegmental atelectasis.   Electronically Signed   By: Trudie Reed M.D.   On: 10/09/2014 07:57   Dg Chest Port 1 View  10/06/2014   CLINICAL DATA:  Respiratory failure.  EXAM: PORTABLE CHEST - 1 VIEW  COMPARISON:  None.  FINDINGS: Tracheostomy to NG tube, right PICC line, bilateral chest tubes in stable position. Mediastinum hilar structures are stable. Prior CABG. Slight increase in pulmonary vascularity with diffuse bilateral pulmonary alveolar infiltrates now present on today's exam. Bilateral pleural effusions are present. These findings are consistent with congestive heart failure. Bibasilar atelectasis present. Basilar  pneumonia cannot be excluded . No pneumothorax.  IMPRESSION: 1. Right PICC line and bilateral chest tubes in stable position. No pneumothorax. 2. Prior CABG. Interim slight increase in pulmonary venous congestion. Interim appearance of bilateral pulmonary alveolar infiltrates and bilateral small pleural effusions. These findings are consistent with congestive heart failure. 3. Persistent bibasilar atelectasis. Superimposed bibasilar pneumonia cannot be excluded.   Electronically Signed   By: Maisie Fus  Register   On: 10/06/2014 07:26   Dg Chest Port 1 View  10/05/2014   CLINICAL DATA:  74 year old male status post CABG. Bilateral chest tube placement. Initial encounter. Initial encounter.  EXAM: PORTABLE CHEST - 1 VIEW  COMPARISON:  0833 hr today and earlier.  FINDINGS: Portable AP semi upright view at 1342 hrs. Stable left chest tube. No left pneumothorax. Stable right chest tube, less conspicuous than that on the left. No right pneumothorax. Continued dense retrocardiac opacity suggesting collapse or consolidation.  Stable tracheostomy tube, visible enteric tube, and right PICC line. Stable cardiac size and mediastinal contours. Sequelae of CABG. Pulmonary vascularity appears decreased without acute edema.  IMPRESSION: 1. Stable lines and tubes. No pneumothorax. Decreased pulmonary vascularity. 2. Left lower lobe collapse or consolidation.   Electronically Signed   By: Odessa Fleming M.D.   On: 10/05/2014 13:52   Dg Chest Portable 1 View  10/05/2014   CLINICAL DATA:  Postop following insertion of bilateral pleural drainage catheters.  EXAM: PORTABLE CHEST - 1 VIEW  COMPARISON:  10/03/2014  FINDINGS: Small chest tube on the right projects over the lower hemi thorax. Tip of the right chest tube projects over the mid left hemi thorax. There has been a decrease in pleural effusions, greater on the right than the left. Residual left lung base opacity obscures hemidiaphragm. Right hemidiaphragm is well-defined.  There is  central vascular congestion and mild interstitial prominence similar to the prior exam. Mild hazy opacity is noted in a perihilar distribution on the left. Mild pulmonary edema is suspected.  No pneumothorax.  Tracheostomy tube, enteric feeding tube and right-sided PICC is stable.  IMPRESSION: 1. Decreased pleural effusions following chest tube insertion on each side, with greater drainage of the right pleural effusion than the left. 2. Findings suggest mild pulmonary edema. 3. No pneumothorax.   Electronically Signed   By: Amie Portland M.D.   On: 10/05/2014 09:20   Dg Abd Portable 1v  10/07/2014   CLINICAL DATA:  NG tube placement.  EXAM: PORTABLE ABDOMEN - 1 VIEW  COMPARISON:  10/05/2014  FINDINGS: Nasogastric tube passes below the diaphragm into the mid stomach. Enteric tube seen previously has been removed.  There are air-filled loops of nondistended bowel in the upper abdomen.  IMPRESSION: NG tube well positioned with its tip in the mid stomach.   Electronically Signed   By: Amie Portland M.D.   On: 10/07/2014 15:13   Dg Abd Portable 1v  10/05/2014   CLINICAL DATA:  Recent feeding catheter placed  EXAM: PORTABLE ABDOMEN - 1 VIEW  COMPARISON:  09/30/2014  FINDINGS: Feeding catheter is noted in the proximal portion of the jejunum in satisfactory position. Bilateral PleurX catheters are noted. The previously seen nasogastric catheter is been removed in the interval from the prior exam.  IMPRESSION: Feeding catheter within the proximal jejunum.   Electronically Signed   By: Alcide Clever M.D.   On: 10/05/2014 16:34   Dg C-arm 1-60 Min-no Report  10/13/2014   CLINICAL DATA:  Respiratory failure.  EXAM: DG C-ARM 1-60 MIN - NRPT [facility]; PORTABLE CHEST - 1 VIEW  COMPARISON:  10/09/2014.  FINDINGS: Tracheostomy tube noted in stable position. NG tube and right PICC line stable position. Bilateral chest tubes in stable position. Mediastinum hilar structures are stable. Prior CABG. Heart size stable. Bilateral  pulmonary infiltrates are present. These could represent changes of bilateral pulmonary edema and/or pneumonia. Small bilateral pleural effusions.  IMPRESSION: 1. Lines and tubes in stable position including stable position of bilateral chest tubes. No pneumothorax. 2. Cardiomegaly. Prior CABG . Bilateral pulmonary infiltrates and small pleural effusions suggesting congestive heart failure. Bilateral pneumonia cannot be excluded .  Electronically Signed   By: Maisie Fus  Register   On: 10/13/2014 08:16    Labs:  CBC:  Recent Labs  10/15/14 1154 10/18/14 0521 10/26/14 0607 11/02/14 0500  WBC 9.7 11.1* 9.1 11.4*  HGB 8.4* 8.5* 8.0* 8.0*  HCT 26.7* 27.6* 25.9* 25.8*  PLT 267 335 232 176    COAGS:  Recent Labs  09/13/14 0357 09/13/14 1400 09/13/14 1859 09/14/14 0323 09/21/14 0900 10/04/14 1100  INR 1.05  --  2.48* 1.60* 1.01 1.16  APTT 34 72* 83*  --   --  32    BMP:  Recent Labs  10/18/14 0521 10/26/14 0607 10/27/14 0727 11/02/14 0500  NA 138 143 143 139  K 4.4 3.8 3.8 3.8  CL 99* 103 98* 96*  CO2 30 32 36* 32  GLUCOSE 142* 174* 166* 172*  BUN 41* 45* 45* 54*  CALCIUM 8.7* 8.3* 8.6* 7.9*  CREATININE 1.13 0.87 0.99 1.11  GFRNONAA >60 >60 >60 >60  GFRAA >60 >60 >60 >60    LIVER FUNCTION TESTS:  Recent Labs  10/02/14 0500 10/03/14 0450 10/04/14 0500 10/06/14 0545  BILITOT 2.4* 2.3* 2.1* 2.0*  AST 68* 67* 58* 53*  ALT 52 52 50 32  ALKPHOS 281* 260* 237* 228*  PROT 5.3* 5.2* 5.5* 5.8*  ALBUMIN 2.3* 2.2* 2.1* 2.5*    TUMOR MARKERS: No results for input(s): AFPTM, CEA, CA199, CHROMGRNA in the last 8760 hours.  Assessment and Plan:  Scheduled for G tube  Secondary PCM Dysphagia; deconditioning Plan for 11/06/14 in IR See orders Risks and Benefits discussed with the patient including, but not limited to the need for a barium enema during the procedure, bleeding, infection, peritonitis, or damage to adjacent structures. All of the patient's questions were  answered, patient is agreeable to proceed. Consent signed and in chart.   Thank you for this interesting consult.  I greatly enjoyed meeting KODIAK ROLLYSON and look forward to participating in their care.  Signed: Asees Manfredi A 11/03/2014, 10:30 AM   I spent a total of 40 Minutes    in face to face in clinical consultation, greater than 50% of which was counseling/coordinating care for perc G tube

## 2014-11-04 LAB — CBC
HEMATOCRIT: 28.3 % — AB (ref 39.0–52.0)
Hemoglobin: 8.6 g/dL — ABNORMAL LOW (ref 13.0–17.0)
MCH: 31.5 pg (ref 26.0–34.0)
MCHC: 30.4 g/dL (ref 30.0–36.0)
MCV: 103.7 fL — AB (ref 78.0–100.0)
PLATELETS: 199 10*3/uL (ref 150–400)
RBC: 2.73 MIL/uL — AB (ref 4.22–5.81)
RDW: 18.2 % — AB (ref 11.5–15.5)
WBC: 17.5 10*3/uL — ABNORMAL HIGH (ref 4.0–10.5)

## 2014-11-04 LAB — BASIC METABOLIC PANEL
Anion gap: 12 (ref 5–15)
BUN: 40 mg/dL — ABNORMAL HIGH (ref 6–20)
CALCIUM: 7.8 mg/dL — AB (ref 8.9–10.3)
CHLORIDE: 99 mmol/L — AB (ref 101–111)
CO2: 32 mmol/L (ref 22–32)
CREATININE: 1.08 mg/dL (ref 0.61–1.24)
Glucose, Bld: 180 mg/dL — ABNORMAL HIGH (ref 65–99)
Potassium: 3.8 mmol/L (ref 3.5–5.1)
SODIUM: 143 mmol/L (ref 135–145)

## 2014-11-05 ENCOUNTER — Other Ambulatory Visit (HOSPITAL_COMMUNITY): Payer: Self-pay

## 2014-11-05 LAB — URINALYSIS, ROUTINE W REFLEX MICROSCOPIC
BILIRUBIN URINE: NEGATIVE
GLUCOSE, UA: NEGATIVE mg/dL
HGB URINE DIPSTICK: NEGATIVE
Ketones, ur: NEGATIVE mg/dL
Leukocytes, UA: NEGATIVE
NITRITE: NEGATIVE
PH: 5.5 (ref 5.0–8.0)
Protein, ur: NEGATIVE mg/dL
Specific Gravity, Urine: 1.017 (ref 1.005–1.030)
Urobilinogen, UA: 1 mg/dL (ref 0.0–1.0)

## 2014-11-06 LAB — CBC
HEMATOCRIT: 24.9 % — AB (ref 39.0–52.0)
Hemoglobin: 7.7 g/dL — ABNORMAL LOW (ref 13.0–17.0)
MCH: 32.1 pg (ref 26.0–34.0)
MCHC: 30.9 g/dL (ref 30.0–36.0)
MCV: 103.8 fL — AB (ref 78.0–100.0)
PLATELETS: 179 10*3/uL (ref 150–400)
RBC: 2.4 MIL/uL — ABNORMAL LOW (ref 4.22–5.81)
RDW: 18 % — ABNORMAL HIGH (ref 11.5–15.5)
WBC: 11.7 10*3/uL — ABNORMAL HIGH (ref 4.0–10.5)

## 2014-11-06 LAB — APTT: APTT: 28 s (ref 24–37)

## 2014-11-06 LAB — PROTIME-INR
INR: 1.16 (ref 0.00–1.49)
PROTHROMBIN TIME: 15 s (ref 11.6–15.2)

## 2014-11-06 LAB — PROCALCITONIN: Procalcitonin: <0.1 ng/mL

## 2014-11-06 LAB — BRAIN NATRIURETIC PEPTIDE: B Natriuretic Peptide: 710.6 pg/mL — ABNORMAL HIGH (ref 0.0–100.0)

## 2014-11-06 NOTE — Progress Notes (Signed)
Patient ID: [name], male   DOB: 1941/02/18, 74 y.o.   MRN: [id number]  Unable to accommodate patient in the IR schedule today.  Tube feeds resumed.  Will plan for Perc G-Tube tomorrow.  Discussed with patient and he is aware and understands.  Pre-op orders done.  Corrin ParkerWendy Alhaji Mcneal, PA-C

## 2014-11-06 NOTE — Progress Notes (Signed)
   Name: [name] MRN: [id number] DOB: [birth year]    ADMISSION DATE:  10/05/2014 CONSULTATION DATE:  10/06/14  REFERRING MD :  Dr. Sharyon MedicusHijazi   CHIEF COMPLAINT:  Acute Respiratory Failure, Bilateral Pleural Effusions s/p bilateral PleurX   BRIEF PATIENT DESCRIPTION: 74 y/o M with recent prolonged admission for STEMI s/p PCI, CABG with subsequent cardiogenic shock.  He is s/p trach/PEG placement.  [facility] for further weaning / rehab efforts.    STUDIES: 5/03 Echo >> EF 50%, grade 2 diastolic dysfx  SUBJECTIVE: Placed back on vent.  VITAL SIGNS: HR 96, SaO2 95%  PHYSICAL EXAMINATION: General: ill appearing Neuro: RASS -1 HEENT: trach in place Cardiovascular:  regular Lungs: scattered crackles Abdomen: non tender Ext: no edema    CMP Latest Ref Rng 11/04/2014 11/02/2014 10/27/2014  Glucose 65 - 99 mg/dL 829(F180(H) 621(H172(H) 086(V166(H)  BUN 6 - 20 mg/dL 78(I40(H) 69(G54(H) 29(B45(H)  Creatinine 0.61 - 1.24 mg/dL 2.841.08 1.321.11 4.400.99  Sodium 135 - 145 mmol/L 143 139 143  Potassium 3.5 - 5.1 mmol/L 3.8 3.8 3.8  Chloride 101 - 111 mmol/L 99(L) 96(L) 98(L)  CO2 22 - 32 mmol/L 32 32 36(H)  Calcium 8.9 - 10.3 mg/dL 7.8(L) 7.9(L) 8.6(L)  Total Protein 6.5 - 8.1 g/dL - - -  Total Bilirubin 0.3 - 1.2 mg/dL - - -  Alkaline Phos 38 - 126 U/L - - -  AST 15 - 41 U/L - - -  ALT 17 - 63 U/L - - -    CBC Latest Ref Rng 11/06/2014 11/04/2014 11/02/2014  WBC 4.0 - 10.5 K/uL 11.7(H) 17.5(H) 11.4(H)  Hemoglobin 13.0 - 17.0 g/dL 7.7(L) 8.6(L) 8.0(L)  Hematocrit 39.0 - 52.0 % 24.9(L) 28.3(L) 25.8(L)  Platelets 150 - 400 K/uL 179 199 176    Dg Chest Port 1 View  11/05/2014   CLINICAL DATA:  Pneumonia, hypertension, GERD  EXAM: PORTABLE CHEST - 1 VIEW  COMPARISON:  Portable exam 0943 hours compared to 10/31/2014  FINDINGS: Tip of tracheostomy tube projects 6.3 cm above carina.  Nasogastric tube extends into stomach.  RIGHT arm PICC line tip projects over cavoatrial junction.  Normal heart size post CABG.  Slight vascular  congestion.  Scattered interstitial infiltrate in the mid to lower RIGHT lung, less on LEFT, question asymmetric edema versus less likely infection.  Small RIGHT pleural effusion.  No pneumothorax.  Underlying COPD changes.  IMPRESSION: COPD changes with question asymmetric edema and RIGHT pleural effusion.   Electronically Signed   By: Ulyses SouthwardMark  Boles M.D.   On: 11/05/2014 11:32      ASSESSMENT XLT trach 5/31 >>  Acute on chronic respiratory failure with failure to wean from vent s/p tracheostomy. Hx of COPD. B/l pleural effusions s/p b/l pleurx catheter placement 5/05. Plan: - wean vent as tolerated - negative fluid balance as tolerated - drain pleural fluid qod up to 1 liter at a time - f/u CXR - pulmicort, duoneb  CAD s/p CABG. Deconditioning. Protein calorie malnutrition. Hypotension. Anemia of chronic disease. Plan: - per primary team  Dysphagia. Plan: - scheduled for G tube placement by IR  D/w Dr. Sharyon MedicusHijazi.  Coralyn HellingVineet Samanth Mirkin, MD [facility] Pulmonary/Critical Care 11/06/2014, 11:28 AM Pager:  [phone] After 3pm call: [phone]

## 2014-11-07 ENCOUNTER — Institutional Professional Consult (permissible substitution) (HOSPITAL_COMMUNITY): Payer: Self-pay

## 2014-11-07 MED ORDER — GLUCAGON HCL RDNA (DIAGNOSTIC) 1 MG IJ SOLR
INTRAMUSCULAR | Status: AC
  Filled 2014-11-07: qty 1

## 2014-11-07 MED ORDER — CEFAZOLIN SODIUM-DEXTROSE 2-3 GM-% IV SOLR
INTRAVENOUS | Status: AC
  Filled 2014-11-07: qty 50

## 2014-11-07 MED ORDER — MIDAZOLAM HCL 2 MG/2ML IJ SOLN
INTRAMUSCULAR | Status: AC
  Filled 2014-11-07: qty 2

## 2014-11-07 MED ORDER — FENTANYL CITRATE (PF) 100 MCG/2ML IJ SOLN
INTRAMUSCULAR | Status: AC
  Filled 2014-11-07: qty 2

## 2014-11-07 MED ORDER — CEFAZOLIN SODIUM-DEXTROSE 2-3 GM-% IV SOLR: 2.0000 g | Freq: Once | INTRAVENOUS | Status: DC

## 2014-11-07 MED ORDER — LIDOCAINE HCL 1 % IJ SOLN
INTRAMUSCULAR | Status: AC
  Filled 2014-11-07: qty 20

## 2014-11-07 NOTE — Procedures (Signed)
Interventional Radiology Procedure Note  Procedure: Limited Fluoro study in IR suite for attempted Perc Gastrostomy tube. Findings:  No safe window for access percutaneously.  After inflating the stomach significantly, there was bowel/colon interposed b/w the stomach wall and the muscle wall.   Complications: None Recommendations:  - Recommend surgical consultation for possible surgical placement.    Signed,  Yvone NeuJaime S. Loreta AveWagner, DO

## 2014-11-07 NOTE — Sedation Documentation (Signed)
Unable to proceed with procedure due to MD findings

## 2014-11-08 LAB — CBC
HEMATOCRIT: 29.4 % — AB (ref 39.0–52.0)
HEMOGLOBIN: 8.9 g/dL — AB (ref 13.0–17.0)
MCH: 26.2 pg (ref 26.0–34.0)
MCHC: 30.3 g/dL (ref 30.0–36.0)
MCV: 86.5 fL (ref 78.0–100.0)
Platelets: 405 10*3/uL — ABNORMAL HIGH (ref 150–400)
RBC: 3.4 MIL/uL — ABNORMAL LOW (ref 4.22–5.81)
RDW: 17.3 % — ABNORMAL HIGH (ref 11.5–15.5)
WBC: 9.2 10*3/uL (ref 4.0–10.5)

## 2014-11-09 LAB — BASIC METABOLIC PANEL
Anion gap: 8 (ref 5–15)
BUN: 43 mg/dL — ABNORMAL HIGH (ref 6–20)
CHLORIDE: 104 mmol/L (ref 101–111)
CO2: 34 mmol/L — AB (ref 22–32)
Calcium: 8.1 mg/dL — ABNORMAL LOW (ref 8.9–10.3)
Creatinine, Ser: 1.02 mg/dL (ref 0.61–1.24)
GFR calc Af Amer: >60 mL/min (ref >60)
GFR calc non Af Amer: >60 mL/min (ref >60)
GLUCOSE: 169 mg/dL — AB (ref 65–99)
POTASSIUM: 3.2 mmol/L — AB (ref 3.5–5.1)
SODIUM: 146 mmol/L — AB (ref 135–145)

## 2014-11-09 LAB — PROTIME-INR
INR: 1.25 (ref 0.00–1.49)
PROTHROMBIN TIME: 15.8 s — AB (ref 11.6–15.2)

## 2014-11-09 NOTE — Progress Notes (Signed)
Name: [name] MRN: [id number] DOB: 1940-12-20    ADMISSION DATE:  10/05/2014 CONSULTATION DATE:  10/06/14  REFERRING MD :  Dr. Sharyon Medicus   CHIEF COMPLAINT:  Acute Respiratory Failure, Bilateral Pleural Effusions s/p bilateral PleurX   BRIEF PATIENT DESCRIPTION: 74 y/o M with recent prolonged admission for STEMI s/p PCI, CABG with subsequent cardiogenic shock.  He is s/p trach/PEG placement.  Tx to [facility] 5/5 for further weaning / rehab efforts.    STUDIES: 5/03 Echo >> EF 50%, grade 2 diastolic dysfx  SUBJECTIVE: Placed back on vent.  VITAL SIGNS: 120/78 97 18 98.3 100%  PHYSICAL EXAMINATION: General: ill appearing, sitting in chair, writes notes Neuro: RASS 0 HEENT: trach in place Cardiovascular:  regular Lungs: scattered crackles Abdomen: non tender Ext: no edema    CMP Latest Ref Rng 11/09/2014 11/04/2014 11/02/2014  Glucose 65 - 99 mg/dL 970(Y) 637(C) 588(F)  BUN 6 - 20 mg/dL 02(D) 74(J) 28(N)  Creatinine 0.61 - 1.24 mg/dL 8.67 6.72 0.94  Sodium 135 - 145 mmol/L 146(H) 143 139  Potassium 3.5 - 5.1 mmol/L 3.2(L) 3.8 3.8  Chloride 101 - 111 mmol/L 104 99(L) 96(L)  CO2 22 - 32 mmol/L 34(H) 32 32  Calcium 8.9 - 10.3 mg/dL 8.1(L) 7.8(L) 7.9(L)  Total Protein 6.5 - 8.1 g/dL - - -  Total Bilirubin 0.3 - 1.2 mg/dL - - -  Alkaline Phos 38 - 126 U/L - - -  AST 15 - 41 U/L - - -  ALT 17 - 63 U/L - - -    CBC Latest Ref Rng 11/08/2014 11/06/2014 11/04/2014  WBC 4.0 - 10.5 K/uL 9.2 11.7(H) 17.5(H)  Hemoglobin 13.0 - 17.0 g/dL 7.0(J) 7.7(L) 8.6(L)  Hematocrit 39.0 - 52.0 % 29.4(L) 24.9(L) 28.3(L)  Platelets 150 - 400 K/uL 405(H) 179 199    Ir Fluoro Rm 30-60 Min  11/07/2014   CLINICAL DATA:  74 year old male with chronic respiratory insufficiency, and dysphagia, referred for percutaneous gastrostomy tube.  The CT completed 11/02/2014 demonstrates difficult window for percutaneous gastrostomy placement with both colon and small bowel overlying the stomach wall.  The patient will be  brought IR for attempted percutaneous gastrostomy tube, with documentation of presence of a safe window.  EXAM: Limited fluoroscopy study  Date:  6/7/20166/12/2014 11:58 am  FLUOROSCOPY TIME:  2 minutes 6 seconds  MEDICATIONS AND MEDICAL HISTORY: None  ANESTHESIA/SEDATION: None  CONTRAST:  None  COMPLICATIONS: None  PROCEDURE: Informed consent was obtained from the patient following explanation of the procedure, risks, benefits and alternatives. The patient understands, agrees and consents for the procedure. All questions were addressed. A time out was performed.  The patient is position in the supine position on the fluoroscopy table, and using fluoroscopy guidance, a multipurpose catheter was for red added through the esophagus via the oral cavity, into the stomach. This was confirmed with fluoroscopy and infusion of air.  Under fluoroscopic imaging, the stomach was well distended by infusing air, in an attempt to depress small bowel and colon below the stomach cavity.  Both AP and lateral images demonstrate interposed colon/small bowel between the stomach wall and the anterior abdominal wall.  No attempt was made, with the risk of bowel perforation.  Patient remained hemodynamically stable throughout was transported to his room after the procedure.  IMPRESSION: Status post fluoroscopic study for attempted percutaneous gastrostomy tube, with no safe window identified. There is small bowel and colon interposed between the abdominal wall and stomach wall with significant distention of  the stomach.  Signed,  Yvone Neu. Loreta Ave DO  [facility]  [facility] Radiology  PLAN: Consultation for possible surgical placement may be considered.   Electronically Signed   By: Gilmer Mor D.O.   On: 11/07/2014 12:15      ASSESSMENT XLT trach 5/31 >>  Acute on chronic respiratory failure with failure to wean from vent s/p tracheostomy. Hx of COPD. B/l pleural effusions s/p b/l pleurx  catheter placement 5/05. Plan: - wean vent as tolerated - negative fluid balance as tolerated - drain pleural fluid qod up to 1 liter at a time - f/u CXR - pulmicort, duoneb  CAD s/p CABG. Deconditioning. Protein calorie malnutrition. Hypotension. Anemia of chronic disease. Plan: - per primary team  Dysphagia. Plan: - scheduled for G tube placement by IR, 6/7 IR unable to place, suggested surgical consult.    [name] ACNP [name] Pager [phone] till 3 pm If no answer page [phone] 11/09/2014, 9:15 AM  Reviewed above, examined.  He is sitting in chair.  Feels cold.  Denies chest/abd pain.  Oxygen desaturation with change of position >> was doing pressure support before this.  Trach site clean.  Scattered crackles, no wheeze.  Heart rate regular, abdomen soft.  Follows commands.  Continue vent weaning as tolerated.  Drain pleural fluid qod.  Continue BD regimen.  Coralyn Helling, MD [facility] Pulmonary/Critical Care 11/09/2014, 10:11 AM Pager:  [phone] After 3pm call: [phone]

## 2014-11-10 LAB — BASIC METABOLIC PANEL
ANION GAP: 6 (ref 5–15)
BUN: 33 mg/dL — ABNORMAL HIGH (ref 6–20)
CO2: 33 mmol/L — ABNORMAL HIGH (ref 22–32)
Calcium: 7.8 mg/dL — ABNORMAL LOW (ref 8.9–10.3)
Chloride: 107 mmol/L (ref 101–111)
Creatinine, Ser: 0.86 mg/dL (ref 0.61–1.24)
GFR calc non Af Amer: >60 mL/min (ref >60)
GLUCOSE: 146 mg/dL — AB (ref 65–99)
Potassium: 3.6 mmol/L (ref 3.5–5.1)
Sodium: 146 mmol/L — ABNORMAL HIGH (ref 135–145)

## 2014-11-10 LAB — MAGNESIUM: MAGNESIUM: 2.1 mg/dL (ref 1.7–2.4)

## 2014-11-10 LAB — HEMOGLOBIN A1C
Hgb A1c MFr Bld: 5.6 % (ref 4.8–5.6)
MEAN PLASMA GLUCOSE: 114 mg/dL

## 2014-11-11 ENCOUNTER — Other Ambulatory Visit (HOSPITAL_COMMUNITY): Payer: Self-pay

## 2014-11-11 LAB — BASIC METABOLIC PANEL
Anion gap: 7 (ref 5–15)
BUN: 32 mg/dL — AB (ref 6–20)
CO2: 34 mmol/L — ABNORMAL HIGH (ref 22–32)
Calcium: 8.1 mg/dL — ABNORMAL LOW (ref 8.9–10.3)
Chloride: 107 mmol/L (ref 101–111)
Creatinine, Ser: 0.81 mg/dL (ref 0.61–1.24)
GFR calc non Af Amer: >60 mL/min (ref >60)
GLUCOSE: 152 mg/dL — AB (ref 65–99)
POTASSIUM: 4.1 mmol/L (ref 3.5–5.1)
SODIUM: 148 mmol/L — AB (ref 135–145)

## 2014-11-11 LAB — MAGNESIUM: Magnesium: 1.9 mg/dL (ref 1.7–2.4)

## 2014-11-11 LAB — HEMOGLOBIN AND HEMATOCRIT, BLOOD
HCT: 27.9 % — ABNORMAL LOW (ref 39.0–52.0)
HEMOGLOBIN: 8.5 g/dL — AB (ref 13.0–17.0)

## 2014-11-12 LAB — BASIC METABOLIC PANEL
ANION GAP: 9 (ref 5–15)
BUN: 35 mg/dL — AB (ref 6–20)
CO2: 36 mmol/L — ABNORMAL HIGH (ref 22–32)
Calcium: 8.2 mg/dL — ABNORMAL LOW (ref 8.9–10.3)
Chloride: 101 mmol/L (ref 101–111)
Creatinine, Ser: 0.88 mg/dL (ref 0.61–1.24)
GFR calc Af Amer: >60 mL/min (ref >60)
GLUCOSE: 157 mg/dL — AB (ref 65–99)
Potassium: 3.4 mmol/L — ABNORMAL LOW (ref 3.5–5.1)
Sodium: 146 mmol/L — ABNORMAL HIGH (ref 135–145)

## 2014-11-12 LAB — PREPARE RBC (CROSSMATCH)

## 2014-11-12 LAB — BRAIN NATRIURETIC PEPTIDE: B Natriuretic Peptide: 645.6 pg/mL — ABNORMAL HIGH (ref 0.0–100.0)

## 2014-11-13 DIAGNOSIS — Z431 Encounter for attention to gastrostomy: Secondary | ICD-10-CM

## 2014-11-13 LAB — CBC WITH DIFFERENTIAL/PLATELET
Basophils Absolute: 0 10*3/uL (ref 0.0–0.1)
Basophils Relative: 0 % (ref 0–1)
Eosinophils Absolute: 0 10*3/uL (ref 0.0–0.7)
Eosinophils Relative: 0 % (ref 0–5)
HEMATOCRIT: 31.6 % — AB (ref 39.0–52.0)
Hemoglobin: 9.9 g/dL — ABNORMAL LOW (ref 13.0–17.0)
LYMPHS PCT: 4 % — AB (ref 12–46)
Lymphs Abs: 0.4 10*3/uL — ABNORMAL LOW (ref 0.7–4.0)
MCH: 32.2 pg (ref 26.0–34.0)
MCHC: 31.3 g/dL (ref 30.0–36.0)
MCV: 102.9 fL — ABNORMAL HIGH (ref 78.0–100.0)
MONO ABS: 0.4 10*3/uL (ref 0.1–1.0)
Monocytes Relative: 4 % (ref 3–12)
NEUTROS ABS: 8.8 10*3/uL — AB (ref 1.7–7.7)
Neutrophils Relative %: 92 % — ABNORMAL HIGH (ref 43–77)
Platelets: 188 10*3/uL (ref 150–400)
RBC: 3.07 MIL/uL — AB (ref 4.22–5.81)
RDW: 18.8 % — ABNORMAL HIGH (ref 11.5–15.5)
WBC: 9.6 10*3/uL (ref 4.0–10.5)

## 2014-11-13 LAB — TYPE AND SCREEN
ABO/RH(D): O POS
Antibody Screen: NEGATIVE
Unit division: 0

## 2014-11-13 LAB — COMPREHENSIVE METABOLIC PANEL
ALBUMIN: 2.2 g/dL — AB (ref 3.5–5.0)
ALT: 39 U/L (ref 17–63)
AST: 32 U/L (ref 15–41)
Alkaline Phosphatase: 97 U/L (ref 38–126)
Anion gap: 6 (ref 5–15)
BUN: 39 mg/dL — ABNORMAL HIGH (ref 6–20)
CHLORIDE: 102 mmol/L (ref 101–111)
CO2: 38 mmol/L — AB (ref 22–32)
Calcium: 8.4 mg/dL — ABNORMAL LOW (ref 8.9–10.3)
Creatinine, Ser: 0.89 mg/dL (ref 0.61–1.24)
GFR calc Af Amer: >60 mL/min (ref >60)
GFR calc non Af Amer: >60 mL/min (ref >60)
GLUCOSE: 153 mg/dL — AB (ref 65–99)
POTASSIUM: 3.9 mmol/L (ref 3.5–5.1)
SODIUM: 146 mmol/L — AB (ref 135–145)
TOTAL PROTEIN: 4.9 g/dL — AB (ref 6.5–8.1)
Total Bilirubin: 0.8 mg/dL (ref 0.3–1.2)

## 2014-11-13 NOTE — Progress Notes (Signed)
   Name: [name] MRN: [id number] DOB: Mar 30, 1941    ADMISSION DATE:  10/05/2014 CONSULTATION DATE:  10/06/14  REFERRING MD :  Dr. Sharyon Medicus   CHIEF COMPLAINT:  Acute Respiratory Failure, Bilateral Pleural Effusions s/p bilateral PleurX   BRIEF PATIENT DESCRIPTION: 74 y/o M with recent prolonged admission for STEMI s/p PCI, CABG with subsequent cardiogenic shock.  He is s/p trach/PEG placement.  Tx to [facility] 5/5 for further weaning / rehab efforts.    STUDIES: 5/03 Echo >> EF 50%, grade 2 diastolic dysfx  SUBJECTIVE: Placed back on vent after 10 min TC attempt  VITAL SIGNS: T 98.6 F 85 22 110/70 100% on vent  PHYSICAL EXAMINATION: General: ill appearing, in bed Neuro: RASS 0 HEENT: trach in place Cardiovascular:  Regular s1 s2 RR distant Lungs: scattered crackles bases reduced rt base Abdomen: non tender Ext: no edema    CMP Latest Ref Rng 11/13/2014 11/12/2014 11/11/2014  Glucose 65 - 99 mg/dL 660(Y) 301(S) 010(X)  BUN 6 - 20 mg/dL 32(T) 55(D) 32(K)  Creatinine 0.61 - 1.24 mg/dL 0.25 4.27 0.62  Sodium 135 - 145 mmol/L 146(H) 146(H) 148(H)  Potassium 3.5 - 5.1 mmol/L 3.9 3.4(L) 4.1  Chloride 101 - 111 mmol/L 102 101 107  CO2 22 - 32 mmol/L 38(H) 36(H) 34(H)  Calcium 8.9 - 10.3 mg/dL 3.7(S) 2.8(B) 8.1(L)  Total Protein 6.5 - 8.1 g/dL 4.9(L) - -  Total Bilirubin 0.3 - 1.2 mg/dL 0.8 - -  Alkaline Phos 38 - 126 U/L 97 - -  AST 15 - 41 U/L 32 - -  ALT 17 - 63 U/L 39 - -    CBC Latest Ref Rng 11/13/2014 11/11/2014 11/08/2014  WBC 4.0 - 10.5 K/uL 9.6 - 9.2  Hemoglobin 13.0 - 17.0 g/dL 1.5(V) 7.6(H) 8.9(L)  Hematocrit 39.0 - 52.0 % 31.6(L) 27.9(L) 29.4(L)  Platelets 150 - 400 K/uL 188 - 405(H)    No results found.    ASSESSMENT XLT trach 5/31 >>  Acute on chronic respiratory failure with failure to wean from vent s/p tracheostomy. Hx of COPD. B/l pleural effusions s/p b/l pleurx catheter placement 5/05. Plan: - wean vent as tolerated - negative fluid balance as  tolerated - drain pleural fluid qod up to 1 liter at a time, likely due soon, pcxr concerning for increased effusion - f/u CXR for rt effusion in next 72 hrs - pulmicort, duoneb - weaning 10 min , then had complete failure, needs facitility for vent support -consider discussions on his wishes -6 XLT remains -consider 10 min tid sprints TC  CAD s/p CABG. Deconditioning. Protein calorie malnutrition. Hypotension. Anemia of chronic disease. Plan: - per primary team  Dysphagia. Plan:  surgical consult for PEG needed  Mcarthur Rossetti. Tyson Alias, MD, FACP Pgr: [phone] [facility]

## 2014-11-14 ENCOUNTER — Other Ambulatory Visit (HOSPITAL_COMMUNITY): Payer: Self-pay

## 2014-11-14 LAB — BASIC METABOLIC PANEL
Anion gap: 7 (ref 5–15)
BUN: 40 mg/dL — ABNORMAL HIGH (ref 6–20)
CHLORIDE: 94 mmol/L — AB (ref 101–111)
CO2: 39 mmol/L — ABNORMAL HIGH (ref 22–32)
Calcium: 8.4 mg/dL — ABNORMAL LOW (ref 8.9–10.3)
Creatinine, Ser: 0.87 mg/dL (ref 0.61–1.24)
GFR calc Af Amer: >60 mL/min (ref >60)
GFR calc non Af Amer: >60 mL/min (ref >60)
Glucose, Bld: 155 mg/dL — ABNORMAL HIGH (ref 65–99)
Potassium: 4 mmol/L (ref 3.5–5.1)
Sodium: 140 mmol/L (ref 135–145)

## 2014-11-14 LAB — BRAIN NATRIURETIC PEPTIDE: B Natriuretic Peptide: 633.5 pg/mL — ABNORMAL HIGH (ref 0.0–100.0)

## 2014-11-16 ENCOUNTER — Encounter (HOSPITAL_COMMUNITY): Payer: Self-pay | Admitting: Anesthesiology

## 2014-11-16 NOTE — Progress Notes (Signed)
   Name: [name] MRN: [id number] DOB: 05-08-1941    ADMISSION DATE:  10/05/2014 CONSULTATION DATE:  10/06/14  REFERRING MD :  Dr. Sharyon Medicus   CHIEF COMPLAINT:  Acute Respiratory Failure, Bilateral Pleural Effusions s/p bilateral PleurX   BRIEF PATIENT DESCRIPTION: 74 y/o M with recent prolonged admission for STEMI s/p PCI, CABG with subsequent cardiogenic shock.  He is s/p trach/PEG placement.  Tx to [facility] 5/5 for further weaning / rehab efforts.    STUDIES: 5/03 Echo >> EF 50%, grade 2 diastolic dysfx  SUBJECTIVE: oob to chair , exercising arms with PT  VITAL SIGNS: Afebrile RR 24  On 30%/+5  PHYSICAL EXAMINATION: General:chr  ill appearing Neuro: RASS 0 HEENT: trach in place Cardiovascular:  Regular s1 s2 RR distant Lungs: scattered crackles bases reduced rt base Abdomen: non tender Ext: no edema    CMP Latest Ref Rng 11/14/2014 11/13/2014 11/12/2014  Glucose 65 - 99 mg/dL 915(A) 569(V) 948(A)  BUN 6 - 20 mg/dL 16(P) 53(Z) 48(O)  Creatinine 0.61 - 1.24 mg/dL 7.07 8.67 5.44  Sodium 135 - 145 mmol/L 140 146(H) 146(H)  Potassium 3.5 - 5.1 mmol/L 4.0 3.9 3.4(L)  Chloride 101 - 111 mmol/L 94(L) 102 101  CO2 22 - 32 mmol/L 39(H) 38(H) 36(H)  Calcium 8.9 - 10.3 mg/dL 9.2(E) 1.0(O) 7.1(Q)  Total Protein 6.5 - 8.1 g/dL - 4.9(L) -  Total Bilirubin 0.3 - 1.2 mg/dL - 0.8 -  Alkaline Phos 38 - 126 U/L - 97 -  AST 15 - 41 U/L - 32 -  ALT 17 - 63 U/L - 39 -    CBC Latest Ref Rng 11/13/2014 11/11/2014 11/08/2014  WBC 4.0 - 10.5 K/uL 9.6 - 9.2  Hemoglobin 13.0 - 17.0 g/dL 1.9(X) 5.8(I) 8.9(L)  Hematocrit 39.0 - 52.0 % 31.6(L) 27.9(L) 29.4(L)  Platelets 150 - 400 K/uL 188 - 405(H)    No results found.  CXR 6/14 BL effusions  ASSESSMENT 6 XLT trach 5/31 >>  Acute on chronic respiratory failure with failure to wean from vent s/p tracheostomy. Hx of COPD. B/l pleural effusions s/p b/l pleurx catheter placement 5/05. Plan: - Increase ATC duration as tolerated - did 4h 6/15 -  negative fluid balance as tolerated - drain pleural fluid  prn - f/u CXR next week - pulmicort, duoneb -consider discussions on his wishes -can taper prednisone to off over next 2 weeks   CAD s/p CABG. Deconditioning. Protein calorie malnutrition. Hypotension. Anemia of chronic disease. Plan: - per primary team  Dysphagia. Plan: Plan for PEG based on progress with ATC  Cyril Mourning MD. FCCP. [facility] Pager [phone] If no response call [phone]

## 2014-11-16 NOTE — H&P (Addendum)
Transfer summary                                                                                    Patient Demographics  Ransome Helwig, is a 74 y.o. male  CSN: [id number]  MRN: [id number]  DOB - [birth year]  Admit Date - 10/05/2014  Outpatient Primary MD for the patient is Josue Hector, MD   With History of -  Past Medical History  Diagnosis Date  . Dyspnea   . Palpitations   . Anxiety and depression   . GERD (gastroesophageal reflux disease)   . Hypertension   . Hypercholesteremia       Past Surgical History  Procedure Laterality Date  . Appendectomy    . Cataract extraction w/phaco  05/06/2012    Procedure: CATARACT EXTRACTION PHACO AND INTRAOCULAR LENS PLACEMENT (IOC);  Surgeon: Gemma Payor, MD;  Location: [facility];  Service: Ophthalmology;  Laterality: Right;  CDE:22.64  . Cataract extraction w/phaco  05/24/2012    Procedure: CATARACT EXTRACTION PHACO AND INTRAOCULAR LENS PLACEMENT (IOC);  Surgeon: Gemma Payor, MD;  Location: [facility];  Service: Ophthalmology;  Laterality: Left;  CDE: 27.08  . Cardiac catheterization  09/13/2014    Procedure: VENTRICULAR ASSIST DEVICE INSERTION;  Surgeon: Corky Crafts, MD;  Location: [facility] CATH LAB;  Service: Cardiovascular;;  . Cardiac catheterization  09/13/2014    Procedure: CORONARY BALLOON ANGIOPLASTY;  Surgeon: Corky Crafts, MD;  Location: [facility] CATH LAB;  Service: Cardiovascular;;  . Coronary angiogram  09/13/2014    Procedure: CORONARY ANGIOGRAM;  Surgeon: Corky Crafts, MD;  Location: [facility] CATH LAB;  Service: Cardiovascular;;  . Coronary artery bypass graft N/A 09/13/2014    Procedure: CORONARY ARTERY BYPASS GRAFTING (CABG), ON PUMP, TIMES THREE, USING LEFT INTERNAL MAMMARY ARTERY, LEFT GREATER SAPHENOUS VEIN HARVESTED ENDOSCOPICALLY;  Surgeon: Kerin Perna, MD;  Location: [facility] OR;  Service: Open Heart Surgery;  Laterality: N/A;  . Patent foramen ovale closure  09/13/2014    Procedure: PATENT FORAMEN OVALE CLOSURE;   Surgeon: Kerin Perna, MD;  Location: [facility] OR;  Service: Open Heart Surgery;;  . Chest tube insertion Bilateral 10/05/2014    Procedure: INSERTION PLEURAL DRAINAGE CATHETER;  Surgeon: Kerin Perna, MD;  Location: [facility] OR;  Service: Thoracic;  Laterality: Bilateral;  . Tracheostomy tube placement N/A 09/26/2014    Procedure: TRACHEOSTOMY;  Surgeon: Kerin Perna, MD;  Location: [facility] OR;  Service: Thoracic;  Laterality: N/A;  . Chest tube insertion Left 09/26/2014    Procedure: CHEST TUBE INSERTION;  Surgeon: Kerin Perna, MD;  Location: [facility] OR;  Service: Thoracic;  Laterality: Left;       HPI  Thamas Appleyard  is a 74 y.o. male, with past medical history significant for anxiety depression hypertension and hypercholesterolemia who was admitted to [facility] on 09/13/2014 with acute ST elevation MI he underwent cardiac catheterization and CABG 3 on the same day and then LVAD  was placed and removed later on 09/20/2014 . His stay at [facility] was complicated with atrial fibrillation with rapid ventricular response status post DC cardioversion and amiodarone. It was also complicated by cardiac arrest due to tampon out,  pneumonia treated and protein calorie malnutrition status post NG tube placement on 10/05/2014. The patient was placed on 09/26/2014 and bilateral pleurex catheters were placed for continuous and recurrent pleural effusions and patient was transferred to our hospital. Upon receiving this patient we started him on weaning trials and his Levophed was weaned till off. His Pleurx catheters continued to drain and later on his left Pleurx catheter was removed with the right Pleurx catheter still in and draining. He remained in mild congestive heart failure and his diuresis was felt to be difficult due to to relative hypotension for which he was on midodrine. He remained stable cardiac wise however his weaning efforts did not go as well and he had a setback after which he had a  bronchoscopy that grew MSSA and he was treated by Ancef for 8 days. IR was consulted for placement of a PEG tube however were unable to do due to the inability to find a window for placement and surgical consult was advised. To note is that the patient during his stay remained in normal sinus rhythm and relatively stable cardiac wise. We felt that the main reason for inability to wean was increased anxiety.  Review of Systems    In addition to the HPI above,  No Fever-chills, No Headache, No changes with Vision or hearing, No problems swallowing food or Liquids, No Chest pain,  No Abdominal pain, No Nausea or Vommitting, Bowel movements are regular, No Blood in stool or Urine, No dysuria, No new skin rashes or bruises, No new joints pains-aches,  No new weakness, tingling, numbness in any extremity, No recent weight gain or loss, No polyuria, polydypsia or polyphagia, No significant Mental Stressors.  A full 10 point Review of Systems was done, except as stated above, all other Review of Systems were negative.   Social History History  Substance Use Topics  . Smoking status: Current Every Day Smoker -- 1.00 packs/day for 40 years    Types: Cigarettes  . Smokeless tobacco: Not on file  . Alcohol Use: No     Family History Family History  Problem Relation Age of Onset  . Coronary artery disease       Medications  Paper attached                                                                                                                                                                            Allergies  Allergen Reactions  . Penicillins Nausea And Vomiting    Physical Exam  Vitals  Blood pressure 121/66, pulse 101, resp. rate 17, SpO2 96 %.   1. General elderly white male, chronically ill, well-developed  2. Normal affect and insight, Not Suicidal or  Homicidal, anxious, Awake Alert, Oriented X 3.  3. No F.N deficits, ALL C.Nerves  Intact, Strength 5/5 all 4 extremities, Sensation intact all 4 extremities, Plantars down going.  4. Ears and Eyes appear Normal, Conjunctivae clear, PERRLA. Moist Oral Mucosa. NG tube noted  5. Supple Neck, No JVD, No cervical lymphadenopathy appriciated, No Carotid Bruits.  6. Symmetrical Chest wall movement, decreased breath sounds with bilateral basilar crackles.  7. RRR, No Gallops, Rubs or Murmurs, No Parasternal Heave.  8. Positive Bowel Sounds, Abdomen Soft, Non tender, No organomegaly appriciated,No rebound -guarding or rigidity.  9.  No Cyanosis, pale No Skin Rash or Bruise.  10. Good muscle tone,  joints appear normal , no effusions, Normal ROM.  11. No Palpable Lymph Nodes in Neck or Axillae    Data Review  CBC  Recent Labs Lab 11/11/14 0557 11/13/14 0539  WBC  --  9.6  HGB 8.5* 9.9*  HCT 27.9* 31.6*  PLT  --  188  MCV  --  102.9*  MCH  --  32.2  MCHC  --  31.3  RDW  --  18.8*  LYMPHSABS  --  0.4*  MONOABS  --  0.4  EOSABS  --  0.0  BASOSABS  --  0.0   ------------------------------------------------------------------------------------------------------------------  Chemistries   Recent Labs Lab 11/10/14 0635 11/11/14 0557 11/12/14 0538 11/13/14 0539 11/14/14 0444  NA 146* 148* 146* 146* 140  K 3.6 4.1 3.4* 3.9 4.0  CL 107 107 101 102 94*  CO2 33* 34* 36* 38* 39*  GLUCOSE 146* 152* 157* 153* 155*  BUN 33* 32* 35* 39* 40*  CREATININE 0.86 0.81 0.88 0.89 0.87  CALCIUM 7.8* 8.1* 8.2* 8.4* 8.4*  MG 2.1 1.9  --   --   --   AST  --   --   --  32  --   ALT  --   --   --  39  --   ALKPHOS  --   --   --  97  --   BILITOT  --   --   --  0.8  --    ------------------------------------------------------------------------------------------------------------------ CrCl cannot be calculated (Unknown ideal weight.). ------------------------------------------------------------------------------------------------------------------ No results for  input(s): TSH, T4TOTAL, T3FREE, THYROIDAB in the last 72 hours.  Invalid input(s): FREET3   Coagulation profile No results for input(s): INR, PROTIME in the last 168 hours. ------------------------------------------------------------------------------------------------------------------- No results for input(s): DDIMER in the last 72 hours. -------------------------------------------------------------------------------------------------------------------  Cardiac Enzymes No results for input(s): CKMB, TROPONINI, MYOGLOBIN in the last 168 hours.  Invalid input(s): CK ------------------------------------------------------------------------------------------------------------------ Invalid input(s): POCBNP   ---------------------------------------------------------------------------------------------------------------  Urinalysis    Component Value Date/Time   COLORURINE YELLOW 11/05/2014 1445   APPEARANCEUR CLEAR 11/05/2014 1445   LABSPEC 1.017 11/05/2014 1445   PHURINE 5.5 11/05/2014 1445   GLUCOSEU NEGATIVE 11/05/2014 1445   HGBUR NEGATIVE 11/05/2014 1445   BILIRUBINUR NEGATIVE 11/05/2014 1445   KETONESUR NEGATIVE 11/05/2014 1445   PROTEINUR NEGATIVE 11/05/2014 1445   UROBILINOGEN 1.0 11/05/2014 1445   NITRITE NEGATIVE 11/05/2014 1445   LEUKOCYTESUR NEGATIVE 11/05/2014 1445    ----------------------------------------------------------------------------------------------------------------   Imaging results:   Ct Abdomen Wo Contrast  11/02/2014   CLINICAL DATA:  CHF, pleural effusions, deconditioned state, evaluate for percutaneous gastrostomy placement. Plain radiographs demonstrate the air distended colon high in the epigastric region.  EXAM: CT ABDOMEN WITHOUT CONTRAST  TECHNIQUE: Multidetector CT imaging of the abdomen was performed following the standard protocol without IV contrast.  COMPARISON:  10/07/2014 abdominal radiograph  FINDINGS: Lower chest: Posterior  loculated bilateral pleural effusions noted with bibasilar consolidations/pneumonia. Right chest PleurX drain in place. Prior median sternotomy noted. Normal heart size. No pericardial effusion. NG tube within the stomach.  Abdomen: The stomach is posterior to the anteriorly positioned colon as well as the left hepatic lobe. There does not appear to be an adequate percutaneous window for fluoroscopic percutaneous gastrostomy. Therefore, recommend surgical consultation for operative gastrostomy.  Cholelithiasis evident. Incidental punctate calcified hepatic granuloma. No biliary dilatation. Biliary system, pancreas, spleen, accessory splenule, and adrenal glands are within normal limits for age and noncontrast imaging. Kidneys demonstrate no hydronephrosis or renal obstruction. Left kidney has a upper pole parapelvic cyst measuring 28 mm, image 33.  Diffuse flank region subcutaneous edema noted compatible with anasarca.  Negative for bowel obstruction, dilatation, ileus, or free air.  Aortic atherosclerosis noted without aneurysm.  No acute osseous finding.  IMPRESSION: Stomach is deep to the anteriorly positioned high colon as well as the left hepatic lobe. Therefore fluoroscopic percutaneous gastrostomy cannot be performed.  Bilateral loculated pleural effusions with basilar consolidation/pneumonia, worse on the right  Right PleurX drain in good position.  Cholelithiasis  Anasarca   Electronically Signed   By: Judie Petit.  Shick M.D.   On: 11/02/2014 15:54   Ir Fluoro Rm 30-60 Min  11/07/2014   CLINICAL DATA:  74 year old male with chronic respiratory insufficiency, and dysphagia, referred for percutaneous gastrostomy tube.  The CT completed 11/02/2014 demonstrates difficult window for percutaneous gastrostomy placement with both colon and small bowel overlying the stomach wall.  The patient will be brought IR for attempted percutaneous gastrostomy tube, with documentation of presence of a safe window.  EXAM: Limited  fluoroscopy study  Date:  6/7/20166/12/2014 11:58 am  FLUOROSCOPY TIME:  2 minutes 6 seconds  MEDICATIONS AND MEDICAL HISTORY: None  ANESTHESIA/SEDATION: None  CONTRAST:  None  COMPLICATIONS: None  PROCEDURE: Informed consent was obtained from the patient following explanation of the procedure, risks, benefits and alternatives. The patient understands, agrees and consents for the procedure. All questions were addressed. A time out was performed.  The patient is position in the supine position on the fluoroscopy table, and using fluoroscopy guidance, a multipurpose catheter was for red added through the esophagus via the oral cavity, into the stomach. This was confirmed with fluoroscopy and infusion of air.  Under fluoroscopic imaging, the stomach was well distended by infusing air, in an attempt to depress small bowel and colon below the stomach cavity.  Both AP and lateral images demonstrate interposed colon/small bowel between the stomach wall and the anterior abdominal wall.  No attempt was made, with the risk of bowel perforation.  Patient remained hemodynamically stable throughout was transported to his room after the procedure.  IMPRESSION: Status post fluoroscopic study for attempted percutaneous gastrostomy tube, with no safe window identified. There is small bowel and colon interposed between the abdominal wall and stomach wall with significant distention of the stomach.  Signed,  Yvone Neu. Loreta Ave DO  [facility]  [facility]  PLAN: Consultation for possible surgical placement may be considered.   Electronically Signed   By: Gilmer Mor D.O.   On: 11/07/2014 12:15   Dg Chest Port 1 View  11/14/2014   CLINICAL DATA:  Acute respiratory failure.  EXAM: PORTABLE CHEST - 1 VIEW  COMPARISON:  11/11/2014 and 11/05/2014  FINDINGS: Tracheostomy tube, NG tube, and PICC in place, unchanged. Heart size and vascularity are normal. Persistent small to moderate bilateral  pleural effusions. No pulmonary infiltrates.  IMPRESSION: No change.  Persistent effusions.   Electronically Signed   By: Francene Boyers M.D.   On: 11/14/2014 07:46   Dg Chest Port 1 View  11/11/2014   CLINICAL DATA:  Respiratory failure  EXAM: PORTABLE CHEST - 1 VIEW  COMPARISON:  11/05/2014  FINDINGS: Cardiac shadow is stable. Postsurgical changes are again noted. A tracheostomy tube and nasogastric catheter are noted in satisfactory position. The PICC line is noted in the mid right atrium but stable from the prior exam. Persistent right-sided effusion is noted. A small left effusion is also seen. A PleurX catheter is noted in place.  IMPRESSION: Persistent right-sided pleural effusion displaced PleurX catheter placement.  Small pleural effusion on the left.   Electronically Signed   By: Alcide Clever M.D.   On: 11/11/2014 07:17   Dg Chest Port 1 View  11/05/2014   CLINICAL DATA:  Pneumonia, hypertension, GERD  EXAM: PORTABLE CHEST - 1 VIEW  COMPARISON:  Portable exam 0943 hours compared to 10/31/2014  FINDINGS: Tip of tracheostomy tube projects 6.3 cm above carina.  Nasogastric tube extends into stomach.  RIGHT arm PICC line tip projects over cavoatrial junction.  Normal heart size post CABG.  Slight vascular congestion.  Scattered interstitial infiltrate in the mid to lower RIGHT lung, less on LEFT, question asymmetric edema versus less likely infection.  Small RIGHT pleural effusion.  No pneumothorax.  Underlying COPD changes.  IMPRESSION: COPD changes with question asymmetric edema and RIGHT pleural effusion.   Electronically Signed   By: Ulyses Southward M.D.   On: 11/05/2014 11:32   Dg Chest Port 1 View  10/31/2014   CLINICAL DATA:  Respiratory failure  EXAM: PORTABLE CHEST - 1 VIEW  COMPARISON:  Study obtained earlier in the day  FINDINGS: Tracheostomy catheter tip is 5.5 cm above the carina. There is a chest tube on the right. Nasogastric tube tip and side port are below the diaphragm. Central catheter  tip is in the superior cava. No pneumothorax. There are bilateral pleural effusions. There is evidence suggesting underlying emphysematous change. There is consolidation medial left base, stable. No new opacity. Heart size is normal. Pulmonary vascular is normal. No adenopathy.  IMPRESSION: Tube and catheter positions as described without pneumothorax. Small effusions bilaterally, stable. Consolidation medial left base. Underlying emphysema.   Electronically Signed   By: Bretta Bang III M.D.   On: 10/31/2014 17:47   Dg Chest Port 1 View  10/31/2014   CLINICAL DATA:  Shortness of breath, tracheostomy, pleural effusion  EXAM: PORTABLE CHEST - 1 VIEW  COMPARISON:  Portable chest x-ray of Oct 27, 2014  FINDINGS: The lungs are mildly hyperinflated. There is an improved appearance of the left lung base but persistent subsegmental atelectasis is suspected. There are small bilateral pleural effusions. The cardiac silhouette and pulmonary vascularity are normal.  The patient has undergone previous CABG. The tracheostomy appliance tip projects at the level of the inferior margin of the clavicular heads. The esophagogastric tube tip projects below the inferior margin of the image. The right-sided PICC line tip projects over the midportion of the SVC. There are 7 intact sternal wires from previous median sternotomy.  IMPRESSION: COPD. Improved aeration of both lungs with persistent small bilateral pleural effusions. Right lower lobe atelectasis persists as well. The support tubes and lines are in reasonable position.   Electronically Signed   By: David  [place] M.D.   On: 10/31/2014 07:55   Dg Chest [facility]  1 View  10/27/2014   CLINICAL DATA:  Left-sided pleural effusion.  EXAM: PORTABLE CHEST - 1 VIEW  COMPARISON:  10/25/2014  FINDINGS: Tracheostomy tube and nasogastric tube unchanged. Right-sided PICC line with tip at the cavoatrial junction unchanged. Stable elevation of the left hemidiaphragm. Opacification over the  medial left base and retrocardiac region with minimal blunting of the left costophrenic angle likely effusion with associated atelectasis although cannot exclude infection in the left base. Resolved right basilar opacification. Cardiomediastinal silhouette and remainder of the exam is unchanged.  IMPRESSION: Persistent opacification in the left base/ retrocardiac region likely small effusion with atelectasis as cannot exclude infection in the left base.  Tubes and lines as described.   Electronically Signed   By: Elberta Fortis M.D.   On: 10/27/2014 15:49   Dg Chest Port 1 View  10/25/2014   CLINICAL DATA:  Respiratory abnormality, pleural effusion  EXAM: PORTABLE CHEST - 1 VIEW  COMPARISON:  10/19/2014  FINDINGS: Evidence of CABG noted with mild central vascular congestion. Increased patchy bibasilar airspace opacities are noted with small pleural effusions. Bilateral pleural catheters are in place. Tracheostomy tube is appropriately positioned. Nasogastric tube tip terminates below the level of the hemidiaphragms but is not included in the field of view. Right-sided PICC line in place with tip over the cavoatrial junction. No pneumothorax.  IMPRESSION: Increased hazy bibasilar airspace opacities which could reflect posteriorly tracking pleural fluid, increased since previously, although edema or other alveolar filling process could appear similar.   Electronically Signed   By: Christiana Pellant M.D.   On: 10/25/2014 11:06   Dg Chest Port 1 View  10/19/2014   CLINICAL DATA:  CHF  EXAM: PORTABLE CHEST - 1 VIEW  COMPARISON:  10/13/2014  FINDINGS: Cardiac shadow is stable. Postoperative changes are again seen. PleurX catheters are identified bilaterally. No pneumothorax is noted. No sizable effusion is seen. Some persistent left basilar atelectasis is noted. Improved aeration in the right base is noted. A right-sided PICC line is noted in satisfactory position. Tracheostomy tube and nasogastric catheter are also  noted in satisfactory position.  IMPRESSION: Stable tubes and lines as described above.  Persistent left basilar changes.   Electronically Signed   By: Alcide Clever M.D.   On: 10/19/2014 07:34     EKG on 11/16/2014 showed normal sinus rhythm with nonspecific ST and T-wave changes, heart rate 99 bpm.   Assessment & Plan   1. Ventilator dependent Respiratory failure ; status post tracheostomy on 09/26/2014. Slow weaning still on ATC trials 6 hours done today. 2. Coronary artery disease status post CABG on 09/13/2014 and atrial fibrillation with rapid ventricular rate status post DC conversion with an episode of tampon at and cardiac arrest. Last echo done on 10/03/2014 with ejection fraction of 50% and improvement of mitral regurgitation. Status post LVAD removal on 09/20/2014 for hemolysis . The patient has been stable clinically during his stay in our hospital with normal sinus rhythm. 3. Protein calorie malnutrition on NG tube feedings since April 2016 4. Pleural effusions with Pleurx  catheter on the right, chronic, status post removal of left Pleurx 5. Renal failure , resolved 6 diabetes mellitus 7. Hypotension; resolved 8. Atrial fibrillation; on amiodarone, now in normal sinus rhythm  Plan    Code Status full   @SIGNATURE @

## 2014-11-17 LAB — COMPREHENSIVE METABOLIC PANEL
ALBUMIN: 2.9 g/dL — AB (ref 3.5–5.0)
ALT: 45 U/L (ref 17–63)
ANION GAP: 8 (ref 5–15)
AST: 28 U/L (ref 15–41)
Alkaline Phosphatase: 104 U/L (ref 38–126)
BUN: 56 mg/dL — AB (ref 6–20)
CALCIUM: 8.9 mg/dL (ref 8.9–10.3)
CO2: 39 mmol/L — ABNORMAL HIGH (ref 22–32)
CREATININE: 0.89 mg/dL (ref 0.61–1.24)
Chloride: 87 mmol/L — ABNORMAL LOW (ref 101–111)
GFR calc non Af Amer: >60 mL/min (ref >60)
Glucose, Bld: 150 mg/dL — ABNORMAL HIGH (ref 65–99)
Potassium: 4.3 mmol/L (ref 3.5–5.1)
Sodium: 134 mmol/L — ABNORMAL LOW (ref 135–145)
TOTAL PROTEIN: 6 g/dL — AB (ref 6.5–8.1)
Total Bilirubin: 0.9 mg/dL (ref 0.3–1.2)

## 2014-11-17 LAB — CBC
HEMATOCRIT: 38.9 % — AB (ref 39.0–52.0)
Hemoglobin: 12.4 g/dL — ABNORMAL LOW (ref 13.0–17.0)
MCH: 32.2 pg (ref 26.0–34.0)
MCHC: 31.9 g/dL (ref 30.0–36.0)
MCV: 101 fL — AB (ref 78.0–100.0)
Platelets: 202 10*3/uL (ref 150–400)
RBC: 3.85 MIL/uL — ABNORMAL LOW (ref 4.22–5.81)
RDW: 17 % — ABNORMAL HIGH (ref 11.5–15.5)
WBC: 10.5 10*3/uL (ref 4.0–10.5)

## 2014-11-17 LAB — PROTIME-INR
INR: 1.04 (ref 0.00–1.49)
Prothrombin Time: 13.8 seconds (ref 11.6–15.2)

## 2014-11-19 LAB — CBC
HEMATOCRIT: 42.5 % (ref 39.0–52.0)
Hemoglobin: 13.7 g/dL (ref 13.0–17.0)
MCH: 32.2 pg (ref 26.0–34.0)
MCHC: 32.2 g/dL (ref 30.0–36.0)
MCV: 99.8 fL (ref 78.0–100.0)
Platelets: 220 10*3/uL (ref 150–400)
RBC: 4.26 MIL/uL (ref 4.22–5.81)
RDW: 16.7 % — ABNORMAL HIGH (ref 11.5–15.5)
WBC: 10.3 10*3/uL (ref 4.0–10.5)

## 2014-11-19 LAB — BASIC METABOLIC PANEL
Anion gap: 10 (ref 5–15)
BUN: 74 mg/dL — ABNORMAL HIGH (ref 6–20)
CHLORIDE: 90 mmol/L — AB (ref 101–111)
CO2: 36 mmol/L — ABNORMAL HIGH (ref 22–32)
CREATININE: 1.09 mg/dL (ref 0.61–1.24)
Calcium: 9 mg/dL (ref 8.9–10.3)
GFR calc Af Amer: >60 mL/min (ref >60)
GFR calc non Af Amer: >60 mL/min (ref >60)
Glucose, Bld: 139 mg/dL — ABNORMAL HIGH (ref 65–99)
Potassium: 3.8 mmol/L (ref 3.5–5.1)
Sodium: 136 mmol/L (ref 135–145)

## 2014-11-20 ENCOUNTER — Other Ambulatory Visit (HOSPITAL_COMMUNITY): Payer: Self-pay

## 2014-11-21 NOTE — Progress Notes (Signed)
Name: [name] MRN: [id number] DOB: 10-05-1940    ADMISSION DATE:  10/05/2014 CONSULTATION DATE:  10/06/14  REFERRING MD :  Dr. Sharyon Medicus   CHIEF COMPLAINT:  Acute Respiratory Failure, Bilateral Pleural Effusions s/p bilateral PleurX   BRIEF PATIENT DESCRIPTION: 74 y/o M with recent prolonged admission for STEMI s/p PCI, CABG with subsequent cardiogenic shock.  He is s/p trach/PEG placement.  Tx to [facility] 5/5 for further weaning / rehab efforts.    STUDIES: 5/03 Echo >> EF 50%, grade 2 diastolic dysfx  SUBJECTIVE: oob to chair , exercising arms with PT  VITAL SIGNS: 128/72  100  18 98.7 99%  PHYSICAL EXAMINATION: General:chr  ill appearing Neuro: RASS 0. Speaks and interacts HEENT: trach in place Cardiovascular:  Regular s1 s2 RR distant Lungs: scattered crackles bases reduced rt base Abdomen: non tender Ext: no edema    CMP Latest Ref Rng 11/19/2014 11/17/2014 11/14/2014  Glucose 65 - 99 mg/dL 889(V) 694(H) 038(U)  BUN 6 - 20 mg/dL 82(C) 00(L) 49(Z)  Creatinine 0.61 - 1.24 mg/dL 7.91 5.05 6.97  Sodium 135 - 145 mmol/L 136 134(L) 140  Potassium 3.5 - 5.1 mmol/L 3.8 4.3 4.0  Chloride 101 - 111 mmol/L 90(L) 87(L) 94(L)  CO2 22 - 32 mmol/L 36(H) 39(H) 39(H)  Calcium 8.9 - 10.3 mg/dL 9.0 8.9 9.4(I)  Total Protein 6.5 - 8.1 g/dL - 6.0(L) -  Total Bilirubin 0.3 - 1.2 mg/dL - 0.9 -  Alkaline Phos 38 - 126 U/L - 104 -  AST 15 - 41 U/L - 28 -  ALT 17 - 63 U/L - 45 -    CBC Latest Ref Rng 11/19/2014 11/17/2014 11/13/2014  WBC 4.0 - 10.5 K/uL 10.3 10.5 9.6  Hemoglobin 13.0 - 17.0 g/dL 01.6 12.4(L) 9.9(L)  Hematocrit 39.0 - 52.0 % 42.5 38.9(L) 31.6(L)  Platelets 150 - 400 K/uL 220 202 188    No results found.  CXR 6/14 BL effusions  ASSESSMENT 6 XLT trach 5/31 >>  Acute on chronic respiratory failure with failure to wean from vent s/p tracheostomy. Hx of COPD. B/l pleural effusions s/p b/l pleurx catheter placement 5/05. Plan: - Increase ATC duration as tolerated - now  at 48 hours - negative fluid balance as tolerated - drain pleural fluid  prn - f/u CXR next week - pulmicort, duoneb -consider discussions on his wishes -can taper prednisone to off over next 2 weeks   CAD s/p CABG. Deconditioning. Protein calorie malnutrition. Hypotension. Anemia of chronic disease. Plan: - per primary team  Dysphagia. Plan: Per PCP  [name] ACNP [name] PCCM Pager [phone] till 3 pm If no answer page [phone] 11/21/2014, 11:32 AM  Attending note:   74 year old man well known to our service from prolonged hospitalization in the surgical ICU presenting to [facility], trached on vent. I reviewed his CXR myself, bilateral pleural effusion but present and drains are out. Discussed with NP and Dr. [name] ([facility]-MD).  Acute respiratory failure s/p critical illness and cardiac surgery: - Maintain on TC as able.  - Patient maybe one to consider nocturnal ventilation on to keep off vent during the day.  Will evaluate how long he lasts prior to making that decision..  Tracheostomy status: - Trach in place, may remove sutures after 7 days. - Would not recommend decreasing size at this point until able to tolerate TC.  Bilateral pleural effusion likely related to critical illness, low protein and fluid shifting. - D/Ced chest tube. - CXR  as needed. - Will hold off any thoracentesis for now.  COPD history: - Pulmicort as ordered. - PRN albuterol. - Combivent as ordered.  Deconditioning: severe due to chronic illness. - OOB to chair as able even with vent. - PT as ordered.  Protein calorie malnutrition, severe, a large culprit in recurrences of pleural effusion. - Nutritionist assistance with TF. - Measure pre-albumin.  Patient seen and examined, agree with above note. I dictated the care and orders  written for this patient under my direction.  Alyson Reedy, MD [phone]

## 2014-11-23 ENCOUNTER — Other Ambulatory Visit (HOSPITAL_COMMUNITY): Payer: Self-pay

## 2014-11-23 LAB — BASIC METABOLIC PANEL
ANION GAP: 11 (ref 5–15)
BUN: 77 mg/dL — AB (ref 6–20)
CHLORIDE: 85 mmol/L — AB (ref 101–111)
CO2: 35 mmol/L — AB (ref 22–32)
Calcium: 8.9 mg/dL (ref 8.9–10.3)
Creatinine, Ser: 1.18 mg/dL (ref 0.61–1.24)
GFR calc Af Amer: >60 mL/min (ref >60)
GLUCOSE: 177 mg/dL — AB (ref 65–99)
POTASSIUM: 2.6 mmol/L — AB (ref 3.5–5.1)
Sodium: 131 mmol/L — ABNORMAL LOW (ref 135–145)

## 2014-11-23 LAB — CBC
HEMATOCRIT: 38.8 % — AB (ref 39.0–52.0)
HEMOGLOBIN: 12.9 g/dL — AB (ref 13.0–17.0)
MCH: 32.3 pg (ref 26.0–34.0)
MCHC: 33.2 g/dL (ref 30.0–36.0)
MCV: 97 fL (ref 78.0–100.0)
PLATELETS: 192 10*3/uL (ref 150–400)
RBC: 4 MIL/uL — ABNORMAL LOW (ref 4.22–5.81)
RDW: 15.9 % — ABNORMAL HIGH (ref 11.5–15.5)
WBC: 7.5 10*3/uL (ref 4.0–10.5)

## 2014-11-23 NOTE — Progress Notes (Signed)
Name: [name] MRN: [id number] DOB: 11/30/1940    ADMISSION DATE:  10/05/2014 CONSULTATION DATE:  10/06/14  REFERRING MD :  Dr. Sharyon Medicus   CHIEF COMPLAINT:  Acute Respiratory Failure, Bilateral Pleural Effusions s/p bilateral PleurX   BRIEF PATIENT DESCRIPTION: 74 y/o M with recent prolonged admission for STEMI s/p PCI, CABG with subsequent cardiogenic shock.  He is s/p trach/PEG placement.  Tx to [facility] 5/5 for further weaning / rehab efforts.    STUDIES: 5/03 Echo >> EF 50%, grade 2 diastolic dysfx  SUBJECTIVE: oob to chair   VITAL SIGNS: 98.4 90 20 130/72 100%  PHYSICAL EXAMINATION: General:chr  ill appearing Neuro: RASS 1. Speaks and interacts HEENT: trach in place Cardiovascular:  Regular s1 s2 RR distant Lungs:  reduced rt base Abdomen: non tender Ext: no edema    CMP Latest Ref Rng 11/23/2014 11/19/2014 11/17/2014  Glucose 65 - 99 mg/dL 213(Y) 865(H) 846(N)  BUN 6 - 20 mg/dL 62(X) 52(W) 41(L)  Creatinine 0.61 - 1.24 mg/dL 2.44 0.10 2.72  Sodium 135 - 145 mmol/L 131(L) 136 134(L)  Potassium 3.5 - 5.1 mmol/L 2.6(LL) 3.8 4.3  Chloride 101 - 111 mmol/L 85(L) 90(L) 87(L)  CO2 22 - 32 mmol/L 35(H) 36(H) 39(H)  Calcium 8.9 - 10.3 mg/dL 8.9 9.0 8.9  Total Protein 6.5 - 8.1 g/dL - - 6.0(L)  Total Bilirubin 0.3 - 1.2 mg/dL - - 0.9  Alkaline Phos 38 - 126 U/L - - 104  AST 15 - 41 U/L - - 28  ALT 17 - 63 U/L - - 45    CBC Latest Ref Rng 11/23/2014 11/19/2014 11/17/2014  WBC 4.0 - 10.5 K/uL 7.5 10.3 10.5  Hemoglobin 13.0 - 17.0 g/dL 12.9(L) 13.7 12.4(L)  Hematocrit 39.0 - 52.0 % 38.8(L) 42.5 38.9(L)  Platelets 150 - 400 K/uL 192 220 202    Dg Chest Port 1 View  11/23/2014   CLINICAL DATA:  Respiratory failure  EXAM: PORTABLE CHEST - 1 VIEW  COMPARISON:  11/14/2014  FINDINGS: Tracheostomy tube is again noted in satisfactory position. A right-sided PICC line is again seen in stable position. The nasogastric catheter has been removed. The right lung remains clear. Persistent  left basilar atelectasis and small effusion are noted. Postsurgical changes are seen. The cardiac shadow is stable. Old rib fractures are noted on the right anteriorly.  IMPRESSION: Left basilar changes stable from the prior exam.  No new focal abnormality is seen.   Electronically Signed   By: Alcide Clever M.D.   On: 11/23/2014 07:52    CXR 6/14 BL effusions  ASSESSMENT 6 XLT trach 5/31 >>  Acute on chronic respiratory failure with failure to wean from vent s/p tracheostomy. Hx of COPD. B/l pleural effusions s/p b/l pleurx catheter placement 5/05. Plan: - Increase ATC duration as tolerated - now at 24/7, we will Q weekly - negative fluid balance as tolerated - drain pleural fluid  prn - f/u CXR next week - pulmicort, duoneb -consider discussions on his wishes -can taper prednisone to off over next 2 weeks   CAD s/p CABG. Deconditioning. Protein calorie malnutrition. Hypotension. Anemia of chronic disease. Plan: - per primary team  Dysphagia. Plan: Per PCP  [name] ACNP [name] PCCM Pager [phone] till 3 pm If no answer page [phone] 11/23/2014, 10:12 AM  Attending note:   74 year old man well known to our service from prolonged hospitalization in the surgical ICU presenting to [facility], trached on vent. I reviewed his  CXR myself, bilateral pleural effusion but present and drains are out. Discussed with NP and Dr. [name] ([facility]-MD).  Acute respiratory failure s/p critical illness and cardiac surgery: - Maintain on TC as able. - Patient maybe one to consider nocturnal ventilation on to keep off vent during the day. So far holding on TC though.    Tracheostomy status: - Change to cuffless 6 XLT, order placed.  Bilateral pleural effusion likely related to critical illness, low protein and fluid shifting. - D/Ced chest tube. - CXR ordered for Monday. - Will hold off any thoracentesis for  now.  COPD history: - Pulmicort as ordered. - PRN albuterol. - Combivent as ordered.  Deconditioning: severe due to chronic illness. - OOB to chair as able even with vent. - PT as ordered.  Protein calorie malnutrition, severe, a large culprit in recurrences of pleural effusion. - Nutritionist assistance with TF. - Measure pre-albumin.  Patient seen and examined, agree with above note. I dictated the care and orders written for this patient under my direction.  Alyson Reedy, MD [phone]

## 2014-11-24 LAB — BASIC METABOLIC PANEL
ANION GAP: 12 (ref 5–15)
BUN: 75 mg/dL — AB (ref 6–20)
CALCIUM: 9.5 mg/dL (ref 8.9–10.3)
CO2: 30 mmol/L (ref 22–32)
CREATININE: 1.31 mg/dL — AB (ref 0.61–1.24)
Chloride: 91 mmol/L — ABNORMAL LOW (ref 101–111)
GFR, EST NON AFRICAN AMERICAN: 53 mL/min — AB (ref >60)
Glucose, Bld: 182 mg/dL — ABNORMAL HIGH (ref 65–99)
POTASSIUM: 4.8 mmol/L (ref 3.5–5.1)
SODIUM: 133 mmol/L — AB (ref 135–145)

## 2014-11-24 LAB — POTASSIUM: Potassium: 5.3 mmol/L — ABNORMAL HIGH (ref 3.5–5.1)

## 2014-11-25 LAB — BASIC METABOLIC PANEL
Anion gap: 13 (ref 5–15)
BUN: 73 mg/dL — ABNORMAL HIGH (ref 6–20)
CALCIUM: 9.4 mg/dL (ref 8.9–10.3)
CO2: 33 mmol/L — AB (ref 22–32)
Chloride: 88 mmol/L — ABNORMAL LOW (ref 101–111)
Creatinine, Ser: 1.3 mg/dL — ABNORMAL HIGH (ref 0.61–1.24)
GFR calc non Af Amer: 54 mL/min — ABNORMAL LOW (ref >60)
GLUCOSE: 71 mg/dL (ref 65–99)
Potassium: 4.2 mmol/L (ref 3.5–5.1)
SODIUM: 134 mmol/L — AB (ref 135–145)

## 2014-11-25 LAB — DIGOXIN LEVEL: DIGOXIN LVL: 0.5 ng/mL — AB (ref 0.8–2.0)

## 2014-11-25 LAB — BRAIN NATRIURETIC PEPTIDE: B Natriuretic Peptide: 175.2 pg/mL — ABNORMAL HIGH (ref 0.0–100.0)

## 2014-11-26 LAB — BASIC METABOLIC PANEL
ANION GAP: 11 (ref 5–15)
BUN: 71 mg/dL — AB (ref 6–20)
CALCIUM: 9.3 mg/dL (ref 8.9–10.3)
CO2: 36 mmol/L — AB (ref 22–32)
Chloride: 89 mmol/L — ABNORMAL LOW (ref 101–111)
Creatinine, Ser: 1.22 mg/dL (ref 0.61–1.24)
GFR, EST NON AFRICAN AMERICAN: 58 mL/min — AB (ref >60)
Glucose, Bld: 100 mg/dL — ABNORMAL HIGH (ref 65–99)
POTASSIUM: 3.5 mmol/L (ref 3.5–5.1)
Sodium: 136 mmol/L (ref 135–145)

## 2014-11-26 LAB — BRAIN NATRIURETIC PEPTIDE: B NATRIURETIC PEPTIDE 5: 156.5 pg/mL — AB (ref 0.0–100.0)

## 2014-11-28 LAB — BASIC METABOLIC PANEL
Anion gap: 8 (ref 5–15)
BUN: 62 mg/dL — ABNORMAL HIGH (ref 6–20)
CO2: 37 mmol/L — ABNORMAL HIGH (ref 22–32)
Calcium: 8.9 mg/dL (ref 8.9–10.3)
Chloride: 89 mmol/L — ABNORMAL LOW (ref 101–111)
Creatinine, Ser: 1 mg/dL (ref 0.61–1.24)
GFR calc Af Amer: >60 mL/min (ref >60)
GFR calc non Af Amer: >60 mL/min (ref >60)
Glucose, Bld: 79 mg/dL (ref 65–99)
POTASSIUM: 3.9 mmol/L (ref 3.5–5.1)
Sodium: 134 mmol/L — ABNORMAL LOW (ref 135–145)

## 2014-11-28 NOTE — Progress Notes (Signed)
Name: [name]: [id number] DOB: 1940/09/10    ADMISSION DATE:  10/05/2014 CONSULTATION DATE:  10/06/14  REFERRING MD :  Dr. Sharyon Medicus   CHIEF COMPLAINT:  Acute Respiratory Failure, Bilateral Pleural Effusions s/p bilateral PleurX   BRIEF PATIENT DESCRIPTION: 74 y/o M with recent prolonged admission for STEMI s/p PCI, CABG with subsequent cardiogenic shock.  He is s/p trach/PEG placement.  Tx to [facility] 5/5 for further weaning / rehab efforts.    STUDIES: 5/03 Echo >> EF 50%, grade 2 diastolic dysfx  SUBJECTIVE: In bed no distress  VITAL SIGNS: 98.4 90 20 130/72 100%  PHYSICAL EXAMINATION: General:chr  ill appearing Neuro: RASS 1. Speaks and interacts HEENT: trach in place Cardiovascular:  Regular s1 s2 RR distant Lungs:  reduced rt base Abdomen: non tender Ext: no edema    CMP Latest Ref Rng 11/28/2014 11/26/2014 11/25/2014  Glucose 65 - 99 mg/dL 79 213(Y) 71  BUN 6 - 20 mg/dL 86(V) 78(I) 69(G)  Creatinine 0.61 - 1.24 mg/dL 2.95 2.84 1.32(G)  Sodium 135 - 145 mmol/L 134(L) 136 134(L)  Potassium 3.5 - 5.1 mmol/L 3.9 3.5 4.2  Chloride 101 - 111 mmol/L 89(L) 89(L) 88(L)  CO2 22 - 32 mmol/L 37(H) 36(H) 33(H)  Calcium 8.9 - 10.3 mg/dL 8.9 9.3 9.4  Total Protein 6.5 - 8.1 g/dL - - -  Total Bilirubin 0.3 - 1.2 mg/dL - - -  Alkaline Phos 38 - 126 U/L - - -  AST 15 - 41 U/L - - -  ALT 17 - 63 U/L - - -    CBC Latest Ref Rng 11/23/2014 11/19/2014 11/17/2014  WBC 4.0 - 10.5 K/uL 7.5 10.3 10.5  Hemoglobin 13.0 - 17.0 g/dL 12.9(L) 13.7 12.4(L)  Hematocrit 39.0 - 52.0 % 38.8(L) 42.5 38.9(L)  Platelets 150 - 400 K/uL 192 220 202    No results found.  CXR 6/23 nsc  ASSESSMENT 6 XLT trach 5/31 >>  Acute on chronic respiratory failure with failure to wean from vent s/p tracheostomy. Hx of COPD. B/l pleural effusions s/p b/l pleurx catheter placement 5/05. Plan: - Increase ATC duration as tolerated - now at 24/7, we will Q weekly - negative fluid balance as tolerated -  drain pleural fluid  prn - f/u CXR PRN - pulmicort, duoneb -consider discussions on his wishes -can taper prednisone to off over next 2 weeks   CAD s/p CABG. Deconditioning. Protein calorie malnutrition. Hypotension. Anemia of chronic disease. Plan: - per primary team  Dysphagia. Plan: Per PCP  [name] ACNP [name] PCCM Pager [phone] till 3 pm If no answer page [phone] 11/28/2014, 10:24 AM  Attending note:   74 year old man well known to our service from prolonged hospitalization in the surgical ICU presenting to [facility], trached on vent. I reviewed his CXR myself, bilateral pleural effusion but present and drains are out. Discussed with NP and Dr. [name] ([facility]-MD).   Acute respiratory failure s/p critical illness and cardiac surgery:  Capped x24 hours. - Maintain capped for 48 hours.  - If tolerating cap til Thursday then will decannulate.  Tracheostomy status: - Change to cuffless 6 XLT, order placed.  - Cap trach as above.  Bilateral pleural effusion likely related to critical illness, low protein and fluid shifting. - D/Ced chest tube. - CXR ordered for Thursday. - Will hold off any thoracentesis for now.  COPD history: - Pulmicort as ordered. - PRN albuterol. - Combivent as ordered.  Deconditioning: severe due to chronic  illness. - OOB to chair as able even with vent. - PT as ordered.  Protein calorie malnutrition, severe, a large culprit in recurrences of pleural effusion. - Nutritionist assistance with TF. - Measure pre-albumin.  Patient seen and examined, agree with above note. I dictated the care and orders written for this patient under my direction.  [name], MD [phone]

## 2014-11-30 LAB — BLOOD GAS, ARTERIAL
ACID-BASE EXCESS: 10.7 mmol/L — AB (ref 0.0–2.0)
Bicarbonate: 34.9 mEq/L — ABNORMAL HIGH (ref 20.0–24.0)
O2 Content: 2 L/min
O2 Saturation: 97.2 %
Patient temperature: 98
TCO2: 36.4 mmol/L (ref 0–100)
pCO2 arterial: 47.7 mmHg — ABNORMAL HIGH (ref 35.0–45.0)
pH, Arterial: 7.476 — ABNORMAL HIGH (ref 7.350–7.450)
pO2, Arterial: 88.7 mmHg (ref 80.0–100.0)

## 2014-12-04 ENCOUNTER — Other Ambulatory Visit (HOSPITAL_COMMUNITY): Payer: Medicare Other

## 2014-12-04 LAB — COMPREHENSIVE METABOLIC PANEL
ALT: 23 U/L (ref 17–63)
ANION GAP: 9 (ref 5–15)
AST: 18 U/L (ref 15–41)
Albumin: 2.2 g/dL — ABNORMAL LOW (ref 3.5–5.0)
Alkaline Phosphatase: 76 U/L (ref 38–126)
BUN: 60 mg/dL — ABNORMAL HIGH (ref 6–20)
CALCIUM: 8.7 mg/dL — AB (ref 8.9–10.3)
CO2: 35 mmol/L — AB (ref 22–32)
Chloride: 94 mmol/L — ABNORMAL LOW (ref 101–111)
Creatinine, Ser: 0.84 mg/dL (ref 0.61–1.24)
GFR calc non Af Amer: >60 mL/min (ref >60)
Glucose, Bld: 114 mg/dL — ABNORMAL HIGH (ref 65–99)
Potassium: 3.2 mmol/L — ABNORMAL LOW (ref 3.5–5.1)
Sodium: 138 mmol/L (ref 135–145)
Total Bilirubin: 0.5 mg/dL (ref 0.3–1.2)
Total Protein: 5.5 g/dL — ABNORMAL LOW (ref 6.5–8.1)

## 2014-12-04 LAB — DIGOXIN LEVEL: DIGOXIN LVL: 1 ng/mL (ref 0.8–2.0)

## 2014-12-04 LAB — CBC
HCT: 33.5 % — ABNORMAL LOW (ref 39.0–52.0)
Hemoglobin: 10.5 g/dL — ABNORMAL LOW (ref 13.0–17.0)
MCH: 31.4 pg (ref 26.0–34.0)
MCHC: 31.3 g/dL (ref 30.0–36.0)
MCV: 100.3 fL — AB (ref 78.0–100.0)
Platelets: 159 10*3/uL (ref 150–400)
RBC: 3.34 MIL/uL — ABNORMAL LOW (ref 4.22–5.81)
RDW: 15.5 % (ref 11.5–15.5)
WBC: 15.3 10*3/uL — ABNORMAL HIGH (ref 4.0–10.5)

## 2014-12-05 NOTE — Progress Notes (Signed)
   Name: Justin MattocksDennis W Knight MRN: [id number] DOB: [birth year]    ADMISSION DATE:  10/05/2014 CONSULTATION DATE:  10/06/14  REFERRING MD :  Dr. Sharyon MedicusHijazi   CHIEF COMPLAINT:  Acute Respiratory Failure, Bilateral Pleural Effusions s/p bilateral PleurX   BRIEF PATIENT DESCRIPTION: 74 y/o M with recent prolonged admission for STEMI s/p PCI, CABG with subsequent cardiogenic shock.  He is s/p trach/PEG placement.  [facility] for further weaning / rehab efforts.    STUDIES: 5/03 Echo >> EF 50%, grade 2 diastolic dysfx  SUBJECTIVE: No distress.   VITAL SIGNS: 98.1, 16 89/63  98% sats PHYSICAL EXAMINATION: General:chr  ill appearing Neuro: RASS 0, oriented X 2 HEENT: trach dressing intact  Cardiovascular:  Regular s1 s2 RR distant Lungs:  Scattered rhonchi  Abdomen: non tender Ext: no edema    CMP Latest Ref Rng 12/04/2014 11/28/2014 11/26/2014  Glucose 65 - 99 mg/dL 409(W114(H) 79 119(J100(H)  BUN 6 - 20 mg/dL 47(W60(H) 29(F62(H) 62(Z71(H)  Creatinine 0.61 - 1.24 mg/dL 3.080.84 6.571.00 8.461.22  Sodium 135 - 145 mmol/L 138 134(L) 136  Potassium 3.5 - 5.1 mmol/L 3.2(L) 3.9 3.5  Chloride 101 - 111 mmol/L 94(L) 89(L) 89(L)  CO2 22 - 32 mmol/L 35(H) 37(H) 36(H)  Calcium 8.9 - 10.3 mg/dL 9.6(E8.7(L) 8.9 9.3  Total Protein 6.5 - 8.1 g/dL 9.5(M5.5(L) - -  Total Bilirubin 0.3 - 1.2 mg/dL 0.5 - -  Alkaline Phos 38 - 126 U/L 76 - -  AST 15 - 41 U/L 18 - -  ALT 17 - 63 U/L 23 - -    CBC Latest Ref Rng 12/04/2014 11/23/2014 11/19/2014  WBC 4.0 - 10.5 K/uL 15.3(H) 7.5 10.3  Hemoglobin 13.0 - 17.0 g/dL 10.5(L) 12.9(L) 13.7  Hematocrit 39.0 - 52.0 % 33.5(L) 38.8(L) 42.5  Platelets 150 - 400 K/uL 159 192 220    Dg Chest Port 1 View  12/04/2014   CLINICAL DATA:  Respiratory failure.  EXAM: PORTABLE CHEST - 1 VIEW  COMPARISON:  11/23/2014  FINDINGS: The tracheostomy tube is been removed. The right-sided PICC line is stable. The tip is at the cavoatrial junction. Heart size is stable. The right-sided chest tube is stable. No pleural effusion or  pneumothorax. Stable emphysematous changes. Persistent bibasilar atelectasis.  IMPRESSION: Removal of tracheostomy tube. The right PICC line and right-sided chest tubes are stable.  Stable emphysematous changes and streaky areas of atelectasis but no infiltrates or recurrent effusion.   Electronically Signed   By: Rudie MeyerP.  Gallerani M.D.   On: 12/04/2014 07:19    CXR 7/4: ATX but no effusion   ASSESSMENT Resolved trach dependence  Hx of COPD. Dysphagia  B/l pleural effusions s/p b/l pleurx catheter placement 5/05: Now well managed w/ diuresis regimen. Left CT removed 5/27. Output from right chest tube is minimal.  Plan: Wean FIO2 as needed Cont current diuretic regimen  Will d/c right pleur-x tube.  Cont aspiration precautions  CAD s/p CABG. Deconditioning. Protein calorie malnutrition. Hypotension. Anemia of chronic disease. Plan: - per primary team   Simonne MartinetPeter E Keani Gotcher ACNP-BC [facility] Pager # [phone] OR # [phone] if no answer

## 2015-02-01 DEATH — deceased

## 2016-02-20 IMAGING — CR DG ABD PORTABLE 1V
1 series · 1 of 1 positions shown · non-contrast
Comparison: 09/17/2014

CLINICAL DATA: Ileus.

EXAM:
PORTABLE ABDOMEN - 1 VIEW

[AP]
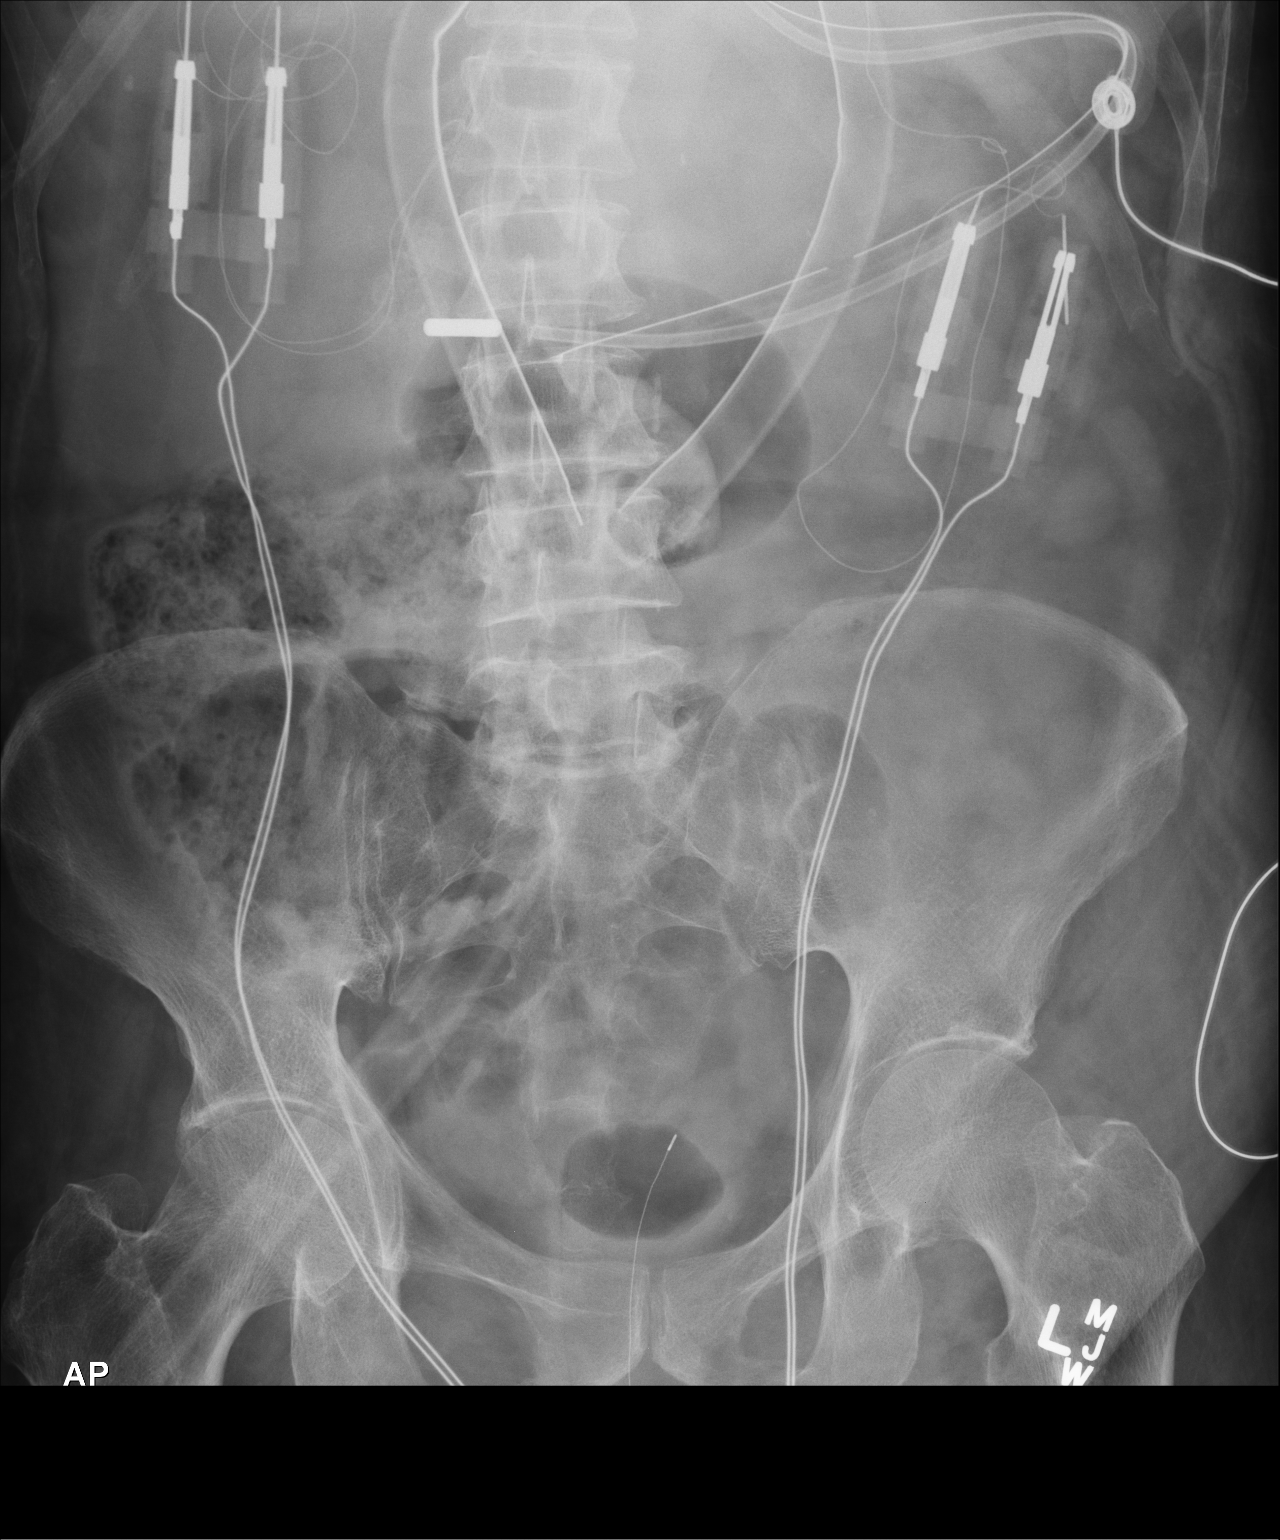

[1 of 1 positions shown; findings below may reference images not displayed]

FINDINGS: Nasogastric tube has tip and side-port over the stomach in the left
upper quadrant to midline abdomen. Double feeding tube is present
with tip just right of midline likely over the distal stomach. The
ends of patient's chest tubes are projected over the mid abdomen.
Small caliber catheter projected over the rectum.

Bowel gas pattern is nonobstructive. Remainder of the exam is
unchanged.
IMPRESSION: Nonobstructive bowel gas pattern.

Tubes and lines as described.

## 2016-02-23 IMAGING — CR DG CHEST 1V PORT
1 series · 1 of 1 positions shown · non-contrast
Comparison: September 23, 2014.

CLINICAL DATA: Chest tubes.

EXAM:
PORTABLE CHEST - 1 VIEW

[AP]
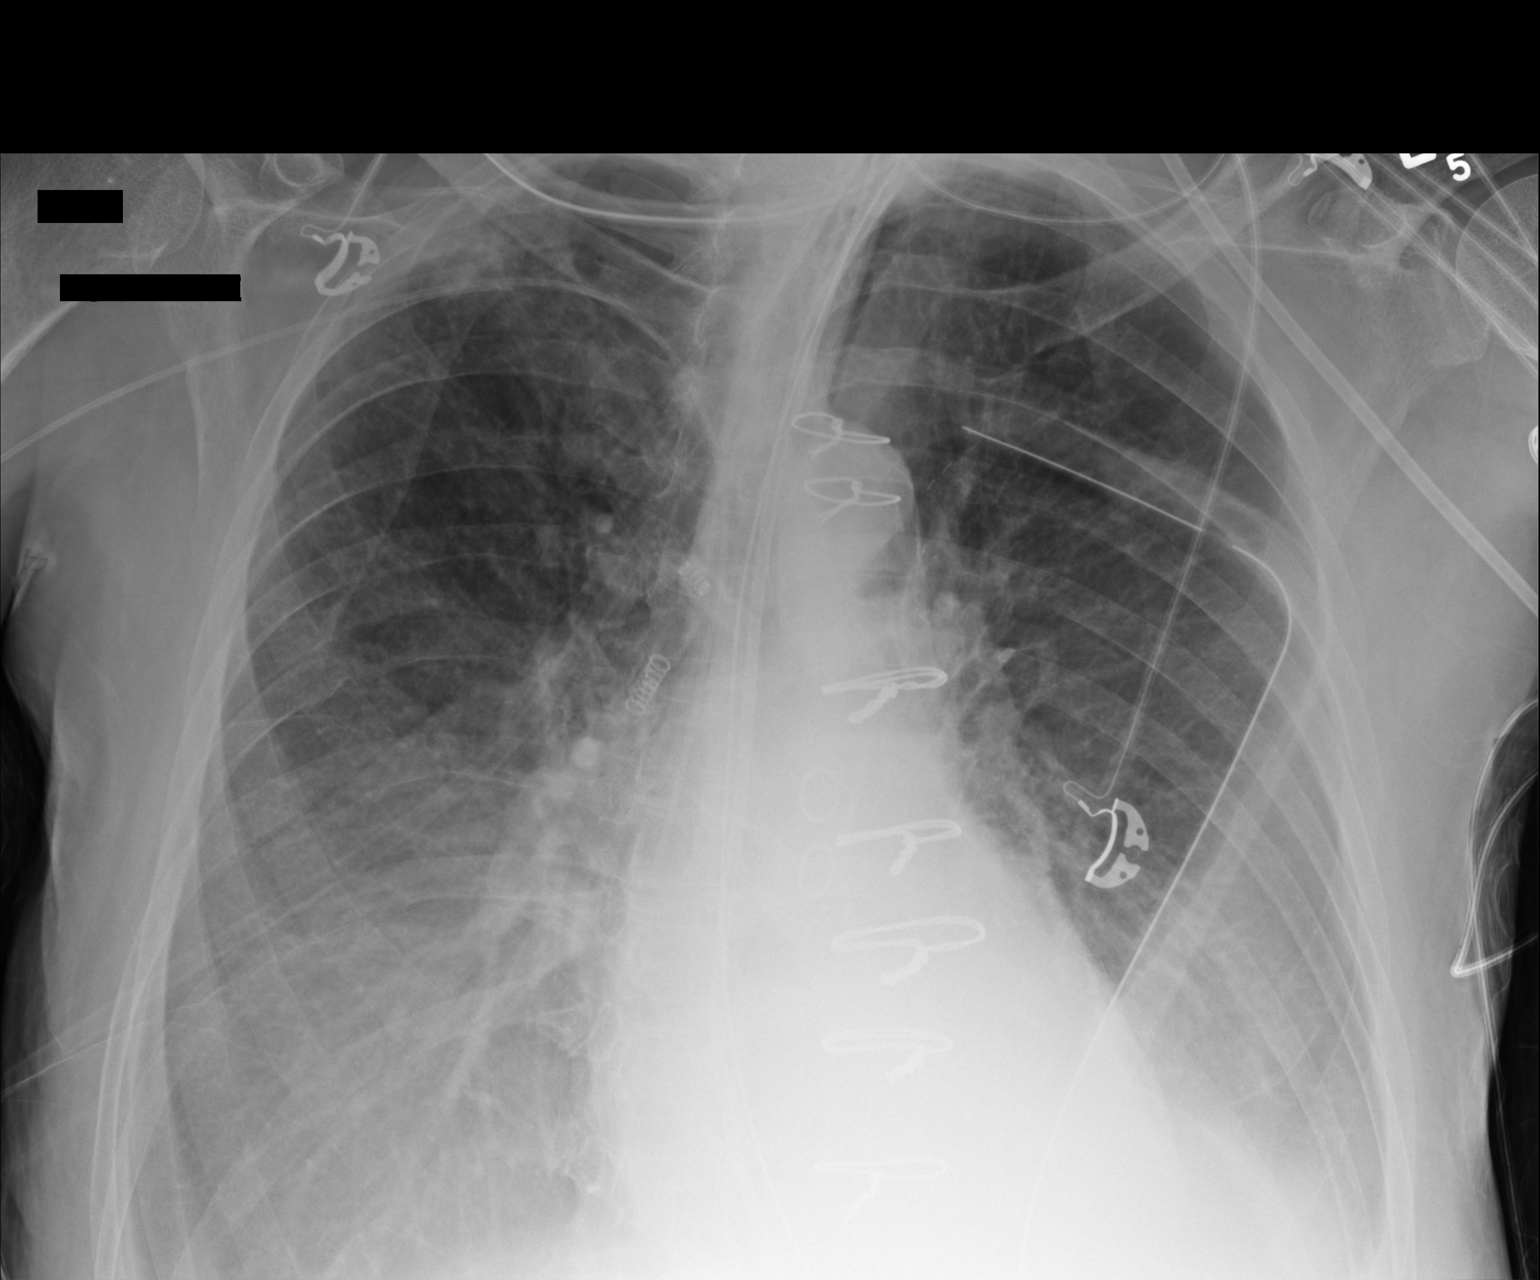

[1 of 1 positions shown; findings below may reference images not displayed]

FINDINGS: Mild bilateral pleural effusions are again noted and stable. Right
subclavian catheter is noted with distal tip overlying expected
position of SVC. Status post coronary artery bypass graft.
Nasogastric and feeding tube are seen entering stomach. Endotracheal
tube is not clearly visualized and potentially has been removed.
IMPRESSION: Left-sided chest tube is noted without pneumothorax. Stable
bibasilar opacities are noted most likely due to combination of
subsegmental atelectasis and mild pleural effusions.

## 2016-02-24 IMAGING — CR DG CHEST 1V PORT
1 series · 1 of 1 positions shown · non-contrast
Comparison: 09/25/2014

CLINICAL DATA: Status post tracheostomy tube insertion in chest
tube insertion

EXAM:
PORTABLE CHEST - 1 VIEW

[AP]
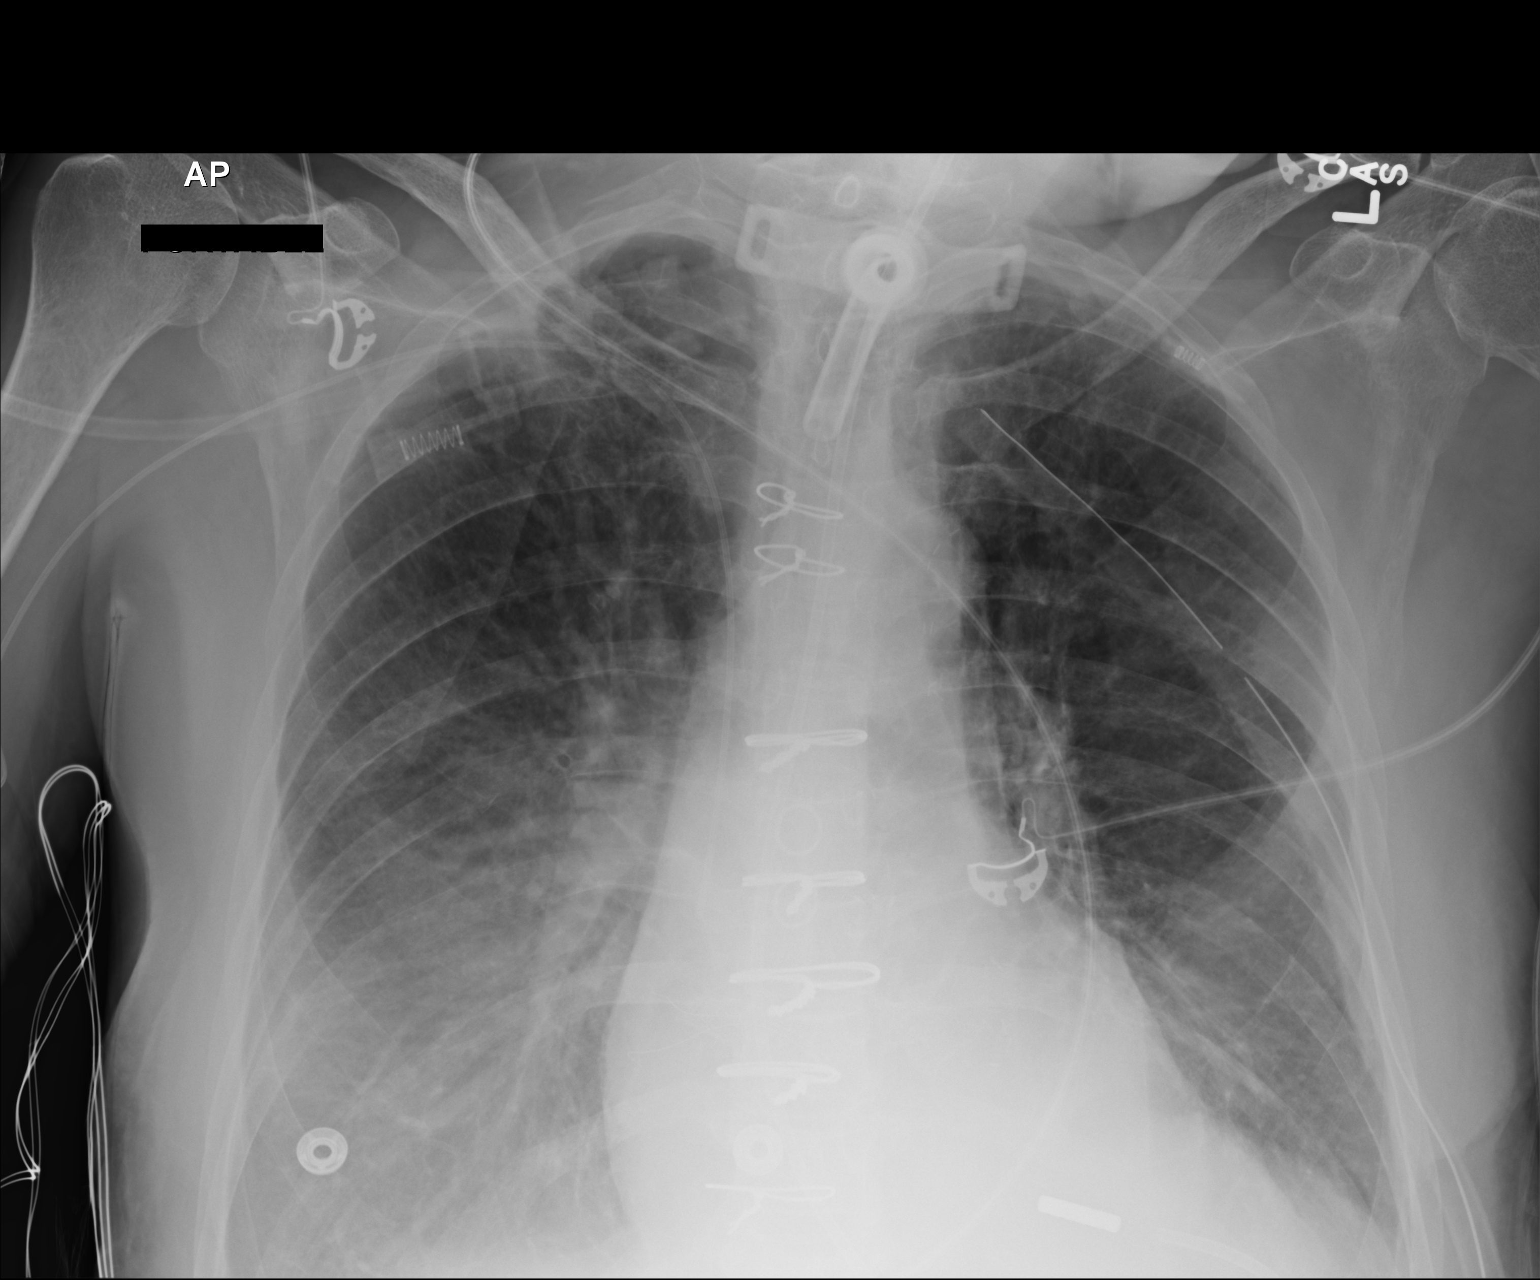

[1 of 1 positions shown; findings below may reference images not displayed]

FINDINGS: Tracheostomy tube tip is midline an above the carina. There is a
right arm PICC line with tip in the cavoatrial junction. Feeding
tube is in place and is coiled within the stomach. There has been
interval repositioning of the left chest tube. No significant
pneumothorax identified. Median sternotomy. The heart size is
normal. Subsegmental atelectasis and small pleural effusions are
again noted.
IMPRESSION: 1. Support apparatus positioned as above.
2. Left chest tube without significant pneumothorax.

## 2016-02-24 IMAGING — CR DG CHEST 1V PORT
2 series · 2 of 2 positions shown · non-contrast
Comparison: 09/25/2014.

CLINICAL DATA: 73-year-old male with dyspnea. For tracheostomy
today. Subsequent encounter.

EXAM:
PORTABLE CHEST - 1 VIEW

[AP (1 of 2)]
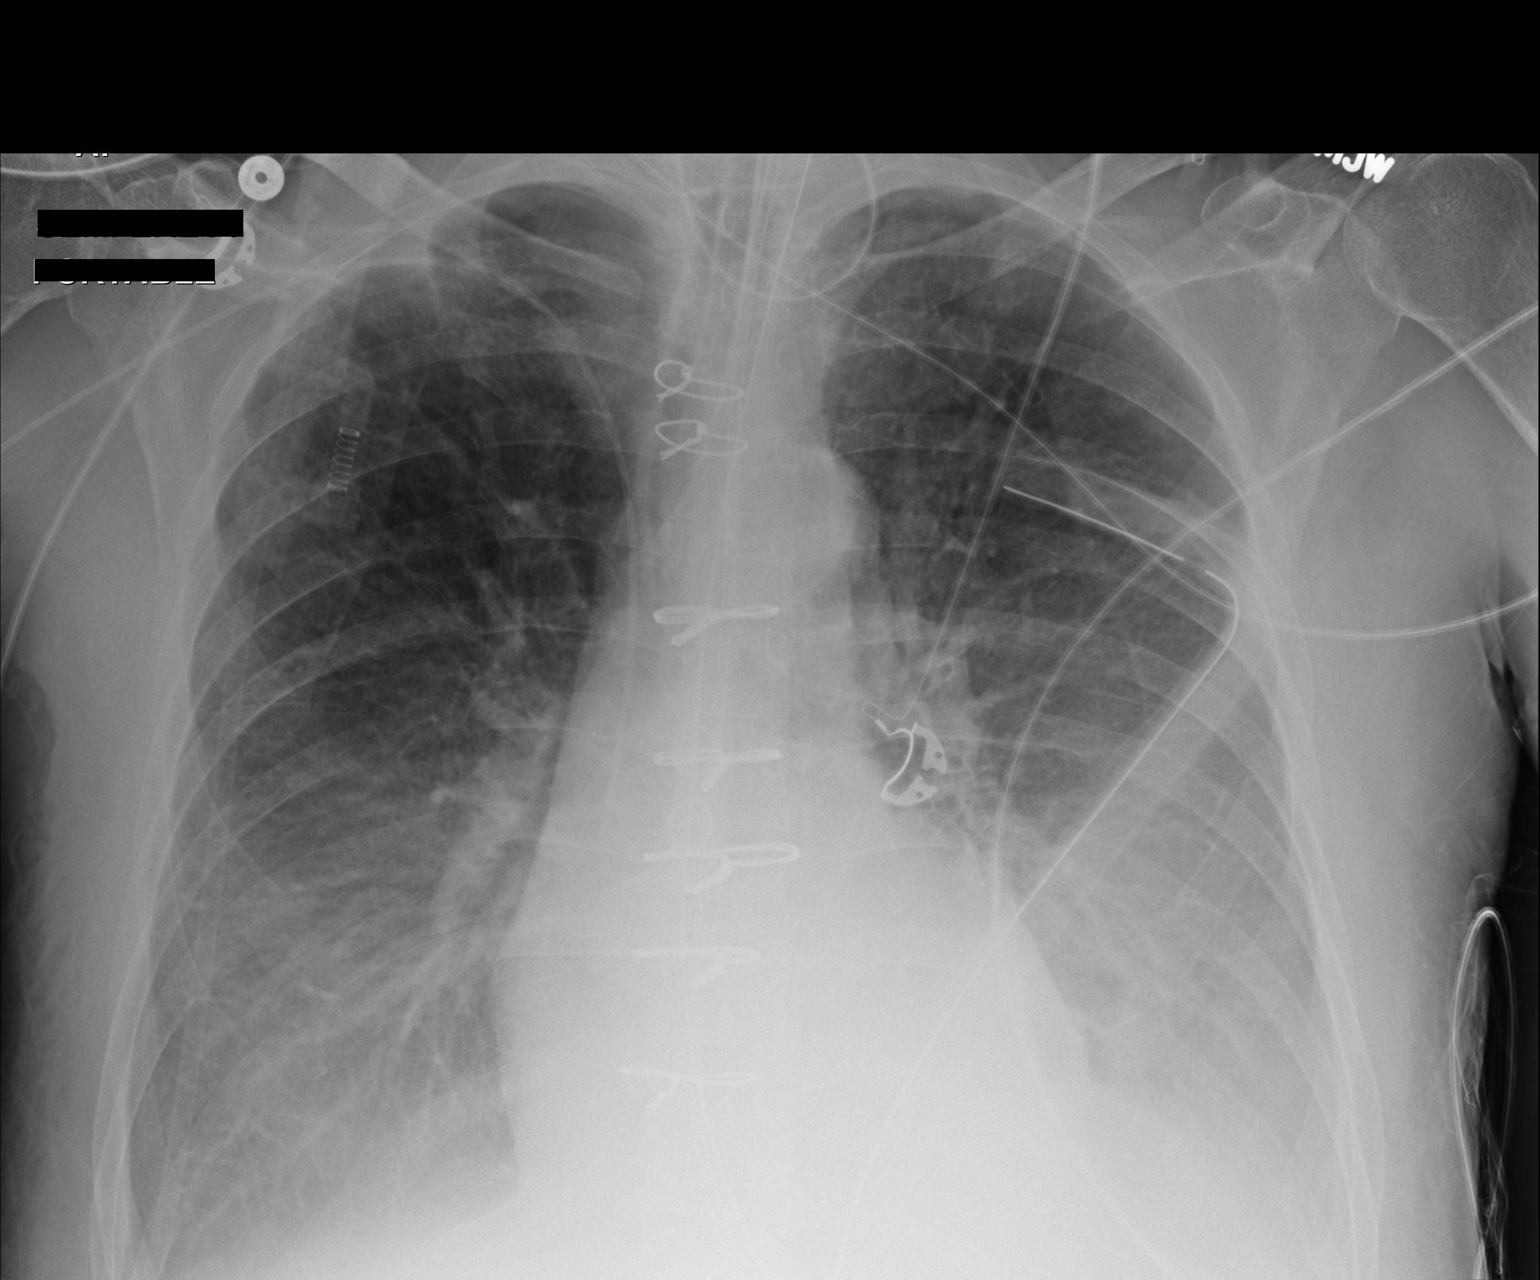

[AP (2 of 2)]
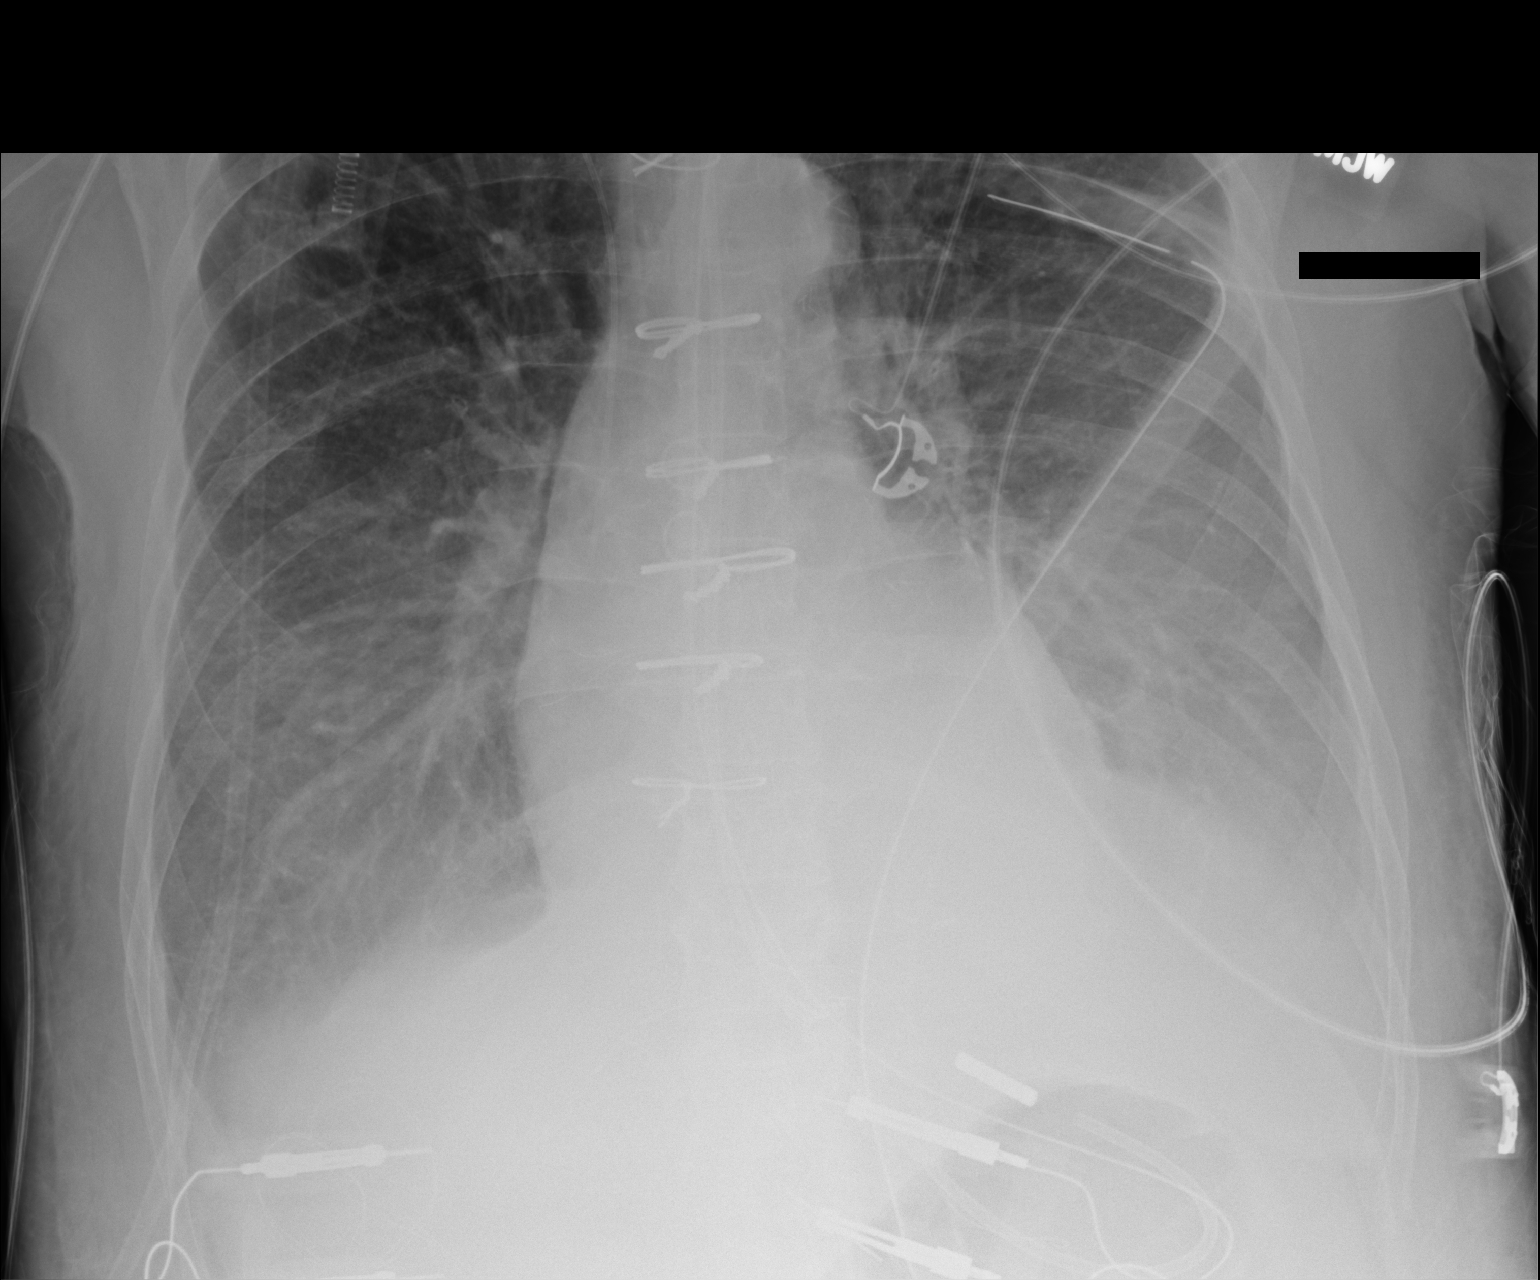

[2 of 2 positions shown; findings below may reference images not displayed]

FINDINGS: Endotracheal tube tip 5.4 cm above the carina.

Right central line tip mid to distal superior vena cava.

Nasogastric tube courses below the diaphragm. Tip is not included on
the present exam.

Left-sided chest tube is in place.  No gross pneumothorax.

Post CABG.  Heart size within normal limits.

Hazy opacification mid lower lung zones may represent posteriorly
layering pleural effusion although basilar infiltrate difficult to
completely exclude in the proper clinical setting. Appearance is
unchanged.

Pulmonary vascular congestion/ mild pulmonary edema unchanged.

Epicardial leads in place.

Calcified aorta
IMPRESSION: Pulmonary vascular congestion/mild pulmonary edema with posteriorly
layering pleural effusions suspected, unchanged from prior exam as
noted above.
